# Patient Record
Sex: Female | Born: 1940 | Hispanic: No | Smoking: Never smoker
Health system: Southern US, Community
[De-identification: ages and names within clinical notes are randomized; demographics above are authoritative.]

## PROBLEM LIST (undated history)

## (undated) DIAGNOSIS — I480 Paroxysmal atrial fibrillation: Secondary | ICD-10-CM

## (undated) DIAGNOSIS — Z9289 Personal history of other medical treatment: Secondary | ICD-10-CM

## (undated) DIAGNOSIS — N6019 Diffuse cystic mastopathy of unspecified breast: Secondary | ICD-10-CM

## (undated) DIAGNOSIS — R519 Headache, unspecified: Secondary | ICD-10-CM

## (undated) DIAGNOSIS — E785 Hyperlipidemia, unspecified: Secondary | ICD-10-CM

## (undated) DIAGNOSIS — F329 Major depressive disorder, single episode, unspecified: Secondary | ICD-10-CM

## (undated) DIAGNOSIS — I10 Essential (primary) hypertension: Secondary | ICD-10-CM

## (undated) DIAGNOSIS — I639 Cerebral infarction, unspecified: Secondary | ICD-10-CM

## (undated) DIAGNOSIS — M720 Palmar fascial fibromatosis [Dupuytren]: Secondary | ICD-10-CM

## (undated) DIAGNOSIS — T8859XA Other complications of anesthesia, initial encounter: Secondary | ICD-10-CM

## (undated) DIAGNOSIS — M1990 Unspecified osteoarthritis, unspecified site: Secondary | ICD-10-CM

## (undated) DIAGNOSIS — C801 Malignant (primary) neoplasm, unspecified: Secondary | ICD-10-CM

## (undated) DIAGNOSIS — R51 Headache: Secondary | ICD-10-CM

## (undated) DIAGNOSIS — N760 Acute vaginitis: Secondary | ICD-10-CM

## (undated) DIAGNOSIS — E538 Deficiency of other specified B group vitamins: Secondary | ICD-10-CM

## (undated) DIAGNOSIS — D649 Anemia, unspecified: Secondary | ICD-10-CM

## (undated) DIAGNOSIS — M109 Gout, unspecified: Secondary | ICD-10-CM

## (undated) DIAGNOSIS — F1010 Alcohol abuse, uncomplicated: Secondary | ICD-10-CM

## (undated) DIAGNOSIS — S72002A Fracture of unspecified part of neck of left femur, initial encounter for closed fracture: Secondary | ICD-10-CM

## (undated) DIAGNOSIS — K219 Gastro-esophageal reflux disease without esophagitis: Secondary | ICD-10-CM

## (undated) DIAGNOSIS — T4145XA Adverse effect of unspecified anesthetic, initial encounter: Secondary | ICD-10-CM

## (undated) DIAGNOSIS — F32A Depression, unspecified: Secondary | ICD-10-CM

## (undated) DIAGNOSIS — B9689 Other specified bacterial agents as the cause of diseases classified elsewhere: Secondary | ICD-10-CM

## (undated) DIAGNOSIS — E559 Vitamin D deficiency, unspecified: Secondary | ICD-10-CM

## (undated) DIAGNOSIS — S92301A Fracture of unspecified metatarsal bone(s), right foot, initial encounter for closed fracture: Secondary | ICD-10-CM

## (undated) DIAGNOSIS — N200 Calculus of kidney: Secondary | ICD-10-CM

## (undated) HISTORY — DX: Calculus of kidney: N20.0

## (undated) HISTORY — DX: Alcohol abuse, uncomplicated: F10.10

## (undated) HISTORY — DX: Other specified bacterial agents as the cause of diseases classified elsewhere: N76.0

## (undated) HISTORY — DX: Major depressive disorder, single episode, unspecified: F32.9

## (undated) HISTORY — DX: Palmar fascial fibromatosis (dupuytren): M72.0

## (undated) HISTORY — DX: Depression, unspecified: F32.A

## (undated) HISTORY — PX: COLONOSCOPY: SHX174

## (undated) HISTORY — DX: Fracture of unspecified part of neck of left femur, initial encounter for closed fracture: S72.002A

## (undated) HISTORY — DX: Gastro-esophageal reflux disease without esophagitis: K21.9

## (undated) HISTORY — PX: ABDOMINAL HYSTERECTOMY: SHX81

## (undated) HISTORY — PX: OTHER SURGICAL HISTORY: SHX169

## (undated) HISTORY — DX: Hyperlipidemia, unspecified: E78.5

## (undated) HISTORY — DX: Fracture of unspecified metatarsal bone(s), right foot, initial encounter for closed fracture: S92.301A

## (undated) HISTORY — DX: Cerebral infarction, unspecified: I63.9

## (undated) HISTORY — DX: Paroxysmal atrial fibrillation: I48.0

## (undated) HISTORY — DX: Diffuse cystic mastopathy of unspecified breast: N60.19

## (undated) HISTORY — DX: Deficiency of other specified B group vitamins: E53.8

## (undated) HISTORY — DX: Vitamin D deficiency, unspecified: E55.9

## (undated) HISTORY — PX: LEG SURGERY: SHX1003

## (undated) HISTORY — DX: Other specified bacterial agents as the cause of diseases classified elsewhere: B96.89

## (undated) HISTORY — PX: LITHOTRIPSY: SUR834

---

## 1998-08-10 ENCOUNTER — Observation Stay (HOSPITAL_COMMUNITY): Admission: RE | Admit: 1998-08-10 | Discharge: 1998-08-11 | Payer: Self-pay | Admitting: Orthopedic Surgery

## 2001-01-09 ENCOUNTER — Encounter: Admission: RE | Admit: 2001-01-09 | Discharge: 2001-01-09 | Payer: Self-pay | Admitting: Internal Medicine

## 2001-01-09 ENCOUNTER — Encounter: Payer: Self-pay | Admitting: Internal Medicine

## 2002-01-07 ENCOUNTER — Encounter: Payer: Self-pay | Admitting: Urology

## 2002-01-07 ENCOUNTER — Encounter: Admission: RE | Admit: 2002-01-07 | Discharge: 2002-01-07 | Payer: Self-pay | Admitting: Urology

## 2002-01-13 ENCOUNTER — Encounter: Payer: Self-pay | Admitting: Urology

## 2002-01-13 ENCOUNTER — Encounter: Admission: RE | Admit: 2002-01-13 | Discharge: 2002-01-13 | Payer: Self-pay | Admitting: Urology

## 2002-03-07 ENCOUNTER — Encounter: Payer: Self-pay | Admitting: Urology

## 2002-03-10 ENCOUNTER — Ambulatory Visit (HOSPITAL_COMMUNITY): Admission: RE | Admit: 2002-03-10 | Discharge: 2002-03-10 | Payer: Self-pay | Admitting: Urology

## 2002-08-11 ENCOUNTER — Emergency Department (HOSPITAL_COMMUNITY): Admission: EM | Admit: 2002-08-11 | Discharge: 2002-08-11 | Payer: Self-pay | Admitting: Emergency Medicine

## 2002-11-10 ENCOUNTER — Emergency Department (HOSPITAL_COMMUNITY): Admission: EM | Admit: 2002-11-10 | Discharge: 2002-11-10 | Payer: Self-pay | Admitting: Emergency Medicine

## 2003-01-15 ENCOUNTER — Inpatient Hospital Stay (HOSPITAL_COMMUNITY): Admission: EM | Admit: 2003-01-15 | Discharge: 2003-01-23 | Payer: Self-pay | Admitting: Psychiatry

## 2003-04-13 ENCOUNTER — Encounter: Admission: RE | Admit: 2003-04-13 | Discharge: 2003-04-13 | Payer: Self-pay | Admitting: Internal Medicine

## 2003-04-13 ENCOUNTER — Encounter: Payer: Self-pay | Admitting: Internal Medicine

## 2004-10-24 ENCOUNTER — Emergency Department (HOSPITAL_COMMUNITY): Admission: EM | Admit: 2004-10-24 | Discharge: 2004-10-24 | Payer: Self-pay | Admitting: Emergency Medicine

## 2004-11-01 ENCOUNTER — Ambulatory Visit (HOSPITAL_COMMUNITY): Admission: AD | Admit: 2004-11-01 | Discharge: 2004-11-03 | Payer: Self-pay | Admitting: Orthopaedic Surgery

## 2005-09-14 DIAGNOSIS — I639 Cerebral infarction, unspecified: Secondary | ICD-10-CM

## 2005-09-14 HISTORY — DX: Cerebral infarction, unspecified: I63.9

## 2005-10-04 ENCOUNTER — Encounter: Admission: RE | Admit: 2005-10-04 | Discharge: 2005-10-04 | Payer: Self-pay | Admitting: Internal Medicine

## 2005-11-07 ENCOUNTER — Inpatient Hospital Stay (HOSPITAL_COMMUNITY): Admission: EM | Admit: 2005-11-07 | Discharge: 2005-11-13 | Payer: Self-pay | Admitting: Emergency Medicine

## 2005-12-05 ENCOUNTER — Inpatient Hospital Stay (HOSPITAL_COMMUNITY): Admission: RE | Admit: 2005-12-05 | Discharge: 2005-12-12 | Payer: Self-pay | Admitting: Orthopedic Surgery

## 2005-12-14 ENCOUNTER — Ambulatory Visit: Payer: Self-pay | Admitting: Internal Medicine

## 2005-12-21 ENCOUNTER — Inpatient Hospital Stay (HOSPITAL_COMMUNITY): Admission: EM | Admit: 2005-12-21 | Discharge: 2005-12-29 | Payer: Self-pay | Admitting: Emergency Medicine

## 2006-03-16 ENCOUNTER — Ambulatory Visit (HOSPITAL_COMMUNITY): Admission: RE | Admit: 2006-03-16 | Discharge: 2006-03-16 | Payer: Self-pay | Admitting: Orthopedic Surgery

## 2006-04-19 ENCOUNTER — Emergency Department (HOSPITAL_COMMUNITY): Admission: EM | Admit: 2006-04-19 | Discharge: 2006-04-19 | Payer: Self-pay | Admitting: Emergency Medicine

## 2007-06-21 ENCOUNTER — Observation Stay (HOSPITAL_COMMUNITY): Admission: EM | Admit: 2007-06-21 | Discharge: 2007-06-21 | Payer: Self-pay | Admitting: Emergency Medicine

## 2008-07-20 ENCOUNTER — Encounter: Admission: RE | Admit: 2008-07-20 | Discharge: 2008-07-20 | Payer: Self-pay | Admitting: Gastroenterology

## 2008-08-19 ENCOUNTER — Ambulatory Visit (HOSPITAL_COMMUNITY): Admission: RE | Admit: 2008-08-19 | Discharge: 2008-08-19 | Payer: Self-pay | Admitting: Gastroenterology

## 2010-10-17 ENCOUNTER — Other Ambulatory Visit: Payer: Self-pay | Admitting: Internal Medicine

## 2010-10-17 DIAGNOSIS — N200 Calculus of kidney: Secondary | ICD-10-CM

## 2010-10-19 ENCOUNTER — Ambulatory Visit
Admission: RE | Admit: 2010-10-19 | Discharge: 2010-10-19 | Disposition: A | Discharge status: Home / Self Care | Payer: Medicare Other | Source: Ambulatory Visit | Attending: Internal Medicine | Admitting: Internal Medicine

## 2010-10-19 DIAGNOSIS — N200 Calculus of kidney: Secondary | ICD-10-CM

## 2010-12-27 NOTE — Op Note (Signed)
NAMERANELL, Paul NO.:  [id number]   MEDICAL RECORD NO.:  [id number]          PATIENT TYPE:  AMB   LOCATION:  ENDO                         FACILITY:  [facility]   PHYSICIAN:  Danise Edge, M.D.   DATE OF BIRTH:  1941/01/18   DATE OF PROCEDURE:  08/19/2008  DATE OF DISCHARGE:                               OPERATIVE REPORT   REFERRING PHYSICIAN:  Candyce Churn, MD   PROCEDURE INDICATION:  [name] is a 70 year old female, born  September 14, 1940.  [name] underwent a barium esophagram with tablet to  evaluate esophageal dysphagia.  Her x-ray shows a mild stricture at the  esophagogastric junction.   [name] has been treated for gastroesophageal reflux for approximately  4 years.  Her heartburn is controlled on a proton pump inhibitor, but  she does have breakthrough heartburn.   In 1998 and in 2002, her upper GI x-ray series were normal.   For approximately 4 months, she has experienced intermittent solid food  esophageal dysphagia unassociated with symptoms of aspiration,  odynophagia, or hoarseness.  She has not lost weight.  She does not  smoke cigarettes.   MEDICATION ALLERGIES:  None.   CHRONIC MEDICATIONS:  B complex vitamins, Lotrel, Protonix, and Lunesta.   PAST MEDICAL AND SURGICAL HISTORY:  1. Hypertension.  2. Chronic alcohol use.  3. Kidney stones.  4. Chronic insomnia.  5. Paroxysmal atrial fibrillation.  6. Gastroesophageal reflux.   FAMILY HISTORY:  Negative for colon cancer.   ENDOSCOPIST:  Danise Edge, MD   PREMEDICATION:  Fentanyl 50 mcg and Versed 5 mg.   PROCEDURE:  Esophagogastroduodenoscopy with Savary esophageal dilation.   After obtaining informed consent, [name] was placed in the left  lateral decubitus position.  I administered intravenous fentanyl and  intravenous Versed to achieve conscious sedation for the procedure.  The  patient's blood pressure, oxygen saturation, and cardiac rhythm were  monitored throughout the procedure and documented in the medical record.   The Pentax gastroscope was passed through the posterior hypopharynx into  the proximal esophagus without difficulty.  The hypopharynx, larynx, and  vocal cords appeared normal.   Esophagoscopy:  The proximal, mid, and lower segments of the esophageal  mucosa appeared normal except for the presence of a benign-appearing  peptic stricture at the esophagogastric junction.  There is no  endoscopic evidence for the presence of esophageal cancer, erosive  esophagitis, or Barrett esophagus.   Gastroscopy:  The patient has a moderate-sized hiatal hernia.  Retroflexed view of the gastric cardia and fundus was otherwise normal.  The gastric body, antrum, and pylorus appeared normal.   Duodenoscopy:  The duodenal bulb and descending duodenum appeared  normal.   Savary esophageal dilation:  The Savary dilator wire was passed through  the Pentax gastroscope and the tip of the guidewire advanced to the  distal gastric antrum as confirmed endoscopically.  Fluoroscopy was not  required to perform esophageal dilation.  The 15-mm Savary dilator  passed without resistance.  Repeat esophagogastroscopy confirmed  satisfactory dilation of the benign peptic stricture at the  esophagogastric junction and no endoscopic evidence of trauma to the  stomach due to the guidewire.   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  No  endoscopic evidence for the presence of erosive esophagitis or Barrett  esophagus.   RECOMMENDATIONS:  Continue proton pump inhibitor therapy to prevent  heartburn.  Repeat esophageal dilation as needed in the future.            ______________________________  Danise Edge, M.D.     MJ/MEDQ  D:  08/19/2008  T:  08/19/2008  Job:  [id number]   cc:    Candyce Churn, M.D.

## 2010-12-27 NOTE — H&P (Signed)
NAMEJORDON, [name] NO.:  [id number]   MEDICAL RECORD NO.:  [id number]          PATIENT TYPE:  EMS   LOCATION:  MAJO                         FACILITY:  [facility]   PHYSICIAN:  Candyce Churn, M.D.DATE OF BIRTH:  1941/02/21   DATE OF ADMISSION:  06/21/2007  DATE OF DISCHARGE:                              HISTORY & PHYSICAL   PRIMARY CARE PHYSICIAN:  Candyce Churn, M.D.   CHIEF COMPLAINT:  Chest pain.   HISTORY OF PRESENT ILLNESS:  The patient is a 70 year old African  American female with past medical history of hypertension, alcohol  abuse, and anxiety, who presents to the emergency room after having 2  days' worth of chest pain.  She says that she has been previously well,  and then starting 2 days ago she started having intermittent episodes of  a chest pressure described as across her chest.  It also made her feel  short of breath.  She described it as a heavy pressure, and again it was  intermittent.  There was no other radiation, and she had no other  symptoms.  She finally became concerned and came into the emergency room  for further evaluation.  In the emergency room, she was transported via  paramedics.  It was unclear what she received via paramedics, although  one would assume it would be at least nitroglycerin and possibly  aspirin.  In the emergency room, she received Zofran and Ativan.  She  said that after she received the medication her chest pain resolved.  She was not sure of which medicine or whether it was in the emergency  room or whether it was in the ambulance.  Currently, she is feeling  okay.  She denies any headaches, vision changes, dysphagia, chest pain,  palpitations, shortness of breath, wheeze, cough, abdominal pain,  hematuria, dysuria, constipation, diarrhea, focal extremity numbness,  weakness, or pain.   REVIEW OF SYSTEMS:  Otherwise negative.   PAST MEDICAL HISTORY:  1. Hypertension.  2. Alcohol abuse.  3.  GERD.  4. Anxiety.  5. History of atrial fibrillation, paroxysmal.   MEDICATIONS:  1. Robaxin 500 q.8 h.  2. Klonopin 1 p.o. at bedtime.  3. Ativan 0.5 p.o. q.8 h. p.r.n.  4. Lisinopril 20.  5. Prilosec 20.  6. Coumadin 2.5 p.o. daily.   She has no known drug allergies.   SOCIAL HISTORY:  She denies any tobacco abuse.  She downplays her  alcohol abuse and says she drinks about 2 beers a day, but according to  her PCP she drinks more than that.  She denies any drug use.   FAMILY HISTORY:  Noncontributory.   VITAL SIGNS ON ADMISSION:  Temperature 98, heart rate 74, blood pressure  127/76, respirations 14, O2 saturations 98% on room air.   Chest x-ray shows no evidence of any acute disease.  Note her INR is  subtherapeutic and is at 1.  Sodium 122, potassium 3.6, chloride 91,  bicarbonate 25, BUN 7, creatinine 0.8, glucose 150.  H&H 15.6 and 46.  D-  dimer 0.35.  CPK 180, MB 1.7,  troponin I less than 0.05.  Second set  showed a slight elevation in her CPK level at 207, but a normal MB and  troponin.  The patient's EKG shows normal sinus rhythm, with an  incomplete right bundle branch block.  When compared to a previous EKG,  this bundle branch block is present.  There is some slight  differentiation, but this bundle branch block is present on previous  EKG.   ASSESSMENT AND PLAN:  1. Chest pain.  It is difficult to say whether or not this is truly      cardiac in nature.  She does have some risk factors including age,      hypertension, and indirectly alcohol abuse.  So, we will plan to      admit the patient, check 2 more sets of cardiac markers.  Depending      on how she is feeling and the recurrence of symptoms, she may get a      stress test inpatient versus outpatient.  2. Hyponatremia.  We will gently hydrate the patient and continue to      follow.  3. History of alcohol abuse.  Risk for possible withdrawal.  She says      her last drink was approximately 2 days ago.   We will put her on      p.r.n. Ativan just in case she does start to withdraw.  4. History of atrial fibrillation.  We will check with Dr. Kevan Ny and      confirm whether or not she is truly still on Coumadin.  For now, we      will plan to start her back on Coumadin 2.5, although I am      concerned about the possibility of a patient on Coumadin who drinks      alcohol heavily.      Hollice Espy, M.D.   Electronically Signed     ______________________________  Candyce Churn, M.D.    SKK/MEDQ  D:  06/21/2007  T:  06/21/2007  Job:  [id number]   cc:   Candyce Churn, M.D.

## 2010-12-30 NOTE — Discharge Summary (Signed)
NAME:  [name], [name]                           ACCOUNT NO.:  [id number]   MEDICAL RECORD NO.:  [id number]                   PATIENT TYPE:  IPS   LOCATION:  0506                                 FACILITY:  [facility]   PHYSICIAN:  Geoffery Lyons, M.D.                   DATE OF BIRTH:  November 11, 1940   DATE OF ADMISSION:  01/15/2003  DATE OF DISCHARGE:  01/23/2003                                 DISCHARGE SUMMARY   CHIEF COMPLAINT AND PRESENT ILLNESS:  This was the first admission to [facility] for this 70 year old married African-American  female, voluntarily admitted.  She had a history of alcohol abuse, drinking  whiskey and wine every day.  Her husband shared a fifth that would last  about four days.  Her husband drank as well.  She drank a glass of wine at  night.  She had been drinking for the past eight to 10 years.  Longest  history of sobriety was two weeks.  She normally slept well, decreased  appetite, 20 pound weight loss but denied depression and anxiety.   PAST PSYCHIATRIC HISTORY:  This was the first time at [facility]; no previous treatment.   SUBSTANCE ABUSE HISTORY:  She had been having tremors in the morning, so it  got that she would have some before she saw her primary care Kaylen Motl.   PAST MEDICAL HISTORY:  Hypertension.   MEDICATIONS:  Prinzide 20/25 mg daily.   PHYSICAL EXAMINATION:  Physical examination was performed, failed to show  any acute findings.   MENTAL STATUS EXAM:  Mental status exam revealed an alert, thin, middle-aged  female, cooperative, good eye contact.  Speech was clear.  Mood was  euthymic.  She appeared flat.  Thought processes were Coherent; no evidence  of psychosis, did not appear to be responding to internal stimuli.  Cognitive: Cognition was well preserved.   ADMISSION DIAGNOSES:   AXIS I:  Alcohol dependence.   AXIS II:  No diagnosis.   AXIS III:  1. Alcohol-induced hepatitis.  2.  Hypertension.   AXIS IV:  Moderate.   AXIS V:  Global assessment of functioning upon admission 35, highest global  assessment of functioning in the last year 60.   LABORATORY DATA:  Other laboratory workup: Blood chemistries: SGOT 123, SGPT  76, bilirubin 2.5.  Thyroid profile was within normal limits.  EKG was  within normal limits.   HOSPITAL COURSE:  She was admitted and started in intensive individual and  group psychotherapy.  She was detoxified with Librium.  She was maintained  on the Prinzide.  She was given Folate, Gatorade, soft diet.  She was given  Zyprexa 2.5 mg at night and it was later discontinued.  She was placed on  fall precautions.  She was given Carafate 1 g three times a day and at night  and Protonix  40 mg per day.  She was given a course of amoxicillin.  She  evidenced withdrawal, feeling dizzy, shaky.  She had some orthostatic  hypotension, feeling weak and nauseated, in bed.  She was basically  dehydrated.  Alcohol abuse for 14 years, hepatitis, ascites, microcytosis.  There was alcoholic hepatitis that started getting better.  She continued to  be detoxified.  Slowly, she was able to get up out of bed, she was able to  eat something in her stomach, the nausea got better, the tremors  disappeared.  She started stabilizing.  So, as she stabilized, we worked on  a relapse prevention plan.  She was going home.  The husband was willing to  stop drinking.  On June 11, she was in full contact with reality.  She was  receiving steady recovery from the alcohol withdrawal, some weakness but  much improved from admission, no active symptoms, no suicidal or homicidal  ideas.  She was discharged to outpatient followup.   DISCHARGE DIAGNOSES:   AXIS I:  Alcohol dependence.   AXIS II:  No diagnosis.   AXIS III:  1. Arterial hypertension.  2. Alcoholic hepatitis.   AXIS IV:  Moderate.   AXIS V:  Global assessment of functioning upon discharge 50.   DISCHARGE  MEDICATIONS:  1. Protonix 40 mg per day.  2. Amoxicillin 500 mg three times a day for five days.  3. Multivitamin.  4. Ambien for sleep.   FOLLOW UP:  She was to follow up with [facility].                                                 Geoffery Lyons, M.D.    IL/MEDQ  D:  02/18/2003  T:  02/19/2003  Job:  [id number]

## 2010-12-30 NOTE — Op Note (Signed)
[name], [name] NO.:  [id number]   MEDICAL RECORD NO.:  [id number]          PATIENT TYPE:  OIB   LOCATION:  2899                         FACILITY:  [facility]   PHYSICIAN:  Vanita Panda. Magnus Ivan, M.D.DATE OF BIRTH:  1941-01-12   DATE OF PROCEDURE:  11/01/2004  DATE OF DISCHARGE:                                 OPERATIVE REPORT   PREOPERATIVE DIAGNOSIS:  Left intra-articular comminuted distal radius  fracture.   POSTOPERATIVE DIAGNOSIS:  Left intra-articular comminuted distal radius  fracture.   PROCEDURE:  Open reduction internal fixation of left distal radius fracture  using AccuMed distal radius volar locking plate.   SURGEON:  Vanita Panda. Magnus Ivan, M.D.   ANESTHESIA:  General.   COMPLICATIONS:  None.   TOURNIQUET TIME:  One hour and 52 minutes.   BLOOD LOSS:  Minimal.   INDICATIONS FOR PROCEDURE:  Briefly, Ms. Wahlert is a 70 year old female who  sustained a mechanical fall one week ago on an outstretched left wrist. She  was seen in the emergency room and found to have a comminuted, intra-  articular distal radius fracture.  A reduction maneuver at the time had to  be performed to get it in more anatomical alignment.  She now comes for  definitive fixation.   DESCRIPTION OF PROCEDURE:  After informed consent was obtained, Ms. Routson was  brought to the operating room, placed supine upon the operating table.  General anesthesia was then obtained and her left arm was placed on a  radiolucent, arm table. A nonsterile tourniquet was placed on her upper arm.  Her arm was then prepped and draped with DuraPrep, sterile drapes.  An  Esmarch was used to wrap out the wrist, and then tourniquet was inflated to  250 mm of pressure.   A volar approach to the wrist was taken and knife was used to excise the  skin and the interval between the flexor carpi radialis and radial artery  was taken.  The soft tissues were divided and the artery was protected.   The  pronator quadratus was then encountered and teased off the bone from a  radial to ulnar direction. The fracture was exposed and was cleaned of  fracture debris.  The fracture was then manipulated into a reduced position,  and a standard, AccuMed left wrist distal radius and volar locking plate was  chosen and secured into place with K-wire fixation.  With the plate, the  fracture was found to be in a more anatomically reduced position, except for  the radiostyloid piece.   [place] allograft bone matrix was used for the comminution of bone loss in  the radiostyloid, placed to help get it out to length.  Once this was  accomplished, the plate was secured with distal row of locking screws and 2  radiostyloid locking screws.  Proximally, it was fixed with 2 locking screws  and 1 bicortical standard cortex screw. The wrist was put through a range of  motion and found to be stable.  Under radiographic guidance, it was assessed  as well, and found to have stable fixation,  and a neutral alignment on the  lateral film.  The wound was copiously irrigated and the deep tissue was  closed with interrupted 2-0 Vicryl suture, including reapproximating the  pronator quadratus. Skin was closed with interrupted 3-0 Prolene suture.  A  sterile dressing was then applied, followed by a well padded, short-arm  volar plaster splint. Tourniquet was let down at 1 hour and 52 minutes, and  the fingers had pinked nicely.  The patient was awakened, extubated, the  patient was taken to the recovery room in stable condition.      CYB/MEDQ  D:  11/01/2004  T:  11/01/2004  Job:  [id number]

## 2010-12-30 NOTE — Discharge Summary (Signed)
[name], [name]                 ACCOUNT NO.:  [id number]   MEDICAL RECORD NO.:  [id number]          PATIENT TYPE:  INP   LOCATION:  1321                         FACILITY:  [facility]   PHYSICIAN:  Harvie Junior, M.D.   DATE OF BIRTH:  04-03-1941   DATE OF ADMISSION:  11/06/2005  DATE OF DISCHARGE:                                 DISCHARGE SUMMARY   ADMISSION DIAGNOSES:  1.  Intrarticular displaced medial and lateral tibial plateau fracture, left      leg.  2.  History of ETOH usage.  3.  Hypertension.   DISCHARGE DIAGNOSES:  1.  Intrarticular displaced medial and lateral tibial plateau fracture, left      leg.  2.  History of ETOH usage.  3.  Hypertension.   CONSULTATIONS:  Internal medicine, Candyce Churn, M.D.   PROCEDURE:  Exploration of left knee hemarthrosis x2.   BRIEF HISTORY:  Ms. Searing is a 70 year old female who fell on November 06, 2005  and complained of pain in her left leg with swelling. She was unable to  weightbear. She was brought to [facility] where x-rays of the left  leg showed an intraarticular medial and lateral tibial plateau fracture. She  was admitted for treatment of this fracture. There is a note that the  patient lives alone, does drive and apparently fell over a rock. The patient  denied numbness or tingling in her left leg but did complain of significant  pain and inability to weightbear. She was admitted for treatment of her left  proximal tibia fracture.   PERTINENT LABORATORY AND X-RAY DATA:  CT scan of the left knee without  contrast showed comminuted tibial plateau fracture involving the medial and  lateral plateau. Chest x-ray showed a low volume chest film but no acute  pulmonary findings. Plain x-rays of the left tibia/fibula showed a tibial  plateau fracture communicating with a long vertical fracture of the proximal  tibial shaft which is nondisplaced. There is a small fibular head fracture  as well. EKG on admission  showed normal sinus rhythm with nonspecific ST  abnormality, no significant change compared with previous EKG. Hemoglobin on  admission was 13.9, hematocrit 41.7, WBC 17.0. Her pro time on admission was  13.4 seconds with an INR of 1.0. BMET showed low sodium at 134, potassium  was 4.4, glucose 165, calcium 8.1.   HOSPITAL COURSE:  The patient was admitted through the emergency room to the  floor where she was given IV pain medication and her leg was placed in a  dressing. She had significant swelling and had been aspirated in the  emergency room and 100 mL of blood was removed from the left knee joint. She  initially had some dorsiflexor weakness or she was unwilling to dorsiflex  her ankle on the left side. On November 07, 2005, she had stable laboratory  studies, she had a low grade fever of 99.1, her lungs were clear to  auscultation. She had decreased sensation over the dorsum of her foot but  she was able to dorsiflex the foot  actively at this point. The Kerlix wrap  was removed and we discussed treatment and care with Dr. Myrene Galas of  the orthopedic trauma service. On November 08, 2005, she had significant  complaints of left knee pain, she was resting in bed, she had numbness on  the dorsum of her foot which was improving. She had a fever of 100.8, her BP  was 163/97, she had a significant heme arthrosis of the knee and under  sterile conditions, this was aspirated and 70 mL were withdrawn from the  knee under sterile conditions to relieve her pain. Dr. Johnella Moloney  consulted on the patient for medical concerns as being hypertension and  history of ETOH abuse. He ordered her some p.r.n. ativan and continued on  Klonopin which he takes on a chronic basis. The dressing was changed and she  was noted to have  humongously large fracture blisters circumferentially  around the mid lower leg area secondary to swelling in her fracture. She had  good NV status distally. It was felt based  upon the significant fracture  blister that she was not a surgical candidate at this point. Because of  significant concern for wound problems postoperatively, a wound management  and nurse consult was obtained and Mepilex was ordered, placed against the  blistered area and then the orthopedic tech placed the tech in a long leg  fiberglass posterior splint. The patient lives alone and has no one to care  for her at home and therefore was in need of skilled nursing facility  placement.   CONDITION ON DISCHARGE:  Improved.   DIET:  Regular.   DISCHARGE MEDICATIONS:  1.  Lovenox 30 mg subcu q.12 h.  2.  Klonopin 1 mg p.o. q.h.s.  3.  Hydrochlorothiazide 25 mg 1 p.o. q.a.m.  4.  Lisinopril 20 mg 1 p.o. q.a.m.  5.  Tylenol 650 mg 1 q.4 h p.r.n. temperature greater than 101.  6.  Ativan 0.5 mg p.o. q.8 h p.r.n. anxiety.  7.  Robaxin 500 mg p.o. q.6 h p.r.n. spasm.  8.  Percocet 1-2 tablets p.o. q.3-4 h p.r.n. pain.   ACTIVITY:  Nonweightbearing on the left. She will need a dressing change of  her left leg dressing every 3 days but will leave the Mepilex dressing  against her skin. She will continue in a long leg posterior splint. She will  need to followup with Dr. Myrene Galas at [facility] on  November 22, 2005 and his office will need to be called to make this  appointment. She was discharged from [facility] to a skilled  nursing facility in the [facility] area.      Marshia Ly, P.A.      Harvie Junior, M.D.  Electronically Signed    JB/MEDQ  D:  11/10/2005  T:  11/13/2005  Job:  [id number]   cc:   Candyce Churn, M.D.  Fax: [phone]   Doralee Albino. Carola Frost, M.D.  Fax: [phone]

## 2010-12-30 NOTE — H&P (Signed)
NAMEJOANMARIE, [name]                 ACCOUNT NO.:  [id number]   MEDICAL RECORD NO.:  [id number]          PATIENT TYPE:  INP   LOCATION:  5020                         FACILITY:  [facility]   PHYSICIAN:  Doralee Albino. Carola Frost, M.D. DATE OF BIRTH:  1941-07-01   DATE OF ADMISSION:  12/05/2005  DATE OF DISCHARGE:  12/12/2005                                HISTORY & PHYSICAL   ADMISSION DIAGNOSES:  1.  Left bicondylar tibial plateau fracture.  2.  Left tibial shaft fracture.   DISCHARGE DIAGNOSES:  1.  Left bicondylar tibial plateau fracture.  2.  Left tibial shaft fracture.   OPERATIONS AND PROCEDURES:  December 05, 2005, open reduction and internal  fixation left bicondylar tibial plateau and tibial shaft fracture.   CONSULTATIONS:  None.   BRIEF HISTORY:  The patient is a 70 year old black female with history of  alcohol abuse who fell down in her yard on November 13, 2005, sustaining  immediate pain, inability to ambulate.  She was found to have a tibial  plateau bicondylar and tibial shaft fracture. She was stabilized, developed  significant post fracture blistering.  When her skin had healed and was safe  for surgical intervention, she underwent open reduction and internal  fixation of these fractures on December 05, 2005, by Dr. Myrene Galas.  She  was admitted postoperatively for pain control.   PERTINENT LABORATORY DATA:  Chest x-ray showed no active disease, mild  chronic interstitial changes with COPD.   Postoperative films show satisfactory positional alignment of the left  tibial plateau and shaft fracture.   WBC 6.1, hemoglobin 11.6, hematocrit 34.8 at time of admission.  At time of  discharge, WBC was 9.3, hemoglobin 10.7, hematocrit 31.1, platelet count  236,000.  She did have a hemoglobin of 7.8 on April 27, transfused 2 units  packed red blood cells.  Then post transfusion, her hemoglobin was 10.7.  Serial PTs were taken because of anticoagulation with Coumadin, started at  13.2  and INR 1.  At time of discharge, her INR was 2.7, therapeutic on  Coumadin 5 mg dosing per day.  Her chemistries were within normal limits.  Her potassium did drop to 3.  She was given KCl p.o. supplementation, and  post supplementation her potassium was 4.1 at the time of discharge.   HOSPITAL COURSE:  The patient was admitted December 05, 2005, post open  reduction and internal fixation of her left tibial plateau bicondylar and  tibial shaft fracture. She was placed on PCA Dilaudid pump.  Her home  medications were restarted including hydrochlorothiazide, Lisinopril,  Klonopin, Robaxin, Percocet, Tylenol, and Lexapro.  She had been on Lovenox  preoperatively.  She was placed on Coumadin postoperatively, her protocol  per pharmacy.   First day postoperatively, the patient was comfortable.  Vital signs were  stable. She was eating well.  Foley catheter was in place.  Plans were made  to discontinue Foley, wean off PCA pump to p.o. pain medications, and get  her off her IV, continue on Coumadin therapy. She was not yet therapeutic.  She progressed with therapy.  She was insisting on being discharged home and  refused to go to the skilled nursing facility during her postoperative  course.   Second day postoperatively, she developed a dry cough. Chest x-ray was  repeated to rule out pneumonia or infiltrate, and none were found.  Her  cough improved with Mucinex and Robitussin cough medicine.  Discharge  planning continued.  Physical therapy and occupational therapy felt she was  not safe for discharge home as she did fall during her hospitalization and  twisted her ankle.  Repeat films were taken of the left tibia at the level  of the knee where her surgery was done as well as her left ankle.  No new  fractures, change in positional alignment of hardware, or infection were  found.  However, she did have mild sprain to the ankle on the lateral side.  A long discussion was had with the patient  and clinical social workers.  It  was felt that she would be safer to go to skilled nursing facility.  I think  the patient ended up agreeing with this, and arrangements were made for  discharge to skilled nursing facility on Dec 12, 2005.  She was eating well,  voiding well, ambulating nonweightbearing left lower extremity with a walker  range-of-motion brace.  All wound benign.  She was neurovascularly intact  throughout her hospitalization.  Labs within normal limits.  At time of  discharge, INR was therapeutic on Coumadin.   CONDITION ON DISCHARGE:  Improved and stable.   DISCHARGE INSTRUCTIONS:  1.  She is to continue on Coumadin 5 mg p.o. daily.  2.  Continue with weekly pro times and INR to monitor her Coumadin progress.  3.  She is to continue on Klonopin 2 mg p.o. nightly p.r.n.  4.  Hydrochlorothiazide 25 mg p.o. daily.  5.  Lisinopril 20 mg p.o. daily.  6.  Protonix 40mg  p.o. daily.  7.  Robaxin 500 mg p.o. q. 6-8 h p.r.n. spasm.  8.  She can also continue on her home dose of Ativan 45 mg p.o. q. 8 h      p.r.n.  9.  Percocet 1 to 2 p.o. q. 4 h p.r.n. pain. She can wean off Percocet to 1      Percocet instead of 2 every 4 to 6 hours p.r.n. pain.  10. She is to have physical therapy for nonweightbearing ambulation left      lower extremity with a walker.  11. She is to work on range of motion of the ankle and knee.  No limits on      her motion, but she is to wear a range-of-motion brace or support.  12. She will follow up in our office in 7 to 10 days for repeat x-rays,      wound check, and to get her stitches out.  Contact Dr. Jarold Song      office prior followup if she has any questions or concerns.   During her hospitalization, her vital signs were stable, temperature maximum  of 100.4. At time of discharge, it was 98.5.  Ambulating well at time of  discharge.      [name].      Doralee Albino. Carola Frost, M.D. Electronically Signed    SCI/MEDQ  D:   12/11/2005  T:  12/11/2005  Job:  [id number]

## 2010-12-30 NOTE — Op Note (Signed)
NAMEGABRIAL, POPPELL                 ACCOUNT NO.:  [id number]   MEDICAL RECORD NO.:  [id number]          PATIENT TYPE:  INP   LOCATION:  5020                         FACILITY:  [facility]   PHYSICIAN:  Doralee Albino. Carola Frost, M.D. DATE OF BIRTH:  [birth year]   DATE OF PROCEDURE:  12/05/2005  DATE OF DISCHARGE:                                 OPERATIVE REPORT   PREOPERATIVE DIAGNOSES:  1.  Left bicondylar tibial plateau fracture.  2.  Left tibial shaft fracture.   POSTOPERATIVE DIAGNOSES:  1.  Left bicondylar tibial plateau fracture.  2.  Left tibial shaft fracture.   PROCEDURES:  1.  ORIF of left bicondylar tibial plateau fracture.  2.  ORIF of left tibia shaft fracture.   SURGEON:  Dr. Myrene Galas.   ASSISTANT:  Aura Fey. Dennison Bulla, PA-C.   ANESTHESIA:  General.   COMPLICATIONS:  None.   TOURNIQUET:  An hour and 30 minutes.   DRAINS:  One medium Hemovac anterior compartment.   ESTIMATED BLOOD LOSS:  100 mL.   DISPOSITION:  PACU.   CONDITION:  Stable.   BRIEF SUMMARY OF INDICATIONS FOR PROCEDURES:  Ms. Oconnell sustained a severe  bicondylar tibial plateau fracture and associated shaft fractures. Initially  seen, evaluated and managed by Dr. Milly Jakob. She developed extensive  fracture blisters and bulla and underwent prolonged treatment waiting for  soft tissue swelling resolution.  We did discuss the risk and benefits of  surgery including the possibility of infection, nerve injury, vessel injury,  malunion, nonunion, decreased range of motion and stability and need for  further surgery as well as the other perioperative complications such as  DVT, PE, stroke, heart attack and after full discussion she wished to  proceed with internal fixation of her fractures. She was on Lovenox  preoperatively for DVT prophylaxis.   DESCRIPTION OF PROCEDURE:  Ms. Defrank was taken to the operating room and  after administration of preoperative antibiotics, the left lower extremity  was prepped  and draped in the usual sterile fashion.  The fluoro machine was  brought in, AP and lateral images were obtained of her uninjured extremity  as a comparison. We then began making a medial approach through the standard  posterior medial interval. A branch of the saphenous nerve was identified  and retracted. We left the periosteal attachments intact but then used a  osteotome to identify the base of the fracture. We then packed this and  turned our attention laterally where we initiated the anterolateral  approach.  The patient was oozing from many surfaces and we felt at this  point it would be most prudent to exsanguinate the extremity with an Esmarch  bandage and go ahead and inflate the tourniquet.  This was performed and  then we proceeded with anterolateral exposure. We did a submeniscal  arthrotomy incising along the coronary ligament after first dividing the IT  band and tagging the edges of this in the retinaculum for closure. Prolene  suture was used to insert into the lateral edge of the meniscus and then a  Freer to elevate it.  Surprisingly there was no significant damage to the  meniscus and the fracture site could be visualized with depression of the  anterolateral segment.  This was pried open with a Freer and the osteotome  used to complete exit of the anterolateral fragment out of the lateral  cortex.  This was elevated in such a way that we could visualize into the  joint much more clearly and irrigation of the surface was performed, no  major chondral lesions were identified other than the fractured tibial  surfaces. The K-wires placed subchondrally were used to assist with  distraction as well.  We then took the osteotome and essentially performed  an intra-articular osteotomy in order to mobilize the anteriorly sloped  depressed medial compartment. We used then placed the lamina spreader into  the medial defect in order to restore appropriate joint height and   angulation with appropriate alignment at the knee and of the tibial shaft.  The tibial shaft fracture had mild angulation and we were able to use the  Cobb to elevate the periosteum down the lateral aspect of the tibia and then  passed a long 3.5  plate underneath the soft tissue envelope. This was  pinned provisionally to maintain appropriate position and alignment and then  a standard 3.5 screw used to lag the plate down to bone.  After maximal  compression of the plate against the bone, we then placed a locked guide to  proceed to place the other three subchondral screws.  We then exchanged the  standard screw anteriorly for another locked one. Prior to placing the more  posterior screws in the plate, we did use the large sharp tenaculum to  compress the anterolateral  fragment against the bulk of the lateral  subchondral segment.  The reduction remained appropriate at the joint level.  We then distally secured the shaft fracture by placing two more proximal  standard 3.5 cortical screws and then do an open incision. Distally we  placed one standard screw in the penultimate hole and then in the ultimate  hole a locked screw. We did drill for another locked screw proximal to this  but felt that if she did come to total knee replacement in the future and  would require this plate to be removed, I did not want three holes  consecutively at that level which could increase her risk of fracture in the  future and consequently this hole was left open so that it could grow over.  We then placed additional locked fixation in the proximal aspect of the  distal shaft segment and then more locked fixation in the proximal shaft  segment.  This maintained excellent reduction on the AP and lateral images  of the shaft. This left quite a large defect medially and this required  filling with calcium phosphate cement. The 10 mL was not enough, an additional 5 mL had to be used. The soft tissues were closed  over this area  medially and standard layered closure performed again being careful of the  saphenous nerve branch. We began the anterolateral closure as well and then  deflated the tourniquet after the cement had been given enough time to  solidify. Final AP and lateral images showed appropriate reduction both of  the joint and shaft with proper level alignment. The knee was stable to  examination both in full extension and at 30 degrees of flexion. The layered  closure proceeded with #1 for the retinaculum and IT band. A Prolene  imbrication suture for repair of the of coronary ligament and #0 Vicryl for  a very loose closure of the anterior compartment with again a medium Hemovac  drain placed into this and then 2-0 Vicryl and staples for the skin.  A  sterile gently compressive dressing and a knee immobilizer were applied.  The patient was awakened from anesthesia and transported to PACU in stable  condition.   PROGNOSIS:  Ms. Blanchet should do fairly well following repair of this  fracture.  Her limb alignment has been restored. Her knee is stable  ligamentously and the menisci both have been preserved as well. She is at  increased risk for complications given her history of alcohol abuse and this  may significantly increase the risk of noncompliance. The calcium phosphate  cement and strength of the construct may provide for some assistance with  regard to  complications secondary noncompliance but certainly cannot protect [place]  entirely. I have discussed this with the family who is aware.  She will be  placed on Coumadin for DVT prophylaxis and will change her into a hinge  brace to allow for unrestricted range of motion in two days.      Doralee Albino. Carola Frost, M.D.  Electronically Signed     MHH/MEDQ  D:  12/05/2005  T:  12/06/2005  Job:  [id number]

## 2010-12-30 NOTE — H&P (Signed)
NAME:  [name], [name]                           ACCOUNT NO.:  [id number]   MEDICAL RECORD NO.:  [id number]                   PATIENT TYPE:  IPS   LOCATION:  0506                                 FACILITY:  [facility]   PHYSICIAN:  Jeanice Lim, M.D.              DATE OF BIRTH:  08/27/1940   DATE OF ADMISSION:  01/15/2003  DATE OF DISCHARGE:                         PSYCHIATRIC ADMISSION ASSESSMENT   IDENTIFYING INFORMATION:  A 70 year old married African-American female,  voluntarily admitted on January 15, 2003.   HISTORY OF PRESENT ILLNESS:  The patient presents with a history of alcohol  abuse, has been drinking whiskey and wine every day, states she and her  husband share a fifth that will last about 4 days.  She states her husband  drinks as well.  She states she also drinks a glass of wine at night.  She  has been drinking for the past 8-10 years and states that she first started  drinking when she was 70 years of age.  Her longest history of sobriety has  been 2 weeks.  That was in 2003.  She feels uncertain if alcohol is a  problem for her.  She states she normally sleeps well.  She has had a  decreased appetite with a 20 pound weight loss.  She denies any depression,  anxiety, suicidal or homicidal ideation or psychosis and denies any specific  stressors.   PAST PSYCHIATRIC HISTORY:  First admission to [facility], no  other psychiatric admissions.  No history of detox, no outpatient treatment.   SOCIAL HISTORY:  She is a 70 year old married African-American female,  married for 10 years, second marriage.  She has 5 children.  She lives with  her husband.  She works as a Lawyer, cares for an elderly lady.  No legal  problems.  Completed her GED.   FAMILY HISTORY:  Son attempted suicide.  There are no alcohol problems in  the family.   ALCOHOL DRUG HISTORY:  Nonsmoker, does not drink in the morning, no  blackouts, no seizures, no substance abuse.  The patient did have  a drink  prior to seeing her primary care Jakeia Carreras day of admission as she was having  some tremors and reports that her primary care Aroush Chasse has often mentioned  her trying to stop drinking in the past, prescribing Librium to help her  relapsing from alcohol.   PAST MEDICAL HISTORY:  Primary care Jatavian Calica is Dr. Lyna Poser, phone  number [phone].  Medical problems are hypertension.   MEDICATIONS:  Prinzide 20/25 mg daily.   DRUG ALLERGIES:  No known allergies.   PHYSICAL EXAMINATION:  Done by Dr. Kevan Ny with no significant findings.  The  patient's vital signs today:  98.2, 87 heart rate, blood pressure 150/90.  She is 116 pounds.  She is 5 feet 9 inches tall.   LABORATORY DATA:  CMET:  Total bilirubin is  elevated at 2.5, SGOT is  elevated at 123, SGPT is 76, albumin is 3.1.  Her INR is 1.03.   MENTAL STATUS EXAM:  She is an alert, thin, middle-aged female, cooperative,  good eye contact, dressed in hospital gown.  Speech is clear, mood is  euthymic, the patient appears flat.  Thought processes are coherent, no  evidence of psychosis, does not appear to be responding to internal stimuli.  Cognitive function intact.  Memory is fair, judgment and insight  are fair.   ADMISSION DIAGNOSES:   AXIS I:  Alcohol dependence.   AXIS II:  Deferred.   AXIS III:  Hypertension, past history of kidney stones.   AXIS IV:  Deferred.   AXIS V:  Current is 40, past year 62-70.   PLAN:  Voluntary admission for alcohol dependence.  Contract for safety,  check every 15 minutes.  Will initiate low-dose Librium to detox safely,  encourage fluids.  Stabilize and assess for symptoms of depression or  anxiety as the patient detoxes.  The patient is to remain alcohol free, to  attend AA, and consider the CDIOP program.   TENTATIVE LENGTH OF CARE:  3-5 days.      Landry Corporal, N.P.                       Jeanice Lim, M.D.    JO/MEDQ  D:  01/16/2003  T:  01/16/2003  Job:   [id number]

## 2010-12-30 NOTE — Op Note (Signed)
TNAMEVIOLETTE, MORNEAULT                          ACCOUNT NO.:  [id number]   MEDICAL RECORD NO.:  [id number]                   PATIENT TYPE:  AMB   LOCATION:  DAY                                  FACILITY:  [facility]   PHYSICIAN:  Valetta Fuller, M.D.               DATE OF BIRTH:  Nov 26, 1940   DATE OF PROCEDURE:  DATE OF DISCHARGE:  03/10/2002                                 OPERATIVE REPORT   PREOPERATIVE DIAGNOSES:  1. Gross hematuria.  2. Bilateral renal calculi.  3. Right proximal ureteral stone.   POSTOPERATIVE DIAGNOSES:  1. Gross hematuria.  2. Bilateral renal calculi.  3. Right distal ureteral calculus.   PROCEDURES:  1. Cystoscopy.  2. Retrograde pyelography.  3. Right-sided ureteroscopy with homium laser lithotripsy and basketing of     fragments as well as double-J stent placement.   SURGEON:  Valetta Fuller, M.D.   ANESTHESIA:  General.   INDICATIONS:  The patient is a 70 year old female.  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  Her urine  has continued to be very acidic despite Uro CK and I have some questions  about whether she has been completely compliant with her medications.  We  have continued to escalate her dose of Uro CK.  She has also passed some  stones spontaneously.  Several weeks ago, she had a CT which showed  bilateral nephrolithiasis but also a 5 mm plus stone in the right proximal  ureter but it was not causing any hydronephrosis or obstruction at that  time.  She has intermittently had pain and gross hematuria and has continued  to do poorly.  For that reason, we elected now to find out if that stone is  still in the ureter and take care of it definitively.   DESCRIPTION OF PROCEDURE:  The patient was brought to the operating room  where she had successful induction of general anesthesia.  The  anesthesiologist had to convert her LMA airway to an endotracheal tube  because of poor airway.  On  cystoscopy, there was no abnormalities.  Light  retrograde pyelogram confirmed a large filling defect in the distal ureter.  A guide was able to be passed up to the renal pelvis.  Ureteroscopy was  performed without dilation.  A 5 by 10 mm stone was encountered in the right  distal ureter.  Homium laser lithotripsy was used to fragment the stone into  numerous pieces, the largest of which were extracted.   This was all relatively atraumatic, but given the multiple manipulations, we  felt we would go ahead and leave a double-J stent in for 72 hours.  A 24 cm  7 French double-J stent was placed over the guide and left to a dangled  string which was secured to the patient's thigh.  Everything went well and  she had no obvious  complications.                                               Valetta Fuller, M.D.    DSG/MEDQ  D:  03/10/2002  T:  03/13/2002  Job:  [id number]   cc:   Barbette Or, M.D.

## 2010-12-30 NOTE — Consult Note (Signed)
[name], [name] NO.:  [id number]   MEDICAL RECORD NO.:  [id number]          PATIENT TYPE:  INP   LOCATION:  5032                         FACILITY:  [facility]   PHYSICIAN:  Candyce Churn, M.D.DATE OF BIRTH:  07-25-1941   DATE OF CONSULTATION:  12/27/2005  DATE OF DISCHARGE:                                   CONSULTATION   FINDINGS:  1.  Left tibial plateau fracture with presumed infection with placement of      antibiotic impregnated granules.  2.  History of B12 deficiency diagnosed in February, 2007 - given oral      replacement initially and lost to followup, with further workup in      progress.  3.  Chronic fatigue which could be related to diagnosis #2.  4.  History of hypertension, controlled.  5.  History of recurrent gross hematuria - extensive workup in 1998 by Dr.      Barron Alvine.  The patient had left renal calculi.  No neoplasm noted.  6.  History of alcohol abuse with increased LFTs in the past.  7.  Tobacco use.  8.  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  9.  Hepatic hemangiomas.  10. Fibrocystic breast disease.  11. Hiatal hernia.  12. Diarrhea - likely antibiotic associated or early Clostridium difficile.  13. Mild hypokalemia - being replaced.  14. Mild anxiety.   RECOMMENDATION:  1.  Check B12, methylmalonic acid levels as well as TSH prior to discharge      in a.m.  2.  Home health to send PT, INR results to my fax at [phone], and I can      follow her INR at [facility].  The question is how      long she needs to stay on Coumadin, and will need this information from      Orthopedics.  3.  Followup on C. diff tox - Add Flora-Q 1 p.o. daily x1-2 weeks.  4.  Continue lisinopril HCT as outpatient for hypertension.  5.  Check urinalysis - Red urine could be rifampin vs. hematuria.  Can      followup as outpatient.  6.  Patient should call [phone] for Clydie Braun for appointment in 1-2 weeks.   (Chart was reviewed, the patient examined and history taken).  Orders were  written.  Agree with care provided by Ortho and ID - well appreciated.  I  have known Ms. Bick for greater than 10 years and I will follow up on labs  when discharged and followup her PT and INR.      Candyce Churn, M.D.  Electronically Signed     RNG/MEDQ  D:  12/27/2005  T:  12/28/2005  Job:  [id number]   cc:   Doralee Albino. Carola Frost, M.D.  Fax: [phone]   Harvie Junior, M.D.  Fax: [phone]   Valetta Fuller, M.D.  Fax: [phone]
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
## 2010-12-30 NOTE — Op Note (Signed)
[name], [name]                 ACCOUNT NO.:  [id number]   MEDICAL RECORD NO.:  [id number]          PATIENT TYPE:  AMB   LOCATION:  SDS                          FACILITY:  [facility]   PHYSICIAN:  Doralee Albino. Carola Frost, M.D. DATE OF BIRTH:  05-Apr-1941   DATE OF PROCEDURE:  03/16/2006  DATE OF DISCHARGE:  03/16/2006                                 OPERATIVE REPORT   SURGEON:  Doralee Albino. Carola Frost, M.D.   ASSISTANT:  Hardin Negus, [facility].   PREOPERATIVE DIAGNOSIS:  Retained antibiotic beads, left medial tibial  plateau.   POSTOPERATIVE DIAGNOSIS:  Retained antibiotic beads, left medial tibial  plateau.   PROCEDURE:  Removal of nonbiodegradable drug-delivery implant, with  application of Norian calcium phosphate cement.   ANESTHESIA:  General.   COMPLICATIONS:  None.   TOTAL TOURNIQUET TIME:  15 minutes.   SPECIMENS:  None.   DISPOSITION:  To PACU.   CONDITION:  Stable.   BRIEF SUMMARY OF INDICATIONS FOR PROCEDURE:  [name] is a 71 year old  female, who underwent ORIF of a bicondylar tibial plateau fracture, which  was complicated in the postoperative period by persistent drainage from the  medial wound.  She eventually underwent partial curettage and debridement.  Cultures were negative.  Antibiotic beads were placed at that time, and she  has gone on to uneventful wound healing after completion of her antibiotic  course.  We discussed preoperatively the risks and benefits of removal of  the beads and reapplication of allograft with Norian phosphate cement.  After full discussion, she wished to proceed.   BRIEF DESCRIPTION OF PROCEDURE:  [name] was taken to the operating room,  where general anesthesia was induced.  She had been administered  preoperative antibiotics.  Her left lower extremity was prepped and draped  in the usual sterile fashion.  A standard medial approach was made after  excising her hypertrophic scar, measuring 4 cm.  We continued dissection  deep, where  the antibiotic beads were identified, and all 5 were removed  without difficulty.  There was no evidence of purulence, deep infection or  other concern.  The bone appeared healthy.  The wound was copiously  irrigated and a sterile tourniquet applied, the leg elevated, and then the  tourniquet inflated to 282 mmHg.  The Norian cement was then applied, using  about 2 cc.  Soft tissue was reapproximated over this cavity to prevent  extravasation, and then a standard layered closure performed with 0 Vicryl,  2-0 Vicryl and a running Prolene.  A sterile gentle compressive dressing was  applied, the patient awakened from anesthesia and transported to the PACU in  stable condition.   PROGNOSIS:  [name] should do well following this injury.  She will be  allowed to continue weightbearing as tolerated.  She will have unrestricted  range of motion of the knee.  She should not require any formal DVT  prophylaxis, given the anticipated mobility.  She does have a risk of soft  tissue reaction to the calcium phosphate cement, but given the incorporation  of what had remained, this is  extremely unlikely.  She will return to clinic  in 10 days for further followup.      Doralee Albino. Carola Frost, M.D.  Electronically Signed     MHH/MEDQ  D:  03/16/2006  T:  03/16/2006  Job:  [id number]

## 2010-12-30 NOTE — Discharge Summary (Signed)
[name], OLHEISER NO.:  [id number]   MEDICAL RECORD NO.:  [id number]          PATIENT TYPE:  INP   LOCATION:  5032                         FACILITY:  [facility]   PHYSICIAN:  Aura Fey. Dennison Bulla, P.A.    DATE OF BIRTH:  August 13, 1941   DATE OF ADMISSION:  12/21/2005  DATE OF DISCHARGE:  12/29/2005                                 DISCHARGE SUMMARY   ADMITTING DIAGNOSIS:  Deep infection.   POSTOPERATIVE DIAGNOSES:  Tibial plateau open reduction, internal fixation  on the left, hematoma lateral wound status post tibial plateau fracture,  open reduction, internal fixation on the left, history of alcohol abuse,  history of hypertension.   DISCHARGE DIAGNOSES:  Tibial plateau open reduction, internal fixation on  the left, hematoma lateral wound status post tibial plateau fracture, open  reduction, internal fixation on the left, history of alcohol abuse, history  of hypertension.  Hypokalemia.  C-difficile diarrhea, antibiotic induced.  Anticoagulation therapy.   OPERATION/PROCEDURE:  Irrigation and debridement medial left knee proximal  tibial wound, irrigation and debridement, evacuation of hematoma left knee  lateral, proximal tibial wound, aspiration left knee, implantation of  antibiotic beads, medial tibial plateau.   CONSULTATION:  Candyce Churn, M.D., primary care physician;  infectious disease for assistance with management of deep infection left  knee, tibial plateau fracture.   HISTORY OF PRESENT ILLNESS:  The patient is a 70 year old black female  status post ORIF tibial plateau who developed increased pain, swelling,  drainage of the medial wound, who came to our office for followup.  Had  significant discomfort, redness, swelling and pain, arrangements were made  to admit the patient to the hospital for postoperative wound infection,  status post ORIF tibial plateau with bone splint, admit for Unasyn, N.P.O.  after midnight, and arrangements for  irrigation and debridement at 7:30 a.m.  on 12/22/2005.   HOSPITAL COURSE:  The patient underwent admission to the hospital, started  on Unasyn IV.  Underwent irrigation and debridement and implantation of  antibiotic beads, medial tibial plateau to the left knee.  Tolerated this  procedure well under general anesthesia.  Postoperatively she was doing very  well.  Hemovac drain in place laterally, Penrose drain medially.  She was to  be non-weight-bearing  left lower extremity.  Continue with Unasyn  empirically and adjust according to cultures and assistance from infectious  disease physicians.  Restarted Coumadin DVT prophylaxis per pharmacy.   First day postoperatively, pain was well controlled.  No calf pain.  Tolerating a diet.  some difficulty voiding.  Temperature maximum was 101.5.  Her vital signs were stable.  Cultures were negative.  Oxygen saturation 92  to 100% on two liters of oxygen.  Dressing was changed.  Penrose was  discontinued.  Hemovac drain was left in place laterally.  She had an acute  blood loss anemia secondary to her operation.  Continuous packed RBCs were  transfused.  Second day postoperatively,  the patient was improving.  Hemoglobin 10.5.  Hematocrit 30.4 post transfusion.  Continued on Coumadin.  Third day postoperatively the patient was  continuing to improve.  Discharge  plan was started.  Vital signs were stable.  Cultures remained negative.  Dressing was changed.  All drains were discontinued.   She continued to make improvement with therapy.  It was felt she was going  to be ready for discharge home on 05/15, however, she developed some acute  severe watery diarrhea, sent off for culture and C-difficile culture was  found to be positive.  Infectious disease was consulted for assistance.  Infectious disease was very helpful in antibiotic treatment, placed on  Avelox and Rifampin as well as Flagyl for treatment of her C-difficile  diarrhea.  Dr. Kevan Ny  came to see the patient as well.  Infectious disease  recommended Rifampin and Avelox for four weeks, possibly up to six weeks.   The patient was feeling better and ready for discharge home on 05/18.  Diarrhea had subsided.  Vital signs were stable.  The wounds looked much  better, decreased redness, decreased tenderness, decreased pain, negative  Homans, calf was soft.  Neurovascularly intact left lower extremity.   CONDITION:  Stable and improved.   DISCHARGE INSTRUCTIONS:  Continue on Rifampin, Avelox and Flagyl for left  wound infection, C-difficile diarrhea colitis.  Discharge home.  Given  prescription for Percocet, Coumadin and antibiotics.  Continue with Coumadin  and follow as an outpatient.  Followup with Dr. Kevan Ny as an outpatient.  Followup with infectious disease as an outpatient.  Followup with Dr. Carola Frost  in three to five days.  Followup with Dr. Kevan Ny regarding  B12, TSH and  methylmalonic acid tests were done.  Home health was arranged for INR and PT  to be drawn as well as home health therapy as needed.   LABORATORY DATA:  Admission WBC 9.2, hematocrit 32.2, hemoglobin 11.1,  platelet count 641.  At the time of discharge hemoglobin 11.1, hematocrit  32.9, WBC 7.8, platelet count 613.  All other indices can be obtained per  house record.  At the time of discharge her INR was 2.2.  Therapeutic on  Coumadin with a PT of 24.5.  At the time of discharge sodium was 141,  potassium 3.6, all other chemistries can be obtained with permanent hospital  record.  TSH was 0.856 within normal limits.  C-reactive protein was 8.9.  Cultures remained negative throughout her hospitalization.   DISCHARGE INSTRUCTIONS:  Discharged home, home health, physical therapy,  occupational therapy, durable medical equipment as needed, followup with Dr.  Carola Frost in three to five days, non-weight-bearing  left lower extremity, daily dressing changes, continue on Coumadin, Avelox, Rifampin, and  Flagyl.  Followup with Dr. Kevan Ny as an outpatient.  Followup with infectious disease  as an outpatient.  Contact our office prior to followup if there are any  questions or concerns.  Regular diet.      [name].     SCI/MEDQ  D:  02/15/2006  T:  02/15/2006  Job:  [id number]

## 2010-12-30 NOTE — Op Note (Signed)
NAMEGIFT, RUECKERT NO.:  [id number]   MEDICAL RECORD NO.:  [id number]          PATIENT TYPE:  INP   LOCATION:  5032                         FACILITY:  [facility]   PHYSICIAN:  Doralee Albino. Carola Frost, M.D. DATE OF BIRTH:  05-Nov-1940   DATE OF PROCEDURE:  12/22/2005  DATE OF DISCHARGE:                                 OPERATIVE REPORT   PREOPERATIVE DIAGNOSIS:  Left tibial plateau suspected wound infection.   POSTOPERATIVE DIAGNOSES:  1.  Soft tissue reaction to calcium phosphate cement medial tibial plateau      wound.  2.  Hematoma left tibial plateau lateral wound.   PROCEDURES:  1.  Aspiration of left knee joint.  2.  Irrigation and debridement with removal of Norian cement medial tibial      plateau wound.  3.  Evacuation of hematoma lateral tibial plateau wound.  4.  Implantation of non-biodegradable drug delivery implant (antibiotic      beads).   SURGEON:  Doralee Albino. Carola Frost, M.D.   ASSISTANT:  Aura Fey. Bobbe Medico.   ANESTHESIA:  General.   COMPLICATIONS:  None.   DRAINS:  Two medial Penrose and a lateral medium Hemovac.   SPECIMENS:  Six anaerobic/aerobic culture and Norian cement block from the  medial wound, anaerobic/aerobic culture from the lateral hematoma wound and  _________ synovial fluid which was serosanguinous and clear which was sent  for cell count, Gram's stain and culture.   DISPOSITION:  PACU.   CONDITION:  Stable.   INDICATIONS FOR PROCEDURE:  Ms. Strom has developed  draining from her medial  wound which has been persistent and did not improve with antibiotics. After  discussion of the risks and benefits of surgery, she elected to undergo the  recommended irrigation and debridement.   DESCRIPTION OF PROCEDURE:  Ms. Westall was taken to the operating room.  She  remained on Unasyn perioperatively given that cultures had been obtained in  the clinic. The Gram's stain on that was negative for poly's. It did show  chronic mononuclear  cells and no bacteria were present either. Growth  results are pending. A aspiration was performed of the left knee which  produced 15 mL of clear serosanguinous type of synovial fluid.  This was  sent for stat Gram's stain, cell count and culture.  After standard prep and  drape and placement of tourniquet which was not inflated during the case  given that the patient had been placed on DVT prophylaxis, a medial incision  was opened and the draining sinus tract curetted and excised sharply with a  knife. We then exposed the deep layer where we encountered some loose and  somewhat prominent Norian cement. This was removed with a Kocher and  curette. Some of the hardware was visible.  The remainder of the Norian  appeared well fixed and consequently was not removed since we did not  encounter any purulence whatsoever on the way in. We then proceeded with 3  liters of normal saline irrigation. We then made a 4 cm incision over the  lateral wound in the area where  it had been somewhat protuberant and there  was some mild associated erythema. We did not encounter any purulence there  as well.  We did send off as we had done on the medial side new anaerobic  and aerobic cultures. We were able to evacuate a large hematoma which  exceeded 20 mL into this cavity.  We then began additional irrigation with  another 3 liters of normal saline. The last 3 liter bag was used for both  sides. We then placed a medium Hemovac into this cavity and performed a very  loose closure with sparse inverted 2-0 PDS and 3-0 nylon. On the medial side  because of the exposed bone and the possibility of infection as well as the  suspicion that grafting would be necessary in the future, we went ahead and  placed five small antibiotic beads which contained tobramycin and  Vancomycin. The initial mixture had been made with 500 mg of Vancomycin and  1.2 grams of tobramycin.  This was placed on a PDS #1 suture and again  there  were 5 beads. The deep layer was loosely reapproximated with two simple 2-0  PDS sutures. Penrose was placed into this deep cavity as well. One 2-0 PDS  is placed in the subcu area and then very sparse 3-0 nylon sutures. A  sterile gently compressive dressing was applied.  The patient was awakened  from anesthesia and transported to PACU in stable condition.   PROGNOSIS:  At this time, Ms. Inghram appears to have stable fixation.  There  is no loosening and I suspect that her fracture will go on to unite. Future  hardware removal will be based upon whether or not cultures are positive.  She will most likely require future bone grafting for the medial defect. If  she does develop enough growth, then we may be able to simply remove the  antibiotic beads without placement of new material.  The knee aspirate had  only a 825 white cells which is certainly not indicative of any inflammatory  process including infection at this time.  Currently, however we are still  awaiting results of Gram's stain and will base our treatment algorithm on  the results of culture.  Drains are anticipated to be removed in 2 days.  She will remain nonweightbearing until 6-8 weeks status post ORIF at which  time we will begin graduated weightbearing.      Doralee Albino. Carola Frost, M.D.  Electronically Signed     MHH/MEDQ  D:  12/22/2005  T:  12/23/2005  Job:  [id number]

## 2011-05-23 LAB — D-DIMER, QUANTITATIVE: D-Dimer, Quant: 0.35

## 2011-05-23 LAB — POCT CARDIAC MARKERS
CKMB, poc: 1.7
Myoglobin, poc: 180
Myoglobin, poc: 207
Operator id: 294341

## 2011-05-23 LAB — I-STAT 8, (EC8 V) (CONVERTED LAB)
BUN: 7
Bicarbonate: 24.5 — ABNORMAL HIGH
Glucose, Bld: 150 — ABNORMAL HIGH
Operator id: 294341
pCO2, Ven: 34.6 — ABNORMAL LOW
pH, Ven: 7.458 — ABNORMAL HIGH

## 2011-05-23 LAB — PROTIME-INR: INR: 1

## 2012-04-04 ENCOUNTER — Emergency Department (INDEPENDENT_AMBULATORY_CARE_PROVIDER_SITE_OTHER)
Admission: EM | Admit: 2012-04-04 | Discharge: 2012-04-04 | Disposition: A | Discharge status: Home / Self Care | Payer: Medicare Other | Source: Home / Self Care | Attending: Family Medicine | Admitting: Family Medicine

## 2012-04-04 ENCOUNTER — Encounter (HOSPITAL_COMMUNITY): Payer: Self-pay | Admitting: *Deleted

## 2012-04-04 DIAGNOSIS — L239 Allergic contact dermatitis, unspecified cause: Secondary | ICD-10-CM

## 2012-04-04 DIAGNOSIS — L259 Unspecified contact dermatitis, unspecified cause: Secondary | ICD-10-CM | POA: Diagnosis not present

## 2012-04-04 HISTORY — DX: Essential (primary) hypertension: I10

## 2012-04-04 MED ORDER — TRIAMCINOLONE ACETONIDE 0.5 % EX OINT: TOPICAL_OINTMENT | Freq: Two times a day (BID) | CUTANEOUS | Status: DC

## 2012-04-04 MED ORDER — BACITRACIN-PRAMOXINE HCL 500-10 UNIT-MG/GM EX OINT: 1.0000 "application " | TOPICAL_OINTMENT | Freq: Three times a day (TID) | CUTANEOUS | Status: DC | PRN
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
## 2012-04-04 NOTE — ED Provider Notes (Signed)
History     CSN: [id number]  Arrival date & time 04/04/12  1120   First MD Initiated Contact with Patient 04/04/12 1131      Chief Complaint  Patient presents with  . Insect Bite    (Consider location/radiation/quality/duration/timing/severity/associated sxs/prior treatment) HPI Comments: 71 year old female with history of hypertension. Here complaining of a pruriginous rash in her left wrist and hand present for one week. Patient reports that she was bitten "by a spider, she was able to see".. Has been itching and patient has been scratching frequently. Reports itchiness and burning sensation denies pain. No drainage. No fever or chills. She has been using hydrocortisone cream over-the-counter with minimal relief.   Past Medical History  Diagnosis Date  . Hypertension     Past Surgical History  Procedure Date  . Leg surgery     No family history on file.  History  Substance Use Topics  . Smoking status: Never Smoker   . Smokeless tobacco: Not on file  . Alcohol Use: Yes     socially    OB History    Grav Para Term Preterm Abortions TAB SAB Ect Mult Living                  Review of Systems  Constitutional: Negative for fever and chills.       10 systems reviewed and  pertinent negative and positive symptoms are as per HPI.     Skin:       As per HPI  All other systems reviewed and are negative.    Allergies  Review of patient's allergies indicates not on file.  Home Medications   Current Outpatient Rx  Name Route Sig Dispense Refill  . BACITRACIN-PRAMOXINE HCL 500-10 UNIT-MG/GM EX OINT Apply externally Apply 1 application topically 3 (three) times daily as needed. 1 Tube 0  . TRIAMCINOLONE ACETONIDE 0.5 % EX OINT Topical Apply topically 2 (two) times daily. 30 g 0    BP 162/85  Pulse 80  Temp 98.4 F (36.9 C) (Oral)  Resp 16  SpO2 98%  Physical Exam  Nursing note and vitals reviewed. Constitutional: She is oriented to person, place, and time.  She appears well-developed and well-nourished. No distress.  HENT:  Head: Normocephalic and atraumatic.  Eyes: Conjunctivae are normal.  Cardiovascular: Normal heart sounds.   Pulmonary/Chest: Breath sounds normal.  Musculoskeletal:       Left hand: limited extension of 4th and 5th fingers due to past damage to extensor tendons. Right hand: Also limited extension of 5th fingers due to past damage to extensor tendon.   Lymphadenopathy:    She has no cervical adenopathy.  Neurological: She is alert and oriented to person, place, and time.  Skin:       Left hand: excoriated erythema and skin thickening in medial dorsal aspect of hand and wrist. No pustules, no fluctuations. No striking erythema.  There are vertical scars in volar surface of wrist from prior surgeries. There are also contracted vertical scars in palmar side of 5th metacarpophalangeal joints in both hands.         ED Course  Procedures (including critical care time)  Labs Reviewed - No data to display No results found.   1. Allergic dermatitis       MDM  Skin thickening and excoriations from scratching likely related to allergic dermatitis. No exudates or fluctuations does not impress over infected. Prescribed Triamcinolone ointment alternating with bacitracin/pramoxine mixed cream. Hand was wrapped to avoid  the patient scratching. Asked to return if worsening symptoms despite following treatment or if persistent symptoms after 5-7 days of following treatment.        Sharin Grave, MD 04/06/12 1047

## 2012-04-18 ENCOUNTER — Encounter (HOSPITAL_COMMUNITY): Payer: Self-pay | Admitting: *Deleted

## 2012-04-18 ENCOUNTER — Emergency Department (INDEPENDENT_AMBULATORY_CARE_PROVIDER_SITE_OTHER)
Admission: EM | Admit: 2012-04-18 | Discharge: 2012-04-18 | Disposition: A | Discharge status: Home / Self Care | Payer: Medicare Other | Source: Home / Self Care | Attending: Emergency Medicine | Admitting: Emergency Medicine

## 2012-04-18 DIAGNOSIS — L259 Unspecified contact dermatitis, unspecified cause: Secondary | ICD-10-CM

## 2012-04-18 MED ORDER — BETAMETHASONE DIPROPIONATE 0.05 % EX OINT: TOPICAL_OINTMENT | Freq: Two times a day (BID) | CUTANEOUS | Status: DC

## 2012-04-18 MED ORDER — PREDNISONE 10 MG PO TABS: ORAL_TABLET | ORAL | Status: DC

## 2012-04-18 MED ORDER — METHYLPREDNISOLONE ACETATE 80 MG/ML IJ SUSP
80.0000 mg | Freq: Once | INTRAMUSCULAR | Status: AC
  Administered 2012-04-18: 80 mg via INTRAMUSCULAR

## 2012-04-18 MED ORDER — METHYLPREDNISOLONE ACETATE 80 MG/ML IJ SUSP
INTRAMUSCULAR | Status: AC
  Filled 2012-04-18: qty 1

## 2012-04-18 NOTE — ED Notes (Signed)
Family not at bedside.

## 2012-04-18 NOTE — ED Provider Notes (Signed)
Chief Complaint  Patient presents with  . Rash    History of Present Illness:   [name] is a 71 year old female who presents today with a rash on her left wrist. She was seen here about a week ago at which time she gave a history of being bitten by a small brown spider on the top of her left wrist about 2 weeks previously. She was given triamcinolone cream, but the rash isn't any better. The rash is swollen, red, painful, and itchy. It hurts worse with exposed to water or to soap. She has some numbness in the top of her hand. She also has a little itching in her neck as well. She states this seems to be getting worse, although from the pictures taken by Dr. Alfonse Ras at her last visit a week ago, there doesn't appear to be much difference. She denies any fever, chills, nausea, vomiting, difficulty breathing, swelling her lips, tongue, or throat.  Review of Systems:  Other than noted above, the patient denies any of the following symptoms: Systemic:  No fever, chills, sweats, weight loss, or fatigue. ENT:  No nasal congestion, rhinorrhea, sore throat, swelling of lips, tongue or throat. Resp:  No cough, wheezing, or shortness of breath. Skin:  No rash, itching, nodules, or suspicious lesions.  PMFSH:  Past medical history, family history, social history, meds, and allergies were reviewed.  Physical Exam:   Vital signs:  BP 157/75  Pulse 74  Temp 98.7 F (37.1 C) (Oral)  Resp 16  SpO2 99% Gen:  Alert, oriented, in no distress. ENT:  Pharynx clear, no intraoral lesions, moist mucous membranes. Lungs:  Clear to auscultation. Skin:  There is an erythematous, eczematous eyes rash on the dorsum of the left hand extending just proximal to the wrist. Her skin was otherwise clear. She has Dupuytren's contractures of both hands. She also has a surgical scar on her volar aspect of the right wrist.       Assessment:  The encounter diagnosis was Contact dermatitis. 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  Plan:   1.  The following meds were prescribed:   New Prescriptions   BETAMETHASONE DIPROPIONATE (DIPROLENE) 0.05 % OINTMENT    Apply topically 2 (two) times daily.   PREDNISONE (DELTASONE) 10 MG TABLET    Take 4 tabs daily for 4 days, 3 tabs daily for 4 days, 2 tabs daily for 4 days, then 1 tab daily for 4 days.   2.  The patient was instructed in symptomatic care and handouts were given. 3.  The patient was told to return if becoming worse in any way, if no better in 3 or 4 days, and given some red flag symptoms that would indicate earlier return.     Reuben Likes, MD 04/18/12 2200

## 2012-04-18 NOTE — ED Notes (Signed)
Pt reports "spider bite" 3 weeks ago - seen here last week and given a cream. Pt states that it has not gotten any better. Redness and swelling to left hand

## 2012-05-23 DIAGNOSIS — L259 Unspecified contact dermatitis, unspecified cause: Secondary | ICD-10-CM | POA: Diagnosis not present

## 2012-07-16 ENCOUNTER — Emergency Department (HOSPITAL_COMMUNITY): Payer: Medicare Other

## 2012-07-16 ENCOUNTER — Emergency Department (HOSPITAL_COMMUNITY)
Admission: EM | Admit: 2012-07-16 | Discharge: 2012-07-16 | Disposition: A | Discharge status: Home / Self Care | Payer: Medicare Other | Attending: Emergency Medicine | Admitting: Emergency Medicine

## 2012-07-16 ENCOUNTER — Encounter (HOSPITAL_COMMUNITY): Payer: Self-pay | Admitting: *Deleted

## 2012-07-16 DIAGNOSIS — M109 Gout, unspecified: Secondary | ICD-10-CM | POA: Diagnosis not present

## 2012-07-16 DIAGNOSIS — Z8679 Personal history of other diseases of the circulatory system: Secondary | ICD-10-CM | POA: Diagnosis not present

## 2012-07-16 DIAGNOSIS — M949 Disorder of cartilage, unspecified: Secondary | ICD-10-CM | POA: Diagnosis not present

## 2012-07-16 LAB — BASIC METABOLIC PANEL
CO2: 24 mEq/L (ref 19–32)
Calcium: 8.9 mg/dL (ref 8.4–10.5)
Chloride: 96 mEq/L (ref 96–112)
Glucose, Bld: 170 mg/dL — ABNORMAL HIGH (ref 70–99)
Sodium: 133 mEq/L — ABNORMAL LOW (ref 135–145)

## 2012-07-16 LAB — CBC WITH DIFFERENTIAL/PLATELET
Eosinophils Relative: 3 % (ref 0–5)
HCT: 49.1 % — ABNORMAL HIGH (ref 36.0–46.0)
Lymphocytes Relative: 16 % (ref 12–46)
Lymphs Abs: 1.6 10*3/uL (ref 0.7–4.0)
MCV: 98.8 fL (ref 78.0–100.0)
Monocytes Absolute: 0.6 10*3/uL (ref 0.1–1.0)
Neutro Abs: 7.3 10*3/uL (ref 1.7–7.7)
Platelets: 240 10*3/uL (ref 150–400)
RBC: 4.97 MIL/uL (ref 3.87–5.11)
WBC: 9.8 10*3/uL (ref 4.0–10.5)

## 2012-07-16 MED ORDER — HYDROCODONE-ACETAMINOPHEN 5-325 MG PO TABS
1.0000 | ORAL_TABLET | Freq: Once | ORAL | Status: AC
  Administered 2012-07-16: 1 via ORAL
  Filled 2012-07-16: qty 1

## 2012-07-16 MED ORDER — HYDROCODONE-ACETAMINOPHEN 5-500 MG PO TABS: 1.0000 | ORAL_TABLET | Freq: Four times a day (QID) | ORAL | Status: DC | PRN

## 2012-07-16 MED ORDER — INDOMETHACIN 25 MG PO CAPS: 25.0000 mg | ORAL_CAPSULE | Freq: Three times a day (TID) | ORAL | Status: DC | PRN

## 2012-07-16 NOTE — ED Provider Notes (Signed)
History     CSN: [id number]  Arrival date & time 07/16/12  1112   First MD Initiated Contact with Patient 07/16/12 1214      Chief Complaint  Patient presents with  . Foot Pain    (Consider location/radiation/quality/duration/timing/severity/associated sxs/prior treatment) HPI  Patient presents to the ER brought in by her son with right great toe pain for 4 days.  She deneis right foot injury or her toe feeling hot.  She has never had this sort of pain before nor has she ever been diagnosed with gout.  She has not had any systemic symptoms of fevers, weakness, diarrhea, chills, nausea,  vomiting. nad vss.   Past Medical History  Diagnosis Date  . Hypertension     Past Surgical History  Procedure Date  . Leg surgery     No family history on file.  History  Substance Use Topics  . Smoking status: Never Smoker   . Smokeless tobacco: Not on file  . Alcohol Use: 1.5 oz/week    3 drink(s) per week     Comment: daily    OB History    Grav Para Term Preterm Abortions TAB SAB Ect Mult Living                  Review of Systems  Review of Systems  Gen: no weight loss, fevers, chills, night sweats  Neck: no neck pain  Lungs:No wheezing, coughing or hemoptysis CV: no chest pain, palpitations, dependent edema or orthopnea  Abd: no abdominal pain, nausea, vomiting  GU: no dysuria or gross hematuria  MSK: right great toe pain Neuro: no headache, no focal neurologic deficits  Skin: no abnormalities Psyche: negative.   Allergies  Review of patient's allergies indicates no known allergies.  Home Medications  No current outpatient prescriptions on file.  BP 154/88  Pulse 82  Temp 98.5 F (36.9 C) (Oral)  Resp 20  SpO2 98%  Physical Exam  Nursing note and vitals reviewed. Constitutional: She appears well-developed and well-nourished. No distress.  HENT:  Head: Normocephalic and atraumatic.  Eyes: Pupils are equal, round, and reactive to light.  Neck:  Normal range of motion. Neck supple.  Cardiovascular: Normal rate and regular rhythm.   Pulmonary/Chest: Effort normal.  Abdominal: Soft.  Musculoskeletal:       Right foot: She exhibits decreased range of motion (due to pain) and tenderness. She exhibits no bony tenderness, no swelling, normal capillary refill, no crepitus, no deformity and no laceration.       Feet:  Neurological: She is alert.  Skin: Skin is warm and dry.    ED Course  Procedures (including critical care time)  Labs Reviewed  CBC WITH DIFFERENTIAL - Abnormal; Notable for the following:    Hemoglobin 16.1 (*)     HCT 49.1 (*)     All other components within normal limits  BASIC METABOLIC PANEL - Abnormal; Notable for the following:    Sodium 133 (*)     Glucose, Bld 170 (*)     BUN 5 (*)     GFR calc non Af Amer 88 (*)     All other components within normal limits   Dg Foot Complete Right  07/16/2012  *RADIOLOGY REPORT*  Clinical Data: Generalized foot pain with erythema and swelling. No known injury.  History of gout.  RIGHT FOOT COMPLETE - 3+ VIEW  Comparison: None.  Findings: There is periarticular osteopenia.  Mild joint space loss is present at the first  metatarsal phalangeal joint.  There is no evidence of acute fracture, dislocation or erosive change.  No focal soft tissue swelling or foreign body is evident.  IMPRESSION: Periarticular osteopenia.  No acute osseous findings identified.   Original Report Authenticated By: Carey Bullocks, M.D.      No diagnosis found. Dx. Gout   MDM  Discussed pt with Dr. Preston Fleeting who saw patient and agrees with my findings. Will treat for gout with Vicodin and indomethacin. Pt is to follow-up with PCP.  Labs and xray none acute.  Pt has been advised of the symptoms that warrant their return to the ED. Patient has voiced understanding and has agreed to follow-up with the PCP or specialist.          Dorthula Matas, PA 07/16/12 1420

## 2012-07-16 NOTE — ED Provider Notes (Signed)
71 year old female with right toe pain for the last 3 days. On exam, there is erythema and severe tenderness around the right first MTP joint consistent with gout. She will be treated at for gout and she is to followup with her PCP.  Medical screening examination/treatment/procedure(s) were conducted as a shared visit with non-physician practitioner(s) and myself.  I personally evaluated the patient during the encounter   Dione Booze, MD 07/16/12 1416

## 2012-07-16 NOTE — ED Notes (Signed)
Pt reports R foot pain x 4 days, ?gout.  Redness and swelling noted on top of her R foot.  Pain worse with weight bearing.

## 2012-07-16 NOTE — ED Notes (Signed)
Pt's son refused crutches stating that he was hungry and they needed to go. Crutches order cancelled. Pt discharged.

## 2012-12-09 ENCOUNTER — Emergency Department (HOSPITAL_COMMUNITY)
Admission: EM | Admit: 2012-12-09 | Discharge: 2012-12-09 | Disposition: A | Discharge status: Home / Self Care | Payer: Medicare Other | Attending: Emergency Medicine | Admitting: Emergency Medicine

## 2012-12-09 ENCOUNTER — Emergency Department (HOSPITAL_COMMUNITY): Payer: Medicare Other

## 2012-12-09 ENCOUNTER — Encounter (HOSPITAL_COMMUNITY): Payer: Self-pay | Admitting: *Deleted

## 2012-12-09 DIAGNOSIS — Y9389 Activity, other specified: Secondary | ICD-10-CM | POA: Insufficient documentation

## 2012-12-09 DIAGNOSIS — F10929 Alcohol use, unspecified with intoxication, unspecified: Secondary | ICD-10-CM

## 2012-12-09 DIAGNOSIS — R209 Unspecified disturbances of skin sensation: Secondary | ICD-10-CM | POA: Insufficient documentation

## 2012-12-09 DIAGNOSIS — S92209A Fracture of unspecified tarsal bone(s) of unspecified foot, initial encounter for closed fracture: Secondary | ICD-10-CM | POA: Diagnosis not present

## 2012-12-09 DIAGNOSIS — S92309A Fracture of unspecified metatarsal bone(s), unspecified foot, initial encounter for closed fracture: Secondary | ICD-10-CM | POA: Diagnosis not present

## 2012-12-09 DIAGNOSIS — I6789 Other cerebrovascular disease: Secondary | ICD-10-CM | POA: Diagnosis not present

## 2012-12-09 DIAGNOSIS — Z9889 Other specified postprocedural states: Secondary | ICD-10-CM | POA: Diagnosis not present

## 2012-12-09 DIAGNOSIS — I10 Essential (primary) hypertension: Secondary | ICD-10-CM | POA: Diagnosis not present

## 2012-12-09 DIAGNOSIS — F17200 Nicotine dependence, unspecified, uncomplicated: Secondary | ICD-10-CM | POA: Diagnosis not present

## 2012-12-09 DIAGNOSIS — S92301A Fracture of unspecified metatarsal bone(s), right foot, initial encounter for closed fracture: Secondary | ICD-10-CM

## 2012-12-09 DIAGNOSIS — R202 Paresthesia of skin: Secondary | ICD-10-CM

## 2012-12-09 DIAGNOSIS — W010XXA Fall on same level from slipping, tripping and stumbling without subsequent striking against object, initial encounter: Secondary | ICD-10-CM | POA: Insufficient documentation

## 2012-12-09 DIAGNOSIS — S4980XA Other specified injuries of shoulder and upper arm, unspecified arm, initial encounter: Secondary | ICD-10-CM | POA: Diagnosis not present

## 2012-12-09 DIAGNOSIS — Y9289 Other specified places as the place of occurrence of the external cause: Secondary | ICD-10-CM | POA: Insufficient documentation

## 2012-12-09 MED ORDER — OXYCODONE-ACETAMINOPHEN 5-325 MG PO TABS: 1.0000 | ORAL_TABLET | ORAL | Status: DC | PRN

## 2012-12-09 NOTE — ED Notes (Signed)
WUJ:[id number]<JY> Expected date:12/09/12<BR> Expected time: 2:12 AM<BR> Means of arrival:Ambulance<BR> Comments:<BR> Fall

## 2012-12-09 NOTE — ED Provider Notes (Signed)
History     CSN: [id number]  Arrival date & time 12/09/12  0222   First MD Initiated Contact with Patient 12/09/12 0425      No chief complaint on file.   (Consider location/radiation/quality/duration/timing/severity/associated sxs/prior treatment) HPI 72 year old female presents to emergency room via EMS with complaint of fall, with right arm numbness, and right foot pain and swelling.  Patient appears to be intoxicated, and does admit to drinking one beer.  She denies striking her head, no LOC, no neck pain.  No chest pain or shortness of breath, abdominal pain.  Patient has been able to walk on her injured foot.  Patient reports that her right arm is non-with pins and needle sensation to mid arm down to her fingers.  She denies sleeping on an aqua position.  No improvement with movement.  She has normal range of motion and strength in the right arm.  She is right-hand dominant.  Past Medical History  Diagnosis Date  . Hypertension     Past Surgical History  Procedure Laterality Date  . Leg surgery    . Wrist surgery      left wrist    No family history on file.  History  Substance Use Topics  . Smoking status: Never Smoker   . Smokeless tobacco: Not on file  . Alcohol Use: 1.5 oz/week    3 drink(s) per week     Comment: daily    OB History   Grav Para Term Preterm Abortions TAB SAB Ect Mult Living                  Review of Systems  Unable to perform ROS: Psychiatric disorder   intoxication  Allergies  Review of patient's allergies indicates no known allergies.  Home Medications   Current Outpatient Rx  Name  Route  Sig  Dispense  Refill  . oxyCODONE-acetaminophen (PERCOCET/ROXICET) 5-325 MG per tablet   Oral   Take 1 tablet by mouth every 4 (four) hours as needed for pain.   10 tablet   0     BP 137/76  Pulse 92  Temp(Src) 98.6 F (37 C) (Oral)  Resp 18  SpO2 95%  Physical Exam  Nursing note and vitals reviewed. Constitutional: She appears  well-developed and well-nourished. No distress.  HENT:  Head: Normocephalic and atraumatic.  Nose: Nose normal.  Mouth/Throat: Oropharynx is clear and moist. No oropharyngeal exudate.  Eyes: Conjunctivae and EOM are normal. Pupils are equal, round, and reactive to light.  Neck: Normal range of motion. Neck supple. No JVD present. No tracheal deviation present. No thyromegaly present.  Cardiovascular: Normal rate, regular rhythm, normal heart sounds and intact distal pulses.  Exam reveals no gallop and no friction rub.   No murmur heard. Pulmonary/Chest: Effort normal and breath sounds normal. No stridor. No respiratory distress. She has no wheezes. She has no rales. She exhibits no tenderness.  Abdominal: Soft. Bowel sounds are normal. She exhibits no distension and no mass. There is no tenderness. There is no rebound and no guarding.  Musculoskeletal: Normal range of motion. She exhibits tenderness (patient with soft tissue swelling diffusely over the right foot, tenderness to lateral aspect of the right foot). She exhibits no edema.  Lymphadenopathy:    She has no cervical adenopathy.  Neurological: She is alert. She displays normal reflexes. She exhibits normal muscle tone. Coordination (wide-based gait) abnormal.  Slurred speech, nystagmus.  Patient reports she has no sensation to either light or sharp touch  from mid humerus down.  The distribution of this is not consistent with testing over different dermatomes and levels.  Skin: Skin is warm and dry. No rash noted. No erythema. No pallor.    ED Course  Procedures (including critical care time)  Labs Reviewed - No data to display Dg Foot Complete Right  12/09/2012  *RADIOLOGY REPORT*  Clinical Data: Fall last night with pain and soft tissue swelling in the lateral right foot.  RIGHT FOOT COMPLETE - 3+ VIEW  Comparison: 07/16/2012  Findings: Diffuse bone demineralization.  Transverse fracture of the distal right fifth metatarsal shaft  with mild medial angulation of the distal fracture fragment.  Soft tissue swelling is present. Degenerative changes in the first metatarsophalangeal joint and in the interphalangeal joints.  Vascular calcifications.  Achilles calcaneal spur.  No destructive or expansile bone lesions.  No radiopaque soft tissue foreign bodies.  IMPRESSION: Acute transverse fracture of the distal right fifth metatarsal shaft.  Diffuse bone demineralization.   Original Report Authenticated By: Burman Nieves, M.D.      1. Alcohol intoxication   2. Paresthesia of right arm   3. Fracture of 5th metatarsal, right, closed, initial encounter       MDM  72 year old female with right arm, paresthesias, possibly positional given that patient has been found multiple times by myself and the nursing staff to be sleeping with her arm tucked up underneath her head in upright awkward position.  Also, may be a Saturday night palsy.  Other differential includes peripheral neuropathy.  Will have her followup with her primary care doctor for further workup of this paresthesia.  Right fifth metatarsal fracture is nondisplaced.  She will be able to go home in a postop shoe.  I discussed the patient's care with her son who is planning to take her home as he does not feel she can live on her own anymore        Olivia Mackie, MD 12/09/12 [phone]

## 2012-12-09 NOTE — ED Notes (Signed)
Per EMS report: pt from home: pt slipped coming out of the bathroom.  No LOC and EMS did not put pt on a long spine board.  Pt reports numbness in her right arm and right foot is swollen.  Pt able to move right arm and has sensation.  Pt ambulated out of home to stretcher.  EMS reports pt had 1 large beer.  EMS vitals: BP: 132/78, HR: 88, CBG: 170

## 2012-12-09 NOTE — ED Notes (Signed)
Pt back from x-ray.

## 2012-12-12 DIAGNOSIS — S72002A Fracture of unspecified part of neck of left femur, initial encounter for closed fracture: Secondary | ICD-10-CM

## 2012-12-12 HISTORY — DX: Fracture of unspecified part of neck of left femur, initial encounter for closed fracture: S72.002A

## 2012-12-15 ENCOUNTER — Encounter (HOSPITAL_COMMUNITY): Payer: Self-pay

## 2012-12-15 ENCOUNTER — Emergency Department (HOSPITAL_COMMUNITY): Payer: Medicare Other

## 2012-12-15 ENCOUNTER — Inpatient Hospital Stay (HOSPITAL_COMMUNITY)
Admission: EM | Admit: 2012-12-15 | Discharge: 2012-12-19 | DRG: 470 | Disposition: A | Discharge status: Skilled Nursing Facility | Payer: Medicare Other | Attending: Internal Medicine | Admitting: Internal Medicine

## 2012-12-15 DIAGNOSIS — E785 Hyperlipidemia, unspecified: Secondary | ICD-10-CM | POA: Diagnosis present

## 2012-12-15 DIAGNOSIS — D62 Acute posthemorrhagic anemia: Secondary | ICD-10-CM | POA: Diagnosis not present

## 2012-12-15 DIAGNOSIS — K222 Esophageal obstruction: Secondary | ICD-10-CM | POA: Diagnosis present

## 2012-12-15 DIAGNOSIS — W010XXA Fall on same level from slipping, tripping and stumbling without subsequent striking against object, initial encounter: Secondary | ICD-10-CM | POA: Diagnosis present

## 2012-12-15 DIAGNOSIS — F1010 Alcohol abuse, uncomplicated: Secondary | ICD-10-CM | POA: Diagnosis present

## 2012-12-15 DIAGNOSIS — E559 Vitamin D deficiency, unspecified: Secondary | ICD-10-CM | POA: Diagnosis present

## 2012-12-15 DIAGNOSIS — S72002A Fracture of unspecified part of neck of left femur, initial encounter for closed fracture: Secondary | ICD-10-CM

## 2012-12-15 DIAGNOSIS — S92354A Nondisplaced fracture of fifth metatarsal bone, right foot, initial encounter for closed fracture: Secondary | ICD-10-CM

## 2012-12-15 DIAGNOSIS — M810 Age-related osteoporosis without current pathological fracture: Secondary | ICD-10-CM | POA: Diagnosis present

## 2012-12-15 DIAGNOSIS — S6990XA Unspecified injury of unspecified wrist, hand and finger(s), initial encounter: Secondary | ICD-10-CM | POA: Diagnosis not present

## 2012-12-15 DIAGNOSIS — S72009A Fracture of unspecified part of neck of unspecified femur, initial encounter for closed fracture: Principal | ICD-10-CM | POA: Diagnosis present

## 2012-12-15 DIAGNOSIS — M109 Gout, unspecified: Secondary | ICD-10-CM | POA: Diagnosis present

## 2012-12-15 DIAGNOSIS — K219 Gastro-esophageal reflux disease without esophagitis: Secondary | ICD-10-CM | POA: Diagnosis present

## 2012-12-15 DIAGNOSIS — S298XXA Other specified injuries of thorax, initial encounter: Secondary | ICD-10-CM | POA: Diagnosis not present

## 2012-12-15 DIAGNOSIS — M25529 Pain in unspecified elbow: Secondary | ICD-10-CM | POA: Diagnosis not present

## 2012-12-15 DIAGNOSIS — I4891 Unspecified atrial fibrillation: Secondary | ICD-10-CM | POA: Diagnosis present

## 2012-12-15 DIAGNOSIS — I10 Essential (primary) hypertension: Secondary | ICD-10-CM | POA: Diagnosis present

## 2012-12-15 DIAGNOSIS — F1020 Alcohol dependence, uncomplicated: Secondary | ICD-10-CM | POA: Diagnosis present

## 2012-12-15 DIAGNOSIS — Z8673 Personal history of transient ischemic attack (TIA), and cerebral infarction without residual deficits: Secondary | ICD-10-CM

## 2012-12-15 DIAGNOSIS — M8080XA Other osteoporosis with current pathological fracture, unspecified site, initial encounter for fracture: Secondary | ICD-10-CM

## 2012-12-15 HISTORY — DX: Gout, unspecified: M10.9

## 2012-12-15 MED ORDER — FENTANYL CITRATE 0.05 MG/ML IJ SOLN
50.0000 ug | Freq: Once | INTRAMUSCULAR | Status: AC
  Administered 2012-12-16: 50 ug via NASAL
  Filled 2012-12-15: qty 2

## 2012-12-15 NOTE — ED Notes (Signed)
Per EMS-Pt walking and tripped going up steps landing on left side- c/o of left hip pain.  ETOH present- NO LOC- Denies neck and back pain- No immobilization in route.  No other complaints at present. Abrasion to left elbow

## 2012-12-15 NOTE — ED Notes (Signed)
Bed:WA08<BR> Expected date:<BR> Expected time:<BR> Means of arrival:<BR> Comments:<BR> EMS

## 2012-12-15 NOTE — ED Provider Notes (Signed)
History     CSN: [id number]  Arrival date & time 12/15/12  2234   First MD Initiated Contact with Patient 12/15/12 2322      Chief Complaint  Patient presents with  . Fall  . Hip Pain    LEFT SIDE  . Abrasion  . Alcohol Intoxication    (Consider location/radiation/quality/duration/timing/severity/associated sxs/prior treatment) HPI  Past Medical History  Diagnosis Date  . Hypertension   . Gout     Past Surgical History  Procedure Laterality Date  . Leg surgery    . Wrist surgery      left wrist    No family history on file.  History  Substance Use Topics  . Smoking status: Never Smoker   . Smokeless tobacco: Not on file  . Alcohol Use: 1.5 oz/week    3 drink(s) per week     Comment: daily    OB History   Grav Para Term Preterm Abortions TAB SAB Ect Mult Living                  Review of Systems  Allergies  Review of patient's allergies indicates no known allergies.  Home Medications  No current outpatient prescriptions on file.  BP 126/68  Pulse 82  Temp(Src) 98.2 F (36.8 C) (Oral)  Resp 22  SpO2 97%  Physical Exam  ED Course  Procedures (including critical care time)  Labs Reviewed - No data to display No results found.   No diagnosis found.    MDM  Duplicate note. Please delete        Doug Sou, MD 12/16/12 0900

## 2012-12-16 ENCOUNTER — Encounter (HOSPITAL_COMMUNITY): Payer: Self-pay | Admitting: *Deleted

## 2012-12-16 ENCOUNTER — Inpatient Hospital Stay (HOSPITAL_COMMUNITY): Payer: Medicare Other

## 2012-12-16 DIAGNOSIS — I10 Essential (primary) hypertension: Secondary | ICD-10-CM | POA: Insufficient documentation

## 2012-12-16 DIAGNOSIS — M109 Gout, unspecified: Secondary | ICD-10-CM | POA: Diagnosis present

## 2012-12-16 DIAGNOSIS — M25529 Pain in unspecified elbow: Secondary | ICD-10-CM | POA: Diagnosis not present

## 2012-12-16 DIAGNOSIS — S72002A Fracture of unspecified part of neck of left femur, initial encounter for closed fracture: Secondary | ICD-10-CM

## 2012-12-16 DIAGNOSIS — E559 Vitamin D deficiency, unspecified: Secondary | ICD-10-CM | POA: Diagnosis present

## 2012-12-16 DIAGNOSIS — S72009A Fracture of unspecified part of neck of unspecified femur, initial encounter for closed fracture: Principal | ICD-10-CM

## 2012-12-16 DIAGNOSIS — K222 Esophageal obstruction: Secondary | ICD-10-CM | POA: Insufficient documentation

## 2012-12-16 DIAGNOSIS — E538 Deficiency of other specified B group vitamins: Secondary | ICD-10-CM | POA: Insufficient documentation

## 2012-12-16 DIAGNOSIS — K219 Gastro-esophageal reflux disease without esophagitis: Secondary | ICD-10-CM | POA: Diagnosis present

## 2012-12-16 DIAGNOSIS — S92309A Fracture of unspecified metatarsal bone(s), unspecified foot, initial encounter for closed fracture: Secondary | ICD-10-CM | POA: Diagnosis not present

## 2012-12-16 DIAGNOSIS — F329 Major depressive disorder, single episode, unspecified: Secondary | ICD-10-CM | POA: Insufficient documentation

## 2012-12-16 DIAGNOSIS — S92354A Nondisplaced fracture of fifth metatarsal bone, right foot, initial encounter for closed fracture: Secondary | ICD-10-CM

## 2012-12-16 DIAGNOSIS — M720 Palmar fascial fibromatosis [Dupuytren]: Secondary | ICD-10-CM | POA: Insufficient documentation

## 2012-12-16 DIAGNOSIS — S72033B Displaced midcervical fracture of unspecified femur, initial encounter for open fracture type I or II: Secondary | ICD-10-CM | POA: Diagnosis not present

## 2012-12-16 DIAGNOSIS — G4700 Insomnia, unspecified: Secondary | ICD-10-CM | POA: Insufficient documentation

## 2012-12-16 DIAGNOSIS — I480 Paroxysmal atrial fibrillation: Secondary | ICD-10-CM | POA: Insufficient documentation

## 2012-12-16 DIAGNOSIS — Z8673 Personal history of transient ischemic attack (TIA), and cerebral infarction without residual deficits: Secondary | ICD-10-CM | POA: Insufficient documentation

## 2012-12-16 DIAGNOSIS — S59919A Unspecified injury of unspecified forearm, initial encounter: Secondary | ICD-10-CM | POA: Diagnosis not present

## 2012-12-16 DIAGNOSIS — E785 Hyperlipidemia, unspecified: Secondary | ICD-10-CM | POA: Insufficient documentation

## 2012-12-16 DIAGNOSIS — S298XXA Other specified injuries of thorax, initial encounter: Secondary | ICD-10-CM | POA: Diagnosis not present

## 2012-12-16 DIAGNOSIS — F1010 Alcohol abuse, uncomplicated: Secondary | ICD-10-CM

## 2012-12-16 DIAGNOSIS — N6019 Diffuse cystic mastopathy of unspecified breast: Secondary | ICD-10-CM | POA: Insufficient documentation

## 2012-12-16 DIAGNOSIS — M25569 Pain in unspecified knee: Secondary | ICD-10-CM | POA: Diagnosis not present

## 2012-12-16 LAB — POCT I-STAT, CHEM 8
BUN: 4 mg/dL — ABNORMAL LOW (ref 6–23)
Creatinine, Ser: 0.8 mg/dL (ref 0.50–1.10)
Sodium: 135 mEq/L (ref 135–145)
TCO2: 24 mmol/L (ref 0–100)

## 2012-12-16 LAB — URINALYSIS, ROUTINE W REFLEX MICROSCOPIC
Glucose, UA: NEGATIVE mg/dL
Hgb urine dipstick: NEGATIVE
Protein, ur: NEGATIVE mg/dL

## 2012-12-16 LAB — CBC
HCT: 36.9 % (ref 36.0–46.0)
MCH: 31.4 pg (ref 26.0–34.0)
MCH: 32.3 pg (ref 26.0–34.0)
MCHC: 34.1 g/dL (ref 30.0–36.0)
MCV: 95.1 fL (ref 78.0–100.0)
Platelets: 216 10*3/uL (ref 150–400)
Platelets: 249 10*3/uL (ref 150–400)
RDW: 12.9 % (ref 11.5–15.5)
RDW: 13 % (ref 11.5–15.5)

## 2012-12-16 LAB — TYPE AND SCREEN: ABO/RH(D): O POS

## 2012-12-16 MED ORDER — LORAZEPAM 1 MG PO TABS: 1.0000 mg | ORAL_TABLET | Freq: Four times a day (QID) | ORAL | Status: AC | PRN

## 2012-12-16 MED ORDER — PANTOPRAZOLE SODIUM 40 MG PO TBEC
40.0000 mg | DELAYED_RELEASE_TABLET | Freq: Every day | ORAL | Status: DC
  Administered 2012-12-16 – 2012-12-19 (×3): 40 mg via ORAL
  Filled 2012-12-16 (×3): qty 1

## 2012-12-16 MED ORDER — ENOXAPARIN SODIUM 40 MG/0.4ML ~~LOC~~ SOLN
40.0000 mg | SUBCUTANEOUS | Status: DC
  Administered 2012-12-16: 40 mg via SUBCUTANEOUS
  Filled 2012-12-16 (×2): qty 0.4

## 2012-12-16 MED ORDER — VITAMIN D (ERGOCALCIFEROL) 1.25 MG (50000 UNIT) PO CAPS
50000.0000 [IU] | ORAL_CAPSULE | ORAL | Status: DC
  Administered 2012-12-16: 50000 [IU] via ORAL
  Filled 2012-12-16 (×2): qty 1

## 2012-12-16 MED ORDER — LORAZEPAM 2 MG/ML IJ SOLN
1.0000 mg | Freq: Four times a day (QID) | INTRAMUSCULAR | Status: AC | PRN
  Administered 2012-12-16 (×2): 1 mg via INTRAVENOUS
  Filled 2012-12-16 (×2): qty 1

## 2012-12-16 MED ORDER — ACETAMINOPHEN 325 MG PO TABS: 325.0000 mg | ORAL_TABLET | Freq: Four times a day (QID) | ORAL | Status: DC | PRN

## 2012-12-16 MED ORDER — MORPHINE SULFATE 2 MG/ML IJ SOLN
INTRAMUSCULAR | Status: AC
  Administered 2012-12-16: 2 mg via INTRAVENOUS
  Filled 2012-12-16: qty 1

## 2012-12-16 MED ORDER — MORPHINE SULFATE 2 MG/ML IJ SOLN
0.5000 mg | INTRAMUSCULAR | Status: DC | PRN
  Administered 2012-12-16 – 2012-12-17 (×4): 0.5 mg via INTRAVENOUS
  Filled 2012-12-16 (×4): qty 1

## 2012-12-16 MED ORDER — VITAMIN B-1 100 MG PO TABS
100.0000 mg | ORAL_TABLET | Freq: Every day | ORAL | Status: DC
  Administered 2012-12-16 – 2012-12-19 (×3): 100 mg via ORAL
  Filled 2012-12-16 (×4): qty 1

## 2012-12-16 MED ORDER — ADULT MULTIVITAMIN W/MINERALS CH
1.0000 | ORAL_TABLET | Freq: Every day | ORAL | Status: DC
  Administered 2012-12-16 – 2012-12-19 (×3): 1 via ORAL
  Filled 2012-12-16 (×4): qty 1

## 2012-12-16 MED ORDER — VITAMIN B-12 1000 MCG PO TABS
1000.0000 ug | ORAL_TABLET | Freq: Every day | ORAL | Status: DC
  Administered 2012-12-16 – 2012-12-19 (×3): 1000 ug via ORAL
  Filled 2012-12-16 (×4): qty 1

## 2012-12-16 MED ORDER — VITAMIN D3 25 MCG (1000 UNIT) PO TABS
2000.0000 [IU] | ORAL_TABLET | Freq: Every day | ORAL | Status: DC
  Administered 2012-12-18 – 2012-12-19 (×2): 2000 [IU] via ORAL
  Filled 2012-12-16 (×3): qty 2

## 2012-12-16 MED ORDER — MORPHINE SULFATE 4 MG/ML IJ SOLN
4.0000 mg | Freq: Once | INTRAMUSCULAR | Status: AC
  Administered 2012-12-16: 4 mg via INTRAVENOUS
  Filled 2012-12-16: qty 1

## 2012-12-16 MED ORDER — FOLIC ACID 1 MG PO TABS
1.0000 mg | ORAL_TABLET | Freq: Every day | ORAL | Status: DC
  Administered 2012-12-16 – 2012-12-19 (×3): 1 mg via ORAL
  Filled 2012-12-16 (×4): qty 1

## 2012-12-16 MED ORDER — HEPARIN SODIUM (PORCINE) 5000 UNIT/ML IJ SOLN
5000.0000 [IU] | Freq: Three times a day (TID) | INTRAMUSCULAR | Status: DC
  Administered 2012-12-16 (×2): 5000 [IU] via SUBCUTANEOUS
  Filled 2012-12-16 (×4): qty 1

## 2012-12-16 MED ORDER — THIAMINE HCL 100 MG/ML IJ SOLN
100.0000 mg | Freq: Every day | INTRAMUSCULAR | Status: DC
  Filled 2012-12-16 (×4): qty 1

## 2012-12-16 MED ORDER — SODIUM CHLORIDE 0.9 % IV SOLN: INTRAVENOUS | Status: DC

## 2012-12-16 MED ORDER — HYDROCODONE-ACETAMINOPHEN 5-325 MG PO TABS
1.0000 | ORAL_TABLET | Freq: Four times a day (QID) | ORAL | Status: DC | PRN
  Administered 2012-12-16 – 2012-12-18 (×6): 2 via ORAL
  Filled 2012-12-16 (×6): qty 2

## 2012-12-16 NOTE — Care Management Note (Signed)
    Page 1 of 1   12/16/2012     10:41:41 AM   CARE MANAGEMENT NOTE 12/16/2012  Patient:  [name], [name]   Account Number:  [id number]  Date Initiated:  12/16/2012  Documentation initiated by:  Lorenda Ishihara  Subjective/Objective Assessment:   72 yo female admitted s/p fall with hip fracture. PTA lived at home alone.     Action/Plan:   Home when stable   Anticipated DC Date:  12/16/2012   Anticipated DC Plan:  ACUTE TO ACUTE TRANS      DC Planning Services  CM consult      Choice offered to / List presented to:             Status of service:  Completed, signed off Medicare Important Message given?   (If response is "NO", the following Medicare IM given date fields will be blank) Date Medicare IM given:   Date Additional Medicare IM given:    Discharge Disposition:  ACUTE TO ACUTE TRANS  Per UR Regulation:  Reviewed for med. necessity/level of care/duration of stay  If discussed at Long Length of Stay Meetings, dates discussed:    Comments:  12-16-12 Lorenda Ishihara RN CM Pt will need to be transferred (for Surgery) to [facility] hospital this afternoon/evening and she will remain there post op.

## 2012-12-16 NOTE — Progress Notes (Signed)
Report given to care link, patient is alert and oriented, ,edicated for pain prior to transport to [place] Stanford Breed RN 12-16-2012 18:31pm

## 2012-12-16 NOTE — ED Notes (Signed)
[name] RN aware of recent medication, PCA order and pt current pain score

## 2012-12-16 NOTE — Progress Notes (Signed)
[facility] Progress Note  Full eval to follow  Pt has been posted for surgery tomorrow at 0800.  Pt had to be posted at [place] as we do not have block OR time at [facility] hospital.   Pt will need to be transferred to [facility] hospital this afternoon/evening and she will remain there post op.    Please call with questions  Mearl Latin, PA-C [facility] [phone] (P) 12/16/2012 9:57 AM

## 2012-12-16 NOTE — Progress Notes (Signed)
Clinical Social Work Department CLINICAL SOCIAL WORK PLACEMENT NOTE 12/16/2012  Patient:  [name], [name]  Account Number:  [id number] Admit date:  12/15/2012  Clinical Social Worker:  Cori Razor, LCSW  Date/time:  12/16/2012 02:53 PM  Clinical Social Work is seeking post-discharge placement for this patient at the following level of care:   SKILLED NURSING   (*CSW will update this form in Epic as items are completed)   12/16/2012  Patient/family provided with [facility] Department of Clinical Social Work's list of facilities offering this level of care within the geographic area requested by the patient (or if unable, by the patient's family).  12/16/2012  Patient/family informed of their freedom to choose among providers that offer the needed level of care, that participate in Medicare, Medicaid or managed care program needed by the patient, have an available bed and are willing to accept the patient.    Patient/family informed of [facility]' ownership interest in [facility], as well as of the fact that they are under no obligation to receive care at this facility.  PASARR submitted to EDS on  PASARR number received from [employer] on 11/10/2005  FL2 transmitted to all facilities in geographic area requested by pt/family on  12/16/2012 FL2 transmitted to all facilities within larger geographic area on   Patient informed that his/her managed care company has contracts with or will negotiate with  certain facilities, including the following:     Patient/family informed of bed offers received:   Patient chooses bed at  Physician recommends and patient chooses bed at    Patient to be transferred to  on   Patient to be transferred to facility by   The following physician request were entered in Epic:   Additional Comments: Cori Razor LCSW [phone]

## 2012-12-16 NOTE — Progress Notes (Signed)
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 She was seen in our office just one week ago admitting to 2-6 drinks a day. 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 She lives alone and her son came with her to the office one week ago wanting her to move in with him but she declined even though my recommendation that she move in with his son so that she could be monitored not only for alcohol use but also for compliance is medications if necessary et Karie Soda. Unfortunately, last night at around 7:00 she tripped on uneven surface covered with gravel and fell and broke her hip. She is admitted to a beer at 2 PM and one around 7 PM. She walks daily and, at least until recently, has been able to take care of herself in her own home. Her alcohol use may be becoming too heavy to function normally. She does have a history of an old left frontal stroke  Objective: Weight change:   Intake/Output Summary (Last 24 hours) at 12/16/12 1903 Last data filed at 12/16/12 1800  Gross per 24 hour  Intake      0 ml  Output   2700 ml  Net  -2700 ml   Filed Vitals:   12/16/12 1000 12/16/12 1359 12/16/12 1730 12/16/12 1901  BP: 154/81 156/72 175/95 179/86  Pulse: 80 78 78 85  Temp: 98.7 F (37.1 C) 98.2 F (36.8 C) 98.7 F (37.1 C) 100.2 F (37.9 C)  TempSrc: Oral Oral Oral   Resp: 16 16 16 16   Height:      Weight:      SpO2: 94% 91% 92% 91%   General: Alert awake and oriented x2-3. Certainly to name and place. HEENT: Sclera are muddy and oropharynx is moist Neck: Supple without masses or obvious lymphadenopathy Heart: Regular rhythm without murmurs or gallops Lungs: Clear to auscultation bilaterally Extremities: No peripheral edema. Abrasion on left elbow. Left hip area tender to  palpation and slightly swollen with known left hip fracture. Tender in the right mid foot to palpation Neurological: Alert but mildly tremulous. No focal deficits Psych: Alert and calm but tremulous Skin: Warm and dry   Lab Results: Results for orders placed during the hospital encounter of 12/15/12 (from the past 48 hour(s))  CBC     Status: None   Collection Time    12/16/12 12:29 AM      Result Value Range   WBC 7.8  4.0 - 10.5 K/uL   RBC 4.21  3.87 - 5.11 MIL/uL   Hemoglobin 13.6  12.0 - 15.0 g/dL   HCT 45.4  09.8 - 11.9 %   MCV 94.8  78.0 - 100.0 fL   MCH 32.3  26.0 - 34.0 pg   MCHC 34.1  30.0 - 36.0 g/dL   RDW 14.7  82.9 - 56.2 %   Platelets 249  150 - 400 K/uL  TYPE AND SCREEN     Status: None   Collection Time    12/16/12 12:29 AM      Result Value Range   ABO/RH(D) O POS     Antibody Screen NEG     Sample Expiration 12/19/2012    ABO/RH     Status: None   Collection Time    12/16/12 12:29 AM      Result  Value Range   ABO/RH(D) O POS    POCT I-STAT, CHEM 8     Status: Abnormal   Collection Time    12/16/12 12:38 AM      Result Value Range   Sodium 135  135 - 145 mEq/L   Potassium 4.1  3.5 - 5.1 mEq/L   Chloride 100  96 - 112 mEq/L   BUN 4 (*) 6 - 23 mg/dL   Creatinine, Ser 4.09  0.50 - 1.10 mg/dL   Glucose, Bld 811 (*) 70 - 99 mg/dL   Calcium, Ion 9.14 (*) 1.13 - 1.30 mmol/L   TCO2 24  0 - 100 mmol/L   Hemoglobin 14.3  12.0 - 15.0 g/dL   HCT 78.2  95.6 - 21.3 %  URINALYSIS, ROUTINE W REFLEX MICROSCOPIC     Status: None   Collection Time    12/16/12 12:40 AM      Result Value Range   Color, Urine YELLOW  YELLOW   APPearance CLEAR  CLEAR   Specific Gravity, Urine 1.010  1.005 - 1.030   pH 5.0  5.0 - 8.0   Glucose, UA NEGATIVE  NEGATIVE mg/dL   Hgb urine dipstick NEGATIVE  NEGATIVE   Bilirubin Urine NEGATIVE  NEGATIVE   Ketones, ur NEGATIVE  NEGATIVE mg/dL   Protein, ur NEGATIVE  NEGATIVE mg/dL   Urobilinogen, UA 0.2  0.0 - 1.0 mg/dL   Nitrite  NEGATIVE  NEGATIVE   Leukocytes, UA NEGATIVE  NEGATIVE   Comment: MICROSCOPIC NOT DONE ON URINES WITH NEGATIVE PROTEIN, BLOOD, LEUKOCYTES, NITRITE, OR GLUCOSE <1000 mg/dL.  CBC     Status: Abnormal   Collection Time    12/16/12  5:21 AM      Result Value Range   WBC 10.7 (*) 4.0 - 10.5 K/uL   RBC 3.88  3.87 - 5.11 MIL/uL   Hemoglobin 12.2  12.0 - 15.0 g/dL   HCT 08.6  57.8 - 46.9 %   MCV 95.1  78.0 - 100.0 fL   MCH 31.4  26.0 - 34.0 pg   MCHC 33.1  30.0 - 36.0 g/dL   RDW 62.9  52.8 - 41.3 %   Platelets 216  150 - 400 K/uL  CREATININE, SERUM     Status: None   Collection Time    12/16/12  5:21 AM      Result Value Range   Creatinine, Ser 0.54  0.50 - 1.10 mg/dL   GFR calc non Af Amer >90  >90 mL/min   GFR calc Af Amer >90  >90 mL/min   Comment:            The eGFR has been calculated     using the CKD EPI equation.     This calculation has not been     validated in all clinical     situations.     eGFR's persistently     <90 mL/min signify     possible Chronic Kidney Disease.    Studies/Results: Dg Chest 1 View  12/16/2012  *RADIOLOGY REPORT*  Clinical Data: Fall, left hip fracture.  CHEST - 1 VIEW  Comparison: 06/21/2007  Findings: Heart and mediastinal contours are within normal limits. No focal opacities or effusions.  No acute bony abnormality.  IMPRESSION: No active cardiopulmonary disease.   Original Report Authenticated By: Charlett Nose, M.D.    Dg Elbow Complete Left  12/16/2012  *RADIOLOGY REPORT*  Clinical Data: Fall, pain.  LEFT ELBOW - COMPLETE 3+ VIEW  Comparison:  None.  Findings: No acute bony abnormality.  Specifically, no fracture, subluxation, or dislocation.  Soft tissues are intact.  No joint effusion.  IMPRESSION: No acute bony abnormality.   Original Report Authenticated By: Charlett Nose, M.D.    Dg Hip Complete Left  12/16/2012  *RADIOLOGY REPORT*  Clinical Data: Fall, left hip pain.  LEFT HIP - COMPLETE 2+ VIEW  Comparison: None.  Findings: There is a left  femoral neck fracture with varus angulation.  Mild symmetric degenerative changes in the hips bilaterally.  No subluxation or dislocation.  IMPRESSION: Left femoral neck fracture with varus angulation.   Original Report Authenticated By: Charlett Nose, M.D.    Dg Knee Left Port  12/16/2012  *RADIOLOGY REPORT*  Clinical Data: Left knee pain.  Tibial plateau fracture.  PORTABLE LEFT KNEE - 1-2 VIEW  Comparison: 04/19/2006.  Findings: Diffuse osteopenia is present.  The alignment the knee appears anatomic.  Mottling of the musculature of the thigh and leg suggests disuse or denervation atrophy.  No gross knee effusion. Postoperative changes of the tibial plateau with bone cement in the medial plateau and lateral buttress plate and screw fixation.  No complicating features.  Tibial plateau fracture appears healed.  IMPRESSION: Healed left tibial plateau fracture.  Osteopenia and diffuse atrophy of the left lower extremity.  No acute abnormality.   Original Report Authenticated By: Andreas Newport, M.D.    Medications: Scheduled Meds: . sodium chloride   Intravenous STAT  . enoxaparin (LOVENOX) injection  40 mg Subcutaneous Q24H  . folic acid  1 mg Oral Daily  . multivitamin with minerals  1 tablet Oral Daily  . thiamine  100 mg Oral Daily   Or  . thiamine  100 mg Intravenous Daily   Continuous Infusions:  PRN Meds:.HYDROcodone-acetaminophen, LORazepam, LORazepam, morphine injection  Assessment/Plan:  Principal Problem:  Hip fracture, left  Orthopedicsurgery planned for a.m. Of Dec 17, 2012 at [facility] for left hip repair. Patient being transferred this evening   Cleared from medical standpoint for the surgery, no active cardiac issues, functionally active  Continue pain control, DVT prophylaxis  Active Problems: History of esophageal stricture and GERD - PPI therapy b.i.d. History of B12 deficiency - check B12 level History vitamin D deficiency - check vitamin D level   Hypertension: Stable - not on any medications - but will follow carefully Gout: Stable  Fracture of fifth right metatarsal bone - Continue postop shoe PT/OT evaluation once stable from orthopedic standpoint  Alcohol abuse  Continue CIWA protocol with Ativan, thiamine, folate, IVF  DVT prophylaxis: heparin SQ  CODE STATUS: Full code    LOS: 1 day   Pearla Dubonnet, MD 12/16/2012, 7:03 PM

## 2012-12-16 NOTE — Consult Note (Signed)
Orthopaedic Trauma Service Consult   Reason for Consult: Left femoral neck fracture  Referring Physician:  [facility]    HPI:   Pt is a 72 y/o female known to the [facility] for surgery to L knee back in 2007.  Pt was at home yesterday when she sustained a ground level fall resulting in an injury to her L hip.  Pt was brought to [facility] hospital for evaluation where whe was found to have a L femoral neck fracture.  Ortho consulted for definitive management. Pt admitted to Internal medicine  Pt is in 1540, comfortable with minimal complaints  Denies numbness or tingling  No CP or SOB  Reports only L hip pain, denies pain elsewhere  Denies blacking out or passing out before or after fall.   Last BM was day she came to hospital        Pt also seen at ED on 4/28 after sustaining a fall as well, dx with nondisplaced R 5th MTT fx, non-op tx     Past Medical History  Diagnosis Date  . Hypertension   . Gout     Past Surgical History  Procedure Laterality Date  . Leg surgery    . Wrist surgery      left wrist    Family History  Problem Relation Age of Onset  . Stroke Mother     Social History:  reports that she has never smoked. She does not have any smokeless tobacco history on file.  Pt drinks daily, 2 cans of beer daily (12 oz cans)  Allergies: No Known Allergies  Medications:   I have reviewed the patient's current medications. Prior to Admission:  No prescriptions prior to admission    Results for orders placed during the hospital encounter of 12/15/12 (from the past 48 hour(s))  CBC     Status: None   Collection Time    12/16/12 12:29 AM      Result Value Range   WBC 7.8  4.0 - 10.5 K/uL   RBC 4.21  3.87 - 5.11 MIL/uL   Hemoglobin 13.6  12.0 - 15.0 g/dL   HCT 08.6  57.8 - 46.9 %   MCV 94.8  78.0 - 100.0 fL   MCH 32.3  26.0 - 34.0 pg   MCHC 34.1  30.0 - 36.0 g/dL   RDW 62.9  52.8 - 41.3 %   Platelets 249  150 - 400 K/uL  TYPE AND SCREEN     Status: None   Collection Time    12/16/12 12:29 AM      Result Value Range   ABO/RH(D) O POS     Antibody Screen NEG     Sample Expiration 12/19/2012    ABO/RH     Status: None   Collection Time    12/16/12 12:29 AM      Result Value Range   ABO/RH(D) O POS    POCT I-STAT, CHEM 8     Status: Abnormal   Collection Time    12/16/12 12:38 AM      Result Value Range   Sodium 135  135 - 145 mEq/L   Potassium 4.1  3.5 - 5.1 mEq/L   Chloride 100  96 - 112 mEq/L   BUN 4 (*) 6 - 23 mg/dL   Creatinine, Ser 2.44  0.50 - 1.10 mg/dL   Glucose, Bld 010 (*) 70 - 99 mg/dL   Calcium, Ion 2.72 (*) 1.13 - 1.30 mmol/L   TCO2 24  0 - 100 mmol/L   Hemoglobin 14.3  12.0 - 15.0 g/dL   HCT 40.9  81.1 - 91.4 %  URINALYSIS, ROUTINE W REFLEX MICROSCOPIC     Status: None   Collection Time    12/16/12 12:40 AM      Result Value Range   Color, Urine YELLOW  YELLOW   APPearance CLEAR  CLEAR   Specific Gravity, Urine 1.010  1.005 - 1.030   pH 5.0  5.0 - 8.0   Glucose, UA NEGATIVE  NEGATIVE mg/dL   Hgb urine dipstick NEGATIVE  NEGATIVE   Bilirubin Urine NEGATIVE  NEGATIVE   Ketones, ur NEGATIVE  NEGATIVE mg/dL   Protein, ur NEGATIVE  NEGATIVE mg/dL   Urobilinogen, UA 0.2  0.0 - 1.0 mg/dL   Nitrite NEGATIVE  NEGATIVE   Leukocytes, UA NEGATIVE  NEGATIVE   Comment: MICROSCOPIC NOT DONE ON URINES WITH NEGATIVE PROTEIN, BLOOD, LEUKOCYTES, NITRITE, OR GLUCOSE <1000 mg/dL.  CBC     Status: Abnormal   Collection Time    12/16/12  5:21 AM      Result Value Range   WBC 10.7 (*) 4.0 - 10.5 K/uL   RBC 3.88  3.87 - 5.11 MIL/uL   Hemoglobin 12.2  12.0 - 15.0 g/dL   HCT 78.2  95.6 - 21.3 %   MCV 95.1  78.0 - 100.0 fL   MCH 31.4  26.0 - 34.0 pg   MCHC 33.1  30.0 - 36.0 g/dL   RDW 08.6  57.8 - 46.9 %   Platelets 216  150 - 400 K/uL  CREATININE, SERUM     Status: None   Collection Time    12/16/12  5:21 AM      Result Value Range   Creatinine, Ser 0.54  0.50 - 1.10 mg/dL   GFR calc non Af Amer >90  >90 mL/min   GFR calc  Af Amer >90  >90 mL/min   Comment:            The eGFR has been calculated     using the CKD EPI equation.     This calculation has not been     validated in all clinical     situations.     eGFR's persistently     <90 mL/min signify     possible Chronic Kidney Disease.    Dg Chest 1 View  12/16/2012  *RADIOLOGY REPORT*  Clinical Data: Fall, left hip fracture.  CHEST - 1 VIEW  Comparison: 06/21/2007  Findings: Heart and mediastinal contours are within normal limits. No focal opacities or effusions.  No acute bony abnormality.  IMPRESSION: No active cardiopulmonary disease.   Original Report Authenticated By: Charlett Nose, M.D.    Dg Elbow Complete Left  12/16/2012  *RADIOLOGY REPORT*  Clinical Data: Fall, pain.  LEFT ELBOW - COMPLETE 3+ VIEW  Comparison: None.  Findings: No acute bony abnormality.  Specifically, no fracture, subluxation, or dislocation.  Soft tissues are intact.  No joint effusion.  IMPRESSION: No acute bony abnormality.   Original Report Authenticated By: Charlett Nose, M.D.    Dg Hip Complete Left  12/16/2012  *RADIOLOGY REPORT*  Clinical Data: Fall, left hip pain.  LEFT HIP - COMPLETE 2+ VIEW  Comparison: None.  Findings: There is a left femoral neck fracture with varus angulation.  Mild symmetric degenerative changes in the hips bilaterally.  No subluxation or dislocation.  IMPRESSION: Left femoral neck fracture with varus angulation.   Original Report Authenticated By: Charlett Nose,  M.D.    Dg Knee Left Port  12/16/2012  *RADIOLOGY REPORT*  Clinical Data: Left knee pain.  Tibial plateau fracture.  PORTABLE LEFT KNEE - 1-2 VIEW  Comparison: 04/19/2006.  Findings: Diffuse osteopenia is present.  The alignment the knee appears anatomic.  Mottling of the musculature of the thigh and leg suggests disuse or denervation atrophy.  No gross knee effusion. Postoperative changes of the tibial plateau with bone cement in the medial plateau and lateral buttress plate and screw fixation.  No  complicating features.  Tibial plateau fracture appears healed.  IMPRESSION: Healed left tibial plateau fracture.  Osteopenia and diffuse atrophy of the left lower extremity.  No acute abnormality.   Original Report Authenticated By: Andreas Newport, M.D.     Review of Systems  Constitutional: Negative for fever and chills.  Respiratory: Negative for shortness of breath and wheezing.   Cardiovascular: Negative for chest pain.  Gastrointestinal: Negative for nausea, vomiting, abdominal pain and constipation.  Musculoskeletal:       L hip pain  Neurological: Negative for tingling and headaches.  Endo/Heme/Allergies: Does not bruise/bleed easily.   Blood pressure 156/72, pulse 78, temperature 98.2 F (36.8 C), temperature source Oral, resp. rate 16, height 5\' 10"  (1.778 m), weight 65 kg (143 lb 4.8 oz), SpO2 91.00%. Physical Exam  Constitutional: She is cooperative. No distress.  Pleasant 72 y/o female  Neck: Normal range of motion and full passive range of motion without pain. No spinous process tenderness and no muscular tenderness present. Normal range of motion present.  Cardiovascular:  s1 and s2, reg   Respiratory:  Clear breath sounds B   GI:  Soft, NTND, + BS   Musculoskeletal:  B UEx and R LEX- no acute findings, motor and sensory functions grossly intact + pulses  exts are wasrm  Left LEx   Hip TTP with palpation, pain with movement    Knee and ankle nontender   Thigh and lower leg nontender   Foot w/o pain   No gross deformities noted   Ext warm   + DP pulse    Compartments soft and NT    DPN, SPN, TN sensation intact     EHL, FHL, AT, PT, peroneals, gastroc motor intact    Ext warm    Healed surgical wound to L knee noted    No significant swelling     ROM of knee not assessed du to hip fracture  Pelvis   No instability or pain with evaluation         Neurological: She is alert.  Skin: Skin is warm and intact.    Assessment/Plan:  72 y/o female s/p  fall   1. Fall 2. L femoral neck fracture  Pt will need surgical intervention to address her fracture  Pt will need L hip hemiarthroplasty   Plan to go to the OR tomorrow at 0800  Pt will be transferred [facility] to [facility] for this as we do not have scheduled OR time at [facility]  Continue bedrest for now  Pt will be WBAT post op with posterior hip precautions  3. R 5th MTT fx  Non-op   Post op shoe when therapies start  WBAT    4. Gout, HTN  Monitor  5. DVT/PE prophylaxis  Will d/c heparin  1 dose of lovenox this afternoon  Resume lovenox on POD 1    6. Activity  Bed rest for now  PT/OT post op  7. FEN  Reg diet for  now  NPO after NM 8. misc  Will check labs to eval bone health while in hospital as well  9.Dispo  Transfer to [facility] today  OR tomorrow   Pt will likely need [facility]  Mearl Latin, PA-C [facility] [phone] (P) 12/16/2012, 4:14 PM

## 2012-12-16 NOTE — ED Provider Notes (Signed)
History     CSN: [id number]  Arrival date & time 12/15/12  2234   First MD Initiated Contact with Patient 12/15/12 2322      Chief Complaint  Patient presents with  . Fall  . Hip Pain    LEFT SIDE  . Abrasion  . Alcohol Intoxication    (Consider location/radiation/quality/duration/timing/severity/associated sxs/prior treatment) HPI Asian tripped and fell walking up steps 7 PM tonight injuring left elbow and left hip. No other injury. She suffered an abrasion to her left elbow as result fall she did not strike her head denies loss of consciousness admits to drinking 2 alcoholic drinks, last one at approximately 7 PM tonight. Pain worse with movement or changing position improved with remaining still. Past Medical History  Diagnosis Date  . Hypertension   . Gout     Past Surgical History  Procedure Laterality Date  . Leg surgery    . Wrist surgery      left wrist    No family history on file.  History  Substance Use Topics  . Smoking status: Never Smoker   . Smokeless tobacco: Not on file  . Alcohol Use: 1.5 oz/week    3 drink(s) per week     Comment: daily    OB History   Grav Para Term Preterm Abortions TAB SAB Ect Mult Living                  Review of Systems  Musculoskeletal: Positive for arthralgias.  Skin: Positive for wound.       Abrasion to left elbow    Allergies  Review of patient's allergies indicates no known allergies.  Home Medications  No current outpatient prescriptions on file.  BP 126/68  Pulse 82  Temp(Src) 98.2 F (36.8 C) (Oral)  Resp 22  SpO2 97%  Physical Exam  Nursing note and vitals reviewed. Constitutional: She is oriented to person, place, and time. She appears well-developed and well-nourished. She appears distressed.  Uncomfortable Glasgow Coma Score 15  HENT:  Head: Normocephalic and atraumatic.  Eyes: Conjunctivae are normal. Pupils are equal, round, and reactive to light.  Neck: Neck supple. No tracheal  deviation present. No thyromegaly present.  Cardiovascular: Normal rate and regular rhythm.   No murmur heard. Pulmonary/Chest: Effort normal and breath sounds normal.  Abdominal: Soft. Bowel sounds are normal. She exhibits no distension. There is no tenderness.  Musculoskeletal: Normal range of motion. She exhibits no edema and no tenderness.  Pelvis stable entire spine nontender  Neurological: She is alert and oriented to person, place, and time. Coordination normal.  Skin: Skin is warm and dry. No rash noted.  Left upper extremity :Abrasion over the posterior aspect of elbow full range of motion no tenderness Left lower extremity tenderness at hip pain at hip on him tomorrow patient DP pulse 2+  Right upper extremity and right lower extremity without contusion abrasion or tenderness neurovascularly intact   Psychiatric: She has a normal mood and affect.    ED Course  Procedures (including critical care time)  Labs Reviewed  CBC  TYPE AND SCREEN   Results for orders placed during the hospital encounter of 12/15/12  CBC      Result Value Range   WBC 7.8  4.0 - 10.5 K/uL   RBC 4.21  3.87 - 5.11 MIL/uL   Hemoglobin 13.6  12.0 - 15.0 g/dL   HCT 57.8  46.9 - 62.9 %   MCV 94.8  78.0 - 100.0 fL  MCH 32.3  26.0 - 34.0 pg   MCHC 34.1  30.0 - 36.0 g/dL   RDW 78.2  95.6 - 21.3 %   Platelets 249  150 - 400 K/uL  URINALYSIS, ROUTINE W REFLEX MICROSCOPIC      Result Value Range   Color, Urine YELLOW  YELLOW   APPearance CLEAR  CLEAR   Specific Gravity, Urine 1.010  1.005 - 1.030   pH 5.0  5.0 - 8.0   Glucose, UA NEGATIVE  NEGATIVE mg/dL   Hgb urine dipstick NEGATIVE  NEGATIVE   Bilirubin Urine NEGATIVE  NEGATIVE   Ketones, ur NEGATIVE  NEGATIVE mg/dL   Protein, ur NEGATIVE  NEGATIVE mg/dL   Urobilinogen, UA 0.2  0.0 - 1.0 mg/dL   Nitrite NEGATIVE  NEGATIVE   Leukocytes, UA NEGATIVE  NEGATIVE  POCT I-STAT, CHEM 8      Result Value Range   Sodium 135  135 - 145 mEq/L    Potassium 4.1  3.5 - 5.1 mEq/L   Chloride 100  96 - 112 mEq/L   BUN 4 (*) 6 - 23 mg/dL   Creatinine, Ser 0.86  0.50 - 1.10 mg/dL   Glucose, Bld 578 (*) 70 - 99 mg/dL   Calcium, Ion 4.69 (*) 1.13 - 1.30 mmol/L   TCO2 24  0 - 100 mmol/L   Hemoglobin 14.3  12.0 - 15.0 g/dL   HCT 62.9  52.8 - 41.3 %  TYPE AND SCREEN      Result Value Range   ABO/RH(D) O POS     Antibody Screen NEG     Sample Expiration 12/19/2012    ABO/RH      Result Value Range   ABO/RH(D) O POS     Dg Chest 1 View  12/16/2012  *RADIOLOGY REPORT*  Clinical Data: Fall, left hip fracture.  CHEST - 1 VIEW  Comparison: 06/21/2007  Findings: Heart and mediastinal contours are within normal limits. No focal opacities or effusions.  No acute bony abnormality.  IMPRESSION: No active cardiopulmonary disease.   Original Report Authenticated By: Charlett Nose, M.D.    Dg Elbow Complete Left  12/16/2012  *RADIOLOGY REPORT*  Clinical Data: Fall, pain.  LEFT ELBOW - COMPLETE 3+ VIEW  Comparison: None.  Findings: No acute bony abnormality.  Specifically, no fracture, subluxation, or dislocation.  Soft tissues are intact.  No joint effusion.  IMPRESSION: No acute bony abnormality.   Original Report Authenticated By: Charlett Nose, M.D.    Dg Hip Complete Left  12/16/2012  *RADIOLOGY REPORT*  Clinical Data: Fall, left hip pain.  LEFT HIP - COMPLETE 2+ VIEW  Comparison: None.  Findings: There is a left femoral neck fracture with varus angulation.  Mild symmetric degenerative changes in the hips bilaterally.  No subluxation or dislocation.  IMPRESSION: Left femoral neck fracture with varus angulation.   Original Report Authenticated By: Charlett Nose, M.D.    Dg Foot Complete Right  12/09/2012  *RADIOLOGY REPORT*  Clinical Data: Fall last night with pain and soft tissue swelling in the lateral right foot.  RIGHT FOOT COMPLETE - 3+ VIEW  Comparison: 07/16/2012  Findings: Diffuse bone demineralization.  Transverse fracture of the distal right fifth  metatarsal shaft with mild medial angulation of the distal fracture fragment.  Soft tissue swelling is present. Degenerative changes in the first metatarsophalangeal joint and in the interphalangeal joints.  Vascular calcifications.  Achilles calcaneal spur.  No destructive or expansile bone lesions.  No radiopaque soft tissue foreign bodies.  IMPRESSION: Acute transverse fracture of the distal right fifth metatarsal shaft.  Diffuse bone demineralization.   Original Report Authenticated By: Burman Nieves, M.D.     Dg Chest 1 View  12/16/2012  *RADIOLOGY REPORT*  Clinical Data: Fall, left hip fracture.  CHEST - 1 VIEW  Comparison: 06/21/2007  Findings: Heart and mediastinal contours are within normal limits. No focal opacities or effusions.  No acute bony abnormality.  IMPRESSION: No active cardiopulmonary disease.   Original Report Authenticated By: Charlett Nose, M.D.    Dg Elbow Complete Left  12/16/2012  *RADIOLOGY REPORT*  Clinical Data: Fall, pain.  LEFT ELBOW - COMPLETE 3+ VIEW  Comparison: None.  Findings: No acute bony abnormality.  Specifically, no fracture, subluxation, or dislocation.  Soft tissues are intact.  No joint effusion.  IMPRESSION: No acute bony abnormality.   Original Report Authenticated By: Charlett Nose, M.D.    Dg Hip Complete Left  12/16/2012  *RADIOLOGY REPORT*  Clinical Data: Fall, left hip pain.  LEFT HIP - COMPLETE 2+ VIEW  Comparison: None.  Findings: There is a left femoral neck fracture with varus angulation.  Mild symmetric degenerative changes in the hips bilaterally.  No subluxation or dislocation.  IMPRESSION: Left femoral neck fracture with varus angulation.   Original Report Authenticated By: Charlett Nose, M.D.     xrays viewed by me No diagnosis found. 12: 30 am pain improved after treatment with intravenous fentanyl. At 1:25 AM requesting more pain medicine. Morphine ordered.  1:45 AM patient resting comfortably pain improved after morphine alert Glasgow Coma  Score 15 Spoke with Dr.Tooke, he will communicate on to Dr.Handy of need for orthopedic surgery. Patient will likely have surgery later today.  MDM  Spoke with Dr.RAI who will admit patient to MedSurg floor Diagnosis #1 closed left femoral neck fracture #2 abrasion to left elbow        Doug Sou, MD 12/16/12 0960

## 2012-12-16 NOTE — H&P (Signed)
History and Physical       Hospital Admission Note Date: 12/16/2012  Patient name: [name] Medical record number: [id number] Date of birth: 09-23-1940 Age: 72 y.o. Gender: female PCP: GATES,ROBERT NEVILL, MD    Chief Complaint:  Mechanical fall with left-sided hip pain  HPI: Patient is a 72 year old female with history of hypertension, gout who is otherwise functionally active, 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 Patient reported that around 7 PM last night she was walking on an uneven surface with gravel and she tripped. She denied losing consciousness, dizziness or lightheadedness or any syncopal episode. Patient did admit to drinking 2 beers, one at 2 PM and last one around 7 PM.  She denied any chest pain, shortness of breath, diaphoresis or syncopal episode. She walks at least one to 2 miles every day   Review of Systems:  Constitutional: Denies fever, chills, diaphoresis, poor appetite and fatigue.  HEENT: Denies photophobia, eye pain, redness, hearing loss, ear pain, congestion, sore throat, rhinorrhea, sneezing, mouth sores, trouble swallowing, neck pain, neck stiffness and tinnitus.   Respiratory: Denies SOB, DOE, cough, chest tightness,  and wheezing.   Cardiovascular: Denies chest pain, palpitations and leg swelling.  Gastrointestinal: Denies nausea, vomiting, abdominal pain, diarrhea, constipation, blood in stool and abdominal distention.  Genitourinary: Denies dysuria, urgency, frequency, hematuria, flank pain and difficulty urinating.  Musculoskeletal: See history of present illness  Skin: Denies pallor, rash and wound.  Neurological: Denies dizziness, seizures, syncope, weakness, light-headedness, numbness and headaches.  Hematological: Denies adenopathy. Easy bruising, personal or family bleeding history  Psychiatric/Behavioral: Denies suicidal ideation, mood changes, confusion, nervousness,  sleep disturbance and agitation  Past Medical History: Past Medical History  Diagnosis Date  . Hypertension   . Gout    Past Surgical History  Procedure Laterality Date  . Leg surgery    . Wrist surgery      left wrist    Medications: Prior to Admission medications   Not on File    Allergies:  No Known Allergies  Social History:  reports that she has never smoked. She does not have any smokeless tobacco history on file. She reports that she drinks about 1-2 beers every day.  She reports that she does not use illicit drugs. She lives at home alone and is functional with her ADLs.  Family History: No family history on file.  Physical Exam: Blood pressure 128/71, pulse 85, temperature 98.2 F (36.8 C), temperature source Oral, resp. rate 22, SpO2 98.00%. General: Alert, awake, oriented x3, in no acute distress. HEENT: normocephalic, atraumatic, anicteric sclera, pink conjunctiva, pupils equal and reactive to light and accomodation, oropharynx clear Neck: supple, no masses or lymphadenopathy, no goiter, no bruits  Heart: Regular rate and rhythm, without murmurs, rubs or gallops. Lungs: Clear to auscultation bilaterally, no wheezing, rales or rhonchi. Abdomen: Soft, nontender, nondistended, positive bowel sounds, no masses. Extremities: No clubbing, cyanosis or edema with positive pedal pulses, abrasion on left elbow, left hip tenderness, ROM decreased. Neuro: Grossly intact, no focal neurological deficits, strength 5/5 upper and lower extremities bilaterally Psych: alert and oriented x 3, normal mood and affect Skin: no rashes or lesions, warm and dry   LABS on Admission:  Basic Metabolic Panel:  Recent Labs Lab 12/16/12 0038  NA 135  K 4.1  CL 100  GLUCOSE 140*  BUN 4*  CREATININE 0.80     Recent Labs Lab 12/16/12 0029 12/16/12 0038  WBC 7.8  --  HGB 13.6 14.3  HCT 39.9 42.0  MCV 94.8  --   PLT 249  --      Radiological Exams on Admission: Dg  Chest 1 View  12/16/2012  *RADIOLOGY REPORT*  Clinical Data: Fall, left hip fracture.  CHEST - 1 VIEW  Comparison: 06/21/2007  Findings: Heart and mediastinal contours are within normal limits. No focal opacities or effusions.  No acute bony abnormality.  IMPRESSION: No active cardiopulmonary disease.   Original Report Authenticated By: Charlett Nose, M.D.    Dg Elbow Complete Left  12/16/2012  *RADIOLOGY REPORT*  Clinical Data: Fall, pain.  LEFT ELBOW - COMPLETE 3+ VIEW  Comparison: None.  Findings: No acute bony abnormality.  Specifically, no fracture, subluxation, or dislocation.  Soft tissues are intact.  No joint effusion.  IMPRESSION: No acute bony abnormality.   Original Report Authenticated By: Charlett Nose, M.D.    Dg Hip Complete Left  12/16/2012  *RADIOLOGY REPORT*  Clinical Data: Fall, left hip pain.  LEFT HIP - COMPLETE 2+ VIEW  Comparison: None.  Findings: There is a left femoral neck fracture with varus angulation.  Mild symmetric degenerative changes in the hips bilaterally.  No subluxation or dislocation.  IMPRESSION: Left femoral neck fracture with varus angulation.   Original Report Authenticated By: Charlett Nose, M.D.    Dg Foot Complete Right  12/09/2012  *RADIOLOGY REPORT*  Clinical Data: Fall last night with pain and soft tissue swelling in the lateral right foot.  RIGHT FOOT COMPLETE - 3+ VIEW  Comparison: 07/16/2012  Findings: Diffuse bone demineralization.  Transverse fracture of the distal right fifth metatarsal shaft with mild medial angulation of the distal fracture fragment.  Soft tissue swelling is present. Degenerative changes in the first metatarsophalangeal joint and in the interphalangeal joints.  Vascular calcifications.  Achilles calcaneal spur.  No destructive or expansile bone lesions.  No radiopaque soft tissue foreign bodies.  IMPRESSION: Acute transverse fracture of the distal right fifth metatarsal shaft.  Diffuse bone demineralization.   Original Report Authenticated  By: Burman Nieves, M.D.     Assessment/Plan Principal Problem:   Hip fracture, left  - Admit to MedSurg with hip fracture protocol, keep n.p.o., continue gentle hydration,  - EDP, Dr. Rennis Chris discussed with Dr Alveda Reasons, possible surgery tomorrow  - Cleared from medical standpoint for the surgery, no active cardiac issues, functionally active - Continue pain control, DVT prophylaxis  Active Problems:   Hypertension: Stable not on any medications     Gout: Stable    fracture of fifth right metatarsal bone - Continue postop shoe, PT, OT evaluation once stable from orthopedic standpoint    Alcohol abuse - Placed on CIWA protocol with Ativan, thiamine, folate, IVF  DVT prophylaxis: heparin SQ  CODE STATUS: Full code  Further plan will depend as patient's clinical course evolves and further radiologic and laboratory data become available. Dr. Marden Noble will assume care in a.m, left voice mail message.  Time Spent on Admission:  45 minutes  Bethanny Toelle M.D. [facility] 12/16/2012, 2:00 AM Pager: [phone]  If 7PM-7AM, please contact night-coverage [email or web address] Password [email or web address]

## 2012-12-16 NOTE — Progress Notes (Signed)
Clinical Social Work Department BRIEF PSYCHOSOCIAL ASSESSMENT 12/16/2012  Patient:  [name], [name]     Account Number:  [id number]     Admit date:  12/15/2012  Clinical Social Worker:  Candie Chroman  Date/Time:  12/16/2012 01:36 PM  Referred by:  RN  Date Referred:  12/16/2012 Referred for  SNF Placement   Other Referral:   Interview type:  Patient Other interview type:    PSYCHOSOCIAL DATA Living Status:  ALONE Admitted from facility:   Level of care:   Primary support name:  Carlie [name] Primary support relationship to patient:  CHILD, ADULT Degree of support available:   unclear    CURRENT CONCERNS Current Concerns  Post-Acute Placement   Other Concerns:    SOCIAL WORK ASSESSMENT / PLAN Pt is a 72 yr old female living at home prior to hospitalization. CSW met with pt to assist with d/c planning. Pt has hip surgery pending ( 5/6). PN indicate pt will be trans to [facility] for surgery. ST Rehab will be needed following hospital d/c. SNF search has been initiated and bed offers to be provided. Pt has been to [facility] in the past and will consider this as an option. CSW will continue to follow to assist with d/c planning to SNF.   Assessment/plan status:  Psychosocial Support/Ongoing Assessment of Needs Other assessment/ plan:   Information/referral to community resources:   SNF list provided.    PATIENT'S/FAMILY'S RESPONSE TO PLAN OF CARE: Pt feels ST Rehab will be needed after surgery. She is anxious to have surgery completed.   Cori Razor LCSW [phone]

## 2012-12-17 ENCOUNTER — Encounter (HOSPITAL_COMMUNITY): Payer: Self-pay | Admitting: Anesthesiology

## 2012-12-17 ENCOUNTER — Inpatient Hospital Stay (HOSPITAL_COMMUNITY): Admission: RE | Admit: 2012-12-17 | Payer: Medicare Other | Source: Ambulatory Visit | Admitting: Orthopedic Surgery

## 2012-12-17 ENCOUNTER — Inpatient Hospital Stay (HOSPITAL_COMMUNITY): Payer: Medicare Other

## 2012-12-17 ENCOUNTER — Encounter (HOSPITAL_COMMUNITY): Payer: Self-pay | Admitting: Certified Registered"

## 2012-12-17 ENCOUNTER — Encounter (HOSPITAL_COMMUNITY)
Admission: EM | Disposition: A | Discharge status: Skilled Nursing Facility | Payer: Self-pay | Source: Home / Self Care | Attending: Internal Medicine

## 2012-12-17 ENCOUNTER — Inpatient Hospital Stay (HOSPITAL_COMMUNITY): Payer: Medicare Other | Admitting: Anesthesiology

## 2012-12-17 DIAGNOSIS — E559 Vitamin D deficiency, unspecified: Secondary | ICD-10-CM | POA: Diagnosis not present

## 2012-12-17 DIAGNOSIS — S72033B Displaced midcervical fracture of unspecified femur, initial encounter for open fracture type I or II: Secondary | ICD-10-CM | POA: Diagnosis not present

## 2012-12-17 DIAGNOSIS — D62 Acute posthemorrhagic anemia: Secondary | ICD-10-CM | POA: Diagnosis not present

## 2012-12-17 DIAGNOSIS — I4891 Unspecified atrial fibrillation: Secondary | ICD-10-CM | POA: Diagnosis not present

## 2012-12-17 DIAGNOSIS — S72009A Fracture of unspecified part of neck of unspecified femur, initial encounter for closed fracture: Secondary | ICD-10-CM | POA: Diagnosis not present

## 2012-12-17 DIAGNOSIS — Z96649 Presence of unspecified artificial hip joint: Secondary | ICD-10-CM | POA: Diagnosis not present

## 2012-12-17 HISTORY — PX: HIP ARTHROPLASTY: SHX981

## 2012-12-17 LAB — VITAMIN D 25 HYDROXY (VIT D DEFICIENCY, FRACTURES): Vit D, 25-Hydroxy: 10 ng/mL — ABNORMAL LOW (ref 30–89)

## 2012-12-17 LAB — HEPATIC FUNCTION PANEL
Bilirubin, Direct: 0.3 mg/dL (ref 0.0–0.3)
Total Bilirubin: 1.1 mg/dL (ref 0.3–1.2)

## 2012-12-17 LAB — VITAMIN B12: Vitamin B-12: 323 pg/mL (ref 211–911)

## 2012-12-17 LAB — LIPID PANEL
Cholesterol: 170 mg/dL (ref 0–200)
Triglycerides: 461 mg/dL — ABNORMAL HIGH (ref <150)

## 2012-12-17 SURGERY — HEMIARTHROPLASTY, HIP, DIRECT ANTERIOR APPROACH, FOR FRACTURE
Anesthesia: General | Site: Hip | Laterality: Left | Wound class: Clean

## 2012-12-17 MED ORDER — ENOXAPARIN SODIUM 40 MG/0.4ML ~~LOC~~ SOLN
40.0000 mg | SUBCUTANEOUS | Status: DC
  Administered 2012-12-18 – 2012-12-19 (×2): 40 mg via SUBCUTANEOUS
  Filled 2012-12-17 (×3): qty 0.4

## 2012-12-17 MED ORDER — METOCLOPRAMIDE HCL 10 MG PO TABS: 5.0000 mg | ORAL_TABLET | Freq: Three times a day (TID) | ORAL | Status: DC | PRN

## 2012-12-17 MED ORDER — POLYETHYLENE GLYCOL 3350 17 G PO PACK
17.0000 g | PACK | Freq: Every day | ORAL | Status: DC
  Administered 2012-12-18 – 2012-12-19 (×2): 17 g via ORAL
  Filled 2012-12-17 (×2): qty 1

## 2012-12-17 MED ORDER — ONDANSETRON HCL 4 MG PO TABS
4.0000 mg | ORAL_TABLET | Freq: Four times a day (QID) | ORAL | Status: DC | PRN
  Administered 2012-12-18: 4 mg via ORAL
  Filled 2012-12-17: qty 1

## 2012-12-17 MED ORDER — ACETAMINOPHEN 325 MG PO TABS
650.0000 mg | ORAL_TABLET | Freq: Four times a day (QID) | ORAL | Status: DC | PRN
  Administered 2012-12-17 – 2012-12-19 (×2): 650 mg via ORAL
  Filled 2012-12-17 (×2): qty 2

## 2012-12-17 MED ORDER — LACTATED RINGERS IV SOLN
INTRAVENOUS | Status: DC | PRN
  Administered 2012-12-17: 08:00:00 via INTRAVENOUS

## 2012-12-17 MED ORDER — BISACODYL 10 MG RE SUPP: 10.0000 mg | Freq: Every day | RECTAL | Status: DC | PRN

## 2012-12-17 MED ORDER — SODIUM CHLORIDE 0.9 % IV SOLN
INTRAVENOUS | Status: DC
  Administered 2012-12-17: 75 mL/h via INTRAVENOUS

## 2012-12-17 MED ORDER — MIDAZOLAM HCL 5 MG/5ML IJ SOLN
INTRAMUSCULAR | Status: DC | PRN
  Administered 2012-12-17: 1 mg via INTRAVENOUS

## 2012-12-17 MED ORDER — CEFAZOLIN SODIUM 1-5 GM-% IV SOLN
2.0000 g | Freq: Once | INTRAVENOUS | Status: AC
  Administered 2012-12-17: 2 g via INTRAVENOUS
  Filled 2012-12-17: qty 100

## 2012-12-17 MED ORDER — LABETALOL HCL 5 MG/ML IV SOLN
INTRAVENOUS | Status: DC | PRN
  Administered 2012-12-17: 5 mg via INTRAVENOUS

## 2012-12-17 MED ORDER — SODIUM CHLORIDE 0.9 % IR SOLN
Status: DC | PRN
  Administered 2012-12-17: 1000 mL

## 2012-12-17 MED ORDER — METOCLOPRAMIDE HCL 5 MG/ML IJ SOLN: 5.0000 mg | Freq: Three times a day (TID) | INTRAMUSCULAR | Status: DC | PRN

## 2012-12-17 MED ORDER — MORPHINE SULFATE 2 MG/ML IJ SOLN: 0.5000 mg | INTRAMUSCULAR | Status: DC | PRN

## 2012-12-17 MED ORDER — MENTHOL 3 MG MT LOZG: 1.0000 | LOZENGE | OROMUCOSAL | Status: DC | PRN

## 2012-12-17 MED ORDER — FENTANYL CITRATE 0.05 MG/ML IJ SOLN
INTRAMUSCULAR | Status: DC | PRN
  Administered 2012-12-17: 50 ug via INTRAVENOUS
  Administered 2012-12-17: 100 ug via INTRAVENOUS

## 2012-12-17 MED ORDER — ROCURONIUM BROMIDE 100 MG/10ML IV SOLN
INTRAVENOUS | Status: DC | PRN
  Administered 2012-12-17: 50 mg via INTRAVENOUS

## 2012-12-17 MED ORDER — ONDANSETRON HCL 4 MG/2ML IJ SOLN: 4.0000 mg | Freq: Four times a day (QID) | INTRAMUSCULAR | Status: DC | PRN

## 2012-12-17 MED ORDER — CEFAZOLIN SODIUM-DEXTROSE 2-3 GM-% IV SOLR
2.0000 g | Freq: Four times a day (QID) | INTRAVENOUS | Status: AC
  Administered 2012-12-17 (×2): 2 g via INTRAVENOUS
  Filled 2012-12-17 (×2): qty 50

## 2012-12-17 MED ORDER — ONDANSETRON HCL 4 MG/2ML IJ SOLN
INTRAMUSCULAR | Status: DC | PRN
  Administered 2012-12-17: 4 mg via INTRAVENOUS

## 2012-12-17 MED ORDER — PHENOL 1.4 % MT LIQD: 1.0000 | OROMUCOSAL | Status: DC | PRN

## 2012-12-17 MED ORDER — LIDOCAINE HCL (CARDIAC) 20 MG/ML IV SOLN
INTRAVENOUS | Status: DC | PRN
  Administered 2012-12-17: 100 mg via INTRAVENOUS

## 2012-12-17 MED ORDER — DOCUSATE SODIUM 100 MG PO CAPS
100.0000 mg | ORAL_CAPSULE | Freq: Two times a day (BID) | ORAL | Status: DC
  Administered 2012-12-17 – 2012-12-19 (×5): 100 mg via ORAL
  Filled 2012-12-17 (×5): qty 1

## 2012-12-17 MED ORDER — PROPOFOL 10 MG/ML IV BOLUS
INTRAVENOUS | Status: DC | PRN
  Administered 2012-12-17: 130 mg via INTRAVENOUS

## 2012-12-17 MED ORDER — CEFAZOLIN SODIUM-DEXTROSE 2-3 GM-% IV SOLR
INTRAVENOUS | Status: AC
  Filled 2012-12-17: qty 50

## 2012-12-17 MED ORDER — ACETAMINOPHEN 650 MG RE SUPP: 650.0000 mg | Freq: Four times a day (QID) | RECTAL | Status: DC | PRN

## 2012-12-17 SURGICAL SUPPLY — 67 items
BLADE SAW SAG 73X25 THK (BLADE) ×1
BLADE SAW SGTL 73X25 THK (BLADE) ×1 IMPLANT
BRUSH FEMORAL CANAL (MISCELLANEOUS) IMPLANT
BRUSH SCRUB DISP (MISCELLANEOUS) ×4 IMPLANT
CLOTH BEACON ORANGE TIMEOUT ST (SAFETY) ×2 IMPLANT
COVER BACK TABLE 24X17X13 BIG (DRAPES) IMPLANT
COVER SURGICAL LIGHT HANDLE (MISCELLANEOUS) ×3 IMPLANT
DRAPE C-ARMOR (DRAPES) ×1 IMPLANT
DRAPE INCISE IOBAN 85X60 (DRAPES) ×3 IMPLANT
DRAPE ORTHO SPLIT 77X108 STRL (DRAPES) ×4
DRAPE SURG ORHT 6 SPLT 77X108 (DRAPES) ×2 IMPLANT
DRAPE U-SHAPE 47X51 STRL (DRAPES) ×2 IMPLANT
DRILL BIT 7/64X5 (BIT) ×2 IMPLANT
DRSG ADAPTIC 3X8 NADH LF (GAUZE/BANDAGES/DRESSINGS) ×1 IMPLANT
DRSG MEPILEX BORDER 4X8 (GAUZE/BANDAGES/DRESSINGS) ×1 IMPLANT
DRSG PAD ABDOMINAL 8X10 ST (GAUZE/BANDAGES/DRESSINGS) ×1 IMPLANT
ELECT BLADE 4.0 EZ CLEAN MEGAD (MISCELLANEOUS) ×2
ELECT BLADE 6.5 EXT (BLADE) IMPLANT
ELECT CAUTERY BLADE 6.4 (BLADE) ×3 IMPLANT
ELECT REM PT RETURN 9FT ADLT (ELECTROSURGICAL) ×2
ELECTRODE BLDE 4.0 EZ CLN MEGD (MISCELLANEOUS) IMPLANT
ELECTRODE REM PT RTRN 9FT ADLT (ELECTROSURGICAL) ×1 IMPLANT
EVACUATOR 1/8 PVC DRAIN (DRAIN) IMPLANT
GLOVE BIO SURGEON STRL SZ7.5 (GLOVE) ×2 IMPLANT
GLOVE BIO SURGEON STRL SZ8 (GLOVE) ×2 IMPLANT
GLOVE BIOGEL PI IND STRL 7.5 (GLOVE) ×1 IMPLANT
GLOVE BIOGEL PI IND STRL 8 (GLOVE) ×1 IMPLANT
GLOVE BIOGEL PI INDICATOR 7.5 (GLOVE) ×1
GLOVE BIOGEL PI INDICATOR 8 (GLOVE) ×1
GOWN PREVENTION PLUS XLARGE (GOWN DISPOSABLE) ×2 IMPLANT
GOWN PREVENTION PLUS XXLARGE (GOWN DISPOSABLE) IMPLANT
GOWN STRL NON-REIN LRG LVL3 (GOWN DISPOSABLE) ×3 IMPLANT
HANDPIECE INTERPULSE COAX TIP (DISPOSABLE)
IMMOBILIZER KNEE 20 (SOFTGOODS)
IMMOBILIZER KNEE 20 THIGH 36 (SOFTGOODS) IMPLANT
IMMOBILIZER KNEE 22 UNIV (SOFTGOODS) IMPLANT
IMMOBILIZER KNEE 24 THIGH 36 (MISCELLANEOUS) IMPLANT
IMMOBILIZER KNEE 24 UNIV (MISCELLANEOUS)
KIT BASIN OR (CUSTOM PROCEDURE TRAY) ×2 IMPLANT
KIT ROOM TURNOVER OR (KITS) ×2 IMPLANT
MANIFOLD NEPTUNE II (INSTRUMENTS) ×2 IMPLANT
NDL 1/2 CIR MAYO (NEEDLE) IMPLANT
NEEDLE 1/2 CIR MAYO (NEEDLE) ×2 IMPLANT
NS IRRIG 1000ML POUR BTL (IV SOLUTION) ×2 IMPLANT
PACK TOTAL JOINT (CUSTOM PROCEDURE TRAY) ×2 IMPLANT
PAD ARMBOARD 7.5X6 YLW CONV (MISCELLANEOUS) ×4 IMPLANT
PASSER SUT SWANSON 36MM LOOP (INSTRUMENTS) IMPLANT
PRESSURIZER FEMORAL UNIV (MISCELLANEOUS) IMPLANT
SET HNDPC FAN SPRY TIP SCT (DISPOSABLE) IMPLANT
SPONGE GAUZE 4X4 12PLY (GAUZE/BANDAGES/DRESSINGS) ×1 IMPLANT
SPONGE LAP 18X18 X RAY DECT (DISPOSABLE) ×1 IMPLANT
STAPLER VISISTAT 35W (STAPLE) ×2 IMPLANT
SUCTION FRAZIER TIP 10 FR DISP (SUCTIONS) ×1 IMPLANT
SUT ETHILON 3 0 PS 1 (SUTURE) ×2 IMPLANT
SUT FIBERWIRE #2 38 T-5 BLUE (SUTURE) ×4
SUT VIC AB 1 CT1 18XCR BRD 8 (SUTURE) IMPLANT
SUT VIC AB 1 CT1 27 (SUTURE) ×6
SUT VIC AB 1 CT1 27XBRD ANBCTR (SUTURE) ×2 IMPLANT
SUT VIC AB 1 CT1 8-18 (SUTURE) ×2
SUT VIC AB 2-0 CT1 27 (SUTURE) ×4
SUT VIC AB 2-0 CT1 TAPERPNT 27 (SUTURE) ×2 IMPLANT
SUTURE FIBERWR #2 38 T-5 BLUE (SUTURE) ×2 IMPLANT
TOWEL OR 17X24 6PK STRL BLUE (TOWEL DISPOSABLE) ×2 IMPLANT
TOWEL OR 17X26 10 PK STRL BLUE (TOWEL DISPOSABLE) ×4 IMPLANT
TOWER CARTRIDGE SMART MIX (DISPOSABLE) IMPLANT
TRAY FOLEY CATH 14FR (SET/KITS/TRAYS/PACK) IMPLANT
WATER STERILE IRR 1000ML POUR (IV SOLUTION) ×6 IMPLANT

## 2012-12-17 NOTE — Brief Op Note (Signed)
12/15/2012 - 12/17/2012  10:16 AM  PATIENT:  [name]  72 y.o. female  PRE-OPERATIVE DIAGNOSIS:  left hip fracture  POST-OPERATIVE DIAGNOSIS:  left hip fracture  PROCEDURE:  Procedure(s): LEFT HIP HEMI ARTHROPLASTY  (Left) using [employer] Duofix HA #5 stem, standard neck, 46mm unipolar head  SURGEON:  Surgeon(s) and Role:    * Budd Palmer, MD - Primary  PHYSICIAN ASSISTANT: [name], [facility]  ANESTHESIA:   general  EBL:  Total I/O In: -  Out: 200 [Urine:200]  BLOOD ADMINISTERED:none  DRAINS: none   LOCAL MEDICATIONS USED:  NONE  SPECIMEN:  No Specimen  DISPOSITION OF SPECIMEN:  N/A  COUNTS:  YES  TOURNIQUET:  * No tourniquets in log *  DICTATION: .Other Dictation: Dictation Number [id number]  PLAN OF CARE: Admit to inpatient   PATIENT DISPOSITION:  PACU - hemodynamically stable.   Delay start of Pharmacological VTE agent (>24hrs) due to surgical blood loss or risk of bleeding: no

## 2012-12-17 NOTE — Anesthesia Postprocedure Evaluation (Signed)
Anesthesia Post Note  Patient: [name]  Procedure(s) Performed: Procedure(s) (LRB): LEFT HIP HEMI ARTHROPLASTY  (Left)  Anesthesia type: General  Patient location: PACU  Post pain: Pain level controlled and Adequate analgesia  Post assessment: Post-op Vital signs reviewed, Patient's Cardiovascular Status Stable, Respiratory Function Stable, Patent Airway and Pain level controlled  Last Vitals:  Filed Vitals:   12/17/12 1130  BP:   Pulse: 78  Temp:   Resp: 12    Post vital signs: Reviewed and stable  Level of consciousness: awake, alert  and oriented  Complications: No apparent anesthesia complications

## 2012-12-17 NOTE — Progress Notes (Signed)
Orthopedic Tech Progress Note Patient Details:  [name] October 30, 1940 [id number]  Patient ID: [name], female   DOB: 1941-01-08, 72 y.o.   MRN: [id number] Trapeze bar patient helper  Nikki Dom 12/17/2012, 2:24 PM

## 2012-12-17 NOTE — Transfer of Care (Signed)
Immediate Anesthesia Transfer of Care Note  Patient: [name]  Procedure(s) Performed: Procedure(s): LEFT HIP HEMI ARTHROPLASTY  (Left)  Patient Location: PACU  Anesthesia Type:General  Level of Consciousness: awake, alert  and oriented  Airway & Oxygen Therapy: Patient Spontanous Breathing and Patient connected to nasal cannula oxygen  Post-op Assessment: Report given to PACU RN and Post -op Vital signs reviewed and stable  Post vital signs: Reviewed and stable  Complications: No apparent anesthesia complications

## 2012-12-17 NOTE — Anesthesia Preprocedure Evaluation (Addendum)
Anesthesia Evaluation  Patient identified by MRN, date of birth, ID band Patient awake    Reviewed: Allergy & Precautions, H&P , NPO status , Patient's Chart, lab work & pertinent test results  History of Anesthesia Complications Negative for: history of anesthetic complications  Airway Mallampati: I TM Distance: >3 FB Neck ROM: Full    Dental  (+) Edentulous Upper and Edentulous Lower   Pulmonary neg pulmonary ROS,          Cardiovascular hypertension, Pt. on medications + dysrhythmias     Neuro/Psych PSYCHIATRIC DISORDERS Depression CVA    GI/Hepatic Neg liver ROS, GERD-  Medicated,(+)     substance abuse  alcohol use,   Endo/Other  negative endocrine ROS  Renal/GU negative Renal ROS     Musculoskeletal   Abdominal   Peds  Hematology   Anesthesia Other Findings   Reproductive/Obstetrics                        Anesthesia Physical Anesthesia Plan  ASA: III  Anesthesia Plan: General   Post-op Pain Management:    Induction: Intravenous  Airway Management Planned: Oral ETT  Additional Equipment:   Intra-op Plan:   Post-operative Plan: Extubation in OR  Informed Consent: I have reviewed the patients History and Physical, chart, labs and discussed the procedure including the risks, benefits and alternatives for the proposed anesthesia with the patient or authorized representative who has indicated his/her understanding and acceptance.   Dental advisory given  Plan Discussed with: CRNA, Anesthesiologist and Surgeon  Anesthesia Plan Comments:        Anesthesia Quick Evaluation

## 2012-12-17 NOTE — Preoperative (Signed)
Beta Blockers   Reason not to administer Beta Blockers:Not Applicable 

## 2012-12-17 NOTE — Progress Notes (Signed)
Subjective: Stephania appears much calmer today.  I saw her in the hallway briefly in route to the OR for repair of her left hip fracture.  Yesterday she was quite tremulous and she was alert and conversive and able to speak to me without tremor her.  Pain has been relatively well controlled  Objective: Weight change:   Intake/Output Summary (Last 24 hours) at 12/17/12 0742 Last data filed at 12/17/12 1610  Gross per 24 hour  Intake    120 ml  Output   3600 ml  Net  -3480 ml   Filed Vitals:   12/17/12 0000 12/17/12 0005 12/17/12 0400 12/17/12 0609  BP:  154/88  164/89  Pulse:  86  100  Temp:    98.7 F (37.1 C)  TempSrc:    Oral  Resp: 16  16 16   Height:      Weight:      SpO2: 95%  94% 90%   Physical exam: Alert and oriented and lying supine on stretcher, enroute to the OR  Lab Results: Results for orders placed during the hospital encounter of 12/15/12 (from the past 48 hour(s))  CBC     Status: None   Collection Time    12/16/12 12:29 AM      Result Value Range   WBC 7.8  4.0 - 10.5 K/uL   RBC 4.21  3.87 - 5.11 MIL/uL   Hemoglobin 13.6  12.0 - 15.0 g/dL   HCT 96.0  45.4 - 09.8 %   MCV 94.8  78.0 - 100.0 fL   MCH 32.3  26.0 - 34.0 pg   MCHC 34.1  30.0 - 36.0 g/dL   RDW 11.9  14.7 - 82.9 %   Platelets 249  150 - 400 K/uL  TYPE AND SCREEN     Status: None   Collection Time    12/16/12 12:29 AM      Result Value Range   ABO/RH(D) O POS     Antibody Screen NEG     Sample Expiration 12/19/2012    ABO/RH     Status: None   Collection Time    12/16/12 12:29 AM      Result Value Range   ABO/RH(D) O POS    POCT I-STAT, CHEM 8     Status: Abnormal   Collection Time    12/16/12 12:38 AM      Result Value Range   Sodium 135  135 - 145 mEq/L   Potassium 4.1  3.5 - 5.1 mEq/L   Chloride 100  96 - 112 mEq/L   BUN 4 (*) 6 - 23 mg/dL   Creatinine, Ser 5.62  0.50 - 1.10 mg/dL   Glucose, Bld 130 (*) 70 - 99 mg/dL   Calcium, Ion 8.65 (*) 1.13 - 1.30 mmol/L   TCO2 24  0 -  100 mmol/L   Hemoglobin 14.3  12.0 - 15.0 g/dL   HCT 78.4  69.6 - 29.5 %  URINALYSIS, ROUTINE W REFLEX MICROSCOPIC     Status: None   Collection Time    12/16/12 12:40 AM      Result Value Range   Color, Urine YELLOW  YELLOW   APPearance CLEAR  CLEAR   Specific Gravity, Urine 1.010  1.005 - 1.030   pH 5.0  5.0 - 8.0   Glucose, UA NEGATIVE  NEGATIVE mg/dL   Hgb urine dipstick NEGATIVE  NEGATIVE   Bilirubin Urine NEGATIVE  NEGATIVE   Ketones, ur NEGATIVE  NEGATIVE mg/dL  Protein, ur NEGATIVE  NEGATIVE mg/dL   Urobilinogen, UA 0.2  0.0 - 1.0 mg/dL   Nitrite NEGATIVE  NEGATIVE   Leukocytes, UA NEGATIVE  NEGATIVE   Comment: MICROSCOPIC NOT DONE ON URINES WITH NEGATIVE PROTEIN, BLOOD, LEUKOCYTES, NITRITE, OR GLUCOSE <1000 mg/dL.  CBC     Status: Abnormal   Collection Time    12/16/12  5:21 AM      Result Value Range   WBC 10.7 (*) 4.0 - 10.5 K/uL   RBC 3.88  3.87 - 5.11 MIL/uL   Hemoglobin 12.2  12.0 - 15.0 g/dL   HCT 16.1  09.6 - 04.5 %   MCV 95.1  78.0 - 100.0 fL   MCH 31.4  26.0 - 34.0 pg   MCHC 33.1  30.0 - 36.0 g/dL   RDW 40.9  81.1 - 91.4 %   Platelets 216  150 - 400 K/uL  CREATININE, SERUM     Status: None   Collection Time    12/16/12  5:21 AM      Result Value Range   Creatinine, Ser 0.54  0.50 - 1.10 mg/dL   GFR calc non Af Amer >90  >90 mL/min   GFR calc Af Amer >90  >90 mL/min   Comment:            The eGFR has been calculated     using the CKD EPI equation.     This calculation has not been     validated in all clinical     situations.     eGFR's persistently     <90 mL/min signify     possible Chronic Kidney Disease.  VITAMIN D 25 HYDROXY     Status: Abnormal   Collection Time    12/16/12  5:21 AM      Result Value Range   Vit D, 25-Hydroxy 10 (*) 30 - 89 ng/mL   Comment: (NOTE)     This assay accurately quantifies Vitamin D, which is the sum of the     25-Hydroxy forms of Vitamin D2 and D3.  Studies have shown that the     optimum concentration of  25-Hydroxy Vitamin D is 30 ng/mL or higher.      Concentrations of Vitamin D between 20 and 29 ng/mL are considered to     be insufficient and concentrations less than 20 ng/mL are considered     to be deficient for Vitamin D.  SURGICAL PCR SCREEN     Status: None   Collection Time    12/16/12 11:28 PM      Result Value Range   MRSA, PCR NEGATIVE  NEGATIVE   Staphylococcus aureus NEGATIVE  NEGATIVE   Comment:            The Xpert SA Assay (FDA     approved for NASAL specimens     in patients over 10 years of age),     is one component of     a comprehensive surveillance     program.  Test performance has     been validated by [employer] for patients greater     than or equal to 103 year old.     It is not intended     to diagnose infection nor to     guide or monitor treatment.  GLUCOSE, CAPILLARY     Status: Abnormal   Collection Time    12/17/12  6:12 AM  Result Value Range   Glucose-Capillary 129 (*) 70 - 99 mg/dL    Studies/Results: Dg Chest 1 View  12/16/2012  *RADIOLOGY REPORT*  Clinical Data: Fall, left hip fracture.  CHEST - 1 VIEW  Comparison: 06/21/2007  Findings: Heart and mediastinal contours are within normal limits. No focal opacities or effusions.  No acute bony abnormality.  IMPRESSION: No active cardiopulmonary disease.   Original Report Authenticated By: Charlett Nose, M.D.    Dg Elbow Complete Left  12/16/2012  *RADIOLOGY REPORT*  Clinical Data: Fall, pain.  LEFT ELBOW - COMPLETE 3+ VIEW  Comparison: None.  Findings: No acute bony abnormality.  Specifically, no fracture, subluxation, or dislocation.  Soft tissues are intact.  No joint effusion.  IMPRESSION: No acute bony abnormality.   Original Report Authenticated By: Charlett Nose, M.D.    Dg Hip Complete Left  12/16/2012  *RADIOLOGY REPORT*  Clinical Data: Fall, left hip pain.  LEFT HIP - COMPLETE 2+ VIEW  Comparison: None.  Findings: There is a left femoral neck fracture with varus angulation.  Mild  symmetric degenerative changes in the hips bilaterally.  No subluxation or dislocation.  IMPRESSION: Left femoral neck fracture with varus angulation.   Original Report Authenticated By: Charlett Nose, M.D.    Dg Knee Left Port  12/16/2012  *RADIOLOGY REPORT*  Clinical Data: Left knee pain.  Tibial plateau fracture.  PORTABLE LEFT KNEE - 1-2 VIEW  Comparison: 04/19/2006.  Findings: Diffuse osteopenia is present.  The alignment the knee appears anatomic.  Mottling of the musculature of the thigh and leg suggests disuse or denervation atrophy.  No gross knee effusion. Postoperative changes of the tibial plateau with bone cement in the medial plateau and lateral buttress plate and screw fixation.  No complicating features.  Tibial plateau fracture appears healed.  IMPRESSION: Healed left tibial plateau fracture.  Osteopenia and diffuse atrophy of the left lower extremity.  No acute abnormality.   Original Report Authenticated By: Andreas Newport, M.D.    Medications: Scheduled Meds: . cholecalciferol  2,000 Units Oral Daily  . enoxaparin (LOVENOX) injection  40 mg Subcutaneous Q24H  . folic acid  1 mg Oral Daily  . multivitamin with minerals  1 tablet Oral Daily  . pantoprazole  40 mg Oral Daily  . thiamine  100 mg Oral Daily   Or  . thiamine  100 mg Intravenous Daily  . vitamin B-12  1,000 mcg Oral Daily  . Vitamin D (Ergocalciferol)  50,000 Units Oral Q7 days   Continuous Infusions:  PRN Meds:.acetaminophen, HYDROcodone-acetaminophen, LORazepam, LORazepam, morphine injection  Assessment/Plan:  Principal Problem:  Hip fracture, left  Orthopedicsurgery planned for this morning for left hip repair.  Cleared from medical standpoint for the surgery, no active cardiac issues, functionally active  Continue pain control, DVT prophylaxis  Active Problems:  History of esophageal stricture and GERD - PPI therapy b.i.d.  History of B12 deficiency - B12 level pending  History vitamin D deficiency -  vitamin D level quite low at 10.  Being replaced  Hypertension: Blood pressure starting to creep up.  We'll start ARB and/or calcium channel blocker as needed Gout: Stable, asymptomatic  Fracture of fifth right metatarsal bone - Continue postop flat shoe  PT/OT evaluation once stable from orthopedic standpoint  Alcohol abuse  Continue CIWA protocol with Ativan, thiamine, folate, IVF  DVT prophylaxis: heparin SQ  CODE STATUS: Full code   LOS: 2 days   Pearla Dubonnet, MD 12/17/2012, 7:42 AM

## 2012-12-17 NOTE — Anesthesia Procedure Notes (Signed)
Procedure Name: Intubation Date/Time: 12/17/2012 8:23 AM Performed by: Charm Barges, Faren Florence R Pre-anesthesia Checklist: Patient identified, Emergency Drugs available, Suction available, Patient being monitored and Timeout performed Patient Re-evaluated:Patient Re-evaluated prior to inductionOxygen Delivery Method: Circle system utilized Preoxygenation: Pre-oxygenation with 100% oxygen Intubation Type: IV induction Ventilation: Mask ventilation without difficulty Laryngoscope Size: Mac and 3 Grade View: Grade I Tube type: Oral Tube size: 7.5 mm Number of attempts: 1 Airway Equipment and Method: Stylet Placement Confirmation: ETT inserted through vocal cords under direct vision,  positive ETCO2 and breath sounds checked- equal and bilateral Secured at: 21 cm Tube secured with: Tape Dental Injury: Teeth and Oropharynx as per pre-operative assessment

## 2012-12-17 NOTE — Evaluation (Signed)
Clinical/Bedside Swallow Evaluation Patient Details  Name: [name] MRN: [id number] Date of Birth: August 20, 1940  Today's Date: 12/17/2012 Time: 1308-6578 SLP Time Calculation (min): 20 min  Past Medical History:  Past Medical History  Diagnosis Date  . Hypertension   . Gout    Past Surgical History:  Past Surgical History  Procedure Laterality Date  . Leg surgery    . 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 S/p fall. Diagnosed with Acute transverse fracture of the distal right fifth metatarsal shaft. Diffuse bone demineralization. S/p repair 5/6 am   Assessment / Plan / Recommendation Clinical Impression  Patient presents with a functional appearing oropharyngeal swallow based on bedside evaluation. No overt indication of aspiration noted although po trials limited due to c/o nausea. Recommend initiation of a regular diet with general safe swallowing precautions. No further f/u SLP warranted at this time.     Aspiration Risk  Mild    Diet Recommendation Regular;Thin liquid   Liquid Administration via: Cup;Straw Medication Administration: Whole meds with liquid Supervision: Patient able to self feed Compensations: Slow rate;Small sips/bites Postural Changes and/or Swallow Maneuvers: Seated upright 90 degrees;Upright 30-60 min after meal    Other  Recommendations Oral Care Recommendations: Oral care BID   Follow Up Recommendations  None       Pertinent Vitals/Pain None reported        Swallow Study    General HPI: 72 year old female with history of esophageal stricture per barium swallow 07/20/08, hypertension, gout who is otherwise functionally active, 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 S/p fall. Diagnosed with Acute transverse fracture of the distal  right fifth metatarsal shaft. Diffuse bone demineralization. S/p repair 5/6 am Type of Study: Bedside swallow evaluation Previous Swallow Assessment: none Diet Prior to this Study: NPO Temperature Spikes Noted: No Respiratory Status: Room air History of Recent Intubation: No Behavior/Cognition: Alert;Cooperative;Pleasant mood (mildly lethargic from surgery) Oral Cavity - Dentition: Edentulous Self-Feeding Abilities: Able to feed self Patient Positioning: Upright in bed Baseline Vocal Quality: Clear Volitional Cough: Strong Volitional Swallow: Able to elicit    Oral/Motor/Sensory Function Overall Oral Motor/Sensory Function: Appears within functional limits for tasks assessed   Ice Chips Ice chips: Not tested   Thin Liquid Thin Liquid: Within functional limits Presentation: Cup;Straw;Self Fed    Nectar Thick Nectar Thick Liquid: Not tested   Honey Thick Honey Thick Liquid: Not tested   Puree Puree: Within functional limits Presentation: Spoon   Solid   GO   [name], CCC-SLP [phone]  Solid: Within functional limits Presentation: Self Fed       Cherokee Boccio Meryl 12/17/2012,4:09 PM

## 2012-12-18 ENCOUNTER — Encounter (HOSPITAL_COMMUNITY): Payer: Self-pay | Admitting: Orthopedic Surgery

## 2012-12-18 LAB — CBC
HCT: 31.6 % — ABNORMAL LOW (ref 36.0–46.0)
Hemoglobin: 10.7 g/dL — ABNORMAL LOW (ref 12.0–15.0)
MCHC: 33.9 g/dL (ref 30.0–36.0)
RBC: 3.34 MIL/uL — ABNORMAL LOW (ref 3.87–5.11)

## 2012-12-18 LAB — BASIC METABOLIC PANEL
BUN: 7 mg/dL (ref 6–23)
CO2: 26 mEq/L (ref 19–32)
Chloride: 100 mEq/L (ref 96–112)
GFR calc non Af Amer: 81 mL/min — ABNORMAL LOW (ref >90)
Glucose, Bld: 134 mg/dL — ABNORMAL HIGH (ref 70–99)
Potassium: 4.3 mEq/L (ref 3.5–5.1)

## 2012-12-18 MED ORDER — ZOLPIDEM TARTRATE 5 MG PO TABS
5.0000 mg | ORAL_TABLET | Freq: Once | ORAL | Status: DC
  Filled 2012-12-18: qty 1

## 2012-12-18 NOTE — Progress Notes (Signed)
[facility] Progress Note     1 Day Post-Op  Subjective   Doing well No complaints L hip feeling better   Objective  BP 123/60  Pulse 75  Temp(Src) 98.1 F (36.7 C) (Oral)  Resp 16  Ht 5\' 10"  (1.778 m)  Wt 65 kg (143 lb 4.8 oz)  BMI 20.56 kg/m2  SpO2 96%  Intake/Output     05/06 0701 - 05/07 0700 05/07 0701 - 05/08 0700   P.O. 380    I.V. (mL/kg) 2225 (34.2)    Other 450    Total Intake(mL/kg) 3055 (47)    Urine (mL/kg/hr) 1550 (1)    Blood 300 (0.2)    Total Output 1850     Net +1205            Labs Results for [name], [name] (MRN [id number]) as of 12/18/2012 08:44  Ref. Range 12/18/2012 05:52  Sodium Latest Range: 135-145 mEq/L 134 (L)  Potassium Latest Range: 3.5-5.1 mEq/L 4.3  Chloride Latest Range: 96-112 mEq/L 100  CO2 Latest Range: 19-32 mEq/L 26  BUN Latest Range: 6-23 mg/dL 7  Creatinine Latest Range: 0.50-1.10 mg/dL 4.69  Calcium Latest Range: 8.4-10.5 mg/dL 7.9 (L)  GFR calc non Af Amer Latest Range: >90 mL/min 81 (L)  GFR calc Af Amer Latest Range: >90 mL/min >90  Glucose Latest Range: 70-99 mg/dL 629 (H)  WBC Latest Range: 4.0-10.5 K/uL 12.4 (H)  RBC Latest Range: 3.87-5.11 MIL/uL 3.34 (L)  Hemoglobin Latest Range: 12.0-15.0 g/dL 52.8 (L)  HCT Latest Range: 36.0-46.0 % 31.6 (L)  MCV Latest Range: 78.0-100.0 fL 94.6  MCH Latest Range: 26.0-34.0 pg 32.0  MCHC Latest Range: 30.0-36.0 g/dL 41.3  RDW Latest Range: 11.5-15.5 % 13.0  Platelets Latest Range: 150-400 K/uL 191    Exam  Gen:  Awake and alert, NAD Lungs: clear B  Cardiac: s1 and s2 Abd: + BS, NT Ext:            Left Lower Extremity  Dressing c/d/i  Minimal swelling  Motor and sensory functions intact  + DP pulse  Compartments soft, NT  No DCT     Assessment and Plan  1 Day Post-Op  72 y/o female s/p fall  1. Left femoral neck fracture s/p L hip hemiarthroplasty POD 1  PWB L hip (50 %)  Posterior hip precautions   PT/OT  Dressing change tomorrow  TED  hose  Abduction pillow when in Bed  2. EtOH dependence  No signs of withdrawal  3. Medical issues  Per Dr. Kevan Ny  4. Vitamin D deficiency/ Osteoporosis  Pt clinically with osteoporosis  Discuss outpt tx with additional pharmacologic agents (prolia or forteo)  Will need outpt DEXA    Started on Vitamin D replacement (D2 and D3)   5. Pain management:  Continue with low dose narcs  Po tylenol  6. DVT/PE prophylaxis:  Lovenox x 3 weeks 7. ID:   Completed post op course  8. Activity:  OOB with assist  See #1  9. FEN/Foley/Lines:  Dc foley   10. Impediments to fracture healing:  Immobility  Vitamin d deficiency  EtOH use  Falls     11. Dispo:  PT/OT evals  pts son indicated that he would like to take her home after acute stay    Melanie Latin, PA-C [facility] [phone] (P) 12/18/2012 8:44 AM

## 2012-12-18 NOTE — Progress Notes (Signed)
Orthopedic Tech Progress Note Patient Details:  [name] 08/12/1941 [id number]  Ortho Devices Type of Ortho Device: Postop shoe/boot Ortho Device/Splint Interventions: Application   Cammer, Mickie Bail 12/18/2012, 1:16 PM

## 2012-12-18 NOTE — Op Note (Signed)
[name], [name] NO.:  [id number]  MEDICAL RECORD NO.:  [id number]  LOCATION:  PERIO                        FACILITY:  [facility]  PHYSICIAN:  Doralee Albino. Carola Frost, M.D. DATE OF BIRTH:  [birth year]  DATE OF PROCEDURE:  12/17/2012 DATE OF DISCHARGE:                              OPERATIVE REPORT   PREOPERATIVE DIAGNOSIS:  Displaced left femoral neck fracture.  POSTOPERATIVE DIAGNOSIS:  Displaced left femoral neck fracture.  PROCEDURE:  Left hip hemiarthroplasty using a [employer] Summit DuoFix, HA #5 stem standard neck and 46-mm unipolar head  Press-Fit.  SURGEON:  Doralee Albino. Carola Frost, M.D.  ASSISTANT:  Mearl Latin, [place]  ANESTHESIA:  General.  COMPLICATIONS:  None.  ESTIMATED BLOOD LOSS:  Approximately 180 mL of intramedullary bleeding.  DISPOSITION:  To PACU.  CONDITION:  Stable.  BRIEF SUMMARY AND INDICATION FOR PROCEDURE:  [name] is a 72 year old female with multiple prior fractures, but no significant cardiopulmonary disease, who sustained a displaced left femoral neck fracture in a ground level fall after a recent foot fracture.  I discussed with her preoperatively, the risks and benefits of surgery including the possibility of limb length inequality, nerve injury, vessel injury, further acetabular wear, which could require conversion to total hip arthroplasty, blood loss requiring transfusion, DVT, PE, hip instability, heart attack, stroke, and many others.  The patient understood these risks and others and did wish to proceed.  BRIEF DESCRIPTION OF PROCEDURE:  [name] was given preoperative antibiotics consisting of Ancef.  She was taken to the operating room where general anesthesia was induced.  She was positioned left side up with all prominences padded appropriately.  Left lower extremity was then prepped and draped in usual sterile fashion.  Standard posterior approach was then made to the hip through a 7-cm incision centered over the  proximal aspect of the greater trochanter.  Dissection was carried down to the tensor split in line with the incision.  The hip was brought into abduction and external rotation reviewing the internal rotators. These were divided near their insertion and grasped with #2 FiberWire. The capsule was then teed and secured with a #1 Vicryl.  A femoral neck cut was then refined with saw and the head extracted and sized to a 46 mm.  She did pass through the 47 mm as well.  I did trial both of these twice and felt that the 46 allowed for complete seating where as the 47 just barely did not and consequently we did go with the 46 mm head.  The femur was then prepared with a use of the box cutter to lateralize a sequential reaming and then sequential broaching up to a 5 obtaining an excellent fit and fill controlling for appropriate anteversion.  The hip was then trialed and gave excellent range of motion and stability with a standard neck.  The acetabulum was irrigated thoroughly once more to make sure that all bone fragments were free and clear, and then the actual components were placed.  The hip was taken through range of motion again finding excellent extension, restoration of length based on preoperative assessment and excellent stability in flexion, abduction, and internal rotation.  Again head size did appear to be appropriate. The capsule was repaired with #1 figure-of-eight Vicryl, then the short rotators were repaired directly back to bone with #2 FiberWire, and the tensor with #1 figure-of-eight interrupted 2-0 Vicryl, and staples for the skin.  Sterile gently compressive dressing was applied and abduction pillow.  The patient was awakened from anesthesia and transported to PACU in stable condition.  [name], PA-C did assist me throughout the procedure and was absolutely necessary for the safe and effective completion as he was required for exposure, preparation of the femur,  atraumatic relocation, and trialing as well as atraumatic dislocation of the trial components, and relocation of the actual implants.  We did assist with wound closure as well.  PROGNOSIS:  [name] will be weightbearing as tolerated with posterior hip precautions.  I am hopeful that she will go on recover muscle, ambulatory function, however, loss of 1 grade of ambulation would not be improbable.  She would be amenable to conversion to a total hip arthroplasty with retention of the femoral component, if she is able to regain sufficient activity and if not, would benefit from the unipolar head and additional stability particularly given a history of alcohol use.  We will continue to make sure that her bone health issues are addressed and optimized to the best of our ability.     Doralee Albino. Carola Frost, M.D.     MHH/MEDQ  D:  12/17/2012  T:  12/18/2012  Job:  [id number]

## 2012-12-18 NOTE — Progress Notes (Addendum)
Patient reported that she does not want to go to [facility] and would like to go to [facility]  when she is medically stable for d/c. CSW will continue to follow  Sabino Niemann, MSW [phone]

## 2012-12-18 NOTE — Care Management Note (Signed)
CARE MANAGEMENT NOTE 12/18/2012  Patient:  [name], [name]   Account Number:  [id number]  Date Initiated:  12/18/2012  Documentation initiated by:  Vance Peper  Subjective/Objective Assessment:   72 yr old female s/p left total hip arthroplasty.     Action/Plan:   Patient is for shortterm rehab at [facility].   Anticipated DC Date:  12/19/2012   Anticipated DC Plan:  SKILLED NURSING FACILITY  In-house referral  Clinical Social Worker      DC Planning Services  CM consult      Choice offered to / List presented to:             Status of service:  Completed, signed off Medicare Important Message given?   (If response is "NO", the following Medicare IM given date fields will be blank) Date Medicare IM given:   Date Additional Medicare IM given:    Discharge Disposition:  SKILLED NURSING FACILITY  Per UR Regulation:    If discussed at Long Length of Stay Meetings, dates discussed:    Comments:

## 2012-12-18 NOTE — Progress Notes (Signed)
Subjective: [name] is doing well postoperatively.  No signs of alcohol withdrawal on lorazepam protocol.  Left hip site is much improved in terms of pain after surgery.  No shortness of breath or agitation  Objective: Weight change:   Intake/Output Summary (Last 24 hours) at 12/18/12 0721 Last data filed at 12/18/12 0600  Gross per 24 hour  Intake   3055 ml  Output   1850 ml  Net   1205 ml   Filed Vitals:   12/18/12 0019 12/18/12 0245 12/18/12 0339 12/18/12 0514  BP:  134/70  123/60  Pulse:  70  75  Temp: 99.9 F (37.7 C) 99.1 F (37.3 C)  98.1 F (36.7 C)  TempSrc: Oral Oral  Oral  Resp:  16 16 16   Height:      Weight:      SpO2:  97% 97% 96%   General: Alert awake and oriented x2-3. Certainly to name and place.  HEENT: Sclera are muddy and oropharynx is moist  Neck: Supple without masses or obvious lymphadenopathy  Heart: Regular rhythm without murmurs or gallops  Lungs: Clear to auscultation bilaterally  Extremities: No peripheral edema. Abrasion on left elbow healing.  Left hip wound dressing is dry. Tender in the right mid foot to palpation  Neurological: Alert but mildly tremulous. No focal deficits  Psych: Alert and calm Skin: Warm and dry   Lab Results: Results for orders placed during the hospital encounter of 12/15/12 (from the past 48 hour(s))  SURGICAL PCR SCREEN     Status: None   Collection Time    12/16/12 11:28 PM      Result Value Range   MRSA, PCR NEGATIVE  NEGATIVE   Staphylococcus aureus NEGATIVE  NEGATIVE   Comment:            The Xpert SA Assay (FDA     approved for NASAL specimens     in patients over 50 years of age),     is one component of     a comprehensive surveillance     program.  Test performance has     been validated by [employer] for patients greater     than or equal to 75 year old.     It is not intended     to diagnose infection nor to     guide or monitor treatment.  GLUCOSE, CAPILLARY     Status: Abnormal    Collection Time    12/17/12  6:12 AM      Result Value Range   Glucose-Capillary 129 (*) 70 - 99 mg/dL  VITAMIN W09     Status: None   Collection Time    12/17/12  6:40 AM      Result Value Range   Vitamin B-12 323  211 - 911 pg/mL  VITAMIN D 25 HYDROXY     Status: Abnormal   Collection Time    12/17/12  6:40 AM      Result Value Range   Vit D, 25-Hydroxy 11 (*) 30 - 89 ng/mL   Comment: (NOTE)     This assay accurately quantifies Vitamin D, which is the sum of the     25-Hydroxy forms of Vitamin D2 and D3.  Studies have shown that the     optimum concentration of 25-Hydroxy Vitamin D is 30 ng/mL or higher.      Concentrations of Vitamin D between 20 and 29 ng/mL are considered to  be insufficient and concentrations less than 20 ng/mL are considered     to be deficient for Vitamin D.  HEPATIC FUNCTION PANEL     Status: Abnormal   Collection Time    12/17/12  6:40 AM      Result Value Range   Total Protein 6.6  6.0 - 8.3 g/dL   Albumin 3.2 (*) 3.5 - 5.2 g/dL   AST 26  0 - 37 U/L   ALT 23  0 - 35 U/L   Alkaline Phosphatase 98  39 - 117 U/L   Total Bilirubin 1.1  0.3 - 1.2 mg/dL   Bilirubin, Direct 0.3  0.0 - 0.3 mg/dL   Indirect Bilirubin 0.8  0.3 - 0.9 mg/dL  LIPID PANEL     Status: Abnormal   Collection Time    12/17/12  6:40 AM      Result Value Range   Cholesterol 170  0 - 200 mg/dL   Triglycerides 161 (*) <150 mg/dL   HDL 37 (*) >09 mg/dL   Total CHOL/HDL Ratio 4.6     VLDL UNABLE TO CALCULATE IF TRIGLYCERIDE OVER 400 mg/dL  0 - 40 mg/dL   LDL Cholesterol UNABLE TO CALCULATE IF TRIGLYCERIDE OVER 400 mg/dL  0 - 99 mg/dL   Comment:            Total Cholesterol/HDL:CHD Risk     Coronary Heart Disease Risk Table                         Men   Women      1/2 Average Risk   3.4   3.3      Average Risk       5.0   4.4      2 X Average Risk   9.6   7.1      3 X Average Risk  23.4   11.0                Use the calculated Patient Ratio     above and the CHD Risk Table      to determine the patient's CHD Risk.                ATP III CLASSIFICATION (LDL):      <100     mg/dL   Optimal      604-540  mg/dL   Near or Above                        Optimal      130-159  mg/dL   Borderline      981-191  mg/dL   High      >478     mg/dL   Very High  CBC     Status: Abnormal   Collection Time    12/18/12  5:52 AM      Result Value Range   WBC 12.4 (*) 4.0 - 10.5 K/uL   RBC 3.34 (*) 3.87 - 5.11 MIL/uL   Hemoglobin 10.7 (*) 12.0 - 15.0 g/dL   HCT 29.5 (*) 62.1 - 30.8 %   MCV 94.6  78.0 - 100.0 fL   MCH 32.0  26.0 - 34.0 pg   MCHC 33.9  30.0 - 36.0 g/dL   RDW 65.7  84.6 - 96.2 %   Platelets 191  150 - 400 K/uL  BASIC METABOLIC PANEL     Status: Abnormal  Collection Time    12/18/12  5:52 AM      Result Value Range   Sodium 134 (*) 135 - 145 mEq/L   Potassium 4.3  3.5 - 5.1 mEq/L   Chloride 100  96 - 112 mEq/L   CO2 26  19 - 32 mEq/L   Glucose, Bld 134 (*) 70 - 99 mg/dL   BUN 7  6 - 23 mg/dL   Creatinine, Ser 1.61  0.50 - 1.10 mg/dL   Calcium 7.9 (*) 8.4 - 10.5 mg/dL   GFR calc non Af Amer 81 (*) >90 mL/min   GFR calc Af Amer >90  >90 mL/min   Comment:            The eGFR has been calculated     using the CKD EPI equation.     This calculation has not been     validated in all clinical     situations.     eGFR's persistently     <90 mL/min signify     possible Chronic Kidney Disease.    Studies/Results: Dg Pelvis Portable  12/17/2012  *RADIOLOGY REPORT*  Clinical Data: Postop left total hip arthroplasty.  PORTABLE PELVIS  Comparison: CT urogram 08/17/2004.  Findings: 1110 hours.  The patient is status post left hip bipolar hemiarthroplasty.  The distal end of the femoral prosthesis is not imaged.  There is no evidence of acute fracture or dislocation. Mild soft tissue emphysema is noted lateral to the proximal left femur.  IMPRESSION: No demonstrated complication following hip hemiarthroplasty.   Original Report Authenticated By: Carey Bullocks,  M.D.    Dg Hip Portable 1 View Left  12/17/2012  *RADIOLOGY REPORT*  Clinical Data: Postop left total hip arthroplasty.  PORTABLE LEFT HIP - 1 VIEW  Comparison: Hip radiographs 12/15/2012.  Findings: Cross-table lateral view 1113 hours.  Patient is status post left hip hemiarthroplasty.  The hardware appears well positioned.  There is no evidence of acute fracture or dislocation.  IMPRESSION: No demonstrated complication following left hip hemiarthroplasty.   Original Report Authenticated By: Carey Bullocks, M.D.    Dg Knee Left Port  12/16/2012  *RADIOLOGY REPORT*  Clinical Data: Left knee pain.  Tibial plateau fracture.  PORTABLE LEFT KNEE - 1-2 VIEW  Comparison: 04/19/2006.  Findings: Diffuse osteopenia is present.  The alignment the knee appears anatomic.  Mottling of the musculature of the thigh and leg suggests disuse or denervation atrophy.  No gross knee effusion. Postoperative changes of the tibial plateau with bone cement in the medial plateau and lateral buttress plate and screw fixation.  No complicating features.  Tibial plateau fracture appears healed.  IMPRESSION: Healed left tibial plateau fracture.  Osteopenia and diffuse atrophy of the left lower extremity.  No acute abnormality.   Original Report Authenticated By: Andreas Newport, M.D.    Medications: Scheduled Meds: . cholecalciferol  2,000 Units Oral Daily  . docusate sodium  100 mg Oral BID  . enoxaparin (LOVENOX) injection  40 mg Subcutaneous Q24H  . folic acid  1 mg Oral Daily  . multivitamin with minerals  1 tablet Oral Daily  . pantoprazole  40 mg Oral Daily  . polyethylene glycol  17 g Oral Daily  . thiamine  100 mg Oral Daily   Or  . thiamine  100 mg Intravenous Daily  . vitamin B-12  1,000 mcg Oral Daily  . Vitamin D (Ergocalciferol)  50,000 Units Oral Q7 days   Continuous Infusions: . sodium chloride 75 mL/hr  at 12/18/12 0600   PRN Meds:.acetaminophen, acetaminophen, bisacodyl, HYDROcodone-acetaminophen,  LORazepam, LORazepam, menthol-cetylpyridinium, metoCLOPramide (REGLAN) injection, metoCLOPramide, morphine injection, ondansetron (ZOFRAN) IV, ondansetron, phenol  Assessment/Plan:  Principal Problems:  Hip fracture, left - status post left hip hemiarthroplasty secondary to fracture - so far doing well Fracture of fifth right metatarsal bone - Continue postop shoe - will make rehabilitation more difficult Continue pain control, DVT prophylaxis  Active Problems:  History of esophageal stricture and GERD - PPI therapy b.i.d.  History of B12 deficiency - normal History vitamin D deficiency - vitamin D being replaced Hypertension: Stable - not on any medications - but will follow carefully  Gout: Stable    PT/OT and skilled nursing facility. Alcohol abuse - doing well on Ativan protocol DVT prophylaxis: heparin SQ  CODE STATUS: Full code  Disposition:  We'll see if we can have rehabilitation at [facility] skilled nursing facility where I can follow her closely   LOS: 3 days   Melanie Dubonnet, MD 12/18/2012, 7:21 AM

## 2012-12-18 NOTE — Progress Notes (Signed)
Physical Therapy Evaluation Patient Details Name: [name] MRN: [id number] DOB: April 20, 1941 Today's Date: 12/18/2012 Time: 1130-1200 PT Time Calculation (min): 30 min  PT Assessment / Plan / Recommendation Clinical Impression   72 yo female admitted with fall resulting in intertrochanteric fx; now s/p Hip hemiarthroplasty; Presents with decreased independence and safety with mobility; Will benefit from PT to maximizie independence and safety with mobility and facilitate dc to SNF for rehab      PT Assessment  Patient needs continued PT services    Follow Up Recommendations  SNF    Does the patient have the potential to tolerate intense rehabilitation      Barriers to Discharge        Equipment Recommendations  Rolling walker with 5" wheels    Recommendations for Other Services OT consult   Frequency Min 5X/week    Precautions / Restrictions Precautions Precautions: Anterior Hip;Fall Precaution Booklet Issued: Yes (comment) Restrictions LLE Weight Bearing: Partial weight bearing LLE Partial Weight Bearing Percentage or Pounds: 50  Post-op shoe for Rfoot secondary to 5th metatarsal fx (shoe wasn't in room for PT eval, PA notified that we mobilized without shoe, and that we need one ordered)  Pertinent Vitals/Pain Grimace with movement; 7/10 pain      Mobility  Bed Mobility Bed Mobility: Supine to Sit;Sitting - Scoot to Edge of Bed Supine to Sit: 3: Mod assist;With rails Sitting - Scoot to Edge of Bed: 4: Min assist;With rail Details for Bed Mobility Assistance: Cues for hip precautions and technique; physical assist to elevate trunk off of bed Transfers Transfers: Sit to Stand;Stand to Sit Sit to Stand: 4: Min assist;From bed;With upper extremity assist Stand to Sit: 4: Min assist;To chair/3-in-1;With upper extremity assist Details for Transfer Assistance: Cues for technique and ositioning for post hip prec Ambulation/Gait Ambulation/Gait Assistance: 4: Min  assist Ambulation Distance (Feet): 12 Feet Assistive device: Rolling walker Ambulation/Gait Assistance Details: Cues for gait sequence and 50%PWB Gait Pattern: Step-to pattern    Exercises     PT Diagnosis: Difficulty walking;Acute pain  PT Problem List: Decreased strength;Decreased range of motion;Decreased activity tolerance;Decreased mobility;Decreased knowledge of use of DME;Decreased knowledge of precautions;Pain PT Treatment Interventions: DME instruction;Gait training;Stair training;Functional mobility training;Therapeutic activities;Therapeutic exercise;Patient/family education   PT Goals Acute Rehab PT Goals PT Goal Formulation: With patient Time For Goal Achievement: 12/18/12 Potential to Achieve Goals: Good Pt will go Supine/Side to Sit: with supervision PT Goal: Supine/Side to Sit - Progress: Goal set today Pt will go Sit to Supine/Side: with supervision PT Goal: Sit to Supine/Side - Progress: Goal set today Pt will go Sit to Stand: with supervision PT Goal: Sit to Stand - Progress: Goal set today Pt will go Stand to Sit: with supervision PT Goal: Stand to Sit - Progress: Goal set today Pt will Ambulate: >150 feet;with supervision;with rolling walker PT Goal: Ambulate - Progress: Goal set today Pt will Perform Home Exercise Program: Independently PT Goal: Perform Home Exercise Program - Progress: Goal set today  Visit Information  Last PT Received On: 12/18/12 Assistance Needed: +1    Subjective Data  Subjective: Agreeable to OOB; reported she had not broken her foot when asked if she had her postop shoe for her R ight 5th Metatarsal fx Patient Stated Goal: wants to go to [employer]   Prior Functioning  Home Living Lives With: Alone Available Help at Discharge: Family;Friend(s) Type of Home: Apartment Home Access: Stairs to enter [employer] of Steps: 2 Additional Comments: Planning to  go to SNF Prior Function Level of Independence:  Independent Able to Take Stairs?: Yes Driving: Yes Communication Communication: No difficulties    Cognition  Cognition Arousal/Alertness: Awake/alert Behavior During Therapy: WFL for tasks assessed/performed Overall Cognitive Status: Within Functional Limits for tasks assessed    Extremity/Trunk Assessment Right Upper Extremity Assessment RUE ROM/Strength/Tone: Within functional levels Left Upper Extremity Assessment LUE ROM/Strength/Tone: Within functional levels Right Lower Extremity Assessment RLE ROM/Strength/Tone: [facility] for tasks assessed Left Lower Extremity Assessment LLE ROM/Strength/Tone: Deficits LLE ROM/Strength/Tone Deficits: Decr AROM and strength, limited by pain post hemiarthroplasty   Balance    End of Session PT - End of Session Equipment Utilized During Treatment: Gait belt Activity Tolerance: Patient tolerated treatment well Patient left: in chair;with call bell/phone within reach Nurse Communication: Mobility status  GP     Olen Pel [place], [place] [phone]  12/18/2012, 4:06 PM

## 2012-12-19 DIAGNOSIS — S72009A Fracture of unspecified part of neck of unspecified femur, initial encounter for closed fracture: Secondary | ICD-10-CM | POA: Diagnosis not present

## 2012-12-19 DIAGNOSIS — M8080XA Other osteoporosis with current pathological fracture, unspecified site, initial encounter for fracture: Secondary | ICD-10-CM

## 2012-12-19 DIAGNOSIS — D62 Acute posthemorrhagic anemia: Secondary | ICD-10-CM

## 2012-12-19 DIAGNOSIS — M810 Age-related osteoporosis without current pathological fracture: Secondary | ICD-10-CM

## 2012-12-19 DIAGNOSIS — M25559 Pain in unspecified hip: Secondary | ICD-10-CM | POA: Diagnosis not present

## 2012-12-19 LAB — PTH, INTACT AND CALCIUM
Calcium, Total (PTH): 8.1 mg/dL — ABNORMAL LOW (ref 8.4–10.5)
PTH: 84.2 pg/mL — ABNORMAL HIGH (ref 14.0–72.0)

## 2012-12-19 LAB — BASIC METABOLIC PANEL
CO2: 27 mEq/L (ref 19–32)
Calcium: 8 mg/dL — ABNORMAL LOW (ref 8.4–10.5)
Creatinine, Ser: 0.67 mg/dL (ref 0.50–1.10)
GFR calc Af Amer: >90 mL/min (ref >90)
GFR calc non Af Amer: 86 mL/min — ABNORMAL LOW (ref >90)

## 2012-12-19 LAB — CBC WITH DIFFERENTIAL/PLATELET
Basophils Absolute: 0 10*3/uL (ref 0.0–0.1)
Eosinophils Absolute: 0.2 10*3/uL (ref 0.0–0.7)
Eosinophils Relative: 2 % (ref 0–5)
Lymphocytes Relative: 12 % (ref 12–46)
MCV: 94.3 fL (ref 78.0–100.0)
Platelets: 170 10*3/uL (ref 150–400)
RDW: 12.6 % (ref 11.5–15.5)
WBC: 11.4 10*3/uL — ABNORMAL HIGH (ref 4.0–10.5)

## 2012-12-19 LAB — PREALBUMIN: Prealbumin: 13.4 mg/dL — ABNORMAL LOW (ref 17.0–34.0)

## 2012-12-19 MED ORDER — ADULT MULTIVITAMIN W/MINERALS CH: 1.0000 | ORAL_TABLET | Freq: Every day | ORAL | Status: DC

## 2012-12-19 MED ORDER — VITAMIN D3 25 MCG (1000 UNIT) PO TABS: 2000.0000 [IU] | ORAL_TABLET | Freq: Every day | ORAL | Status: DC

## 2012-12-19 MED ORDER — HYDROCODONE-ACETAMINOPHEN 5-325 MG PO TABS: 1.0000 | ORAL_TABLET | ORAL | Status: DC | PRN

## 2012-12-19 MED ORDER — HYDROCODONE-ACETAMINOPHEN 5-325 MG PO TABS: 2.0000 | ORAL_TABLET | ORAL | Status: DC | PRN

## 2012-12-19 MED ORDER — DSS 100 MG PO CAPS: 100.0000 mg | ORAL_CAPSULE | Freq: Two times a day (BID) | ORAL | Status: DC

## 2012-12-19 MED ORDER — THIAMINE HCL 100 MG PO TABS: 100.0000 mg | ORAL_TABLET | Freq: Every day | ORAL | Status: DC

## 2012-12-19 MED ORDER — PANTOPRAZOLE SODIUM 40 MG PO TBEC: 40.0000 mg | DELAYED_RELEASE_TABLET | Freq: Every day | ORAL | Status: DC

## 2012-12-19 MED ORDER — CYANOCOBALAMIN 1000 MCG PO TABS: 1000.0000 ug | ORAL_TABLET | Freq: Every day | ORAL | Status: DC

## 2012-12-19 MED ORDER — LORAZEPAM 1 MG PO TABS: 1.0000 mg | ORAL_TABLET | Freq: Four times a day (QID) | ORAL | Status: DC | PRN

## 2012-12-19 MED ORDER — ZOLPIDEM TARTRATE 5 MG PO TABS: 5.0000 mg | ORAL_TABLET | Freq: Once | ORAL | Status: DC

## 2012-12-19 MED ORDER — BISACODYL 10 MG RE SUPP: 10.0000 mg | Freq: Every day | RECTAL | Status: DC | PRN

## 2012-12-19 MED ORDER — POLYETHYLENE GLYCOL 3350 17 G PO PACK: 17.0000 g | PACK | Freq: Every day | ORAL | Status: DC

## 2012-12-19 MED ORDER — ENOXAPARIN SODIUM 40 MG/0.4ML ~~LOC~~ SOLN: 40.0000 mg | SUBCUTANEOUS | Status: DC

## 2012-12-19 MED ORDER — VITAMIN D (ERGOCALCIFEROL) 1.25 MG (50000 UNIT) PO CAPS: 50000.0000 [IU] | ORAL_CAPSULE | ORAL | Status: DC

## 2012-12-19 MED ORDER — ACETAMINOPHEN 325 MG PO TABS: 650.0000 mg | ORAL_TABLET | Freq: Four times a day (QID) | ORAL | Status: DC | PRN

## 2012-12-19 NOTE — Discharge Summary (Addendum)
Physician Discharge Summary  NAME:[name]  ZOX:[id number]  DOB: Jun 22, 1941   Admit date: 12/15/2012 Discharge date: 12/19/2012  Discharge Diagnoses:  Principal Problem:   Hip fracture, left - suffered after a fall.  Repair on 12/17/2012 by Dr. Myrene Galas.  Need subsequent rehabilitation at a skilled nursing facility Active Problems:   Gout - asymptomatic   fracture of fifth right metatarsal bone - suffered at the same time of fall where hip was fractured.  Will need flat shoe for at least 6 weeks and followup by Dr. Myrene Galas   Alcohol abuse - , will be discharged on lorazepam and will need to be monitored for further possibility of alcohol withdrawal.  Has never had severe alcohol withdrawal.  Will be on a standing dose of lorazepam that can be tapered off over time   GERD (gastroesophageal reflux disease) - currently asymptomatic 4 history of esophageal stricture.  Continue PPI therapy chronically   Vitamin D deficiency - severe vitamin D deficiency.  Continue replacement therapy   History of B12 deficiency - continue present therapy   History of hypertension - stable off medications currently.  Has taken benazepril 20 milligrams and amlodipine 5 milligrams in the past   History of situational depression - may benefit from antidepressant during skilled nursing facility stay.  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   History of Dupuytren's contracture right fifth and left fourth and fifth digits   History of bacterial vaginosis   History of hyperlipidemia   History of spontaneously passed renal calculus 1997   Discharge Physical Exam:  General Appearance: Alert, cooperative, no distress, appears stated age  Weight change:   Intake/Output Summary (Last 24 hours) at 12/19/12 0739 Last data filed at 12/18/12 2145  Gross per 24 hour   Intake    240 ml  Output    250 ml  Net    -10 ml   Filed Vitals:   12/18/12 1336 12/18/12 2036 12/18/12 2145 12/19/12 0558  BP: 156/74 139/68  123/66  Pulse: 95 92  86  Temp: 98.1 F (36.7 C) 100.4 F (38 C) 99.1 F (37.3 C) 98.9 F (37.2 C)  TempSrc:   Oral   Resp: 16 16  16   Height:      Weight:      SpO2: 100% 100%  98%   General: Alert awake and oriented x2-3. Certainly to name and place.  HEENT: Sclera are muddy and oropharynx is moist  Neck: Supple without masses or obvious lymphadenopathy  Heart: Regular rhythm without murmurs or gallops  Lungs: Clear to auscultation bilaterally  Extremities: No peripheral edema. Abrasion on left elbow healing. Left hip wound dressing is dry.  No surrounding erythema.  Tender in the right mid foot to palpation  Neurological: Alert without significant tremor. No focal deficits.  Conversive and oriented Psych: Alert and calm  Skin: Warm and dry  Discharge Condition: Improved  Hospital Course: Mrs. [name] is a very pleasant 72 year old female who I followed for many many years who intermittently comes to my office but not consistently.  She has a history of hypertension and chronic alcohol use.  On the night prior to admission, 12/15/2012, she was walking on an uneven surface outside of her house on gravel and she tripped.  She estimates to drinking beer lately and I recently saw her  in my office where she admitted to 5 or 6 beers a day.  She was brought to the [facility] emergency room on may fifth 2014 and found to have a left hip fracture as well as a right fifth metatarsal fracture.  She has had some mild alcohol withdrawal while hospitalized with some tremulousness but no disorientation or confusion.  This has improved.  She will now need skilled nursing facility placement for probably 4-8 weeks for rehabilitation secondary to her to fractures  Things to follow up in the outpatient setting: Physical therapy and occupational therapy  in a skilled nursing facility setting.  Important to monitor for possible alcohol withdrawal.  She has a standing order for Ativan which can be increased or decreased or tapered off depending on how she is doing terms of anxiety or tremulousness.  Mrs. true is a very gracious person and should be quite used to work with at the skilled nursing facility.  Her son has ultimately been interested in having her move from her home to his home but she has declined this thus far.  Consults: Treatment Team:  Budd Palmer, MD  Disposition: 01-Home or Self Care  Discharge Orders   Future Orders Complete By Expires     Call MD for:  difficulty breathing, headache or visual disturbances  As directed     Call MD for:  persistant nausea and vomiting  As directed     Call MD for:  redness, tenderness, or signs of infection (pain, swelling, redness, odor or green/yellow discharge around incision site)  As directed     Call MD for:  severe uncontrolled pain  As directed     Call MD for:  temperature >100.4  As directed     Diet - low sodium heart healthy  As directed     Discharge instructions  As directed     Comments:      Notify M.D. for excessive agitation.  Hold lorazepam for decreased level of consciousness or lethargy.  Monitor for alcohol withdrawal    Increase activity slowly  As directed         Medication List    TAKE these medications       acetaminophen 325 MG tablet  Commonly known as:  TYLENOL  Take 2 tablets (650 mg total) by mouth every 6 (six) hours as needed.     bisacodyl 10 MG suppository  Commonly known as:  DULCOLAX  Place 1 suppository (10 mg total) rectally daily as needed.     cholecalciferol 1000 UNITS tablet  Commonly known as:  VITAMIN D  Take 2 tablets (2,000 Units total) by mouth daily.     cyanocobalamin 1000 MCG tablet  Take 1 tablet (1,000 mcg total) by mouth daily.     DSS 100 MG Caps  Take 100 mg by mouth 2 (two) times daily.     enoxaparin 40 MG/0.4ML  injection  Commonly known as:  LOVENOX  Inject 0.4 mLs (40 mg total) into the skin daily.     HYDROcodone-acetaminophen 5-325 MG per tablet  Commonly known as:  NORCO/VICODIN  Take 1 tablet by mouth every 4 (four) hours as needed.     HYDROcodone-acetaminophen 5-325 MG per tablet  Commonly known as:  NORCO/VICODIN  Take 2 tablets by mouth every 4 (four) hours as needed.     LORazepam 1 MG tablet  Commonly known as:  ATIVAN  Take 1 tablet (1 mg total) by mouth every 6 (six) hours as  needed for anxiety (Anxiety/tremor).     multivitamin with minerals Tabs  Take 1 tablet by mouth daily.     pantoprazole 40 MG tablet  Commonly known as:  PROTONIX  Take 1 tablet (40 mg total) by mouth daily.     polyethylene glycol packet  Commonly known as:  MIRALAX / GLYCOLAX  Take 17 g by mouth daily.     thiamine 100 MG tablet  Take 1 tablet (100 mg total) by mouth daily.     Vitamin D (Ergocalciferol) 50000 UNITS Caps  Commonly known as:  DRISDOL  Take 1 capsule (50,000 Units total) by mouth every 7 (seven) days.                  The results of significant diagnostics from this hospitalization (including imaging, microbiology, ancillary and laboratory) are listed below for reference.    Significant Diagnostic Studies: Dg Chest 1 View  12/16/2012  *RADIOLOGY REPORT*  Clinical Data: Fall, left hip fracture.  CHEST - 1 VIEW  Comparison: 06/21/2007  Findings: Heart and mediastinal contours are within normal limits. No focal opacities or effusions.  No acute bony abnormality.  IMPRESSION: No active cardiopulmonary disease.   Original Report Authenticated By: Charlett Nose, M.D.    Dg Elbow Complete Left  12/16/2012  *RADIOLOGY REPORT*  Clinical Data: Fall, pain.  LEFT ELBOW - COMPLETE 3+ VIEW  Comparison: None.  Findings: No acute bony abnormality.  Specifically, no fracture, subluxation, or dislocation.  Soft tissues are intact.  No joint effusion.  IMPRESSION: No acute bony abnormality.    Original Report Authenticated By: Charlett Nose, M.D.    Dg Hip Complete Left  12/16/2012  *RADIOLOGY REPORT*  Clinical Data: Fall, left hip pain.  LEFT HIP - COMPLETE 2+ VIEW  Comparison: None.  Findings: There is a left femoral neck fracture with varus angulation.  Mild symmetric degenerative changes in the hips bilaterally.  No subluxation or dislocation.  IMPRESSION: Left femoral neck fracture with varus angulation.   Original Report Authenticated By: Charlett Nose, M.D.    Dg Pelvis Portable  12/17/2012  *RADIOLOGY REPORT*  Clinical Data: Postop left total hip arthroplasty.  PORTABLE PELVIS  Comparison: CT urogram 08/17/2004.  Findings: 1110 hours.  The patient is status post left hip bipolar hemiarthroplasty.  The distal end of the femoral prosthesis is not imaged.  There is no evidence of acute fracture or dislocation. Mild soft tissue emphysema is noted lateral to the proximal left femur.  IMPRESSION: No demonstrated complication following hip hemiarthroplasty.   Original Report Authenticated By: Carey Bullocks, M.D.    Dg Hip Portable 1 View Left  12/17/2012  *RADIOLOGY REPORT*  Clinical Data: Postop left total hip arthroplasty.  PORTABLE LEFT HIP - 1 VIEW  Comparison: Hip radiographs 12/15/2012.  Findings: Cross-table lateral view 1113 hours.  Patient is status post left hip hemiarthroplasty.  The hardware appears well positioned.  There is no evidence of acute fracture or dislocation.  IMPRESSION: No demonstrated complication following left hip hemiarthroplasty.   Original Report Authenticated By: Carey Bullocks, M.D.    Dg Knee Left Port  12/16/2012  *RADIOLOGY REPORT*  Clinical Data: Left knee pain.  Tibial plateau fracture.  PORTABLE LEFT KNEE - 1-2 VIEW  Comparison: 04/19/2006.  Findings: Diffuse osteopenia is present.  The alignment the knee appears anatomic.  Mottling of the musculature of the thigh and leg suggests disuse or denervation atrophy.  No gross knee effusion. Postoperative changes of  the tibial plateau with bone cement  in the medial plateau and lateral buttress plate and screw fixation.  No complicating features.  Tibial plateau fracture appears healed.  IMPRESSION: Healed left tibial plateau fracture.  Osteopenia and diffuse atrophy of the left lower extremity.  No acute abnormality.   Original Report Authenticated By: Andreas Newport, M.D.    Dg Foot Complete Right  12/09/2012  *RADIOLOGY REPORT*  Clinical Data: Fall last night with pain and soft tissue swelling in the lateral right foot.  RIGHT FOOT COMPLETE - 3+ VIEW  Comparison: 07/16/2012  Findings: Diffuse bone demineralization.  Transverse fracture of the distal right fifth metatarsal shaft with mild medial angulation of the distal fracture fragment.  Soft tissue swelling is present. Degenerative changes in the first metatarsophalangeal joint and in the interphalangeal joints.  Vascular calcifications.  Achilles calcaneal spur.  No destructive or expansile bone lesions.  No radiopaque soft tissue foreign bodies.  IMPRESSION: Acute transverse fracture of the distal right fifth metatarsal shaft.  Diffuse bone demineralization.   Original Report Authenticated By: Burman Nieves, M.D.     Microbiology: Recent Results (from the past 240 hour(s))  SURGICAL PCR SCREEN     Status: None   Collection Time    12/16/12 11:28 PM      Result Value Range Status   MRSA, PCR NEGATIVE  NEGATIVE Final   Staphylococcus aureus NEGATIVE  NEGATIVE Final   Comment:            The Xpert SA Assay (FDA     approved for NASAL specimens     in patients over 62 years of age),     is one component of     a comprehensive surveillance     program.  Test performance has     been validated by [employer] for patients greater     than or equal to 50 year old.     It is not intended     to diagnose infection nor to     guide or monitor treatment.     Labs: Results for orders placed during the hospital encounter of 12/15/12  SURGICAL PCR  SCREEN      Result Value Range   MRSA, PCR NEGATIVE  NEGATIVE   Staphylococcus aureus NEGATIVE  NEGATIVE  CBC      Result Value Range   WBC 7.8  4.0 - 10.5 K/uL   RBC 4.21  3.87 - 5.11 MIL/uL   Hemoglobin 13.6  12.0 - 15.0 g/dL   HCT 16.1  09.6 - 04.5 %   MCV 94.8  78.0 - 100.0 fL   MCH 32.3  26.0 - 34.0 pg   MCHC 34.1  30.0 - 36.0 g/dL   RDW 40.9  81.1 - 91.4 %   Platelets 249  150 - 400 K/uL  URINALYSIS, ROUTINE W REFLEX MICROSCOPIC      Result Value Range   Color, Urine YELLOW  YELLOW   APPearance CLEAR  CLEAR   Specific Gravity, Urine 1.010  1.005 - 1.030   pH 5.0  5.0 - 8.0   Glucose, UA NEGATIVE  NEGATIVE mg/dL   Hgb urine dipstick NEGATIVE  NEGATIVE   Bilirubin Urine NEGATIVE  NEGATIVE   Ketones, ur NEGATIVE  NEGATIVE mg/dL   Protein, ur NEGATIVE  NEGATIVE mg/dL   Urobilinogen, UA 0.2  0.0 - 1.0 mg/dL   Nitrite NEGATIVE  NEGATIVE   Leukocytes, UA NEGATIVE  NEGATIVE  CBC      Result Value Range  WBC 10.7 (*) 4.0 - 10.5 K/uL   RBC 3.88  3.87 - 5.11 MIL/uL   Hemoglobin 12.2  12.0 - 15.0 g/dL   HCT 40.9  81.1 - 91.4 %   MCV 95.1  78.0 - 100.0 fL   MCH 31.4  26.0 - 34.0 pg   MCHC 33.1  30.0 - 36.0 g/dL   RDW 78.2  95.6 - 21.3 %   Platelets 216  150 - 400 K/uL  CREATININE, SERUM      Result Value Range   Creatinine, Ser 0.54  0.50 - 1.10 mg/dL   GFR calc non Af Amer >90  >90 mL/min   GFR calc Af Amer >90  >90 mL/min  VITAMIN D 25 HYDROXY      Result Value Range   Vit D, 25-Hydroxy 10 (*) 30 - 89 ng/mL  VITAMIN B12      Result Value Range   Vitamin B-12 323  211 - 911 pg/mL  VITAMIN D 25 HYDROXY      Result Value Range   Vit D, 25-Hydroxy 11 (*) 30 - 89 ng/mL  HEPATIC FUNCTION PANEL      Result Value Range   Total Protein 6.6  6.0 - 8.3 g/dL   Albumin 3.2 (*) 3.5 - 5.2 g/dL   AST 26  0 - 37 U/L   ALT 23  0 - 35 U/L   Alkaline Phosphatase 98  39 - 117 U/L   Total Bilirubin 1.1  0.3 - 1.2 mg/dL   Bilirubin, Direct 0.3  0.0 - 0.3 mg/dL   Indirect Bilirubin  0.8  0.3 - 0.9 mg/dL  LIPID PANEL      Result Value Range   Cholesterol 170  0 - 200 mg/dL   Triglycerides 086 (*) <150 mg/dL   HDL 37 (*) >57 mg/dL   Total CHOL/HDL Ratio 4.6     VLDL UNABLE TO CALCULATE IF TRIGLYCERIDE OVER 400 mg/dL  0 - 40 mg/dL   LDL Cholesterol UNABLE TO CALCULATE IF TRIGLYCERIDE OVER 400 mg/dL  0 - 99 mg/dL  GLUCOSE, CAPILLARY      Result Value Range   Glucose-Capillary 129 (*) 70 - 99 mg/dL  CBC      Result Value Range   WBC 12.4 (*) 4.0 - 10.5 K/uL   RBC 3.34 (*) 3.87 - 5.11 MIL/uL   Hemoglobin 10.7 (*) 12.0 - 15.0 g/dL   HCT 84.6 (*) 96.2 - 95.2 %   MCV 94.6  78.0 - 100.0 fL   MCH 32.0  26.0 - 34.0 pg   MCHC 33.9  30.0 - 36.0 g/dL   RDW 84.1  32.4 - 40.1 %   Platelets 191  150 - 400 K/uL  BASIC METABOLIC PANEL      Result Value Range   Sodium 134 (*) 135 - 145 mEq/L   Potassium 4.3  3.5 - 5.1 mEq/L   Chloride 100  96 - 112 mEq/L   CO2 26  19 - 32 mEq/L   Glucose, Bld 134 (*) 70 - 99 mg/dL   BUN 7  6 - 23 mg/dL   Creatinine, Ser 0.27  0.50 - 1.10 mg/dL   Calcium 7.9 (*) 8.4 - 10.5 mg/dL   GFR calc non Af Amer 81 (*) >90 mL/min   GFR calc Af Amer >90  >90 mL/min  CBC WITH DIFFERENTIAL      Result Value Range   WBC 11.4 (*) 4.0 - 10.5 K/uL   RBC 2.98 (*) 3.87 -  5.11 MIL/uL   Hemoglobin 9.4 (*) 12.0 - 15.0 g/dL   HCT 16.1 (*) 09.6 - 04.5 %   MCV 94.3  78.0 - 100.0 fL   MCH 31.5  26.0 - 34.0 pg   MCHC 33.5  30.0 - 36.0 g/dL   RDW 40.9  81.1 - 91.4 %   Platelets 170  150 - 400 K/uL   Neutrophils Relative 77  43 - 77 %   Neutro Abs 8.8 (*) 1.7 - 7.7 K/uL   Lymphocytes Relative 12  12 - 46 %   Lymphs Abs 1.3  0.7 - 4.0 K/uL   Monocytes Relative 9  3 - 12 %   Monocytes Absolute 1.0  0.1 - 1.0 K/uL   Eosinophils Relative 2  0 - 5 %   Eosinophils Absolute 0.2  0.0 - 0.7 K/uL   Basophils Relative 0  0 - 1 %   Basophils Absolute 0.0  0.0 - 0.1 K/uL  POCT I-STAT, CHEM 8      Result Value Range   Sodium 135  135 - 145 mEq/L   Potassium 4.1  3.5 -  5.1 mEq/L   Chloride 100  96 - 112 mEq/L   BUN 4 (*) 6 - 23 mg/dL   Creatinine, Ser 7.82  0.50 - 1.10 mg/dL   Glucose, Bld 956 (*) 70 - 99 mg/dL   Calcium, Ion 2.13 (*) 1.13 - 1.30 mmol/L   TCO2 24  0 - 100 mmol/L   Hemoglobin 14.3  12.0 - 15.0 g/dL   HCT 08.6  57.8 - 46.9 %  TYPE AND SCREEN      Result Value Range   ABO/RH(D) O POS     Antibody Screen NEG     Sample Expiration 12/19/2012    ABO/RH      Result Value Range   ABO/RH(D) O POS      Time coordinating discharge: 40 minutes  Signed: Pearla Dubonnet, MD 12/19/2012, 7:39 AM

## 2012-12-19 NOTE — Progress Notes (Signed)
Physical Therapy Treatment Patient Details Name: [name] MRN: [id number] DOB: [birth year] Today's Date: 12/19/2012 Time: 6295-2841 PT Time Calculation (min): 23 min  PT Assessment / Plan / Recommendation Comments on Treatment Session  Pt. making steady gains with mobility and gait.  Needs ongoing rehab at SNF before Dc home.      Follow Up Recommendations  SNF     Does the patient have the potential to tolerate intense rehabilitation     Barriers to Discharge        Equipment Recommendations  Rolling walker with 5" wheels    Recommendations for Other Services    Frequency Min 5X/week   Plan Discharge plan remains appropriate;Frequency remains appropriate    Precautions / Restrictions Precautions Precautions: Posterior Hip;Fall Precaution Comments: reviewed posterior hip precautions with pt; pt. 0/3 in recall of precautions Required Braces or Orthoses: Other Brace/Splint Other Brace/Splint: R foot post op shoe Restrictions Weight Bearing Restrictions: Yes LLE Weight Bearing: Partial weight bearing LLE Partial Weight Bearing Percentage or Pounds: 50   Pertinent Vitals/Pain See vitals tab     Mobility  Bed Mobility Bed Mobility: Supine to Sit;Sitting - Scoot to Edge of Bed Supine to Sit: 4: Min assist;With rails Sitting - Scoot to [place] of Bed: 4: Min assist Details for Bed Mobility Assistance: reminders for hip precautions and technique Transfers Transfers: Sit to Stand;Stand to Sit Sit to Stand: 4: Min assist;From bed;With upper extremity assist Stand to Sit: 4: Min assist;To bed;With upper extremity assist Details for Transfer Assistance: cues for technique and hip precautions Ambulation/Gait Ambulation/Gait Assistance: 4: Min assist Ambulation Distance (Feet): 40 Feet Assistive device: Rolling walker Ambulation/Gait Assistance Details: cues for sequencing, min assist for stability Gait Pattern: Step-through pattern Gait velocity: decreased Stairs: No     Exercises     PT Diagnosis:    PT Problem List:   PT Treatment Interventions:     PT Goals Acute Rehab PT Goals Pt will go Supine/Side to Sit: with supervision PT Goal: Supine/Side to Sit - Progress: Progressing toward goal Pt will go Sit to Supine/Side: with supervision PT Goal: Sit to Supine/Side - Progress: Progressing toward goal Pt will go Sit to Stand: with supervision PT Goal: Sit to Stand - Progress: Progressing toward goal Pt will go Stand to Sit: with supervision PT Goal: Stand to Sit - Progress: Progressing toward goal Pt will Ambulate: >150 feet;with supervision;with rolling walker PT Goal: Ambulate - Progress: Progressing toward goal  Visit Information  Last PT Received On: 12/19/12 Assistance Needed: +1    Subjective Data  Subjective: [facility]   Cognition  Cognition Arousal/Alertness: Awake/alert Behavior During Therapy: WFL for tasks assessed/performed Overall Cognitive Status: Within Functional Limits for tasks assessed    Balance  Balance Balance Assessed: No  End of Session PT - End of Session Equipment Utilized During Treatment: Gait belt Activity Tolerance: Patient tolerated treatment well Patient left: in bed;with call bell/phone within reach Nurse Communication: Mobility status   GP     Ferman Hamming 12/19/2012, 3:16 PM Weldon Picking PT Acute Rehab Services [phone] Beeper [phone]

## 2012-12-19 NOTE — Progress Notes (Signed)
[facility] Progress Note   2 Days Post-Op  Subjective   Doing much better this am Was able to walk to the door with therapy yesterday No acute issues this am  L hip sore but pain improving overall   Objective  BP 123/66  Pulse 86  Temp(Src) 98.9 F (37.2 C) (Oral)  Resp 16  Ht 5\' 10"  (1.778 m)  Wt 65 kg (143 lb 4.8 oz)  BMI 20.56 kg/m2  SpO2 98%  Patient Vitals for the past 24 hrs:  BP Temp Temp src Pulse Resp SpO2  12/19/12 0558 123/66 mmHg 98.9 F (37.2 C) - 86 16 98 %  12/18/12 2145 - 99.1 F (37.3 C) Oral - - -  12/18/12 2036 139/68 mmHg 100.4 F (38 C) - 92 16 100 %  12/18/12 1336 156/74 mmHg 98.1 F (36.7 C) - 95 16 100 %    Intake/Output     05/07 0701 - 05/08 0700 05/08 0701 - 05/09 0700   P.O. 240    I.V. (mL/kg)     Other     Total Intake(mL/kg) 240 (3.7)    Urine (mL/kg/hr) 250 (0.2)    Stool  1 (0)   Blood     Total Output 250 1   Net -10 -1        Urine Occurrence 1 x      Labs Results for KAMALA, KOLTON (MRN [id number]) as of 12/19/2012 08:20  Ref. Range 12/19/2012 05:00  WBC Latest Range: 4.0-10.5 K/uL 11.4 (H)  RBC Latest Range: 3.87-5.11 MIL/uL 2.98 (L)  Hemoglobin Latest Range: 12.0-15.0 g/dL 9.4 (L)  HCT Latest Range: 36.0-46.0 % 28.1 (L)  MCV Latest Range: 78.0-100.0 fL 94.3  MCH Latest Range: 26.0-34.0 pg 31.5  MCHC Latest Range: 30.0-36.0 g/dL 62.1  RDW Latest Range: 11.5-15.5 % 12.6  Platelets Latest Range: 150-400 K/uL 170    Exam  Gen: awake and alert, resting comfortably in bed Lungs: clear anteriorly Cardiac: s1 and s2, reg Abd:  + BS, NT Ext:           Left Lower Extremity  Incision c/d/i  No signs of infection   Distal motor and sensory functions intact  + DP pulse  Ext warm  No DCT  Swelling stable    Assessment and Plan  2 Days Post-Op  72 y/o female s/p fall  1. Left femoral neck fracture s/p L hip hemiarthroplasty POD 2             PWB L hip (50 %)             Posterior hip  precautions               PT/OT             Dressing change prn             TED hose             Abduction pillow when in Bed   R fifth MTT fx   WBAT in post op shoe   2. EtOH dependence             No signs of withdrawal  3. Medical issues             Per Dr. Kevan Ny  4. Vitamin D deficiency/ Osteoporosis             Pt clinically with osteoporosis  Discuss outpt tx with additional pharmacologic agents (prolia or forteo)             Will need outpt DEXA                          Started on Vitamin D replacement (D2 and D3)              5. Pain management:             Continue with low dose narcs             Po tylenol  6. DVT/PE prophylaxis:             Lovenox x 3 weeks 7. ID:               Completed post op course  8. Activity:             OOB with assist             See #1  9. FEN/Foley/Lines:             KVO IVF               10. Impediments to fracture healing:             Immobility             Vitamin d deficiency             EtOH use             Falls  11. ABL anemia  Expected blood loss from fracture and procedure  Asymptomatic  Monitor               12. Dispo:             PT/OT              stable for d/c to snf from ortho standpoint  Follow up with ortho in 10-14 days     Mearl Latin, PA-C [facility] [phone] (P) 12/19/2012 8:17 AM

## 2012-12-19 NOTE — Evaluation (Signed)
Occupational Therapy Evaluation Patient Details Name: [name] MRN: [id number] DOB: Jan 09, 1941 Today's Date: 12/19/2012 Time: 2952-8413 OT Time Calculation (min): 33 min  OT Assessment / Plan / Recommendation Clinical Impression  Pt demos decline in function with ADLs and ADL mobility following intertrochanteric fx from falling, now s/p Hip hemiarthroplasty. Pt would benefit from OT services to address these impairments to help restore PLOF    OT Assessment  Patient needs continued OT Services    Follow Up Recommendations  SNF    Barriers to Discharge Decreased caregiver support Pt lives at home alone, plams to d/c to SNF for further therapy  Equipment Recommendations  None recommended by OT;Other (comment) (TBD)    Recommendations for Other Services    Frequency  Min 2X/week    Precautions / Restrictions Precautions Precautions: Anterior Hip;Fall Required Braces or Orthoses: Other Brace/Splint Other Brace/Splint: R foot post op shoe Restrictions Weight Bearing Restrictions: Yes LLE Weight Bearing: Weight bearing as tolerated LLE Partial Weight Bearing Percentage or Pounds: 50   Pertinent Vitals/Pain 6/10 L hip    ADL  Grooming: Performed;Wash/dry hands;Wash/dry face;Min guard Where Assessed - Grooming: Supported standing Upper Body Bathing: Simulated;Supervision/safety;Set up Lower Body Bathing: Simulated;Maximal assistance Upper Body Dressing: Performed;Supervision/safety;Set up Lower Body Dressing: Simulated;Maximal assistance Toilet Transfer: Performed;Minimal assistance Toilet Transfer Method: Sit to stand Toilet Transfer Equipment: Raised toilet seat with arms (or 3-in-1 over toilet);Grab bars Toileting - Clothing Manipulation and Hygiene: Moderate assistance Where Assessed - Toileting Clothing Manipulation and Hygiene: Standing Transfers/Ambulation Related to ADLs: verbal cues for saftey, correct hand placement ADL Comments: pt familiar with ADL A/E, was a  [name] for many years. Reviewed A/E with pt    OT Diagnosis: Generalized weakness;Acute pain  OT Problem List: Decreased strength;Impaired balance (sitting and/or standing);Pain;Decreased activity tolerance OT Treatment Interventions: Self-care/ADL training;Balance training;Therapeutic exercise;Neuromuscular education;Therapeutic activities;DME and/or AE instruction;Patient/family education   OT Goals Acute Rehab OT Goals OT Goal Formulation: With patient Time For Goal Achievement: 12/26/12 Potential to Achieve Goals: Good ADL Goals Pt Will Perform Grooming: with set-up;with supervision;Standing at sink ADL Goal: Grooming - Progress: Goal set today Pt Will Perform Lower Body Bathing: with mod assist;with adaptive equipment ADL Goal: Lower Body Bathing - Progress: Goal set today Pt Will Perform Lower Body Dressing: with mod assist;with adaptive equipment ADL Goal: Lower Body Dressing - Progress: Goal set today Pt Will Transfer to Toilet: with supervision;with DME;Grab bars ADL Goal: Toilet Transfer - Progress: Goal set today Pt Will Perform Toileting - Clothing Manipulation: with min assist;with supervision;Standing ADL Goal: Toileting - Clothing Manipulation - Progress: Goal set today Pt Will Perform Toileting - Hygiene: with min assist;with supervision;Sitting on 3-in-1 or toilet;Standing at 3-in-1/toilet ADL Goal: Toileting - Hygiene - Progress: Goal set today  Visit Information  Last OT Received On: 12/19/12 Assistance Needed: +1    Subjective Data  Subjective: " I feel ok, it just hurts " Patient Stated Goal: To return home after therapy at SNF   Prior Functioning     Home Living Lives With: Alone Available Help at Discharge: Family;Friend(s) Type of Home: Apartment Home Access: Stairs to enter Entrance Stairs-Number of Steps: 2 Entrance Stairs-Rails: None Home Layout: One level Bathroom Shower/Tub: Forensic scientist: Standard Bathroom  Accessibility: Yes Home Adaptive Equipment: None Additional Comments: Planning to go to SNF Prior Function Level of Independence: Independent Able to Take Stairs?: Yes Driving: Yes Vocation: Part time employment Communication Communication: No difficulties Dominant Hand: Right  Vision/Perception Vision - History Baseline Vision: Wears glasses only for reading Patient Visual Report: No change from baseline Perception Perception: Within Functional Limits   Cognition  Cognition Arousal/Alertness: Awake/alert Behavior During Therapy: WFL for tasks assessed/performed Overall Cognitive Status: Within Functional Limits for tasks assessed    Extremity/Trunk Assessment Right Upper Extremity Assessment RUE ROM/Strength/Tone: WFL for tasks assessed RUE Sensation: WFL - Light Touch;WFL - Proprioception RUE Coordination: WFL - gross/fine motor Left Upper Extremity Assessment LUE ROM/Strength/Tone: WFL for tasks assessed LUE Sensation: WFL - Light Touch;WFL - Proprioception LUE Coordination: WFL - gross/fine motor     Mobility Bed Mobility Bed Mobility: Supine to Sit;Sitting - Scoot to Edge of Bed Supine to Sit: 4: Min assist;With rails Sitting - Scoot to [place] of Bed: 4: Min assist Details for Bed Mobility Assistance: Cues for hip precautions and technique; physical assist to elevate trunk off of bed Transfers Sit to Stand: 4: Min assist;From bed;With upper extremity assist Stand to Sit: 4: Min assist;To chair/3-in-1;With upper extremity assist     Exercise     Balance Balance Balance Assessed: No   End of Session OT - End of Session Equipment Utilized During Treatment: Gait belt;Other (comment) (RW, 3 in 1, ADL A/E, post op R shoe) Activity Tolerance: Patient tolerated treatment well;Patient limited by pain Patient left: in chair;with call bell/phone within reach;with family/visitor present  GO     [name] 12/19/2012, 1:10 PM

## 2012-12-19 NOTE — Consult Note (Signed)
I have seen and examined the patient. I agree with the findings above.  L fem neck fracture, displaced Left LEx Hip TTP with palpation, pain with movement  Knee and ankle nontender Thigh and lower leg nontender Foot w/o pain No gross deformities noted Ext warm + DP pulse Compartments soft and NT DPN, SPN, TN sensation intact  EHL, FHL, AT, PT, peroneals, gastroc motor intact  R foot 5th MTT frx  PLAN: L hip hemiarthroplasty  I discussed with the patient the risks and benefits of surgery, including the possibility of infection, nerve injury, vessel injury, wound breakdown, arthritis, symptomatic hardware, DVT/ PE, loss of motion, need for conversion to THA, instability, and need for other surgery among others.  She understood these risks and wished to proceed.     Budd Palmer, MD

## 2012-12-19 NOTE — Plan of Care (Signed)
Problem: Phase III Progression Outcomes Goal: Discharge plan remains appropriate-arrangements made Pt plans to d/c to SNF for further therapy after acute care d/c

## 2012-12-19 NOTE — Progress Notes (Signed)
I saw and examined the patient with [name], communicating the findings and plan noted above.  Tyann Niehaus, MD [facility] [phone] (p)  

## 2012-12-20 LAB — VITAMIN D 1,25 DIHYDROXY
Vitamin D 1, 25 (OH)2 Total: 50 pg/mL (ref 18–72)
Vitamin D2 1, 25 (OH)2: <8 pg/mL
Vitamin D3 1, 25 (OH)2: 50 pg/mL

## 2012-12-25 ENCOUNTER — Encounter: Payer: Self-pay | Admitting: Internal Medicine

## 2012-12-25 ENCOUNTER — Non-Acute Institutional Stay (SKILLED_NURSING_FACILITY): Payer: Medicare Other | Admitting: Internal Medicine

## 2012-12-25 DIAGNOSIS — D62 Acute posthemorrhagic anemia: Secondary | ICD-10-CM

## 2012-12-25 DIAGNOSIS — S72002S Fracture of unspecified part of neck of left femur, sequela: Secondary | ICD-10-CM

## 2012-12-25 DIAGNOSIS — I4891 Unspecified atrial fibrillation: Secondary | ICD-10-CM

## 2012-12-25 DIAGNOSIS — S72009S Fracture of unspecified part of neck of unspecified femur, sequela: Secondary | ICD-10-CM

## 2012-12-25 DIAGNOSIS — E559 Vitamin D deficiency, unspecified: Secondary | ICD-10-CM

## 2012-12-25 DIAGNOSIS — F329 Major depressive disorder, single episode, unspecified: Secondary | ICD-10-CM

## 2012-12-25 DIAGNOSIS — M8080XS Other osteoporosis with current pathological fracture, unspecified site, sequela: Secondary | ICD-10-CM

## 2012-12-25 DIAGNOSIS — E785 Hyperlipidemia, unspecified: Secondary | ICD-10-CM

## 2012-12-25 DIAGNOSIS — M109 Gout, unspecified: Secondary | ICD-10-CM

## 2012-12-25 DIAGNOSIS — F1010 Alcohol abuse, uncomplicated: Secondary | ICD-10-CM

## 2012-12-25 DIAGNOSIS — F4321 Adjustment disorder with depressed mood: Secondary | ICD-10-CM

## 2012-12-25 DIAGNOSIS — IMO0002 Reserved for concepts with insufficient information to code with codable children: Secondary | ICD-10-CM

## 2012-12-25 NOTE — Progress Notes (Signed)
Date: 02/10/2013  MRN:  [id number] Name:  [name] Sex:  female Age:  72 y.o. DOB:1941-04-01   PSC #:                       Location:  [facility] Provider: Gwenith Spitz. Renato Gails, D.O., C.M.D.  Emergency Contacts: Contact Information   Name Relation Home Work [place]  [phone]     Mason Jim  [phone]        Code Status:  Full code  Allergies: Allergies  Allergen Reactions  . Ambien (Zolpidem Tartrate)     Causes nightmares     Chief Complaint  Patient presents with  . Hospitalization Follow-up    new admission s/p hospitalization for left hip fracture   HPI:  72 yo female with h/o alcohol abuse, hypertension, gout, GERD, 5th Metatarsal fx, vitamin D and vitamin B12 deficiencies, depression, paroxysmal afib, left frontal stroke in 2/07, Dupuytren's contracture, hyperlipidemia, and renal calculi was admitted here for short term rehab s/p hospitalization 12/17/12 after she fell on gravel outside her home and fractured her left hip.  She underwent repair by Dr. Myrene Galas.    When seen, she was in bed, and c/o left foot pain between her second and third toes.    Review of Systems  Constitutional: Negative for fever, chills and malaise/fatigue.  Respiratory: Negative for shortness of breath.   Cardiovascular: Negative for chest pain.  Gastrointestinal: Positive for heartburn. Negative for constipation, blood in stool and melena.  Genitourinary: Negative for dysuria.  Musculoskeletal: Positive for joint pain and falls.  Skin: Negative for rash.  Neurological: Positive for weakness. Negative for seizures and headaches.  Psychiatric/Behavioral: Positive for depression and memory loss. The patient is nervous/anxious.     Past Medical History  Diagnosis Date  . Hypertension   . Gout   . Closed left hip fracture 5/14  . Alcohol abuse   . GERD (gastroesophageal reflux disease)   . Vitamin D deficiency   . Vitamin B12 deficiency    . Depression   . Cerebrovascular accident (stroke) 2/07    left frontal lobe  . Dupuytren's contracture   . Bacterial vaginosis   . Hyperlipidemia LDL goal <70   . Fibrocystic breast disease   . Paroxysmal a-fib   . Renal calculus   . Fracture of metatarsal bone of right foot     5th metatarsal    Past Surgical History  Procedure Laterality Date  . Leg surgery    . Wrist surgery      left wrist  . Hip arthroplasty Left 12/17/2012    Procedure: LEFT HIP HEMI ARTHROPLASTY ;  Surgeon: Budd Palmer, MD;  Location: [facility] OR;  Service: Orthopedics;  Laterality: Left;     Procedures: 12/17/12:  PORTABLE LEFT HIP - 1 VIEW No demonstrated complication following left hip hemiarthroplasty.   12/17/12:  PORTABLE PELVIS No demonstrated complication following hip hemiarthroplasty  12/15/12:  PORTABLE LEFT KNEE - 1-2 VIEW Healed left tibial plateau fracture.  Osteopenia and diffuse atrophy of the left lower extremity.  No acute abnormality  12/15/12:  CHEST - 1 VIEW No active cardiopulmonary disease.   12/15/12:  LEFT HIP - COMPLETE 2+ VIEW Left femoral neck fracture with varus angulation.  12/15/12:  LEFT ELBOW - COMPLETE 3+ VIEW  No acute bony abnormality.  Consultants:   Dr. Casimiro Needle Handy--orthopedics  PCP:  Dr. Marden Noble  Current Outpatient Prescriptions  Medication Sig Dispense Refill  .  acetaminophen (TYLENOL) 325 MG tablet Take 2 tablets (650 mg total) by mouth every 6 (six) hours as needed.  30 tablet  1  . bisacodyl (DULCOLAX) 10 MG suppository Place 1 suppository (10 mg total) rectally daily as needed.  12 suppository  1  . cholecalciferol (VITAMIN D) 1000 UNITS tablet Take 2 tablets (2,000 Units total) by mouth daily.  30 tablet  1  . docusate sodium 100 MG CAPS Take 100 mg by mouth 2 (two) times daily.  10 capsule  1  . enoxaparin (LOVENOX) 40 MG/0.4ML injection Inject 0.4 mLs (40 mg total) into the skin daily.  24 Syringe  0  . HYDROcodone-acetaminophen (NORCO/VICODIN) 5-325  MG per tablet Take 1 tablet by mouth every 4 (four) hours as needed.  30 tablet  1  . HYDROcodone-acetaminophen (NORCO/VICODIN) 5-325 MG per tablet Take 2 tablets by mouth every 4 (four) hours as needed.  30 tablet  1  . LORazepam (ATIVAN) 1 MG tablet Take 1 tablet (1 mg total) by mouth every 6 (six) hours as needed for anxiety (Anxiety/tremor).  30 tablet  1  . Multiple Vitamin (MULTIVITAMIN WITH MINERALS) TABS Take 1 tablet by mouth daily.  30 tablet  1  . pantoprazole (PROTONIX) 40 MG tablet Take 1 tablet (40 mg total) by mouth daily.  30 tablet  1  . polyethylene glycol (MIRALAX / GLYCOLAX) packet Take 17 g by mouth daily.  14 each  1  . thiamine 100 MG tablet Take 1 tablet (100 mg total) by mouth daily.  30 tablet  1  . vitamin B-12 1000 MCG tablet Take 1 tablet (1,000 mcg total) by mouth daily.  30 tablet  1  . Vitamin D, Ergocalciferol, (DRISDOL) 50000 UNITS CAPS Take 1 capsule (50,000 Units total) by mouth every 7 (seven) days.  12 capsule  0   No current facility-administered medications for this visit.    There is no immunization history on file for this patient.  History  Substance Use Topics  . Smoking status: Never Smoker   . Smokeless tobacco: Not on file  . Alcohol Use: 8.4 oz/week    14 Cans of beer per week     Comment: daily    Family History  Problem Relation Age of Onset  . Stroke Mother     Vital signs: BP 132/80  Pulse 88  Temp(Src) 99 F (37.2 C)  Resp 18  Ht 5\' 10"  (1.778 m)  Wt 140 lb (63.504 kg)  BMI 20.09 kg/m2  SpO2 94% Physical Exam  Constitutional: She appears well-developed. No distress.  HENT:  Head: Normocephalic and atraumatic.  Right Ear: External ear normal.  Left Ear: External ear normal.  Nose: Nose normal.  Mouth/Throat: Oropharynx is clear and moist. No oropharyngeal exudate.  Eyes: EOM are normal. Pupils are equal, round, and reactive to light.  Neck: Normal range of motion. Neck supple. No JVD present. No tracheal deviation  present. No thyromegaly present.  Cardiovascular: Normal rate, regular rhythm, normal heart sounds and intact distal pulses.   Pulmonary/Chest: Effort normal and breath sounds normal. No respiratory distress.  Abdominal: Soft. Bowel sounds are normal. She exhibits no distension and no mass. There is no tenderness.  Musculoskeletal:  Left hip surgical site w/o erythema, warmth or drainage, only mild tenderness;  Also has tenderness on left foot b/w 2nd and 3rd digits with warmth, swelling present  Neurological: She is alert.  Oriented to person and place, not time  Psychiatric: Her affect is blunt. Cognition  and memory are impaired. She exhibits abnormal recent memory.  Does not speak much unless asked questions    Functional assessment: Areas of potential improvement: Rehabilitation Potential: Prognosis for survival: Plan: Gout Appears to possibly be having an acute flare of her gout.  Xrays and uric acid levels have been ordered previously and are pending results.  Alcohol abuse Has likely contributed to her dementia along with her stroke.  No signs of withdrawal any longer.  Will not be able to drink while here.   Osteoporosis with fracture Was started on vitamin D 16109 units weekly on 5/8.  Continue this.  Would f/u level in 8-12 weeks.  Also would benefit from bisphosphonate therapy.  With her GERD, h/o alcohol and esophageal stricture, I would favor reclast or prolia rather than po bisphosphonate.  Postoperative anemia due to acute blood loss F/u cbc.  Is on b12 replacement due to h/o deficiency.    Atrial fibrillation Has been paroxysmal.  Is currently on lovenox dvt prophylaxis.  No on coumadin therapy or asa.  In NSR when seen.  Reactive depression (situational) On lorazepam alone at this point.  May benefit from an SSRI long term to help decrease use of this.    Hip fracture, left S/p fall.  Here for short term rehab after fall with fx.  Had repair 12/17/12 and to complete  course of dvt prophylaxis with lovenox.  Pain is controlled at this time.  Able to participate in therapy.  Fall precautions in place.    Vitamin D deficiency Continue repletion and f/u level  Other and unspecified hyperlipidemia Not currently on meds for this.  Triglycerides were very high due to alcohol abuse.  Will repeat levels after she is off alcohol and see how they are.  Meds for lipids could adversely affect her liver in combo with alcohol if she is to return home.

## 2012-12-25 NOTE — Clinical Social Work Placement (Signed)
Clinical Social Work Department  CLINICAL SOCIAL WORK PLACEMENT NOTE  12/17/12  Patient:  Account Number:   Admit date:12/16/14 Clinical Social Worker: Sabino Niemann MSW Date/time: 12/17/1409:30 AM  Clinical Social Work is seeking post-discharge placement for this patient at the following level of care: SKILLED NURSING (*CSW will update this form in Epic as items are completed)  5/6/14Patient/family provided with [facility] Department of Clinical Social Work's list of facilities offering this level of care within the geographic area requested by the patient (or if unable, by the patient's family).  12/17/12 Patient/family informed of their freedom to choose among providers that offer the needed level of care, that participate in Medicare, Medicaid or managed care program needed by the patient, have an available bed and are willing to accept the patient.  5/6/14Patient/family informed of [facility]' ownership interest in [facility], as well as of the fact that they are under no obligation to receive care at this facility.  PASARR submitted to EDS on pre-existing PASARR number received from EDS on  FL2 transmitted to all facilities in geographic area requested by pt/family on 12/17/12 FL2 transmitted to all facilities within larger geographic area on  Patient informed that his/her managed care company has contracts with or will negotiate with certain facilities, including the following:  Patient/family informed of bed offers received: 12/18/12 Patient chooses bed at [facility] Physician recommends and patient chooses bed at  Patient to be transferred to on 12/20/12 Patient to be transferred to facility by [facility] The following physician request were entered in Epic:  Additional Comments:

## 2012-12-25 NOTE — Clinical Social Work Psychosocial (Addendum)
Clinical Social Work Department  BRIEF PSYCHOSOCIAL ASSESSMENT  Patient: [name]  Account Number: [id number] Admit date: 12/15/12 Clinical Social Worker Sabino Niemann, MSW Date/Time: 12/17/12 Referred by: Physician Date Referred:  Referred for   SNF Placement   Other Referral:  Interview type: Patient  Other interview type: PSYCHOSOCIAL DATA  Living Status: Alone Admitted from facility:  Level of care:  Primary support name: [name],[name] Primary support relationship to patient: Friend Degree of support available:  Poor  CURRENT CONCERNS  Current Concerns   Post-Acute Placement   Other Concerns:  SOCIAL WORK ASSESSMENT / PLAN  CSW met with pt re: PT recommendation for SNF.   Pt lives alone  CSW explained placement process and answered questions.   Pt reports [facility] as her preference    CSW completed FL2 and initiated SNF search.     Assessment/plan status: Information/Referral to [facility]  Other assessment/ plan:  Information/referral to community resources:  SNF     PATIENT'S/FAMILY'S RESPONSE TO PLAN OF CARE:  Pt  reports she is agreeable to ST SNF in order to increase strength and independence with mobility prior to returning home  Pt verbalized understanding of placement process and appreciation for CSW assist.   Sabino Niemann, MSW [phone]

## 2013-01-03 DIAGNOSIS — M25579 Pain in unspecified ankle and joints of unspecified foot: Secondary | ICD-10-CM | POA: Diagnosis not present

## 2013-01-08 ENCOUNTER — Non-Acute Institutional Stay (SKILLED_NURSING_FACILITY): Payer: Medicare Other | Admitting: Internal Medicine

## 2013-01-08 DIAGNOSIS — M19079 Primary osteoarthritis, unspecified ankle and foot: Secondary | ICD-10-CM

## 2013-01-08 NOTE — Progress Notes (Signed)
Patient ID: [name], female   DOB: [birth year], 72 y.o.   MRN: [id number]  [facility]  Chief Complaint: left foot and ankle pain and swelling HPI:  72 yo female with h/o alcohol abuse, dementia and aphasia s/p stroke here for short term rehab s/p last hospitalization for fall with left hip fracture.  She has had problems with left foot and ankle pain and swelling.  Has had workup including xray now showing moderate OA. Reviewed these results with staff and patient.  Has h/o gout but atypical location for this and no erythema.  Review of Systems:  Review of Systems  Constitutional: Negative for fever.  Eyes: Negative for blurred vision.  Respiratory: Negative for shortness of breath.   Cardiovascular: Negative for chest pain.  Gastrointestinal: Negative for heartburn.  Genitourinary: Negative for dysuria.  Musculoskeletal: Positive for joint pain and falls.  Skin: Negative for rash.  Neurological: Negative for dizziness and headaches.  Psychiatric/Behavioral: Positive for memory loss and substance abuse.     Medications: Patient's Medications  New Prescriptions   No medications on file  Previous Medications   ACETAMINOPHEN (TYLENOL) 325 MG TABLET    Take 2 tablets (650 mg total) by mouth every 6 (six) hours as needed.   BISACODYL (DULCOLAX) 10 MG SUPPOSITORY    Place 1 suppository (10 mg total) rectally daily as needed.   CHOLECALCIFEROL (VITAMIN D) 1000 UNITS TABLET    Take 2 tablets (2,000 Units total) by mouth daily.   DOCUSATE SODIUM 100 MG CAPS    Take 100 mg by mouth 2 (two) times daily.   ENOXAPARIN (LOVENOX) 40 MG/0.4ML INJECTION    Inject 0.4 mLs (40 mg total) into the skin daily.   HYDROCODONE-ACETAMINOPHEN (NORCO/VICODIN) 5-325 MG PER TABLET    Take 1 tablet by mouth every 4 (four) hours as needed.   HYDROCODONE-ACETAMINOPHEN (NORCO/VICODIN) 5-325 MG PER TABLET    Take 2 tablets by mouth every 4 (four) hours as needed.   LORAZEPAM (ATIVAN) 1 MG TABLET     Take 1 tablet (1 mg total) by mouth every 6 (six) hours as needed for anxiety (Anxiety/tremor).   MULTIPLE VITAMIN (MULTIVITAMIN WITH MINERALS) TABS    Take 1 tablet by mouth daily.   PANTOPRAZOLE (PROTONIX) 40 MG TABLET    Take 1 tablet (40 mg total) by mouth daily.   POLYETHYLENE GLYCOL (MIRALAX / GLYCOLAX) PACKET    Take 17 g by mouth daily.   THIAMINE 100 MG TABLET    Take 1 tablet (100 mg total) by mouth daily.   VITAMIN B-12 1000 MCG TABLET    Take 1 tablet (1,000 mcg total) by mouth daily.   VITAMIN D, ERGOCALCIFEROL, (DRISDOL) 50000 UNITS CAPS    Take 1 capsule (50,000 Units total) by mouth every 7 (seven) days.  Modified Medications   No medications on file  Discontinued Medications   No medications on file     Physical Exam: Physical Exam  Nursing note and vitals reviewed. Constitutional: No distress.  Thin white female  Cardiovascular: Normal rate, regular rhythm, normal heart sounds and intact distal pulses.   Pulmonary/Chest: Effort normal and breath sounds normal. No respiratory distress.  Abdominal: Soft. Bowel sounds are normal. She exhibits no distension. There is no tenderness.  Musculoskeletal: Normal range of motion. She exhibits edema and tenderness.  Left foot in arch and into toes with slight ecchymotic appearance  Neurological: She is alert.  Some aphasia  Skin: Skin is warm and dry.  Labs reviewed: Basic Metabolic Panel:  Recent Labs  16/10/96 1140 12/16/12 0038 12/16/12 0521 12/18/12 0552 12/18/12 1419 12/19/12 0500  NA 133* 135  --  134*  --  137  K 4.1 4.1  --  4.3  --  3.8  CL 96 100  --  100  --  104  CO2 24  --   --  26  --  27  GLUCOSE 170* 140*  --  134*  --  131*  BUN 5* 4*  --  7  --  7  CREATININE 0.61 0.80 0.54 0.79  --  0.67  CALCIUM 8.9  --   --  7.9* 8.1* 8.0*    Liver Function Tests:  Recent Labs  12/17/12 0640  AST 26  ALT 23  ALKPHOS 98  BILITOT 1.1  PROT 6.6  ALBUMIN 3.2*    CBC:  Recent Labs   07/16/12 1140  12/16/12 0521 12/18/12 0552 12/19/12 0500  WBC 9.8  < > 10.7* 12.4* 11.4*  NEUTROABS 7.3  --   --   --  8.8*  HGB 16.1*  < > 12.2 10.7* 9.4*  HCT 49.1*  < > 36.9 31.6* 28.1*  MCV 98.8  < > 95.1 94.6 94.3  PLT 240  < > 216 191 170  < > = values in this interval not displayed.  Significant Diagnostic Results: Left foot xray:  Moderate osteoarthritis  Assessment/Plan 1. Osteoarthritis, foot, localized, left Would use tylenol for pain while here If she will not be using alcohol again, this should also be ok at home if <2 grams per day Heat, ice, topical analgesia may also help Check uric acid level due to location near great toe now as of today's exam though bruised rather than inflammatory appearance present suggesting injury that we are not aware happened and she does not recall  Family/ staff Communication: discussed results with nurse and patient

## 2013-01-13 DIAGNOSIS — S92309A Fracture of unspecified metatarsal bone(s), unspecified foot, initial encounter for closed fracture: Secondary | ICD-10-CM | POA: Diagnosis not present

## 2013-01-13 DIAGNOSIS — S72033B Displaced midcervical fracture of unspecified femur, initial encounter for open fracture type I or II: Secondary | ICD-10-CM | POA: Diagnosis not present

## 2013-01-22 ENCOUNTER — Non-Acute Institutional Stay (SKILLED_NURSING_FACILITY): Payer: Medicare Other | Admitting: Internal Medicine

## 2013-01-22 DIAGNOSIS — Z79899 Other long term (current) drug therapy: Secondary | ICD-10-CM

## 2013-01-22 DIAGNOSIS — S72009S Fracture of unspecified part of neck of unspecified femur, sequela: Secondary | ICD-10-CM

## 2013-01-22 DIAGNOSIS — Z7901 Long term (current) use of anticoagulants: Secondary | ICD-10-CM

## 2013-01-22 DIAGNOSIS — S72002S Fracture of unspecified part of neck of left femur, sequela: Secondary | ICD-10-CM

## 2013-01-22 DIAGNOSIS — Z299 Encounter for prophylactic measures, unspecified: Secondary | ICD-10-CM

## 2013-01-22 NOTE — Progress Notes (Signed)
Patient ID: [name], female   DOB: 03-19-1941, 72 y.o.   MRN: [id number]  Chief Complaint:  Nursing question if lovenox still needed HPI:  72 yo female here for short term rehab s/p hospitalization for fall with hip fracture on 12/15/12.  She was to complete a 3 wk course of anticoagulation with lovenox (or until she was mobile).  She is working well with therapy especially since improvement in her foot pain.  She is spending more than 1/2 of her day active.  Review of Systems:  Review of Systems  Constitutional: Negative for fever and chills.  HENT: Negative for congestion.   Respiratory: Negative for shortness of breath.   Cardiovascular: Negative for chest pain.       No leg swelling  Gastrointestinal: Negative for abdominal pain.  Genitourinary: Negative for dysuria.  Musculoskeletal: Positive for falls.  Skin: Negative for rash.  Neurological: Negative for loss of consciousness.  Endo/Heme/Allergies: Bruises/bleeds easily.  Psychiatric/Behavioral: Positive for memory loss.    Medications: Patient's Medications  New Prescriptions   No medications on file  Previous Medications   ACETAMINOPHEN (TYLENOL) 325 MG TABLET    Take 2 tablets (650 mg total) by mouth every 6 (six) hours as needed.   BISACODYL (DULCOLAX) 10 MG SUPPOSITORY    Place 1 suppository (10 mg total) rectally daily as needed.   CHOLECALCIFEROL (VITAMIN D) 1000 UNITS TABLET    Take 2 tablets (2,000 Units total) by mouth daily.   DOCUSATE SODIUM 100 MG CAPS    Take 100 mg by mouth 2 (two) times daily.   ENOXAPARIN (LOVENOX) 40 MG/0.4ML INJECTION    Inject 0.4 mLs (40 mg total) into the skin daily.   HYDROCODONE-ACETAMINOPHEN (NORCO/VICODIN) 5-325 MG PER TABLET    Take 1 tablet by mouth every 4 (four) hours as needed.   HYDROCODONE-ACETAMINOPHEN (NORCO/VICODIN) 5-325 MG PER TABLET    Take 2 tablets by mouth every 4 (four) hours as needed.   LORAZEPAM (ATIVAN) 1 MG TABLET    Take 1 tablet (1 mg total) by mouth every  6 (six) hours as needed for anxiety (Anxiety/tremor).   MULTIPLE VITAMIN (MULTIVITAMIN WITH MINERALS) TABS    Take 1 tablet by mouth daily.   PANTOPRAZOLE (PROTONIX) 40 MG TABLET    Take 1 tablet (40 mg total) by mouth daily.   POLYETHYLENE GLYCOL (MIRALAX / GLYCOLAX) PACKET    Take 17 g by mouth daily.   THIAMINE 100 MG TABLET    Take 1 tablet (100 mg total) by mouth daily.   VITAMIN B-12 1000 MCG TABLET    Take 1 tablet (1,000 mcg total) by mouth daily.   VITAMIN D, ERGOCALCIFEROL, (DRISDOL) 50000 UNITS CAPS    Take 1 capsule (50,000 Units total) by mouth every 7 (seven) days.  Modified Medications   No medications on file  Discontinued Medications   No medications on file     Physical Exam: There were no vitals filed for this visit. Physical Exam  Constitutional: No distress.  HENT:  Head: Normocephalic and atraumatic.  Cardiovascular: Normal rate, regular rhythm, normal heart sounds and intact distal pulses.   Pulmonary/Chest: Effort normal and breath sounds normal. No respiratory distress.  Abdominal: Soft. Bowel sounds are normal. She exhibits no distension. There is no tenderness.  Musculoskeletal: Normal range of motion. She exhibits no edema and no tenderness.  Neurological: She is alert.  Skin: Skin is warm and dry.      Labs reviewed: Basic Metabolic Panel:  Recent Labs  07/16/12 1140 12/16/12 0038 12/16/12 0521 12/18/12 0552 12/18/12 1419 12/19/12 0500  NA 133* 135  --  134*  --  137  K 4.1 4.1  --  4.3  --  3.8  CL 96 100  --  100  --  104  CO2 24  --   --  26  --  27  GLUCOSE 170* 140*  --  134*  --  131*  BUN 5* 4*  --  7  --  7  CREATININE 0.61 0.80 0.54 0.79  --  0.67  CALCIUM 8.9  --   --  7.9* 8.1* 8.0*    Liver Function Tests:  Recent Labs  12/17/12 0640  AST 26  ALT 23  ALKPHOS 98  BILITOT 1.1  PROT 6.6  ALBUMIN 3.2*    CBC:  Recent Labs  07/16/12 1140  12/16/12 0521 12/18/12 0552 12/19/12 0500  WBC 9.8  < > 10.7* 12.4*  11.4*  NEUTROABS 7.3  --   --   --  8.8*  HGB 16.1*  < > 12.2 10.7* 9.4*  HCT 49.1*  < > 36.9 31.6* 28.1*  MCV 98.8  < > 95.1 94.6 94.3  PLT 240  < > 216 191 170  < > = values in this interval not displayed.  Assessment/Plan 1. Hip fracture, left, sequela -working well with therapy now that gout in foot being treated -active to where she no longer requires 2.  2. DVT prophylaxis -no longer requires this due to level of activity and duration of therapy has exceeded three weeks which is the recommended post-hip fx duration of anticoagulation for dvt prophylaxis

## 2013-02-10 ENCOUNTER — Encounter: Payer: Self-pay | Admitting: Internal Medicine

## 2013-02-10 DIAGNOSIS — S72033B Displaced midcervical fracture of unspecified femur, initial encounter for open fracture type I or II: Secondary | ICD-10-CM | POA: Diagnosis not present

## 2013-02-10 DIAGNOSIS — S92309A Fracture of unspecified metatarsal bone(s), unspecified foot, initial encounter for closed fracture: Secondary | ICD-10-CM | POA: Diagnosis not present

## 2013-02-10 NOTE — Assessment & Plan Note (Signed)
Has likely contributed to her dementia along with her stroke.  No signs of withdrawal any longer.  Will not be able to drink while here.

## 2013-02-10 NOTE — Assessment & Plan Note (Addendum)
On lorazepam alone at this point.  May benefit from an SSRI long term to help decrease use of this.

## 2013-02-10 NOTE — Assessment & Plan Note (Signed)
Appears to possibly be having an acute flare of her gout.  Xrays and uric acid levels have been ordered previously and are pending results.

## 2013-02-10 NOTE — Assessment & Plan Note (Signed)
F/u cbc.  Is on b12 replacement due to h/o deficiency.

## 2013-02-10 NOTE — Assessment & Plan Note (Addendum)
Has been paroxysmal.  Is currently on lovenox dvt prophylaxis.  No on coumadin therapy or asa.  In NSR when seen.

## 2013-02-10 NOTE — Assessment & Plan Note (Signed)
S/p fall.  Here for short term rehab after fall with fx.  Had repair 12/17/12 and to complete course of dvt prophylaxis with lovenox.  Pain is controlled at this time.  Able to participate in therapy.  Fall precautions in place.

## 2013-02-10 NOTE — Assessment & Plan Note (Signed)
Continue repletion and f/u level

## 2013-02-10 NOTE — Assessment & Plan Note (Signed)
Not currently on meds for this.  Triglycerides were very high due to alcohol abuse.  Will repeat levels after she is off alcohol and see how they are.  Meds for lipids could adversely affect her liver in combo with alcohol if she is to return home.

## 2013-02-10 NOTE — Assessment & Plan Note (Addendum)
Was started on vitamin D 98119 units weekly on 5/8.  Continue this.  Would f/u level in 8-12 weeks.  Also would benefit from bisphosphonate therapy.  With her GERD, h/o alcohol and esophageal stricture, I would favor reclast or prolia rather than po bisphosphonate.

## 2013-02-12 ENCOUNTER — Non-Acute Institutional Stay (SKILLED_NURSING_FACILITY): Payer: Medicare Other | Admitting: Internal Medicine

## 2013-02-12 DIAGNOSIS — F1010 Alcohol abuse, uncomplicated: Secondary | ICD-10-CM

## 2013-02-12 DIAGNOSIS — T50905S Adverse effect of unspecified drugs, medicaments and biological substances, sequela: Secondary | ICD-10-CM

## 2013-02-12 DIAGNOSIS — S72002S Fracture of unspecified part of neck of left femur, sequela: Secondary | ICD-10-CM

## 2013-02-12 DIAGNOSIS — IMO0002 Reserved for concepts with insufficient information to code with codable children: Secondary | ICD-10-CM

## 2013-02-12 DIAGNOSIS — M8080XS Other osteoporosis with current pathological fracture, unspecified site, sequela: Secondary | ICD-10-CM

## 2013-02-12 DIAGNOSIS — I10 Essential (primary) hypertension: Secondary | ICD-10-CM

## 2013-02-12 DIAGNOSIS — S72009S Fracture of unspecified part of neck of unspecified femur, sequela: Secondary | ICD-10-CM

## 2013-02-12 DIAGNOSIS — K219 Gastro-esophageal reflux disease without esophagitis: Secondary | ICD-10-CM

## 2013-02-12 DIAGNOSIS — E559 Vitamin D deficiency, unspecified: Secondary | ICD-10-CM

## 2013-02-12 MED ORDER — DSS 100 MG PO CAPS: 100.0000 mg | ORAL_CAPSULE | Freq: Two times a day (BID) | ORAL | Status: DC

## 2013-02-12 MED ORDER — VITAMIN D (ERGOCALCIFEROL) 1.25 MG (50000 UNIT) PO CAPS: 50000.0000 [IU] | ORAL_CAPSULE | ORAL | Status: DC

## 2013-02-12 MED ORDER — OMEPRAZOLE 40 MG PO CPDR: 40.0000 mg | DELAYED_RELEASE_CAPSULE | Freq: Every day | ORAL | Status: DC

## 2013-02-12 MED ORDER — CYANOCOBALAMIN 1000 MCG PO TABS: 1000.0000 ug | ORAL_TABLET | Freq: Every day | ORAL | Status: DC

## 2013-02-12 MED ORDER — THIAMINE HCL 100 MG PO TABS: 100.0000 mg | ORAL_TABLET | Freq: Every day | ORAL | Status: DC

## 2013-02-12 MED ORDER — LORAZEPAM 0.5 MG PO TABS: 0.5000 mg | ORAL_TABLET | Freq: Four times a day (QID) | ORAL | Status: DC | PRN

## 2013-02-12 MED ORDER — POLYETHYLENE GLYCOL 3350 17 G PO PACK: 17.0000 g | PACK | Freq: Every day | ORAL | Status: DC

## 2013-02-12 MED ORDER — ALENDRONATE SODIUM 70 MG PO TABS: 70.0000 mg | ORAL_TABLET | ORAL | Status: DC

## 2013-02-12 MED ORDER — BISACODYL 10 MG RE SUPP: 10.0000 mg | Freq: Every day | RECTAL | Status: DC | PRN

## 2013-02-12 MED ORDER — HYDROCODONE-ACETAMINOPHEN 5-325 MG PO TABS: 1.0000 | ORAL_TABLET | ORAL | Status: DC | PRN

## 2013-02-12 NOTE — Progress Notes (Signed)
Patient ID: [name], female   DOB: [birth year], 72 y.o.   MRN: [id number] Location:   [facility] Ode[facility] Mcelwain L. Renato Gails, D.O., C.M.D.  PCP: Pearla Dubonnet, MD  Allergies  Allergen Reactions  . Ambien (Zolpidem Tartrate)     Causes nightmares    Chief Complaint: discharge home  HPI:  72 yo female here for STR s/p left hip fracture.  See admission h&p for details.  Review of Systems:  Review of Systems  Constitutional: Negative for fever.  HENT: Negative for congestion.   Eyes: Negative for blurred vision.  Respiratory: Negative for shortness of breath.   Cardiovascular: Negative for chest pain.  Gastrointestinal: Negative for heartburn and abdominal pain.  Genitourinary: Negative for dysuria.  Musculoskeletal: Positive for falls. Negative for joint pain and myalgias.  Skin: Negative for rash.  Neurological: Negative for dizziness.  Psychiatric/Behavioral: Positive for memory loss.     Past Medical History  Diagnosis Date  . Hypertension   . Gout   . Closed left hip fracture 5/14  . Alcohol abuse   . GERD (gastroesophageal reflux disease)   . Vitamin D deficiency   . Vitamin B12 deficiency   . Depression   . Cerebrovascular accident (stroke) 2/07    left frontal lobe  . Dupuytren's contracture   . Bacterial vaginosis   . Hyperlipidemia LDL goal <70   . Fibrocystic breast disease   . Paroxysmal a-fib   . Renal calculus   . Fracture of metatarsal bone of right foot     5th metatarsal    Past Surgical History  Procedure Laterality Date  . Leg surgery    . Wrist surgery      left wrist  . Hip arthroplasty Left 12/17/2012    Procedure: LEFT HIP HEMI ARTHROPLASTY ;  Surgeon: Budd Palmer, MD;  Location: [facility] OR;  Service: Orthopedics;  Laterality: Left;    Social History:   reports that she has never smoked. She does not have any smokeless tobacco history on file. She reports that she drinks about 8.4 ounces of alcohol per week. She reports  that she does not use illicit drugs.  Family History  Problem Relation Age of Onset  . Stroke Mother     Medications: Patient's Medications  New Prescriptions   No medications on file  Previous Medications   ACETAMINOPHEN (TYLENOL) 325 MG TABLET    Take 2 tablets (650 mg total) by mouth every 6 (six) hours as needed.   BISACODYL (DULCOLAX) 10 MG SUPPOSITORY    Place 1 suppository (10 mg total) rectally daily as needed.   CHOLECALCIFEROL (VITAMIN D) 1000 UNITS TABLET    Take 2 tablets (2,000 Units total) by mouth daily.   DOCUSATE SODIUM 100 MG CAPS    Take 100 mg by mouth 2 (two) times daily.   ENOXAPARIN (LOVENOX) 40 MG/0.4ML INJECTION    Inject 0.4 mLs (40 mg total) into the skin daily.   HYDROCODONE-ACETAMINOPHEN (NORCO/VICODIN) 5-325 MG PER TABLET    Take 1 tablet by mouth every 4 (four) hours as needed.   HYDROCODONE-ACETAMINOPHEN (NORCO/VICODIN) 5-325 MG PER TABLET    Take 2 tablets by mouth every 4 (four) hours as needed.   LORAZEPAM (ATIVAN) 1 MG TABLET    Take 1 tablet (1 mg total) by mouth every 6 (six) hours as needed for anxiety (Anxiety/tremor).   MULTIPLE VITAMIN (MULTIVITAMIN WITH MINERALS) TABS    Take 1 tablet by mouth daily.   PANTOPRAZOLE (PROTONIX) 40 MG TABLET  Take 1 tablet (40 mg total) by mouth daily.   POLYETHYLENE GLYCOL (MIRALAX / GLYCOLAX) PACKET    Take 17 g by mouth daily.   THIAMINE 100 MG TABLET    Take 1 tablet (100 mg total) by mouth daily.   VITAMIN B-12 1000 MCG TABLET    Take 1 tablet (1,000 mcg total) by mouth daily.   VITAMIN D, ERGOCALCIFEROL, (DRISDOL) 50000 UNITS CAPS    Take 1 capsule (50,000 Units total) by mouth every 7 (seven) days.  Modified Medications   No medications on file  Discontinued Medications   No medications on file   Physical Exam: There were no vitals filed for this visit. Physical Exam  Constitutional: No distress.  HENT:  Head: Normocephalic and atraumatic.  Right Ear: External ear normal.  Left Ear: External ear  normal.  Nose: Nose normal.  Mouth/Throat: Oropharynx is clear and moist. No oropharyngeal exudate.  Eyes: EOM are normal. Pupils are equal, round, and reactive to light.  Neck: Normal range of motion. Neck supple.  Cardiovascular: Normal rate, regular rhythm, normal heart sounds and intact distal pulses.   Pulmonary/Chest: Effort normal and breath sounds normal. No respiratory distress.  Abdominal: Soft. Bowel sounds are normal. She exhibits no distension. There is no tenderness.  Musculoskeletal: Normal range of motion. She exhibits no edema and no tenderness.  Neurological: She is alert.  Skin: Skin is warm and dry.     Labs reviewed: Basic Metabolic Panel:  Recent Labs  19/14/78 1140 12/16/12 0038 12/16/12 0521 12/18/12 0552 12/18/12 1419 12/19/12 0500  NA 133* 135  --  134*  --  137  K 4.1 4.1  --  4.3  --  3.8  CL 96 100  --  100  --  104  CO2 24  --   --  26  --  27  GLUCOSE 170* 140*  --  134*  --  131*  BUN 5* 4*  --  7  --  7  CREATININE 0.61 0.80 0.54 0.79  --  0.67  CALCIUM 8.9  --   --  7.9* 8.1* 8.0*   Liver Function Tests:  Recent Labs  12/17/12 0640  AST 26  ALT 23  ALKPHOS 98  BILITOT 1.1  PROT 6.6  ALBUMIN 3.2*  CBC:  Recent Labs  07/16/12 1140  12/16/12 0521 12/18/12 0552 12/19/12 0500  WBC 9.8  < > 10.7* 12.4* 11.4*  NEUTROABS 7.3  --   --   --  8.8*  HGB 16.1*  < > 12.2 10.7* 9.4*  HCT 49.1*  < > 36.9 31.6* 28.1*  MCV 98.8  < > 95.1 94.6 94.3  PLT 240  < > 216 191 170  < > = values in this interval not displayed. CBG:  Recent Labs  12/17/12 0612  GLUCAP 129*   Assessment/Plan:   1. Hip fracture, left, sequela -f/u with Dr. Carola Frost - alendronate (FOSAMAX) 70 MG tablet; Take 1 tablet (70 mg total) by mouth every 7 (seven) days. Take with a full glass of water on an empty stomach.  Dispense: 4 tablet; Refill: 0 - HYDROcodone-acetaminophen (NORCO/VICODIN) 5-325 MG per tablet; Take 1 tablet by mouth every 4 (four) hours as needed.   Dispense: 30 tablet; Refill: 0 - Vitamin D, Ergocalciferol, (DRISDOL) 50000 UNITS CAPS; Take 1 capsule (50,000 Units total) by mouth every 7 (seven) days.  Dispense: 4 capsule; Refill: 0  2. Osteoporosis with fracture, sequela - alendronate (FOSAMAX) 70 MG tablet; Take 1  tablet (70 mg total) by mouth every 7 (seven) days. Take with a full glass of water on an empty stomach.  Dispense: 4 tablet; Refill: 0 - Vitamin D, Ergocalciferol, (DRISDOL) 50000 UNITS CAPS; Take 1 capsule (50,000 Units total) by mouth every 7 (seven) days.  Dispense: 4 capsule; Refill: 0  3. Alcohol abuse -encouraged pt to avoid alcohol and attend AA groups, does have family support - cyanocobalamin 1000 MCG tablet; Take 1 tablet (1,000 mcg total) by mouth daily.  Dispense: 30 tablet; Refill: 0 - LORazepam (ATIVAN) 0.5 MG tablet; Take 1 tablet (0.5 mg total) by mouth every 6 (six) hours as needed for anxiety (Anxiety/tremor).  Dispense: 30 tablet; Refill: 0 - thiamine 100 MG tablet; Take 1 tablet (100 mg total) by mouth daily.  Dispense: 30 tablet; Refill: 0  4. Vitamin D deficiency - alendronate (FOSAMAX) 70 MG tablet; Take 1 tablet (70 mg total) by mouth every 7 (seven) days. Take with a full glass of water on an empty stomach.  Dispense: 4 tablet; Refill: 0 - Vitamin D, Ergocalciferol, (DRISDOL) 50000 UNITS CAPS; Take 1 capsule (50,000 Units total) by mouth every 7 (seven) days.  Dispense: 4 capsule; Refill: 0  5. GERD (gastroesophageal reflux disease) - omeprazole (PRILOSEC) 40 MG capsule; Take 1 capsule (40 mg total) by mouth daily.  Dispense: 30 capsule; Refill: 0  6. Constipation due to pain medication, sequela - bisacodyl (DULCOLAX) 10 MG suppository; Place 1 suppository (10 mg total) rectally daily as needed.  Dispense: 12 suppository; Refill: 0 - Docusate Sodium (DSS) 100 MG CAPS; Take 100 mg by mouth 2 (two) times daily.  Dispense: 60 each; Refill: 0 - polyethylene glycol (MIRALAX / GLYCOLAX) packet; Take 17 g by  mouth daily.  Dispense: 30 each; Refill: 0  Patient is being discharged with home health services:  OT, RN   Patient is being discharged with the following durable medical equipment:  3-in-1 bedside commode, shower bench, reacher, sock aid, long handled shoe horn, walker bag  Patient has been advised to f/u with their PCP in 1-2 weeks to bring them up to date on their rehab stay.  They were provided with a 30 day supply of scripts for prescription medications and refills must be obtained from their PCP.  Also to follow up with Dr. Carola Frost re: her hip.

## 2013-02-15 DIAGNOSIS — R269 Unspecified abnormalities of gait and mobility: Secondary | ICD-10-CM | POA: Diagnosis not present

## 2013-02-15 DIAGNOSIS — Z471 Aftercare following joint replacement surgery: Secondary | ICD-10-CM | POA: Diagnosis not present

## 2013-02-15 DIAGNOSIS — I10 Essential (primary) hypertension: Secondary | ICD-10-CM | POA: Diagnosis not present

## 2013-02-15 DIAGNOSIS — IMO0001 Reserved for inherently not codable concepts without codable children: Secondary | ICD-10-CM | POA: Diagnosis not present

## 2013-03-22 ENCOUNTER — Encounter: Payer: Self-pay | Admitting: Internal Medicine

## 2013-04-16 DIAGNOSIS — M25569 Pain in unspecified knee: Secondary | ICD-10-CM | POA: Diagnosis not present

## 2013-04-16 DIAGNOSIS — IMO0001 Reserved for inherently not codable concepts without codable children: Secondary | ICD-10-CM | POA: Diagnosis not present

## 2013-04-16 DIAGNOSIS — S82009A Unspecified fracture of unspecified patella, initial encounter for closed fracture: Secondary | ICD-10-CM | POA: Diagnosis not present

## 2013-04-16 DIAGNOSIS — S72033B Displaced midcervical fracture of unspecified femur, initial encounter for open fracture type I or II: Secondary | ICD-10-CM | POA: Diagnosis not present

## 2013-06-04 DIAGNOSIS — IMO0002 Reserved for concepts with insufficient information to code with codable children: Secondary | ICD-10-CM | POA: Diagnosis not present

## 2013-06-04 DIAGNOSIS — S72033B Displaced midcervical fracture of unspecified femur, initial encounter for open fracture type I or II: Secondary | ICD-10-CM | POA: Diagnosis not present

## 2013-08-02 ENCOUNTER — Encounter: Payer: Self-pay | Admitting: Internal Medicine

## 2013-08-10 ENCOUNTER — Encounter: Payer: Self-pay | Admitting: Internal Medicine

## 2013-08-29 ENCOUNTER — Emergency Department (HOSPITAL_COMMUNITY)
Admission: EM | Admit: 2013-08-29 | Discharge: 2013-08-29 | Disposition: A | Discharge status: Home / Self Care | Payer: Medicare Other | Attending: Emergency Medicine | Admitting: Emergency Medicine

## 2013-08-29 ENCOUNTER — Emergency Department (HOSPITAL_COMMUNITY): Payer: Medicare Other

## 2013-08-29 ENCOUNTER — Encounter (HOSPITAL_COMMUNITY): Payer: Self-pay | Admitting: Emergency Medicine

## 2013-08-29 DIAGNOSIS — J111 Influenza due to unidentified influenza virus with other respiratory manifestations: Secondary | ICD-10-CM

## 2013-08-29 DIAGNOSIS — Z8739 Personal history of other diseases of the musculoskeletal system and connective tissue: Secondary | ICD-10-CM | POA: Diagnosis not present

## 2013-08-29 DIAGNOSIS — Z8781 Personal history of (healed) traumatic fracture: Secondary | ICD-10-CM | POA: Diagnosis not present

## 2013-08-29 DIAGNOSIS — K219 Gastro-esophageal reflux disease without esophagitis: Secondary | ICD-10-CM | POA: Insufficient documentation

## 2013-08-29 DIAGNOSIS — E538 Deficiency of other specified B group vitamins: Secondary | ICD-10-CM | POA: Insufficient documentation

## 2013-08-29 DIAGNOSIS — Z87440 Personal history of urinary (tract) infections: Secondary | ICD-10-CM | POA: Diagnosis not present

## 2013-08-29 DIAGNOSIS — E559 Vitamin D deficiency, unspecified: Secondary | ICD-10-CM | POA: Diagnosis not present

## 2013-08-29 DIAGNOSIS — Z79899 Other long term (current) drug therapy: Secondary | ICD-10-CM | POA: Insufficient documentation

## 2013-08-29 DIAGNOSIS — Z8742 Personal history of other diseases of the female genital tract: Secondary | ICD-10-CM | POA: Diagnosis not present

## 2013-08-29 DIAGNOSIS — Z7983 Long term (current) use of bisphosphonates: Secondary | ICD-10-CM | POA: Diagnosis not present

## 2013-08-29 DIAGNOSIS — Z87442 Personal history of urinary calculi: Secondary | ICD-10-CM | POA: Diagnosis not present

## 2013-08-29 DIAGNOSIS — F329 Major depressive disorder, single episode, unspecified: Secondary | ICD-10-CM | POA: Diagnosis not present

## 2013-08-29 DIAGNOSIS — E785 Hyperlipidemia, unspecified: Secondary | ICD-10-CM | POA: Diagnosis not present

## 2013-08-29 DIAGNOSIS — F3289 Other specified depressive episodes: Secondary | ICD-10-CM | POA: Insufficient documentation

## 2013-08-29 DIAGNOSIS — R059 Cough, unspecified: Secondary | ICD-10-CM | POA: Diagnosis not present

## 2013-08-29 DIAGNOSIS — Z8673 Personal history of transient ischemic attack (TIA), and cerebral infarction without residual deficits: Secondary | ICD-10-CM | POA: Insufficient documentation

## 2013-08-29 DIAGNOSIS — I10 Essential (primary) hypertension: Secondary | ICD-10-CM | POA: Diagnosis not present

## 2013-08-29 DIAGNOSIS — R05 Cough: Secondary | ICD-10-CM | POA: Diagnosis not present

## 2013-08-29 DIAGNOSIS — R0602 Shortness of breath: Secondary | ICD-10-CM | POA: Diagnosis not present

## 2013-08-29 NOTE — ED Provider Notes (Signed)
CSN: [id number]     Arrival date & time 08/29/13  1751 History  This chart was scribed for non-physician practitioner, Hyman Bible, PA-C working with Shaune Pollack, MD by Einar Pheasant, ED scribe. This patient was seen in room TR05C/TR05C and the patient's care was started at 8:15 PM.    Chief Complaint  Patient presents with  . Cough    The history is provided by the patient. No language interpreter was used.   HPI Comments: [name] is a 73 y.o. female who presents to the Emergency Department complaining of gradual onset, worsening productive cough with yellow sputum that started 3 days ago. Pt states that when she closes her eyes and tries to open them again they feel like her eye lids are "stuck together". She also reports discharge from her eye that is yellow in color.  Denies eye pain or vision changes.  Pt is also complaining of sore throat, generalized myalgias, fever with TMAX of 102 as of 1 day ago. Patient also reports that yesterday she had a mild headache, but no headache at this time.  Current ED temp is 98.1. Pt reports taking Advil and cough syrup to relieve her symptoms, with minimal relief. She denies any visual changes, SOB, nausea, emesis, or abdominal pain. Pt did not get a flu vaccine this season.  No history of COPD or Asthma.  PCP is Dr. Josetta Huddle Past Medical History  Diagnosis Date  . Hypertension   . Gout   . Closed left hip fracture 5/14  . Alcohol abuse   . GERD (gastroesophageal reflux disease)   . Vitamin D deficiency   . Vitamin B12 deficiency   . Depression   . Cerebrovascular accident (stroke) 2/07    left frontal lobe  . Dupuytren's contracture   . Bacterial vaginosis   . Hyperlipidemia LDL goal <70   . Fibrocystic breast disease   . Paroxysmal a-fib   . Renal calculus   . Fracture of metatarsal bone of right foot     5th metatarsal   Past Surgical History  Procedure Laterality Date  . Leg surgery    . Wrist surgery      left  wrist  . Hip arthroplasty Left 12/17/2012    Procedure: LEFT HIP HEMI ARTHROPLASTY ;  Surgeon: Rozanna Box, MD;  Location: [facility];  Service: Orthopedics;  Laterality: Left;   Family History  Problem Relation Age of Onset  . Stroke Mother    History  Substance Use Topics  . Smoking status: Never Smoker   . Smokeless tobacco: Not on file  . Alcohol Use: 8.4 oz/week    14 Cans of beer per week     Comment: daily   OB History   Grav Para Term Preterm Abortions TAB SAB Ect Mult Living                 Review of Systems  Constitutional: Positive for fever. Negative for chills.  HENT: Positive for sore throat.   Eyes: Positive for discharge. Negative for visual disturbance.  Respiratory: Positive for cough.   Gastrointestinal: Negative for nausea, vomiting and abdominal pain.  Musculoskeletal: Positive for myalgias.    Allergies  Ambien  Home Medications   Current Outpatient Rx  Name  Route  Sig  Dispense  Refill  . acetaminophen (TYLENOL) 325 MG tablet   Oral   Take 2 tablets (650 mg total) by mouth every 6 (six) hours as needed.   30 tablet  1   . alendronate (FOSAMAX) 70 MG tablet   Oral   Take 1 tablet (70 mg total) by mouth every 7 (seven) days. Take with a full glass of water on an empty stomach.   4 tablet   0   . bisacodyl (DULCOLAX) 10 MG suppository   Rectal   Place 1 suppository (10 mg total) rectally daily as needed.   12 suppository   0   . cholecalciferol (VITAMIN D) 1000 UNITS tablet   Oral   Take 2 tablets (2,000 Units total) by mouth daily.   30 tablet   1   . cyanocobalamin 1000 MCG tablet   Oral   Take 1 tablet (1,000 mcg total) by mouth daily.   30 tablet   0   . Docusate Sodium (DSS) 100 MG CAPS   Oral   Take 100 mg by mouth 2 (two) times daily.   60 each   0   . HYDROcodone-acetaminophen (NORCO/VICODIN) 5-325 MG per tablet   Oral   Take 1 tablet by mouth every 4 (four) hours as needed.   30 tablet   0   . LORazepam  (ATIVAN) 0.5 MG tablet   Oral   Take 1 tablet (0.5 mg total) by mouth every 6 (six) hours as needed for anxiety (Anxiety/tremor).   30 tablet   0   . Multiple Vitamin (MULTIVITAMIN WITH MINERALS) TABS   Oral   Take 1 tablet by mouth daily.   30 tablet   1   . omeprazole (PRILOSEC) 40 MG capsule   Oral   Take 1 capsule (40 mg total) by mouth daily.   30 capsule   0   . polyethylene glycol (MIRALAX / GLYCOLAX) packet   Oral   Take 17 g by mouth daily.   30 each   0   . thiamine 100 MG tablet   Oral   Take 1 tablet (100 mg total) by mouth daily.   30 tablet   0   . Vitamin D, Ergocalciferol, (DRISDOL) 50000 UNITS CAPS   Oral   Take 1 capsule (50,000 Units total) by mouth every 7 (seven) days.   4 capsule   0    BP 181/94  Pulse 97  Temp(Src) 98.1 F (36.7 C) (Oral)  Resp 16  Wt 130 lb 8 oz (59.194 kg)  SpO2 97%  Physical Exam  Nursing note and vitals reviewed. Constitutional: She appears well-developed and well-nourished. No distress.  HENT:  Head: Normocephalic and atraumatic.  Right Ear: Tympanic membrane and ear canal normal.  Left Ear: Tympanic membrane and ear canal normal.  Mouth/Throat: Oropharynx is clear and moist. No oropharyngeal exudate, posterior oropharyngeal edema or posterior oropharyngeal erythema.  Eyes: Right eye exhibits no discharge. Left eye exhibits no discharge. Right conjunctiva is injected. Left conjunctiva is injected.  Neck: Normal range of motion. Neck supple.  Cardiovascular: Normal rate, regular rhythm and normal heart sounds.  Exam reveals no gallop and no friction rub.   No murmur heard. Pulmonary/Chest: Effort normal and breath sounds normal. No respiratory distress.  Abdominal: Soft. Bowel sounds are normal. She exhibits no distension. There is no tenderness.  Musculoskeletal: She exhibits no edema and no tenderness.  Neurological: She is alert.  Skin: Skin is warm and dry.  Psychiatric: She has a normal mood and affect.  Her behavior is normal. Thought content normal.    ED Course  Procedures (including critical care time)  DIAGNOSTIC STUDIES: Oxygen Saturation is 97% on RA,  adequate by my interpretation.    COORDINATION OF CARE: 8:21 PM- Advised pt of possible flu diagnosis. Will consult with Dr. Jeanell Sparrow before discharging. Pt advised of plan for treatment and pt agrees.  8:25 PM- Pt was re-assessed with Dr. Jeanell Sparrow and is ready for discharge.    Labs Review Labs Reviewed - No data to display Imaging Review Dg Chest 2 View  08/29/2013   CLINICAL DATA:  Cough, fever, shortness of breath  EXAM: CHEST  2 VIEW  COMPARISON:  12/15/2012  FINDINGS: Chronic interstitial markings/emphysematous changes. Biapical pleural parenchymal scarring. Left basilar scarring/ atelectasis. No focal consolidation. No pleural effusion or pneumothorax.  The heart is normal in size.  Degenerative changes of the visualized thoracolumbar spine.  IMPRESSION: No evidence of acute cardiopulmonary disease.   Electronically Signed   By: Julian Hy M.D.   On: 08/29/2013 18:43    EKG Interpretation   None       MDM  No diagnosis found. Patient presenting today with Influenza like symptoms.  Patient is not hypoxic.  Hemodynamically stable.  CXR negative.  Feel that the patient is stable for discharge.  Patient is past the 48 hour window for Tamiflu.  Return precautions given to the patient.  I personally performed the services described in this documentation, which was scribed in my presence. The recorded information has been reviewed and is accurate.    Hyman Bible, PA-C 08/30/13 1943

## 2013-08-29 NOTE — ED Notes (Signed)
Pt reports cold for 3 days; sts she has been febrile at home at 102.0.  Sts she is not running a fever at home.  Sts she has taken advil, cough syrup, cough drops and aspirin for symptoms at home.  Sts she is not feeling any better today.

## 2013-08-29 NOTE — ED Notes (Signed)
The pt has had a cold and a cough for 3-4 days with head congestion eyes watering and feeling bad

## 2013-08-29 NOTE — ED Notes (Signed)
Pt found in waiting room.  Wheeled back into FT5.

## 2013-08-31 NOTE — ED Provider Notes (Signed)
Medical screening examination/treatment/procedure(s) were performed by non-physician practitioner and as supervising physician I was immediately available for consultation/collaboration.  EKG Interpretation   None         Saddie Benders. Dorna Mai, MD 08/31/13 2014

## 2014-06-04 ENCOUNTER — Encounter (HOSPITAL_COMMUNITY): Payer: Self-pay | Admitting: Emergency Medicine

## 2014-06-04 ENCOUNTER — Emergency Department (HOSPITAL_COMMUNITY): Payer: Medicare Other

## 2014-06-04 ENCOUNTER — Inpatient Hospital Stay (HOSPITAL_COMMUNITY)
Admission: EM | Admit: 2014-06-04 | Discharge: 2014-06-06 | DRG: 312 | Disposition: A | Discharge status: Home / Self Care | Payer: Medicare Other | Attending: Internal Medicine | Admitting: Internal Medicine

## 2014-06-04 DIAGNOSIS — I639 Cerebral infarction, unspecified: Secondary | ICD-10-CM

## 2014-06-04 DIAGNOSIS — E785 Hyperlipidemia, unspecified: Secondary | ICD-10-CM

## 2014-06-04 DIAGNOSIS — E559 Vitamin D deficiency, unspecified: Secondary | ICD-10-CM | POA: Diagnosis present

## 2014-06-04 DIAGNOSIS — I6309 Cerebral infarction due to thrombosis of other precerebral artery: Secondary | ICD-10-CM

## 2014-06-04 DIAGNOSIS — Z6820 Body mass index (BMI) 20.0-20.9, adult: Secondary | ICD-10-CM

## 2014-06-04 DIAGNOSIS — R400 Somnolence: Secondary | ICD-10-CM | POA: Diagnosis not present

## 2014-06-04 DIAGNOSIS — F1010 Alcohol abuse, uncomplicated: Secondary | ICD-10-CM

## 2014-06-04 DIAGNOSIS — F10220 Alcohol dependence with intoxication, uncomplicated: Secondary | ICD-10-CM

## 2014-06-04 DIAGNOSIS — R296 Repeated falls: Secondary | ICD-10-CM | POA: Diagnosis present

## 2014-06-04 DIAGNOSIS — E538 Deficiency of other specified B group vitamins: Secondary | ICD-10-CM

## 2014-06-04 DIAGNOSIS — N390 Urinary tract infection, site not specified: Secondary | ICD-10-CM | POA: Diagnosis present

## 2014-06-04 DIAGNOSIS — R410 Disorientation, unspecified: Secondary | ICD-10-CM

## 2014-06-04 DIAGNOSIS — R55 Syncope and collapse: Secondary | ICD-10-CM | POA: Diagnosis not present

## 2014-06-04 DIAGNOSIS — F1020 Alcohol dependence, uncomplicated: Secondary | ICD-10-CM | POA: Diagnosis present

## 2014-06-04 DIAGNOSIS — S298XXA Other specified injuries of thorax, initial encounter: Secondary | ICD-10-CM | POA: Diagnosis not present

## 2014-06-04 DIAGNOSIS — I6349 Cerebral infarction due to embolism of other cerebral artery: Secondary | ICD-10-CM | POA: Diagnosis present

## 2014-06-04 DIAGNOSIS — I10 Essential (primary) hypertension: Secondary | ICD-10-CM | POA: Diagnosis present

## 2014-06-04 DIAGNOSIS — M720 Palmar fascial fibromatosis [Dupuytren]: Secondary | ICD-10-CM

## 2014-06-04 DIAGNOSIS — I480 Paroxysmal atrial fibrillation: Secondary | ICD-10-CM

## 2014-06-04 DIAGNOSIS — Z8673 Personal history of transient ischemic attack (TIA), and cerebral infarction without residual deficits: Secondary | ICD-10-CM

## 2014-06-04 DIAGNOSIS — M47812 Spondylosis without myelopathy or radiculopathy, cervical region: Secondary | ICD-10-CM | POA: Diagnosis not present

## 2014-06-04 DIAGNOSIS — E43 Unspecified severe protein-calorie malnutrition: Secondary | ICD-10-CM | POA: Diagnosis present

## 2014-06-04 DIAGNOSIS — R404 Transient alteration of awareness: Secondary | ICD-10-CM | POA: Diagnosis not present

## 2014-06-04 DIAGNOSIS — Z823 Family history of stroke: Secondary | ICD-10-CM

## 2014-06-04 DIAGNOSIS — Z96642 Presence of left artificial hip joint: Secondary | ICD-10-CM | POA: Diagnosis present

## 2014-06-04 LAB — COMPREHENSIVE METABOLIC PANEL
ALT: 12 U/L (ref 0–35)
ANION GAP: 15 (ref 5–15)
AST: 16 U/L (ref 0–37)
Albumin: 3.8 g/dL (ref 3.5–5.2)
Alkaline Phosphatase: 96 U/L (ref 39–117)
BUN: 8 mg/dL (ref 6–23)
CO2: 24 mEq/L (ref 19–32)
CREATININE: 0.67 mg/dL (ref 0.50–1.10)
Calcium: 9.1 mg/dL (ref 8.4–10.5)
Chloride: 94 mEq/L — ABNORMAL LOW (ref 96–112)
GFR calc non Af Amer: 84 mL/min — ABNORMAL LOW (ref >90)
Glucose, Bld: 116 mg/dL — ABNORMAL HIGH (ref 70–99)
Potassium: 4.6 mEq/L (ref 3.7–5.3)
Sodium: 133 mEq/L — ABNORMAL LOW (ref 137–147)
TOTAL PROTEIN: 7.6 g/dL (ref 6.0–8.3)
Total Bilirubin: 0.3 mg/dL (ref 0.3–1.2)

## 2014-06-04 LAB — URINALYSIS, ROUTINE W REFLEX MICROSCOPIC
Bilirubin Urine: NEGATIVE
Glucose, UA: NEGATIVE mg/dL
KETONES UR: NEGATIVE mg/dL
NITRITE: NEGATIVE
PH: 5 (ref 5.0–8.0)
Protein, ur: NEGATIVE mg/dL
SPECIFIC GRAVITY, URINE: 1.006 (ref 1.005–1.030)
Urobilinogen, UA: 0.2 mg/dL (ref 0.0–1.0)

## 2014-06-04 LAB — RAPID URINE DRUG SCREEN, HOSP PERFORMED
Amphetamines: NOT DETECTED
BARBITURATES: NOT DETECTED
Benzodiazepines: NOT DETECTED
Cocaine: NOT DETECTED
Opiates: NOT DETECTED
Tetrahydrocannabinol: NOT DETECTED

## 2014-06-04 LAB — MAGNESIUM: MAGNESIUM: 2 mg/dL (ref 1.5–2.5)

## 2014-06-04 LAB — URINE MICROSCOPIC-ADD ON

## 2014-06-04 LAB — ETHANOL: ALCOHOL ETHYL (B): 62 mg/dL — AB (ref 0–11)

## 2014-06-04 LAB — CBC WITH DIFFERENTIAL/PLATELET
Basophils Absolute: 0.1 10*3/uL (ref 0.0–0.1)
Basophils Relative: 1 % (ref 0–1)
EOS ABS: 0.1 10*3/uL (ref 0.0–0.7)
Eosinophils Relative: 2 % (ref 0–5)
HEMATOCRIT: 47.4 % — AB (ref 36.0–46.0)
HEMOGLOBIN: 16 g/dL — AB (ref 12.0–15.0)
Lymphocytes Relative: 25 % (ref 12–46)
Lymphs Abs: 1.6 10*3/uL (ref 0.7–4.0)
MCH: 30.8 pg (ref 26.0–34.0)
MCHC: 33.8 g/dL (ref 30.0–36.0)
MCV: 91.3 fL (ref 78.0–100.0)
MONO ABS: 0.4 10*3/uL (ref 0.1–1.0)
MONOS PCT: 5 % (ref 3–12)
NEUTROS PCT: 67 % (ref 43–77)
Neutro Abs: 4.3 10*3/uL (ref 1.7–7.7)
Platelets: 298 10*3/uL (ref 150–400)
RBC: 5.19 MIL/uL — ABNORMAL HIGH (ref 3.87–5.11)
RDW: 12.3 % (ref 11.5–15.5)
WBC: 6.5 10*3/uL (ref 4.0–10.5)

## 2014-06-04 LAB — APTT: aPTT: 30 seconds (ref 24–37)

## 2014-06-04 LAB — I-STAT CHEM 8, ED
BUN: 7 mg/dL (ref 6–23)
CALCIUM ION: 1.08 mmol/L — AB (ref 1.13–1.30)
Chloride: 100 mEq/L (ref 96–112)
Creatinine, Ser: 0.8 mg/dL (ref 0.50–1.10)
Glucose, Bld: 119 mg/dL — ABNORMAL HIGH (ref 70–99)
HEMATOCRIT: 52 % — AB (ref 36.0–46.0)
Hemoglobin: 17.7 g/dL — ABNORMAL HIGH (ref 12.0–15.0)
Potassium: 4.3 mEq/L (ref 3.7–5.3)
Sodium: 133 mEq/L — ABNORMAL LOW (ref 137–147)
TCO2: 22 mmol/L (ref 0–100)

## 2014-06-04 LAB — I-STAT TROPONIN, ED: TROPONIN I, POC: 0.01 ng/mL (ref 0.00–0.08)

## 2014-06-04 LAB — PHOSPHORUS: Phosphorus: 3 mg/dL (ref 2.3–4.6)

## 2014-06-04 LAB — PROTIME-INR
INR: 1 (ref 0.00–1.49)
PROTHROMBIN TIME: 13.3 s (ref 11.6–15.2)

## 2014-06-04 LAB — AMMONIA: AMMONIA: 21 umol/L (ref 11–60)

## 2014-06-04 MED ORDER — LORAZEPAM 1 MG PO TABS
0.0000 mg | ORAL_TABLET | Freq: Four times a day (QID) | ORAL | Status: DC
  Administered 2014-06-05 – 2014-06-06 (×2): 1 mg via ORAL
  Filled 2014-06-04: qty 1

## 2014-06-04 MED ORDER — ADULT MULTIVITAMIN W/MINERALS CH
1.0000 | ORAL_TABLET | Freq: Every day | ORAL | Status: DC
Start: 2014-06-04 — End: 2014-06-06
  Administered 2014-06-04 – 2014-06-06 (×3): 1 via ORAL
  Filled 2014-06-04 (×3): qty 1

## 2014-06-04 MED ORDER — PANTOPRAZOLE SODIUM 40 MG PO TBEC
40.0000 mg | DELAYED_RELEASE_TABLET | Freq: Every day | ORAL | Status: DC
  Administered 2014-06-04 – 2014-06-06 (×3): 40 mg via ORAL
  Filled 2014-06-04 (×3): qty 1

## 2014-06-04 MED ORDER — LORAZEPAM 2 MG/ML IJ SOLN: 1.0000 mg | Freq: Four times a day (QID) | INTRAMUSCULAR | Status: DC | PRN

## 2014-06-04 MED ORDER — ATENOLOL 25 MG PO TABS
50.0000 mg | ORAL_TABLET | Freq: Every day | ORAL | Status: DC
  Administered 2014-06-04 – 2014-06-05 (×2): 50 mg via ORAL
  Filled 2014-06-04 (×2): qty 2

## 2014-06-04 MED ORDER — LORAZEPAM 1 MG PO TABS
1.0000 mg | ORAL_TABLET | Freq: Four times a day (QID) | ORAL | Status: DC | PRN
  Administered 2014-06-04: 1 mg via ORAL
  Filled 2014-06-04 (×2): qty 1

## 2014-06-04 MED ORDER — DEXTROSE 5 % IV SOLN
1.0000 g | INTRAVENOUS | Status: DC
  Administered 2014-06-04 – 2014-06-05 (×2): 1 g via INTRAVENOUS
  Filled 2014-06-04 (×3): qty 10

## 2014-06-04 MED ORDER — VITAMIN D 1000 UNITS PO TABS
1000.0000 [IU] | ORAL_TABLET | Freq: Every day | ORAL | Status: DC
  Administered 2014-06-05 – 2014-06-06 (×2): 1000 [IU] via ORAL
  Filled 2014-06-04 (×3): qty 1

## 2014-06-04 MED ORDER — ASPIRIN 325 MG PO TABS
325.0000 mg | ORAL_TABLET | Freq: Every day | ORAL | Status: DC
  Administered 2014-06-04 – 2014-06-06 (×3): 325 mg via ORAL
  Filled 2014-06-04 (×3): qty 1

## 2014-06-04 MED ORDER — ASPIRIN 300 MG RE SUPP: 300.0000 mg | Freq: Every day | RECTAL | Status: DC

## 2014-06-04 MED ORDER — LABETALOL HCL 5 MG/ML IV SOLN: 5.0000 mg | INTRAVENOUS | Status: DC | PRN

## 2014-06-04 MED ORDER — ACETAMINOPHEN 80 MG PO CHEW
500.0000 mg | CHEWABLE_TABLET | Freq: Four times a day (QID) | ORAL | Status: DC | PRN
  Filled 2014-06-04: qty 7

## 2014-06-04 MED ORDER — FOLIC ACID 1 MG PO TABS
1.0000 mg | ORAL_TABLET | Freq: Every day | ORAL | Status: DC
  Administered 2014-06-04 – 2014-06-06 (×3): 1 mg via ORAL
  Filled 2014-06-04 (×3): qty 1

## 2014-06-04 MED ORDER — VITAMIN B-1 100 MG PO TABS
100.0000 mg | ORAL_TABLET | Freq: Every day | ORAL | Status: DC
  Administered 2014-06-04 – 2014-06-06 (×3): 100 mg via ORAL
  Filled 2014-06-04 (×3): qty 1

## 2014-06-04 MED ORDER — LORAZEPAM 1 MG PO TABS: 0.0000 mg | ORAL_TABLET | Freq: Two times a day (BID) | ORAL | Status: DC

## 2014-06-04 MED ORDER — THIAMINE HCL 100 MG/ML IJ SOLN
Freq: Once | INTRAVENOUS | Status: AC
  Administered 2014-06-04: 18:00:00 via INTRAVENOUS
  Filled 2014-06-04: qty 1000

## 2014-06-04 MED ORDER — ACETAMINOPHEN 325 MG PO TABS
650.0000 mg | ORAL_TABLET | Freq: Four times a day (QID) | ORAL | Status: DC | PRN
  Administered 2014-06-04 – 2014-06-05 (×3): 650 mg via ORAL
  Filled 2014-06-04 (×4): qty 2

## 2014-06-04 MED ORDER — ENOXAPARIN SODIUM 40 MG/0.4ML ~~LOC~~ SOLN
40.0000 mg | SUBCUTANEOUS | Status: DC
  Administered 2014-06-04 – 2014-06-05 (×2): 40 mg via SUBCUTANEOUS
  Filled 2014-06-04 (×2): qty 0.4

## 2014-06-04 MED ORDER — STROKE: EARLY STAGES OF RECOVERY BOOK
Freq: Once | Status: AC
  Administered 2014-06-05: 01:00:00
  Filled 2014-06-04 (×2): qty 1

## 2014-06-04 MED ORDER — OXYCODONE HCL 5 MG PO TABS: 5.0000 mg | ORAL_TABLET | ORAL | Status: DC | PRN

## 2014-06-04 MED ORDER — LABETALOL HCL 5 MG/ML IV SOLN
5.0000 mg | Freq: Once | INTRAVENOUS | Status: AC
  Administered 2014-06-04: 5 mg via INTRAVENOUS
  Filled 2014-06-04: qty 4

## 2014-06-04 MED ORDER — SENNOSIDES-DOCUSATE SODIUM 8.6-50 MG PO TABS: 1.0000 | ORAL_TABLET | Freq: Every evening | ORAL | Status: DC | PRN

## 2014-06-04 MED ORDER — THIAMINE HCL 100 MG/ML IJ SOLN: 100.0000 mg | Freq: Every day | INTRAMUSCULAR | Status: DC

## 2014-06-04 NOTE — ED Notes (Signed)
Spoke with family member (daughter-inlaw ) patient did not know the year or president. Patient family states this is not her baseline. Patient outside of code stroke window due to we do not know Last seen normal.

## 2014-06-04 NOTE — ED Notes (Signed)
Patient is now able to verbalize the year and president

## 2014-06-04 NOTE — ED Notes (Signed)
Per ems patient was at the hair salon and fell and LOC for about 1 minute EMS states patient  was only alert to self. Upon  assesment Patient is now alert to self and place. Patient is able to follow commands. EMS placed a 20L   Forearm. Vitals  150/80 H 80 o2 96 RA BGS 104

## 2014-06-04 NOTE — ED Notes (Signed)
Admitting MD aware of pt bp, to give labetolol every 2 hours.

## 2014-06-04 NOTE — ED Notes (Signed)
Patient denies pain but states she has some dizziness

## 2014-06-04 NOTE — ED Notes (Signed)
Patient daughter called and stated if any changes  Please call her or her husband at [phone] and her husband number Delfino Lovett [phone]. Patient gave verbal consent to give her daughter and son-in law medical information.

## 2014-06-04 NOTE — H&P (Addendum)
[facility]                                                                                    Patient Demographics  [name], is a 73 y.o. female  CSN: [id number]  MRN: [id number]  DOB - 1940/09/12  Admit Date - 06/04/2014  Outpatient Primary MD for the patient is Henrine Screws, MD   With History of -  Past Medical History  Diagnosis Date  . Hypertension   . Gout   . Closed left hip fracture 5/14  . Alcohol abuse   . GERD (gastroesophageal reflux disease)   . Vitamin D deficiency   . Vitamin B12 deficiency   . Depression   . Cerebrovascular accident (stroke) 2/07    left frontal lobe  . Dupuytren's contracture   . Bacterial vaginosis   . Hyperlipidemia LDL goal <70   . Fibrocystic breast disease   . Paroxysmal a-fib   . Renal calculus   . Fracture of metatarsal bone of right foot     5th metatarsal      Past Surgical History  Procedure Laterality Date  . Leg surgery    . Wrist surgery      left wrist  . Hip arthroplasty Left 12/17/2012    Procedure: LEFT HIP HEMI ARTHROPLASTY ;  Surgeon: Rozanna Box, MD;  Location: [place];  Service: Orthopedics;  Laterality: Left;    in for   Chief Complaint  Patient presents with  . Loss of Consciousness     HPI  [name]  is a 73 y.o. 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 She says that the shampoo smelled bad. No chest pain, shortness of breath , nausea , vomiting or diarrhea. Patient reports dizziness after the episode . She reports losing consciousness for around 5 minutes no seizure activity. Patient reports drinking 2 glasses of vodka with cranberry a day. She also reports multiple episodes of losing consciousness at home and one time she had a hip fracture after a fall status post arthroplasty. Patient is not on seizure medications. She  was tremulous and shaking in the ER and reports one drink of alcohol today. The patient denies any prodrome however she was confused after the episode with loss of urine or stools    Review of Systems    In addition to the HPI above,  No Fever-chills, No  changes with Vision or hearing, No problems swallowing food or Liquids, No Chest pain, Cough or Shortness of Breath, No Abdominal pain, No Nausea or Vommitting, Bowel movements are regular, No Blood in stool or Urine, No dysuria, No new skin rashes or bruises, No new joints pains-aches,  No new weakness, tingling, numbness in any extremity, No recent weight gain or loss, No polyuria, polydypsia or polyphagia, No significant Mental Stressors.  A full 10 point Review of Systems was done, except as stated above, all other Review of Systems were negative.   Social History History  Substance Use Topics  . Smoking status: Never Smoker   . Smokeless tobacco:  Not on file  . Alcohol Use: 8.4 oz/week    14 Cans of beer per week     Comment: daily     Family History Family History  Problem Relation Age of Onset  . Stroke Mother      Prior to Admission medications   Medication Sig Start Date End Date Taking? Authorizing Provider  cholecalciferol (VITAMIN D) 1000 UNITS tablet Take 1,000 Units by mouth daily.   Yes Historical Provider, MD  ibuprofen (ADVIL) 200 MG tablet Take 400 mg by mouth every 6 (six) hours as needed for fever, headache or mild pain.   Yes Historical Provider, MD    Allergies  Allergen Reactions  . Ambien [Zolpidem Tartrate]     Causes nightmares    Physical Exam  Vitals  Blood pressure 191/105, pulse 80, temperature 98.3 F (36.8 C), temperature source Oral, resp. rate 17, height 5\' 10"  (1.778 m), weight 63.504 kg (140 lb), SpO2 99.00%.   1. General well developed, well nourished female, tremulous  2. Normal affect and insight, Not Suicidal or Homicidal, Awake Alert, Oriented X 3.  3. No F.N  deficits, ALL C.Nerves Intact, Babinski's negative  4. Ears and Eyes appear Normal, Conjunctivae clear, PERRLA. Moist Oral Mucosa.  5. Supple Neck, No JVD, No cervical lymphadenopathy appriciated, No Carotid Bruits.  6. Symmetrical Chest wall movement, Good air movement bilaterally, CTAB.  7. RRR, No Gallops, Rubs or Murmurs, No Parasternal Heave.  8. Positive Bowel Sounds, Abdomen Soft, Non tender, No organomegaly appriciated,No rebound -guarding or rigidity.  9.  No Cyanosis, Normal Skin Turgor, No Skin Rash or Bruise.  10. Good muscle tone,  joints appear normal , no effusions, Normal ROM.  11. No Palpable Lymph Nodes in Neck or Axillae    Data Review  CBC  Recent Labs Lab 06/04/14 1631 06/04/14 1642  WBC 6.5  --   HGB 16.0* 17.7*  HCT 47.4* 52.0*  PLT 298  --   MCV 91.3  --   MCH 30.8  --   MCHC 33.8  --   RDW 12.3  --   LYMPHSABS 1.6  --   MONOABS 0.4  --   EOSABS 0.1  --   BASOSABS 0.1  --    ------------------------------------------------------------------------------------------------------------------  Chemistries   Recent Labs Lab 06/04/14 1631 06/04/14 1642  NA 133* 133*  K 4.6 4.3  CL 94* 100  CO2 24  --   GLUCOSE 116* 119*  BUN 8 7  CREATININE 0.67 0.80  CALCIUM 9.1  --   MG 2.0  --   AST 16  --   ALT 12  --   ALKPHOS 96  --   BILITOT 0.3  --    ------------------------------------------------------------------------------------------------------------------ estimated creatinine clearance is 61.8 ml/min (by C-G formula based on Cr of 0.8). ------------------------------------------------------------------------------------------------------------------ No results found for this basename: TSH, T4TOTAL, FREET3, T3FREE, THYROIDAB,  in the last 72 hours   Coagulation profile  Recent Labs Lab 06/04/14 1631  INR 1.00    ------------------------------------------------------------------------------------------------------------------- No results found for this basename: DDIMER,  in the last 72 hours -------------------------------------------------------------------------------------------------------------------  Cardiac Enzymes No results found for this basename: CK, CKMB, TROPONINI, MYOGLOBIN,  in the last 168 hours ------------------------------------------------------------------------------------------------------------------ No components found with this basename: POCBNP,    ---------------------------------------------------------------------------------------------------------------  Urinalysis    Component Value Date/Time   COLORURINE YELLOW 06/04/2014 [place] 06/04/2014 1723   LABSPEC 1.006 06/04/2014 1723   PHURINE 5.0 06/04/2014 [place] 06/04/2014 1723  HGBUR TRACE* 06/04/2014 1723   BILIRUBINUR NEGATIVE 06/04/2014 1723   KETONESUR NEGATIVE 06/04/2014 1723   PROTEINUR NEGATIVE 06/04/2014 1723   UROBILINOGEN 0.2 06/04/2014 1723   NITRITE NEGATIVE 06/04/2014 1723   LEUKOCYTESUR SMALL* 06/04/2014 1723    ----------------------------------------------------------------------------------------------------------------   Imaging results:   Dg Chest 2 View  06/04/2014   CLINICAL DATA:  Status post fall, lost consciousness for 1 min  EXAM: CHEST  2 VIEW  COMPARISON:  08/29/2013  FINDINGS: There is left basilar airspace disease likely reflecting scarring. There is no focal parenchymal opacity, pleural effusion, or pneumothorax. The heart and mediastinal contours are unremarkable.  The osseous structures are unremarkable.  IMPRESSION: No active cardiopulmonary disease.   Electronically Signed   By: Kathreen Devoid   On: 06/04/2014 16:52   Ct Head Wo Contrast  06/04/2014   CLINICAL DATA:  Loss of consciousness  EXAM: CT HEAD WITHOUT CONTRAST  CT CERVICAL  SPINE WITHOUT CONTRAST  TECHNIQUE: Multidetector CT imaging of the head and cervical spine was performed following the standard protocol without intravenous contrast. Multiplanar CT image reconstructions of the cervical spine were also generated.  COMPARISON:  04/19/2006  FINDINGS: CT HEAD FINDINGS  No evidence of parenchymal hemorrhage or extra-axial fluid collection. No mass lesion, mass effect, or midline shift.  No CT evidence of acute infarction.  Subcortical white matter and periventricular small vessel ischemic changes. Left caudate lacunar infarct. Intracranial atherosclerosis.  The visualized paranasal sinuses are essentially clear. The mastoid air cells are unopacified.  No evidence of calvarial fracture.  CT CERVICAL SPINE FINDINGS  Normal cervical lordosis.  No evidence of fracture dislocation. Vertebral body heights are maintained. Dens appears intact.  No prevertebral soft tissue swelling.  Mild multilevel degenerative changes.  Visualized thyroid is unremarkable.  Visualized lung apices are notable for biapical pleural parenchymal scarring.  IMPRESSION: No evidence of acute intracranial abnormality. Atrophy with small vessel ischemic changes and intracranial atherosclerosis.  No evidence of traumatic injury to the cervical spine. Mild multilevel degenerative changes.   Electronically Signed   By: Julian Hy M.D.   On: 06/04/2014 18:07   Ct Cervical Spine Wo Contrast  06/04/2014   CLINICAL DATA:  Loss of consciousness  EXAM: CT HEAD WITHOUT CONTRAST  CT CERVICAL SPINE WITHOUT CONTRAST  TECHNIQUE: Multidetector CT imaging of the head and cervical spine was performed following the standard protocol without intravenous contrast. Multiplanar CT image reconstructions of the cervical spine were also generated.  COMPARISON:  04/19/2006  FINDINGS: CT HEAD FINDINGS  No evidence of parenchymal hemorrhage or extra-axial fluid collection. No mass lesion, mass effect, or midline shift.  No CT evidence of  acute infarction.  Subcortical white matter and periventricular small vessel ischemic changes. Left caudate lacunar infarct. Intracranial atherosclerosis.  The visualized paranasal sinuses are essentially clear. The mastoid air cells are unopacified.  No evidence of calvarial fracture.  CT CERVICAL SPINE FINDINGS  Normal cervical lordosis.  No evidence of fracture dislocation. Vertebral body heights are maintained. Dens appears intact.  No prevertebral soft tissue swelling.  Mild multilevel degenerative changes.  Visualized thyroid is unremarkable.  Visualized lung apices are notable for biapical pleural parenchymal scarring.  IMPRESSION: No evidence of acute intracranial abnormality. Atrophy with small vessel ischemic changes and intracranial atherosclerosis.  No evidence of traumatic injury to the cervical spine. Mild multilevel degenerative changes.   Electronically Signed   By: Julian Hy M.D.   On: 06/04/2014 18:07    My personal review of EKG: Rhythm NSR, at  81 beats per minute with right bundle branch block    Assessment & Plan  1. Syncope/collapse , seems to be due to alcoholism/? alcohol related seizure    Place in telemetry    Neurochecks    Serial troponins    Check MRI/MRA    Patient has history of CVA    Consult neurology in a.m. if needed  2. History of alcohol intake. Patient reports drinking 2 shots a day    Thiamine/folate    Alcohol withdrawal protocol  3. Vitamin D deficiency  4. Urinary tract infection ; start Rocephin  5. Hypertension; uncontrolled , start Tenormin    Labetalol when necessary  6. History of paroxysmal atrial fib    Monitor    not on blood thinners   Start Tenormin       DVT Prophylaxis Lovenox  AM Labs Ordered, also please review Full Orders  Code Status full  Disposition Plan: Unknown  Time spent in minutes : 33 minutes  Condition GUARDED   @SIGNATURE @

## 2014-06-04 NOTE — ED Notes (Signed)
2 nd Attemped to give report

## 2014-06-04 NOTE — ED Notes (Signed)
Attempted to give report 

## 2014-06-04 NOTE — ED Notes (Signed)
Patient transported to X-ray 

## 2014-06-04 NOTE — ED Notes (Signed)
Patient denies any pain from fall  and no obvious head injury

## 2014-06-04 NOTE — ED Provider Notes (Signed)
CSN: [id number]     Arrival date & time 06/04/14  1548 History   First MD Initiated Contact with Patient 06/04/14 1551     Chief Complaint  Patient presents with  . Loss of Consciousness     (Consider location/radiation/quality/duration/timing/severity/associated sxs/prior Treatment) HPI  [name] is a 73 y.o. female with past medical history significant for hypertension, hyperlipidemia, alcohol abuse,  Vitamin D deficiency, vitamin B12 deficiency, CVA, paroxysmal A. Fib brought in by EMS for syncopal event in confusion. As per EMS and patient she was at the hair salon, the last thing she remembers she was getting her hair shampooed. There was no prodrome, no dizziness, chest pain, shortness of breath, change in vision. She remembers she was sitting in the chair. Alert and oriented to person place and month, she does not know the year or president. Patient drinks heavily, I have spoken to her son on the phone and he said that the disorientation is atypical but she drinks heavily daily, she's never had any seizures, DTs or other issues with withdrawal. He states that she drinks beer and liquor daily and she states that she had a small glass of vodka this morning. Patient has no complaints at this time, she denies headache, change in vision, dysarthria, ataxia, chest pain, shortness of breath, nausea vomiting, weakness, denies any illicit drug use.    Past Medical History  Diagnosis Date  . Hypertension   . Gout   . Closed left hip fracture 5/14  . Alcohol abuse   . GERD (gastroesophageal reflux disease)   . Vitamin D deficiency   . Vitamin B12 deficiency   . Depression   . Cerebrovascular accident (stroke) 2/07    left frontal lobe  . Dupuytren's contracture   . Bacterial vaginosis   . Hyperlipidemia LDL goal <70   . Fibrocystic breast disease   . Paroxysmal a-fib   . Renal calculus   . Fracture of metatarsal bone of right foot     5th metatarsal   Past Surgical History   Procedure Laterality Date  . Leg surgery    . Wrist surgery      left wrist  . Hip arthroplasty Left 12/17/2012    Procedure: LEFT HIP HEMI ARTHROPLASTY ;  Surgeon: Rozanna Box, MD;  Location: [place];  Service: Orthopedics;  Laterality: Left;   Family History  Problem Relation Age of Onset  . Stroke Mother    History  Substance Use Topics  . Smoking status: Never Smoker   . Smokeless tobacco: Not on file  . Alcohol Use: 8.4 oz/week    14 Cans of beer per week     Comment: daily   OB History   Grav Para Term Preterm Abortions TAB SAB Ect Mult Living                 Review of Systems  10 systems reviewed and found to be negative, except as noted in the HPI.   Allergies  Ambien  Home Medications   Prior to Admission medications   Medication Sig Start Date End Date Taking? Authorizing Provider  aspirin 325 MG tablet Take 325 mg by mouth every 4 (four) hours as needed for mild pain.    Historical Provider, MD  guaiFENesin (ROBITUSSIN) 100 MG/5ML liquid Take 200 mg by mouth 3 (three) times daily as needed for cough.    Historical Provider, MD  ibuprofen (ADVIL) 200 MG tablet Take 400 mg by mouth every 6 (six) hours  as needed for fever, headache or mild pain.    Historical Provider, MD   BP 179/100  Pulse 79  Temp(Src) 98.3 F (36.8 C) (Oral)  Resp 20  Ht 5\' 10"  (1.778 m)  Wt 140 lb (63.504 kg)  BMI 20.09 kg/m2  SpO2 100% Physical Exam  Nursing note and vitals reviewed. Constitutional: She appears well-developed. No distress.  Poorly nourished  HENT:  Head: Normocephalic and atraumatic.  Mild tongue fasciculations  No abrasions or contusions.   No hemotympanum, battle signs or raccoon's eyes  No crepitance or tenderness to palpation along the orbital rim.  EOMI intact with no pain or diplopia  No abnormal otorrhea or rhinorrhea. Nasal septum midline.  No intraoral trauma.      Eyes: Conjunctivae and EOM are normal. Pupils are equal, round, and  reactive to light.  Neck: Normal range of motion. Neck supple.  No midline C-spine  tenderness to palpation or step-offs appreciated. Patient has full range of motion without pain.   Cardiovascular: Normal rate, regular rhythm and intact distal pulses.   Pulmonary/Chest: Effort normal and breath sounds normal. No stridor. No respiratory distress. She has no wheezes. She has no rales. She exhibits no tenderness.  Abdominal: Soft. Bowel sounds are normal. She exhibits no distension and no mass. There is no tenderness. There is no rebound and no guarding.  Musculoskeletal: Normal range of motion.  Neurological: She is alert. No cranial nerve deficit. She exhibits normal muscle tone. Coordination normal.  Oriented to name, date of birth, no she's in the hospital, knows it is October; does not know the year or name of the president. Reports that her primary care doctor is Dr. Inda Merlin. Denies history of stroke which she has had.  II-Visual fields grossly intact. III/IV/VI-Extraocular movements intact.  Pupils reactive bilaterally. V/VII-Smile symmetric, equal eyebrow raise,  facial sensation intact VIII- Hearing grossly intact IX/X-Normal gag XI-bilateral shoulder shrug XII-midline tongue extension Motor: 5/5 bilaterally with normal tone and bulk Cerebellar: Normal finger-to-nose  and normal heel-to-shin test.   Romberg negative Ambulates with a coordinated gait   Skin: Skin is warm.  Psychiatric: She has a normal mood and affect.    ED Course  Procedures (including critical care time) Labs Review Labs Reviewed  ETHANOL - Abnormal; Notable for the following:    Alcohol, Ethyl (B) 62 (*)    All other components within normal limits  URINALYSIS, ROUTINE W REFLEX MICROSCOPIC - Abnormal; Notable for the following:    Hgb urine dipstick TRACE (*)    Leukocytes, UA SMALL (*)    All other components within normal limits  CBC WITH DIFFERENTIAL - Abnormal; Notable for the following:    RBC 5.19  (*)    Hemoglobin 16.0 (*)    HCT 47.4 (*)    All other components within normal limits  COMPREHENSIVE METABOLIC PANEL - Abnormal; Notable for the following:    Sodium 133 (*)    Chloride 94 (*)    Glucose, Bld 116 (*)    GFR calc non Af Amer 84 (*)    All other components within normal limits  URINE MICROSCOPIC-ADD ON - Abnormal; Notable for the following:    Casts GRANULAR CAST (*)    All other components within normal limits  I-STAT CHEM 8, ED - Abnormal; Notable for the following:    Sodium 133 (*)    Glucose, Bld 119 (*)    Calcium, Ion 1.08 (*)    Hemoglobin 17.7 (*)    HCT  52.0 (*)    All other components within normal limits  URINE RAPID DRUG SCREEN (HOSP PERFORMED)  APTT  PROTIME-INR  AMMONIA  MAGNESIUM  PHOSPHORUS  I-STAT TROPOININ, ED    Imaging Review Dg Chest 2 View  06/04/2014   CLINICAL DATA:  Status post fall, lost consciousness for 1 min  EXAM: CHEST  2 VIEW  COMPARISON:  08/29/2013  FINDINGS: There is left basilar airspace disease likely reflecting scarring. There is no focal parenchymal opacity, pleural effusion, or pneumothorax. The heart and mediastinal contours are unremarkable.  The osseous structures are unremarkable.  IMPRESSION: No active cardiopulmonary disease.   Electronically Signed   By: Kathreen Devoid   On: 06/04/2014 16:52   Ct Head Wo Contrast  06/04/2014   CLINICAL DATA:  Loss of consciousness  EXAM: CT HEAD WITHOUT CONTRAST  CT CERVICAL SPINE WITHOUT CONTRAST  TECHNIQUE: Multidetector CT imaging of the head and cervical spine was performed following the standard protocol without intravenous contrast. Multiplanar CT image reconstructions of the cervical spine were also generated.  COMPARISON:  04/19/2006  FINDINGS: CT HEAD FINDINGS  No evidence of parenchymal hemorrhage or extra-axial fluid collection. No mass lesion, mass effect, or midline shift.  No CT evidence of acute infarction.  Subcortical white matter and periventricular small vessel  ischemic changes. Left caudate lacunar infarct. Intracranial atherosclerosis.  The visualized paranasal sinuses are essentially clear. The mastoid air cells are unopacified.  No evidence of calvarial fracture.  CT CERVICAL SPINE FINDINGS  Normal cervical lordosis.  No evidence of fracture dislocation. Vertebral body heights are maintained. Dens appears intact.  No prevertebral soft tissue swelling.  Mild multilevel degenerative changes.  Visualized thyroid is unremarkable.  Visualized lung apices are notable for biapical pleural parenchymal scarring.  IMPRESSION: No evidence of acute intracranial abnormality. Atrophy with small vessel ischemic changes and intracranial atherosclerosis.  No evidence of traumatic injury to the cervical spine. Mild multilevel degenerative changes.   Electronically Signed   By: Julian Hy M.D.   On: 06/04/2014 18:07   Ct Cervical Spine Wo Contrast  06/04/2014   CLINICAL DATA:  Loss of consciousness  EXAM: CT HEAD WITHOUT CONTRAST  CT CERVICAL SPINE WITHOUT CONTRAST  TECHNIQUE: Multidetector CT imaging of the head and cervical spine was performed following the standard protocol without intravenous contrast. Multiplanar CT image reconstructions of the cervical spine were also generated.  COMPARISON:  04/19/2006  FINDINGS: CT HEAD FINDINGS  No evidence of parenchymal hemorrhage or extra-axial fluid collection. No mass lesion, mass effect, or midline shift.  No CT evidence of acute infarction.  Subcortical white matter and periventricular small vessel ischemic changes. Left caudate lacunar infarct. Intracranial atherosclerosis.  The visualized paranasal sinuses are essentially clear. The mastoid air cells are unopacified.  No evidence of calvarial fracture.  CT CERVICAL SPINE FINDINGS  Normal cervical lordosis.  No evidence of fracture dislocation. Vertebral body heights are maintained. Dens appears intact.  No prevertebral soft tissue swelling.  Mild multilevel degenerative  changes.  Visualized thyroid is unremarkable.  Visualized lung apices are notable for biapical pleural parenchymal scarring.  IMPRESSION: No evidence of acute intracranial abnormality. Atrophy with small vessel ischemic changes and intracranial atherosclerosis.  No evidence of traumatic injury to the cervical spine. Mild multilevel degenerative changes.   Electronically Signed   By: Julian Hy M.D.   On: 06/04/2014 18:07     EKG Interpretation   Date/Time:  Thursday June 04 2014 16:22:06 EDT Ventricular Rate:  81 PR Interval:  182  QRS Duration: 144 QT Interval:  427 QTC Calculation: 496 R Axis:   83 Text Interpretation:  Sinus rhythm Atrial premature complex Right bundle  branch block RBBB new compared to 1999. Confirmed by Regenia Skeeter  MD, [place]  [id number]) on 06/04/2014 5:34:49 PM      MDM   Final diagnoses:  Syncope  Confusion      Filed Vitals:   06/04/14 1738 06/04/14 1800 06/04/14 1815 06/04/14 1830  BP:  183/99 172/100 179/100  Pulse:  79 78 79  Temp: 98.3 F (36.8 C)     TempSrc:      Resp:  20 11 20   Height:      Weight:      SpO2:  97% 99% 100%    Medications  labetalol (NORMODYNE,TRANDATE) injection 5 mg (not administered)  sodium chloride 0.9 % 1,000 mL with thiamine 628 mg, folic acid 1 mg, multivitamins adult 10 mL infusion ( Intravenous New Bag/Given 06/04/14 1732)    [name] is a 73 y.o. female presenting with syncopal event and confusion. No signs of head trauma, neuro exam is nonfocal, patient lives alone and her son confirms this via phone. States that it is atypical for her to be confused and that she drinks heavily. This is not a code stroke, or last seen normal is unknown. Patient is a heavy drinker, she does not appear grossly intoxicated at this time. She does state that she had a glass of vodka this morning.  Patient's mental status has improved, she now knows the year and president. She will need to be admitted for syncope was no  prodrome, altered mental status now resolved, consider possible TIA versus intoxication.  This is a shared visit with the attending physician who personally evaluated the patient and agrees with the care plan.   Patient will be admitted to try a hospitalist Dr. Allean Found Amarian Botero, PA-C 06/05/14 0001

## 2014-06-05 ENCOUNTER — Encounter (HOSPITAL_COMMUNITY): Payer: Self-pay

## 2014-06-05 ENCOUNTER — Observation Stay (HOSPITAL_COMMUNITY): Payer: Medicare Other

## 2014-06-05 DIAGNOSIS — E43 Unspecified severe protein-calorie malnutrition: Secondary | ICD-10-CM | POA: Diagnosis present

## 2014-06-05 DIAGNOSIS — I6309 Cerebral infarction due to thrombosis of other precerebral artery: Secondary | ICD-10-CM | POA: Diagnosis not present

## 2014-06-05 DIAGNOSIS — Z8673 Personal history of transient ischemic attack (TIA), and cerebral infarction without residual deficits: Secondary | ICD-10-CM | POA: Diagnosis not present

## 2014-06-05 DIAGNOSIS — R55 Syncope and collapse: Principal | ICD-10-CM

## 2014-06-05 DIAGNOSIS — I638 Other cerebral infarction: Secondary | ICD-10-CM | POA: Diagnosis not present

## 2014-06-05 DIAGNOSIS — E785 Hyperlipidemia, unspecified: Secondary | ICD-10-CM

## 2014-06-05 DIAGNOSIS — Z823 Family history of stroke: Secondary | ICD-10-CM | POA: Diagnosis not present

## 2014-06-05 DIAGNOSIS — Z6820 Body mass index (BMI) 20.0-20.9, adult: Secondary | ICD-10-CM | POA: Diagnosis not present

## 2014-06-05 DIAGNOSIS — R296 Repeated falls: Secondary | ICD-10-CM | POA: Diagnosis present

## 2014-06-05 DIAGNOSIS — F1020 Alcohol dependence, uncomplicated: Secondary | ICD-10-CM | POA: Diagnosis present

## 2014-06-05 DIAGNOSIS — E559 Vitamin D deficiency, unspecified: Secondary | ICD-10-CM | POA: Diagnosis present

## 2014-06-05 DIAGNOSIS — I10 Essential (primary) hypertension: Secondary | ICD-10-CM | POA: Diagnosis present

## 2014-06-05 DIAGNOSIS — I639 Cerebral infarction, unspecified: Secondary | ICD-10-CM | POA: Diagnosis present

## 2014-06-05 DIAGNOSIS — I6349 Cerebral infarction due to embolism of other cerebral artery: Secondary | ICD-10-CM | POA: Diagnosis present

## 2014-06-05 DIAGNOSIS — F10220 Alcohol dependence with intoxication, uncomplicated: Secondary | ICD-10-CM

## 2014-06-05 DIAGNOSIS — Z96642 Presence of left artificial hip joint: Secondary | ICD-10-CM | POA: Diagnosis present

## 2014-06-05 DIAGNOSIS — N390 Urinary tract infection, site not specified: Secondary | ICD-10-CM | POA: Diagnosis present

## 2014-06-05 DIAGNOSIS — I480 Paroxysmal atrial fibrillation: Secondary | ICD-10-CM | POA: Diagnosis present

## 2014-06-05 DIAGNOSIS — I517 Cardiomegaly: Secondary | ICD-10-CM | POA: Diagnosis not present

## 2014-06-05 DIAGNOSIS — R410 Disorientation, unspecified: Secondary | ICD-10-CM | POA: Diagnosis present

## 2014-06-05 LAB — LIPID PANEL
CHOLESTEROL: 180 mg/dL (ref 0–200)
HDL: 41 mg/dL (ref >39)
LDL Cholesterol: 84 mg/dL (ref 0–99)
TRIGLYCERIDES: 275 mg/dL — AB (ref <150)
Total CHOL/HDL Ratio: 4.4 RATIO
VLDL: 55 mg/dL — ABNORMAL HIGH (ref 0–40)

## 2014-06-05 LAB — TROPONIN I: Troponin I: <0.3 ng/mL (ref <0.30)

## 2014-06-05 LAB — HEMOGLOBIN A1C
Hgb A1c MFr Bld: 6.3 % — ABNORMAL HIGH (ref <5.7)
Mean Plasma Glucose: 134 mg/dL — ABNORMAL HIGH (ref <117)

## 2014-06-05 MED ORDER — SIMVASTATIN 20 MG PO TABS
20.0000 mg | ORAL_TABLET | Freq: Every day | ORAL | Status: DC
  Administered 2014-06-05: 20 mg via ORAL
  Filled 2014-06-05: qty 1

## 2014-06-05 MED ORDER — LABETALOL HCL 5 MG/ML IV SOLN: 5.0000 mg | INTRAVENOUS | Status: DC | PRN

## 2014-06-05 MED ORDER — ATENOLOL 25 MG PO TABS
25.0000 mg | ORAL_TABLET | Freq: Every day | ORAL | Status: DC
  Administered 2014-06-06: 25 mg via ORAL
  Filled 2014-06-05: qty 1

## 2014-06-05 MED ORDER — BOOST PLUS PO LIQD
237.0000 mL | Freq: Two times a day (BID) | ORAL | Status: DC
  Administered 2014-06-05 – 2014-06-06 (×3): 237 mL via ORAL
  Filled 2014-06-05 (×5): qty 237

## 2014-06-05 NOTE — ED Provider Notes (Signed)
Medical screening examination/treatment/procedure(s) were conducted as a shared visit with non-physician practitioner(s) and myself.  I personally evaluated the patient during the encounter.   EKG Interpretation   Date/Time:  Thursday June 04 2014 16:22:06 EDT Ventricular Rate:  81 PR Interval:  182 QRS Duration: 144 QT Interval:  427 QTC Calculation: 496 R Axis:   83 Text Interpretation:  Sinus rhythm Atrial premature complex Right bundle  branch block RBBB new compared to 1999. Confirmed by Regenia Skeeter  MD, [place]  [phone]) on 06/04/2014 5:34:49 PM       Patient with syncope without prodrome. Transiently confused, but no seizure like activity. Will need admission for syncope workup vs TIA  Ephraim Hamburger, MD 06/05/14 [phone]

## 2014-06-05 NOTE — Evaluation (Signed)
Occupational Therapy Evaluation Patient Details Name: [name] MRN: [id number] DOB: 19-Jun-1941 Today's Date: 06/05/2014    History of Present Illness This 73 y.o. female admitted after syncopal episode while at hair [employer] washing her hair.   MRI of brain + for acute Rt. parietal infarct.  PMH: h/o syncopal episodes 3-4 x/month; ETOH dependence; CVA; hip fracture   Clinical Impression   Pt admitted with above. She demonstrates the below listed deficits and will benefit from continued OT to maximize safety and independence with BADLs.  Pt presents to OT with impaired balance which she reports as new, and oscillopsia with superior gaze. .  She also  has, what she reports is a chronic tremor Rt UE that does not impact her functionally.  Currently, she requires min A for LB ADLs due to impaired balance.  She states she thinks that her son may stay with her 1-2 wks at discharge, but she is uncertain.  She will need 24 hour assistance at discharge based on eval performance today.  She is receptive to SNF if son unable to provide necessary level of assist, and reports she has been to [facility] x 2.       Follow Up Recommendations  SNF    Equipment Recommendations  None recommended by OT    Recommendations for Other Services       Precautions / Restrictions Precautions Precautions: Fall      Mobility Bed Mobility Overal bed mobility: Independent                Transfers Overall transfer level: Needs assistance   Transfers: Sit to/from Stand;Stand Pivot Transfers Sit to Stand: Min guard Stand pivot transfers: Min guard       General transfer comment: min guard assist for balance    Balance Overall balance assessment: Needs assistance Sitting-balance support: Feet supported Sitting balance-Leahy Scale: Good     Standing balance support: No upper extremity supported Standing balance-Leahy Scale: Poor                              ADL Overall  ADL's : Needs assistance/impaired Eating/Feeding: Independent;Sitting;Bed level   Grooming: Wash/dry hands;Oral care;Wash/dry face;Min guard;Standing   Upper Body Bathing: Set up;Sitting   Lower Body Bathing: Sit to/from stand;Minimal assistance   Upper Body Dressing : Set up;Sitting   Lower Body Dressing: Minimal assistance;Sit to/from stand   Toilet Transfer: Min guard;Ambulation;Comfort height toilet   Toileting- Clothing Manipulation and Hygiene: Minimal assistance;Sit to/from stand       Functional mobility during ADLs: Minimal assistance General ADL Comments: Pt with staggering gait, which she reports is not her normal.  She requires min A for LB ADLs due to impaired balance.  Pt is slow to respond and to initiate, but question if this is her baseline.  No family is available to provide baseline info     Vision Eye Alignment: Within Functional Limits   Ocular Range of Motion: Restricted looking up Tracking/Visual Pursuits: Decreased smoothness of vertical tracking         Additional Comments: Pt with oscillopsia with superior gaze   Perception Perception Perception Tested?: Yes Spatial deficits: when ambulating, pt walks very close to obstacles on her left, but does not run into them.  ? if there may be a very mild component of Lt. inattention    Praxis Praxis Praxis tested?: Within functional limits    Pertinent Vitals/Pain Pain Assessment: No/denies pain  Hand Dominance Right   Extremity/Trunk Assessment Upper Extremity Assessment Upper Extremity Assessment: RUE deficits/detail RUE Deficits / Details: Pt with chronic tremor of Rt. hand.  She reports she has learned to compensate for it.  She has flexor contracture Rt  little finger  - ? Dupuytren's  LUE Deficits / Details: Pt with flexor contractures of digits 4 &5 on Lt hand - ? Dupuytren's    Lower Extremity Assessment Lower Extremity Assessment: Defer to PT evaluation   Cervical / Trunk  Assessment Cervical / Trunk Assessment: Normal   Communication Communication Communication: No difficulties   Cognition Arousal/Alertness: Awake/alert Behavior During Therapy: WFL for tasks assessed/performed Overall Cognitive Status: No family/caregiver present to determine baseline cognitive functioning                     General Comments       Exercises       Shoulder Instructions      Home Living Family/patient expects to be discharged to:: Skilled nursing facility Living Arrangements: Alone Available Help at Discharge: Family;Available 24 hours/day (son possibly going to stay with her, but is unsure) Type of Home: Apartment Home Access: Level entry     Home Layout: One level     Bathroom Shower/Tub: Tub/shower unit;Curtain Shower/tub characteristics: Curtain       Home Equipment: Environmental consultant - 2 wheels   Additional Comments: Pt reports she thinks her son may stay with her for 1-2 weeks at discharge, but she is uncertain.  If he does not she is receptive to SNF      Prior Functioning/Environment Level of Independence: Independent        Comments: Pt reports she was fully independent including driving and community activities.  She does endorse a fall in 6/14 which resulted in hip fracture. .    OT Diagnosis: Generalized weakness   OT Problem List: Decreased strength;Decreased activity tolerance;Impaired balance (sitting and/or standing)   OT Treatment/Interventions: Self-care/ADL training;DME and/or AE instruction;Therapeutic activities;Patient/family education;Balance training    OT Goals(Current goals can be found in the care plan section) Acute Rehab OT Goals Patient Stated Goal: to get better  OT Goal Formulation: With patient Time For Goal Achievement: 06/19/14 Potential to Achieve Goals: Good ADL Goals Pt Will Perform Grooming: with modified independence;standing Pt Will Perform Upper Body Bathing: with modified independence;sitting Pt Will  Perform Lower Body Bathing: with modified independence;sit to/from stand Pt Will Perform Upper Body Dressing: with modified independence;sitting Pt Will Perform Lower Body Dressing: with modified independence;sit to/from stand Pt Will Transfer to Toilet: with modified independence;ambulating;regular height toilet;grab bars Pt Will Perform Toileting - Clothing Manipulation and hygiene: with modified independence;sit to/from stand Pt Will Perform Tub/Shower Transfer: Tub transfer;ambulating;shower seat  OT Frequency: Min 2X/week   Barriers to D/C: Decreased caregiver support          Co-evaluation              End of Session Nurse Communication: Mobility status  Activity Tolerance: Patient tolerated treatment well Patient left: in bed;with call bell/phone within reach;with nursing/sitter in room   Time: 6294-7654 OT Time Calculation (min): 21 min Charges:  OT General Charges $OT Visit: 1 Procedure OT Evaluation $Initial OT Evaluation Tier I: 1 Procedure OT Treatments $Therapeutic Activity: 8-22 mins G-Codes:    Lizzete Gough M 2014/06/26, 2:59 PM

## 2014-06-05 NOTE — Progress Notes (Signed)
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 No seizure activity reported. She complained of dizziness and lightheadedness prior to symptoms. She has no recollection of the event. She states that she has had 3-4 such episodes over the last year. She also gives history of sustaining couple of falls this year preceded by dizziness and lightheadedness without LOC. She consumes a shot of vodka twice a day and drinks a quart of beer daily.   Assessment/Plan:  1. Syncope: Possibly recurrent. Unclear etiology. DD-alcohol intoxication, orthostatic hypotension, R/o Seizures and arrythmia's. Not sure if the small acute right parietal white matter infarct would explain her syncope. PT and OT evaluation. No arrhythmias on monitor. Follow EEG results. 2. Small acute right parietal white matter infarct: Incidentally seen on MRI brain. Likely secondary to small vessel disease. Complete stroke workup-2-D echo, carotid Dopplers, A1c, fasting lipids, PT and OT evaluation. Passed bedside swallow screen by RN. Not on antiplatelets prior to admission. Start aspirin 325 mg daily. Although history of PAF, likely not candidate for anticoagulants do to history of alcohol abuse and recurrent falls. No TPA due to unknown time of onset and no focal signs. 3. Alcohol dependence: Cessation counseled. Continue Ativan protocol. 4. Presumed UTI: Continue IV Rocephin pending culture results. 5. Hypertension: Allow for permissive hypertension given new CVA. Continue atenolol.  6. Frequent falls: Alcohol abstinence counseled. PT and OT evaluation. 7. History of PAF: Currently in sinus bradycardia. 8. History of hyperlipidemia: Start statins. Goal LDL <70. LDL  84    Code Status: Full  Family Communication: None at bedside  Disposition Plan: Home when medically stable   Consultants:  None  Procedures:  None  Antibiotics:  IV Rocephin   Subjective: Denies complaints.  Objective: Filed Vitals:   06/04/14 2129 06/04/14 2302 06/05/14 0835 06/05/14 1218  BP: 174/91 136/69 112/57 155/72  Pulse: 81 84  56  Temp: 98.8 F (37.1 C)  97.7 F (36.5 C) 97.7 F (36.5 C)  TempSrc: Oral  Oral Oral  Resp: 18 16  16   Height:      Weight:      SpO2: 100% 98% 99% 98%   No intake or output data in the 24 hours ending 06/05/14 1411 Filed Weights   06/04/14 1602  Weight: 63.504 kg (140 lb)     Exam:  General exam: Pleasant elderly female lying comfortably in bed.  Respiratory system: Clear. No increased work of breathing. Cardiovascular system: S1 & S2 heard, RRR. No JVD, murmurs, gallops, clicks or pedal edema.Telemetry: Sinus bradycardia in the 50s.  Gastrointestinal system: Abdomen is nondistended, soft and nontender. Normal bowel sounds heard. Central nervous system: Alert and oriented. No focal neurological deficits. Extremities: Symmetric 5 x 5 power.   Data Reviewed: Basic Metabolic Panel:  Recent Labs Lab 06/04/14 1631 06/04/14 1642  NA 133* 133*  K 4.6 4.3  CL 94* 100  CO2 24  --   GLUCOSE 116* 119*  BUN 8 7  CREATININE 0.67 0.80  CALCIUM 9.1  --   MG 2.0  --   PHOS 3.0  --    Liver Function Tests:  Recent Labs Lab 06/04/14 1631  AST 16  ALT 12  ALKPHOS 96  BILITOT 0.3  PROT 7.6  ALBUMIN 3.8   No results  found for this basename: LIPASE, AMYLASE,  in the last 168 hours  Recent Labs Lab 06/04/14 1631  AMMONIA 21   CBC:  Recent Labs Lab 06/04/14 1631 06/04/14 1642  WBC 6.5  --   NEUTROABS 4.3  --   HGB 16.0* 17.7*  HCT 47.4* 52.0*  MCV 91.3  --   PLT 298  --    Cardiac Enzymes:  Recent Labs Lab 06/04/14 2300 06/05/14 0318  TROPONINI <0.30 <0.30   BNP (last 3 results) No  results found for this basename: PROBNP,  in the last 8760 hours CBG: No results found for this basename: GLUCAP,  in the last 168 hours  No results found for this or any previous visit (from the past 240 hour(s)).    Additional labs: 1. Hemoglobin A1c: 6.3 2. Fasting lipids: LDL 84     Studies: Dg Chest 2 View  06/04/2014   CLINICAL DATA:  Status post fall, lost consciousness for 1 min  EXAM: CHEST  2 VIEW  COMPARISON:  08/29/2013  FINDINGS: There is left basilar airspace disease likely reflecting scarring. There is no focal parenchymal opacity, pleural effusion, or pneumothorax. The heart and mediastinal contours are unremarkable.  The osseous structures are unremarkable.  IMPRESSION: No active cardiopulmonary disease.   Electronically Signed   By: Kathreen Devoid   On: 06/04/2014 16:52   Ct Head Wo Contrast  06/04/2014   CLINICAL DATA:  Loss of consciousness  EXAM: CT HEAD WITHOUT CONTRAST  CT CERVICAL SPINE WITHOUT CONTRAST  TECHNIQUE: Multidetector CT imaging of the head and cervical spine was performed following the standard protocol without intravenous contrast. Multiplanar CT image reconstructions of the cervical spine were also generated.  COMPARISON:  04/19/2006  FINDINGS: CT HEAD FINDINGS  No evidence of parenchymal hemorrhage or extra-axial fluid collection. No mass lesion, mass effect, or midline shift.  No CT evidence of acute infarction.  Subcortical white matter and periventricular small vessel ischemic changes. Left caudate lacunar infarct. Intracranial atherosclerosis.  The visualized paranasal sinuses are essentially clear. The mastoid air cells are unopacified.  No evidence of calvarial fracture.  CT CERVICAL SPINE FINDINGS  Normal cervical lordosis.  No evidence of fracture dislocation. Vertebral body heights are maintained. Dens appears intact.  No prevertebral soft tissue swelling.  Mild multilevel degenerative changes.  Visualized thyroid is unremarkable.  Visualized lung  apices are notable for biapical pleural parenchymal scarring.  IMPRESSION: No evidence of acute intracranial abnormality. Atrophy with small vessel ischemic changes and intracranial atherosclerosis.  No evidence of traumatic injury to the cervical spine. Mild multilevel degenerative changes.   Electronically Signed   By: Julian Hy M.D.   On: 06/04/2014 18:07   Ct Cervical Spine Wo Contrast  06/04/2014   CLINICAL DATA:  Loss of consciousness  EXAM: CT HEAD WITHOUT CONTRAST  CT CERVICAL SPINE WITHOUT CONTRAST  TECHNIQUE: Multidetector CT imaging of the head and cervical spine was performed following the standard protocol without intravenous contrast. Multiplanar CT image reconstructions of the cervical spine were also generated.  COMPARISON:  04/19/2006  FINDINGS: CT HEAD FINDINGS  No evidence of parenchymal hemorrhage or extra-axial fluid collection. No mass lesion, mass effect, or midline shift.  No CT evidence of acute infarction.  Subcortical white matter and periventricular small vessel ischemic changes. Left caudate lacunar infarct. Intracranial atherosclerosis.  The visualized paranasal sinuses are essentially clear. The mastoid air cells are unopacified.  No evidence of calvarial fracture.  CT CERVICAL SPINE FINDINGS  Normal cervical lordosis.  No  evidence of fracture dislocation. Vertebral body heights are maintained. Dens appears intact.  No prevertebral soft tissue swelling.  Mild multilevel degenerative changes.  Visualized thyroid is unremarkable.  Visualized lung apices are notable for biapical pleural parenchymal scarring.  IMPRESSION: No evidence of acute intracranial abnormality. Atrophy with small vessel ischemic changes and intracranial atherosclerosis.  No evidence of traumatic injury to the cervical spine. Mild multilevel degenerative changes.   Electronically Signed   By: Julian Hy M.D.   On: 06/04/2014 18:07   Mr Brain Wo Contrast  06/05/2014   CLINICAL DATA:  Syncope   EXAM: MRI HEAD WITHOUT CONTRAST  MRA HEAD WITHOUT CONTRAST  TECHNIQUE: Multiplanar, multiecho pulse sequences of the brain and surrounding structures were obtained without intravenous contrast. Angiographic images of the head were obtained using MRA technique without contrast.  COMPARISON:  CT 06/04/2014  FINDINGS: MRI HEAD FINDINGS  Small area of acute infarction right parietal white matter measuring approximately 3 x 7 mm. No other area of acute infarct is identified.  Generalized atrophy. Mild chronic microvascular ischemic change in the white matter. Negative for cortical infarct. Brainstem and cerebellum intact.  Image quality degraded by motion on several sequences.  Negative for intracranial hemorrhage. Negative for mass or edema. No shift of the midline structures.  MRA HEAD FINDINGS  Image quality degraded by motion.  The right vertebral artery is small and ends in PICA. Left vertebral artery is small with a severe stenosis at the vertebrobasilar junction. Small basilar artery ends in the superior cerebellar artery bilaterally which are patent. Fetal origin of the posterior cerebral artery bilaterally with hypoplastic P1 segments bilaterally. Diffuse atherosclerotic disease in the posterior cerebral arteries with multiple areas of mild to moderate stenosis.  Cavernous carotid is patent bilaterally. Mild atherosclerotic disease. Mild atherosclerotic irregularity in the middle cerebral artery branches bilaterally. Moderate stenosis right A1 segment. Mild disease in the A2 segments bilaterally.  Negative for cerebral aneurysm or vascular malformation.  IMPRESSION: Small acute infarct right parietal white matter.  Mild to moderate intracranial atherosclerotic disease as described above.   Electronically Signed   By: Franchot Gallo M.D.   On: 06/05/2014 10:02   Mr Jodene Nam Head/brain Wo Cm  06/05/2014   CLINICAL DATA:  Syncope  EXAM: MRI HEAD WITHOUT CONTRAST  MRA HEAD WITHOUT CONTRAST  TECHNIQUE: Multiplanar,  multiecho pulse sequences of the brain and surrounding structures were obtained without intravenous contrast. Angiographic images of the head were obtained using MRA technique without contrast.  COMPARISON:  CT 06/04/2014  FINDINGS: MRI HEAD FINDINGS  Small area of acute infarction right parietal white matter measuring approximately 3 x 7 mm. No other area of acute infarct is identified.  Generalized atrophy. Mild chronic microvascular ischemic change in the white matter. Negative for cortical infarct. Brainstem and cerebellum intact.  Image quality degraded by motion on several sequences.  Negative for intracranial hemorrhage. Negative for mass or edema. No shift of the midline structures.  MRA HEAD FINDINGS  Image quality degraded by motion.  The right vertebral artery is small and ends in PICA. Left vertebral artery is small with a severe stenosis at the vertebrobasilar junction. Small basilar artery ends in the superior cerebellar artery bilaterally which are patent. Fetal origin of the posterior cerebral artery bilaterally with hypoplastic P1 segments bilaterally. Diffuse atherosclerotic disease in the posterior cerebral arteries with multiple areas of mild to moderate stenosis.  Cavernous carotid is patent bilaterally. Mild atherosclerotic disease. Mild atherosclerotic irregularity in the middle cerebral artery branches bilaterally.  Moderate stenosis right A1 segment. Mild disease in the A2 segments bilaterally.  Negative for cerebral aneurysm or vascular malformation.  IMPRESSION: Small acute infarct right parietal white matter.  Mild to moderate intracranial atherosclerotic disease as described above.   Electronically Signed   By: Franchot Gallo M.D.   On: 06/05/2014 10:02        Scheduled Meds: . aspirin  300 mg Rectal Daily   Or  . aspirin  325 mg Oral Daily  . atenolol  50 mg Oral Daily  . cefTRIAXone (ROCEPHIN)  IV  1 g Intravenous Q24H  . cholecalciferol  1,000 Units Oral Daily  .  enoxaparin (LOVENOX) injection  40 mg Subcutaneous Q24H  . folic acid  1 mg Oral Daily  . lactose free nutrition  237 mL Oral BID BM  . LORazepam  0-4 mg Oral Q6H   Followed by  . [START ON 06/06/2014] LORazepam  0-4 mg Oral Q12H  . multivitamin with minerals  1 tablet Oral Daily  . pantoprazole  40 mg Oral Daily  . thiamine  100 mg Oral Daily   Or  . thiamine  100 mg Intravenous Daily   Continuous Infusions:   Active Problems:   Syncope and collapse   Stroke   Alcohol dependence   Dyslipidemia    Time spent: 40 minutes.    Vernell Leep, MD, FACP, FHM. [facility] Pager [phone]  If 7PM-7AM, please contact night-coverage [email or web address] Password [email or web address] 06/05/2014, 2:11 PM    LOS: 1 day

## 2014-06-05 NOTE — Evaluation (Signed)
Speech Language Pathology Evaluation Patient Details Name: [name] MRN: [id number] DOB: 08/04/1941 Today's Date: 06/05/2014 Time: 0940-1005 SLP Time Calculation (min): 25 min  Problem List:  Patient Active Problem List   Diagnosis Date Noted  . Stroke 06/05/2014  . Alcohol dependence 06/05/2014  . Dyslipidemia 06/05/2014  . Syncope and collapse 06/04/2014  . Postoperative anemia due to acute blood loss 12/19/2012  . Osteoporosis 12/19/2012  . Osteoporosis with fracture 12/19/2012  . Hip fracture, left 12/16/2012  . Gout 12/16/2012  . fracture of fifth right metatarsal bone 12/16/2012  . Alcohol abuse 12/16/2012  . GERD (gastroesophageal reflux disease) 12/16/2012  . Atrial fibrillation 12/16/2012  . B12 deficiency 12/16/2012  . Vitamin D deficiency 12/16/2012  . Esophageal stricture 12/16/2012  . Other and unspecified hyperlipidemia 12/16/2012  . Reactive depression (situational) 12/16/2012  . Insomnia 12/16/2012  . H/O: CVA (cerebrovascular accident) 12/16/2012  . Dupuytren's contracture of both hands 12/16/2012  . Fibrocystic breast disease 12/16/2012   Past Medical History:  Past Medical History  Diagnosis Date  . Hypertension   . Gout   . Closed left hip fracture 5/14  . Alcohol abuse   . GERD (gastroesophageal reflux disease)   . Vitamin D deficiency   . Vitamin B12 deficiency   . Depression   . Cerebrovascular accident (stroke) 2/07    left frontal lobe  . Dupuytren's contracture   . Bacterial vaginosis   . Hyperlipidemia LDL goal <70   . Fibrocystic breast disease   . Paroxysmal a-fib   . Renal calculus   . Fracture of metatarsal bone of right foot     5th metatarsal   Past Surgical History:  Past Surgical History  Procedure Laterality Date  . Leg surgery    . Wrist surgery      left wrist  . Hip arthroplasty Left 12/17/2012    Procedure: LEFT HIP HEMI ARTHROPLASTY ;  Surgeon: Rozanna Box, MD;  Location: [facility];  Service: Orthopedics;   Laterality: Left;   HPI:  Latanga Nedrow  is a 73 y.o. 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 She says that the shampoo smelled bad. No chest pain, shortness of breath , nausea , vomiting or diarrhea. Patient reports dizziness after the episode . She reports losing consciousness for around 5 minutes no seizure activity. Patient reports drinking 2 glasses of vodka with cranberry a day. She also reports multiple episodes of losing consciousness at home and one time she had a hip fracture after a fall status post arthroplasty. Patient is not on seizure medications. She was tremulous and shaking in the ER and reports one drink of alcohol today.    Assessment / Plan / Recommendation Clinical Impression  Patient appears to be at baseline cognitive functioning, and is within normal limits for memory, awareness, reasoning and cognitive-linguisitic abilities. She was able to recall and describe events surrounding this hospitalization, and describe medical tests and interventions that have occured since this hospital admission. She stated that she had been prescribed heart medication (BP, blood thinner) previously by her MD, but that she stopped taking it when she felt better. Patient will benefit from assistance from family to encourage her to take her medications as prescribed as she may be non-compliant.    SLP Assessment  Patient does not need any further Speech Lanaguage Pathology Services    Follow Up Recommendations  None  Frequency and Duration   N/A     Pertinent Vitals/Pain Pain Assessment: No/denies pain   SLP Goals  Patient/Family Stated Goal: patient feels fine at the present time and appeared willing to stay in hospital until MD has completed testing, etc.  SLP Evaluation Prior Functioning  Cognitive/Linguistic Baseline: Within  functional limits Type of Home: Apartment  Lives With: Alone Available Help at Discharge: Family;Available 24 hours/day   Cognition  Overall Cognitive Status: Within Functional Limits for tasks assessed Arousal/Alertness: Awake/alert Orientation Level: Oriented X4 Memory: Appears intact Awareness: Appears intact Problem Solving: Appears intact Executive Function: Reasoning;Decision Making Reasoning: Appears intact Decision Making: Appears intact Safety/Judgment: Appears intact Comments: Patient acknowledged that she had been prescribed blood pressure and blood thinner medication but that she "stopped taking it when I felt alright"    Comprehension  Auditory Comprehension Overall Auditory Comprehension: Appears within functional limits for tasks assessed    Expression Expression Primary Mode of Expression: Verbal Verbal Expression Overall Verbal Expression: Appears within functional limits for tasks assessed Non-Verbal Means of Communication: Not applicable Written Expression Dominant Hand: Right Written Expression: Not tested   Oral / Motor Oral Motor/Sensory Function Overall Oral Motor/Sensory Function: Appears within functional limits for tasks assessed Motor Speech Overall Motor Speech: Appears within functional limits for tasks assessed   GO     Dannial Monarch 06/05/2014, 5:11 PM  Sonia Baller, MA, CCC-SLP [facility] Speech-Language Pathologist

## 2014-06-05 NOTE — Progress Notes (Signed)
EEG completed; results pending.    

## 2014-06-05 NOTE — Evaluation (Signed)
Physical Therapy Evaluation Patient Details Name: [name] MRN: [id number] DOB: 29-Oct-1940 Today's Date: 06/05/2014   History of Present Illness  This 73 y.o. female admitted after syncopal episode while at hair dresser washing her hair.   MRI of brain + for acute Rt. parietal infarct.  PMH: h/o syncopal episodes 3-4 x/month; ETOH dependence; CVA; hip fracture  Clinical Impression  Pt admitted with/for syncope and determined to have suffered stroke.  Pt currently limited functionally due to the problems listed. ( See problems list.)   Pt will benefit from PT to maximize function and safety in order to get ready for next venue listed below.     Follow Up Recommendations SNF;Other (comment) (hopes to get her son to stay with her awhile)    Equipment Recommendations  None recommended by PT    Recommendations for Other Services       Precautions / Restrictions Precautions Precautions: Fall      Mobility  Bed Mobility Overal bed mobility: Independent                Transfers Overall transfer level: Needs assistance   Transfers: Sit to/from Stand Sit to Stand: Min guard Stand pivot transfers: Min guard       General transfer comment: min guard assist for balance; uses the back of her legs to steady herself.  Ambulation/Gait Ambulation/Gait assistance: Min guard Ambulation Distance (Feet): 300 Feet Assistive device: None Gait Pattern/deviations: Step-through pattern;Staggering left;Staggering right;Drifts right/left Gait velocity: able to speed up appreciably, but becomes noticeably more unsteady. Gait velocity interpretation: Below normal speed for age/gender General Gait Details: Generally, mildly unsteady with mild staggering at times and wandering all in attempt to maintain balance.  Appears guarded much of the time with UE in guard position.  Stairs            Wheelchair Mobility    Modified Rankin (Stroke Patients Only) Modified Rankin (Stroke  Patients Only) Pre-Morbid Rankin Score: No symptoms Modified Rankin: Moderate disability     Balance Overall balance assessment: Needs assistance Sitting-balance support: No upper extremity supported Sitting balance-Leahy Scale: Good Sitting balance - Comments: bases on ability to accept challenge at EOB   Standing balance support: No upper extremity supported Standing balance-Leahy Scale: Fair Standing balance comment: could maintain standing at EOB, but not accept challenge and was guarded through out.                 Standardized Balance Assessment Standardized Balance Assessment : Dynamic Gait Index   Dynamic Gait Index Level Surface: Mild Impairment Change in Gait Speed: Mild Impairment Gait with Horizontal Head Turns: Mild Impairment Gait with Vertical Head Turns: Mild Impairment Gait and Pivot Turn: Normal Step Over Obstacle: Mild Impairment       Pertinent Vitals/Pain Pain Assessment: No/denies pain    Home Living Family/patient expects to be discharged to:: Skilled nursing facility Living Arrangements: Alone Available Help at Discharge: Family;Available 24 hours/day Type of Home: Apartment Home Access: Level entry     Home Layout: One level Home Equipment: Walker - 2 wheels Additional Comments: Pt reports she thinks her son may stay with her for 1-2 weeks at discharge, but she is uncertain.  If he does not she is receptive to SNF    Prior Function Level of Independence: Independent         Comments: Pt reports she was fully independent including driving and community activities.  She does endorse a fall in 6/14 which resulted in hip fracture. Marland Kitchen  Hand Dominance   Dominant Hand: Right    Extremity/Trunk Assessment   Upper Extremity Assessment: Defer to OT evaluation RUE Deficits / Details: Pt with chronic tremor of Rt. hand.  She reports she has learned to compensate for it.  She has flexor contracture Rt  little finger  - ? Dupuytren's       LUE Deficits / Details: Pt with flexor contractures of digits 4 &5 on Lt hand - ? Dupuytren's    Lower Extremity Assessment: Overall WFL for tasks assessed (L LE mildly weaker overall than R LE)      Cervical / Trunk Assessment: Normal  Communication   Communication: No difficulties  Cognition Arousal/Alertness: Awake/alert Behavior During Therapy: WFL for tasks assessed/performed Overall Cognitive Status: Within Functional Limits for tasks assessed                      General Comments General comments (skin integrity, edema, etc.): Pt is receptive to SNF reporting she has been to [facility] x 2    Exercises        Assessment/Plan    PT Assessment Patient needs continued PT services  PT Diagnosis Difficulty walking   PT Problem List Decreased activity tolerance;Decreased balance;Decreased mobility;Decreased strength  PT Treatment Interventions Gait training;Stair training;Functional mobility training;Therapeutic activities;Balance training;Patient/family education   PT Goals (Current goals can be found in the Care Plan section) Acute Rehab PT Goals Patient Stated Goal: to get better  PT Goal Formulation: With patient Time For Goal Achievement: 06/12/14 Potential to Achieve Goals: Good    Frequency Min 3X/week   Barriers to discharge Decreased caregiver support      Co-evaluation               End of Session   Activity Tolerance: Patient tolerated treatment well Patient left: in bed;with call bell/phone within reach Nurse Communication: Mobility status         Time: 0938-1829 PT Time Calculation (min): 18 min   Charges:   PT Evaluation $Initial PT Evaluation Tier I: 1 Procedure PT Treatments $Gait Training: 8-22 mins   PT G Codes:          Olla Delancey, Tessie Fass 06/05/2014, 5:24 PM  06/05/2014  Donnella Sham, PT [phone]  (pager)

## 2014-06-05 NOTE — Progress Notes (Signed)
*  PRELIMINARY RESULTS* Echocardiogram 2D Echocardiogram has been performed.  [name] 06/05/2014, 2:08 PM

## 2014-06-05 NOTE — Progress Notes (Signed)
UR completed 

## 2014-06-05 NOTE — Consult Note (Signed)
Referring Physician: Dr. Algis Liming    Chief Complaint: stroke on MRI  HPI:                                                                                                                                         [name] is an 73 y.o. 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 As part of the work up, a brain MRI was obtained and the study showed a small area of acute infarction right parietal white matter measuring approximately 3 x 7 mm. MRA brain: mild to moderate multifocal intracranial atherosclerotic disease.  Patient is on aspirin. [name] denies HA, vertigo, double vision, difficulty swallowing, focal weakness or numbness, slurred speech, language or vision impairment. Date last known well: unable to determine Time last known well: unable to determine tPA Given: no, patient asymptomatic NIHSS: 0   Past Medical History  Diagnosis Date  . Hypertension   . Gout   . Closed left hip fracture 5/14  . Alcohol abuse   . GERD (gastroesophageal reflux disease)   . Vitamin D deficiency   . Vitamin B12 deficiency   . Depression   . Cerebrovascular accident (stroke) 2/07    left frontal lobe  . Dupuytren's contracture   . Bacterial vaginosis   . Hyperlipidemia LDL goal <70   . Fibrocystic breast disease   . Paroxysmal a-fib   . Renal calculus   . Fracture of metatarsal bone of right foot     5th metatarsal    Past Surgical History  Procedure Laterality Date  . Leg surgery    . Wrist surgery      left wrist  . Hip arthroplasty Left 12/17/2012    Procedure: LEFT HIP HEMI ARTHROPLASTY ;  Surgeon: Rozanna Box, MD;  Location: [facility];  Service: Orthopedics;  Laterality: Left;    Family History  Problem Relation Age of Onset  . Stroke Mother    Social History:  reports that she has never smoked. She does not  have any smokeless tobacco history on file. She reports that she drinks about 8.4 ounces of alcohol per week. She reports that she does not use illicit drugs.  Allergies:  Allergies  Allergen Reactions  . Ambien [Zolpidem Tartrate]     Causes nightmares    Medications:  Scheduled: . aspirin  325 mg Oral Daily  . [START ON 06/06/2014] atenolol  25 mg Oral Daily  . cefTRIAXone (ROCEPHIN)  IV  1 g Intravenous Q24H  . cholecalciferol  1,000 Units Oral Daily  . enoxaparin (LOVENOX) injection  40 mg Subcutaneous Q24H  . folic acid  1 mg Oral Daily  . lactose free nutrition  237 mL Oral BID BM  . LORazepam  0-4 mg Oral Q6H   Followed by  . [START ON 06/06/2014] LORazepam  0-4 mg Oral Q12H  . multivitamin with minerals  1 tablet Oral Daily  . pantoprazole  40 mg Oral Daily  . simvastatin  20 mg Oral q1800  . thiamine  100 mg Oral Daily    ROS:                                                                                                                                       History obtained from the patient and chart review.  General ROS: negative for - chills, fatigue, fever, night sweats, weight gain or weight loss Psychological ROS: negative for - behavioral disorder, hallucinations, memory difficulties, mood swings or suicidal ideation Ophthalmic ROS: negative for - blurry vision, double vision, eye pain or loss of vision ENT ROS: negative for - epistaxis, nasal discharge, oral lesions, sore throat, tinnitus or vertigo Allergy and Immunology ROS: negative for - hives or itchy/watery eyes Hematological and Lymphatic ROS: negative for - bleeding problems, bruising or swollen lymph nodes Endocrine ROS: negative for - galactorrhea, hair pattern changes, polydipsia/polyuria or temperature intolerance Respiratory ROS: negative for - cough, hemoptysis, shortness of  breath or wheezing Cardiovascular ROS: negative for - chest pain, dyspnea on exertion, edema or irregular heartbeat Gastrointestinal ROS: negative for - abdominal pain, diarrhea, hematemesis, nausea/vomiting or stool incontinence Genito-Urinary ROS: negative for - dysuria, hematuria, incontinence or urinary frequency/urgency Musculoskeletal ROS: negative for - joint swelling or muscular weakness Neurological ROS: as noted in HPI Dermatological ROS: negative for rash and skin lesion changes  Physical exam: pleasant female in no apparent distress. Blood pressure 107/56, pulse 58, temperature 98.2 F (36.8 C), temperature source Oral, resp. rate 16, height 5' 10"  (1.778 m), weight 63.504 kg (140 lb), SpO2 96.00%. Head: normocephalic. Neck: supple, no bruits, no JVD. Cardiac: no murmurs. Lungs: clear. Abdomen: soft, no tender, no mass. Extremities: no edema. Neurologic Examination:  General: Mental Status: Alert, oriented, thought content appropriate.  Speech fluent without evidence of aphasia.  Able to follow 3 step commands without difficulty. Cranial Nerves: II: Discs flat bilaterally; Visual fields grossly normal, pupils equal, round, reactive to light and accommodation III,IV, VI: ptosis not present, extra-ocular motions intact bilaterally V,VII: smile symmetric, facial light touch sensation normal bilaterally VIII: hearing normal bilaterally IX,X: gag reflex present XI: bilateral shoulder shrug XII: midline tongue extension without atrophy or fasciculations Motor: Right : Upper extremity   5/5    Left:     Upper extremity   5/5  Lower extremity   5/5     Lower extremity   5/5 Tone and bulk:normal tone throughout; no atrophy noted Sensory: Pinprick and light touch intact throughout, bilaterally Deep Tendon Reflexes:  Right: Upper Extremity   Left: Upper extremity   biceps (C-5 to  C-6) 2/4   biceps (C-5 to C-6) 2/4 tricep (C7) 2/4    triceps (C7) 2/4 Brachioradialis (C6) 2/4  Brachioradialis (C6) 2/4  Lower Extremity Lower Extremity  quadriceps (L-2 to L-4) 2/4   quadriceps (L-2 to L-4) 2/4 Achilles (S1) 2/4   Achilles (S1) 2/4  Plantars: Right: downgoing   Left: downgoing Cerebellar: normal finger-to-nose,  normal heel-to-shin test Gait:  No tested  Results for orders placed during the hospital encounter of 06/04/14 (from the past 48 hour(s))  ETHANOL     Status: Abnormal   Collection Time    06/04/14  4:31 PM      Result Value Ref Range   Alcohol, Ethyl (B) 62 (*) 0 - 11 mg/dL   Comment:            LOWEST DETECTABLE LIMIT FOR     SERUM ALCOHOL IS 11 mg/dL     FOR MEDICAL PURPOSES ONLY  APTT     Status: None   Collection Time    06/04/14  4:31 PM      Result Value Ref Range   aPTT 30  24 - 37 seconds  CBC WITH DIFFERENTIAL     Status: Abnormal   Collection Time    06/04/14  4:31 PM      Result Value Ref Range   WBC 6.5  4.0 - 10.5 K/uL   RBC 5.19 (*) 3.87 - 5.11 MIL/uL   Hemoglobin 16.0 (*) 12.0 - 15.0 g/dL   HCT 47.4 (*) 36.0 - 46.0 %   MCV 91.3  78.0 - 100.0 fL   MCH 30.8  26.0 - 34.0 pg   MCHC 33.8  30.0 - 36.0 g/dL   RDW 12.3  11.5 - 15.5 %   Platelets 298  150 - 400 K/uL   Neutrophils Relative % 67  43 - 77 %   Neutro Abs 4.3  1.7 - 7.7 K/uL   Lymphocytes Relative 25  12 - 46 %   Lymphs Abs 1.6  0.7 - 4.0 K/uL   Monocytes Relative 5  3 - 12 %   Monocytes Absolute 0.4  0.1 - 1.0 K/uL   Eosinophils Relative 2  0 - 5 %   Eosinophils Absolute 0.1  0.0 - 0.7 K/uL   Basophils Relative 1  0 - 1 %   Basophils Absolute 0.1  0.0 - 0.1 K/uL  COMPREHENSIVE METABOLIC PANEL     Status: Abnormal   Collection Time    06/04/14  4:31 PM      Result Value Ref Range   Sodium 133 (*) 137 - 147 mEq/L  Potassium 4.6  3.7 - 5.3 mEq/L   Chloride 94 (*) 96 - 112 mEq/L   CO2 24  19 - 32 mEq/L   Glucose, Bld 116 (*) 70 - 99 mg/dL   BUN 8  6 - 23 mg/dL    Creatinine, Ser 0.67  0.50 - 1.10 mg/dL   Calcium 9.1  8.4 - 10.5 mg/dL   Total Protein 7.6  6.0 - 8.3 g/dL   Albumin 3.8  3.5 - 5.2 g/dL   AST 16  0 - 37 U/L   ALT 12  0 - 35 U/L   Alkaline Phosphatase 96  39 - 117 U/L   Total Bilirubin 0.3  0.3 - 1.2 mg/dL   GFR calc non Af Amer 84 (*) >90 mL/min   GFR calc Af Amer >90  >90 mL/min   Comment: (NOTE)     The eGFR has been calculated using the CKD EPI equation.     This calculation has not been validated in all clinical situations.     eGFR's persistently <90 mL/min signify possible Chronic Kidney     Disease.   Anion gap 15  5 - 15  PROTIME-INR     Status: None   Collection Time    06/04/14  4:31 PM      Result Value Ref Range   Prothrombin Time 13.3  11.6 - 15.2 seconds   INR 1.00  0.00 - 1.49  AMMONIA     Status: None   Collection Time    06/04/14  4:31 PM      Result Value Ref Range   Ammonia 21  11 - 60 umol/L  MAGNESIUM     Status: None   Collection Time    06/04/14  4:31 PM      Result Value Ref Range   Magnesium 2.0  1.5 - 2.5 mg/dL  PHOSPHORUS     Status: None   Collection Time    06/04/14  4:31 PM      Result Value Ref Range   Phosphorus 3.0  2.3 - 4.6 mg/dL  I-STAT CHEM 8, ED     Status: Abnormal   Collection Time    06/04/14  4:42 PM      Result Value Ref Range   Sodium 133 (*) 137 - 147 mEq/L   Potassium 4.3  3.7 - 5.3 mEq/L   Chloride 100  96 - 112 mEq/L   BUN 7  6 - 23 mg/dL   Creatinine, Ser 0.80  0.50 - 1.10 mg/dL   Glucose, Bld 119 (*) 70 - 99 mg/dL   Calcium, Ion 1.08 (*) 1.13 - 1.30 mmol/L   TCO2 22  0 - 100 mmol/L   Hemoglobin 17.7 (*) 12.0 - 15.0 g/dL   HCT 52.0 (*) 36.0 - 46.0 %  URINE RAPID DRUG SCREEN (HOSP PERFORMED)     Status: None   Collection Time    06/04/14  5:23 PM      Result Value Ref Range   Opiates NONE DETECTED  NONE DETECTED   Cocaine NONE DETECTED  NONE DETECTED   Benzodiazepines NONE DETECTED  NONE DETECTED   Amphetamines NONE DETECTED  NONE DETECTED    Tetrahydrocannabinol NONE DETECTED  NONE DETECTED   Barbiturates NONE DETECTED  NONE DETECTED   Comment:            DRUG SCREEN FOR MEDICAL PURPOSES     ONLY.  IF CONFIRMATION IS NEEDED     FOR ANY PURPOSE, NOTIFY  LAB     WITHIN 5 DAYS.                LOWEST DETECTABLE LIMITS     FOR URINE DRUG SCREEN     Drug Class       Cutoff (ng/mL)     Amphetamine      1000     Barbiturate      200     Benzodiazepine   370     Tricyclics       488     Opiates          300     Cocaine          300     THC              50  URINALYSIS, ROUTINE W REFLEX MICROSCOPIC     Status: Abnormal   Collection Time    06/04/14  5:23 PM      Result Value Ref Range   Color, Urine YELLOW  YELLOW   APPearance CLEAR  CLEAR   Specific Gravity, Urine 1.006  1.005 - 1.030   pH 5.0  5.0 - 8.0   Glucose, UA NEGATIVE  NEGATIVE mg/dL   Hgb urine dipstick TRACE (*) NEGATIVE   Bilirubin Urine NEGATIVE  NEGATIVE   Ketones, ur NEGATIVE  NEGATIVE mg/dL   Protein, ur NEGATIVE  NEGATIVE mg/dL   Urobilinogen, UA 0.2  0.0 - 1.0 mg/dL   Nitrite NEGATIVE  NEGATIVE   Leukocytes, UA SMALL (*) NEGATIVE  URINE MICROSCOPIC-ADD ON     Status: Abnormal   Collection Time    06/04/14  5:23 PM      Result Value Ref Range   Squamous Epithelial / LPF RARE  RARE   WBC, UA 11-20  <3 WBC/hpf   RBC / HPF 0-2  <3 RBC/hpf   Bacteria, UA RARE  RARE   Casts GRANULAR CAST (*) NEGATIVE  I-STAT TROPOININ, ED     Status: None   Collection Time    06/04/14  6:45 PM      Result Value Ref Range   Troponin i, poc 0.01  0.00 - 0.08 ng/mL   Comment 3            Comment: Due to the release kinetics of cTnI,     a negative result within the first hours     of the onset of symptoms does not rule out     myocardial infarction with certainty.     If myocardial infarction is still suspected,     repeat the test at appropriate intervals.  TROPONIN I     Status: None   Collection Time    06/04/14 11:00 PM      Result Value Ref Range   Troponin I  <0.30  <0.30 ng/mL   Comment:            Due to the release kinetics of cTnI,     a negative result within the first hours     of the onset of symptoms does not rule out     myocardial infarction with certainty.     If myocardial infarction is still suspected,     repeat the test at appropriate intervals.  LIPID PANEL     Status: Abnormal   Collection Time    06/05/14  3:14 AM      Result Value Ref Range   Cholesterol 180  0 - 200 mg/dL  Triglycerides 275 (*) <150 mg/dL   HDL 41  >39 mg/dL   Total CHOL/HDL Ratio 4.4     VLDL 55 (*) 0 - 40 mg/dL   LDL Cholesterol 84  0 - 99 mg/dL   Comment:            Total Cholesterol/HDL:CHD Risk     Coronary Heart Disease Risk Table                         Men   Women      1/2 Average Risk   3.4   3.3      Average Risk       5.0   4.4      2 X Average Risk   9.6   7.1      3 X Average Risk  23.4   11.0                Use the calculated Patient Ratio     above and the CHD Risk Table     to determine the patient's CHD Risk.                ATP III CLASSIFICATION (LDL):      <100     mg/dL   Optimal      100-129  mg/dL   Near or Above                        Optimal      130-159  mg/dL   Borderline      160-189  mg/dL   High      >190     mg/dL   Very High  HEMOGLOBIN A1C     Status: Abnormal   Collection Time    06/05/14  3:18 AM      Result Value Ref Range   Hemoglobin A1C 6.3 (*) <5.7 %   Comment: (NOTE)                                                                               According to the ADA Clinical Practice Recommendations for 2011, when     HbA1c is used as a screening test:      >=6.5%   Diagnostic of Diabetes Mellitus               (if abnormal result is confirmed)     5.7-6.4%   Increased risk of developing Diabetes Mellitus     References:Diagnosis and Classification of Diabetes Mellitus,Diabetes     MLYY,5035,46(FKCLE 1):S62-S69 and Standards of Medical Care in             Diabetes - 2011,Diabetes Care,2011,34  (Suppl 1):S11-S61.   Mean Plasma Glucose 134 (*) <117 mg/dL   Comment: Performed at [facility]  TROPONIN I     Status: None   Collection Time    06/05/14  3:18 AM      Result Value Ref Range   Troponin I <0.30  <0.30 ng/mL   Comment:            Due to the release kinetics of cTnI,  a negative result within the first hours     of the onset of symptoms does not rule out     myocardial infarction with certainty.     If myocardial infarction is still suspected,     repeat the test at appropriate intervals.  TROPONIN I     Status: None   Collection Time    06/05/14  3:18 PM      Result Value Ref Range   Troponin I <0.30  <0.30 ng/mL   Comment:            Due to the release kinetics of cTnI,     a negative result within the first hours     of the onset of symptoms does not rule out     myocardial infarction with certainty.     If myocardial infarction is still suspected,     repeat the test at appropriate intervals.   Dg Chest 2 View  06/04/2014   CLINICAL DATA:  Status post fall, lost consciousness for 1 min  EXAM: CHEST  2 VIEW  COMPARISON:  08/29/2013  FINDINGS: There is left basilar airspace disease likely reflecting scarring. There is no focal parenchymal opacity, pleural effusion, or pneumothorax. The heart and mediastinal contours are unremarkable.  The osseous structures are unremarkable.  IMPRESSION: No active cardiopulmonary disease.   Electronically Signed   By: Kathreen Devoid   On: 06/04/2014 16:52   Ct Head Wo Contrast  06/04/2014   CLINICAL DATA:  Loss of consciousness  EXAM: CT HEAD WITHOUT CONTRAST  CT CERVICAL SPINE WITHOUT CONTRAST  TECHNIQUE: Multidetector CT imaging of the head and cervical spine was performed following the standard protocol without intravenous contrast. Multiplanar CT image reconstructions of the cervical spine were also generated.  COMPARISON:  04/19/2006  FINDINGS: CT HEAD FINDINGS  No evidence of parenchymal hemorrhage or extra-axial  fluid collection. No mass lesion, mass effect, or midline shift.  No CT evidence of acute infarction.  Subcortical white matter and periventricular small vessel ischemic changes. Left caudate lacunar infarct. Intracranial atherosclerosis.  The visualized paranasal sinuses are essentially clear. The mastoid air cells are unopacified.  No evidence of calvarial fracture.  CT CERVICAL SPINE FINDINGS  Normal cervical lordosis.  No evidence of fracture dislocation. Vertebral body heights are maintained. Dens appears intact.  No prevertebral soft tissue swelling.  Mild multilevel degenerative changes.  Visualized thyroid is unremarkable.  Visualized lung apices are notable for biapical pleural parenchymal scarring.  IMPRESSION: No evidence of acute intracranial abnormality. Atrophy with small vessel ischemic changes and intracranial atherosclerosis.  No evidence of traumatic injury to the cervical spine. Mild multilevel degenerative changes.   Electronically Signed   By: Julian Hy M.D.   On: 06/04/2014 18:07   Ct Cervical Spine Wo Contrast  06/04/2014   CLINICAL DATA:  Loss of consciousness  EXAM: CT HEAD WITHOUT CONTRAST  CT CERVICAL SPINE WITHOUT CONTRAST  TECHNIQUE: Multidetector CT imaging of the head and cervical spine was performed following the standard protocol without intravenous contrast. Multiplanar CT image reconstructions of the cervical spine were also generated.  COMPARISON:  04/19/2006  FINDINGS: CT HEAD FINDINGS  No evidence of parenchymal hemorrhage or extra-axial fluid collection. No mass lesion, mass effect, or midline shift.  No CT evidence of acute infarction.  Subcortical white matter and periventricular small vessel ischemic changes. Left caudate lacunar infarct. Intracranial atherosclerosis.  The visualized paranasal sinuses are essentially clear. The mastoid air cells are unopacified.  No evidence of calvarial  fracture.  CT CERVICAL SPINE FINDINGS  Normal cervical lordosis.  No  evidence of fracture dislocation. Vertebral body heights are maintained. Dens appears intact.  No prevertebral soft tissue swelling.  Mild multilevel degenerative changes.  Visualized thyroid is unremarkable.  Visualized lung apices are notable for biapical pleural parenchymal scarring.  IMPRESSION: No evidence of acute intracranial abnormality. Atrophy with small vessel ischemic changes and intracranial atherosclerosis.  No evidence of traumatic injury to the cervical spine. Mild multilevel degenerative changes.   Electronically Signed   By: Julian Hy M.D.   On: 06/04/2014 18:07   Mr Brain Wo Contrast  06/05/2014   CLINICAL DATA:  Syncope  EXAM: MRI HEAD WITHOUT CONTRAST  MRA HEAD WITHOUT CONTRAST  TECHNIQUE: Multiplanar, multiecho pulse sequences of the brain and surrounding structures were obtained without intravenous contrast. Angiographic images of the head were obtained using MRA technique without contrast.  COMPARISON:  CT 06/04/2014  FINDINGS: MRI HEAD FINDINGS  Small area of acute infarction right parietal white matter measuring approximately 3 x 7 mm. No other area of acute infarct is identified.  Generalized atrophy. Mild chronic microvascular ischemic change in the white matter. Negative for cortical infarct. Brainstem and cerebellum intact.  Image quality degraded by motion on several sequences.  Negative for intracranial hemorrhage. Negative for mass or edema. No shift of the midline structures.  MRA HEAD FINDINGS  Image quality degraded by motion.  The right vertebral artery is small and ends in PICA. Left vertebral artery is small with a severe stenosis at the vertebrobasilar junction. Small basilar artery ends in the superior cerebellar artery bilaterally which are patent. Fetal origin of the posterior cerebral artery bilaterally with hypoplastic P1 segments bilaterally. Diffuse atherosclerotic disease in the posterior cerebral arteries with multiple areas of mild to moderate stenosis.   Cavernous carotid is patent bilaterally. Mild atherosclerotic disease. Mild atherosclerotic irregularity in the middle cerebral artery branches bilaterally. Moderate stenosis right A1 segment. Mild disease in the A2 segments bilaterally.  Negative for cerebral aneurysm or vascular malformation.  IMPRESSION: Small acute infarct right parietal white matter.  Mild to moderate intracranial atherosclerotic disease as described above.   Electronically Signed   By: Franchot Gallo M.D.   On: 06/05/2014 10:02   Mr Jodene Nam Head/brain Wo Cm  06/05/2014   CLINICAL DATA:  Syncope  EXAM: MRI HEAD WITHOUT CONTRAST  MRA HEAD WITHOUT CONTRAST  TECHNIQUE: Multiplanar, multiecho pulse sequences of the brain and surrounding structures were obtained without intravenous contrast. Angiographic images of the head were obtained using MRA technique without contrast.  COMPARISON:  CT 06/04/2014  FINDINGS: MRI HEAD FINDINGS  Small area of acute infarction right parietal white matter measuring approximately 3 x 7 mm. No other area of acute infarct is identified.  Generalized atrophy. Mild chronic microvascular ischemic change in the white matter. Negative for cortical infarct. Brainstem and cerebellum intact.  Image quality degraded by motion on several sequences.  Negative for intracranial hemorrhage. Negative for mass or edema. No shift of the midline structures.  MRA HEAD FINDINGS  Image quality degraded by motion.  The right vertebral artery is small and ends in PICA. Left vertebral artery is small with a severe stenosis at the vertebrobasilar junction. Small basilar artery ends in the superior cerebellar artery bilaterally which are patent. Fetal origin of the posterior cerebral artery bilaterally with hypoplastic P1 segments bilaterally. Diffuse atherosclerotic disease in the posterior cerebral arteries with multiple areas of mild to moderate stenosis.  Cavernous carotid is patent bilaterally. Mild  atherosclerotic disease. Mild  atherosclerotic irregularity in the middle cerebral artery branches bilaterally. Moderate stenosis right A1 segment. Mild disease in the A2 segments bilaterally.  Negative for cerebral aneurysm or vascular malformation.  IMPRESSION: Small acute infarct right parietal white matter.  Mild to moderate intracranial atherosclerotic disease as described above.   Electronically Signed   By: Franchot Gallo M.D.   On: 06/05/2014 10:02     Assessment: 73 y.o. female with multiple risk factors for stroke and incidental silent small ischemic infarct right parietal region. Silent stroke confers a higher risk of subsequent stroke. Looks like she had had 2 embolic infarcts and she has a history of PAF. Will need to complete stroke work up and then decide whether or not she is an appropriate candidate for chronic anticoagulation with NOAC. Stroke tem will follow up tomorrow.  Stroke Risk Factors - age, HTN, hyperlipidemia, PAF, prior stroke, alcohol abuse.  Plan: 1. HgbA1c, fasting lipid panel 2. MRI, MRA  of the brain without contrast (done) 3. Echocardiogram 4. Carotid dopplers 5. Prophylactic therapy-aspirin pending results stroke work up 6. Risk factor modification 7. Telemetry monitoring 8. Frequent neuro checks 9. PT/OT SLP ( no need at this moment)  Dorian Pod, MD [facility] [phone]  06/05/2014, 5:56 PM

## 2014-06-05 NOTE — Progress Notes (Signed)
INITIAL NUTRITION ASSESSMENT  DOCUMENTATION CODES Per approved criteria  -Severe malnutrition in the context of chronic illness  Pt meets criteria for severe MALNUTRITION in the context of chronic illness as evidenced by severe fat and muscle wasting.  INTERVENTION: - Boost Plus BID - RD will continue to monitor  NUTRITION DIAGNOSIS: Inadequate oral intake related to alcohol abuse as evidenced by reported wt loss.   Goal: Pt to meet >/= 90% of their estimated nutrition needs   Monitor:  Weight trend, po intake, acceptance of supplements, labs  Reason for Assessment: MST  73 y.o. female  Admitting Dx: <principal problem not specified>  ASSESSMENT: 73 y.o. 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  - Pt reports poor po prior to admission. She currently feels hungry - Diet has been upgraded to Heart Healthy and pt received a sandwich and chips from RN.  - Pt reports wt loss over the past several years of unknown amount.   Nutrition Focused Physical Exam:  Subcutaneous Fat:  Orbital Region: severe wasting Upper Arm Region: moderate wasting Thoracic and Lumbar Region: moderate wasting  Muscle:  Temple Region: severe wasting Clavicle Bone Region: severe wasting Clavicle and Acromion Bone Region: moderate to severe wasting Scapular Bone Region: moderate to severe wasting Dorsal Hand: moderate wasting Patellar Region: moderate to severe wasting Anterior Thigh Region: moderate wasting Posterior Calf Region: moderate wasting  Edema: none  Height: Ht Readings from Last 1 Encounters:  06/04/14 5\' 10"  (1.778 m)    Weight: Wt Readings from Last 1 Encounters:  06/04/14 140 lb (63.504 kg)    Ideal Body Weight: 68.5 kg  % Ideal Body Weight: 93%  Wt Readings from Last 10 Encounters:  06/04/14 140 lb (63.504 kg)   08/29/13 130 lb 8 oz (59.194 kg)  12/25/12 140 lb (63.504 kg)  12/16/12 143 lb 4.8 oz (65 kg)  12/16/12 143 lb 4.8 oz (65 kg)    Usual Body Weight: unknown  % Usual Body Weight: unknown  BMI:  Body mass index is 20.09 kg/(m^2).  Estimated Nutritional Needs: Kcal: 1700-1900 Protein: 85-100 g Fluid: 1.9 L/day  Skin: intact  Diet Order: Cardiac  EDUCATION NEEDS: -Education needs addressed  No intake or output data in the 24 hours ending 06/05/14 1241  Last BM: prior to admission   Labs:   Recent Labs Lab 06/04/14 1631 06/04/14 1642  NA 133* 133*  K 4.6 4.3  CL 94* 100  CO2 24  --   BUN 8 7  CREATININE 0.67 0.80  CALCIUM 9.1  --   MG 2.0  --   PHOS 3.0  --   GLUCOSE 116* 119*    CBG (last 3)  No results found for this basename: GLUCAP,  in the last 72 hours  Scheduled Meds: . aspirin  300 mg Rectal Daily   Or  . aspirin  325 mg Oral Daily  . atenolol  50 mg Oral Daily  . cefTRIAXone (ROCEPHIN)  IV  1 g Intravenous Q24H  . cholecalciferol  1,000 Units Oral Daily  . enoxaparin (LOVENOX) injection  40 mg Subcutaneous Q24H  . folic acid  1 mg Oral Daily  . LORazepam  0-4 mg Oral Q6H   Followed by  . [START ON 06/06/2014] LORazepam  0-4 mg Oral Q12H  . multivitamin with minerals  1 tablet Oral Daily  . pantoprazole  40 mg Oral Daily  .  thiamine  100 mg Oral Daily   Or  . thiamine  100 mg Intravenous Daily    Continuous Infusions:   Past Medical History  Diagnosis Date  . Hypertension   . Gout   . Closed left hip fracture 5/14  . Alcohol abuse   . GERD (gastroesophageal reflux disease)   . Vitamin D deficiency   . Vitamin B12 deficiency   . Depression   . Cerebrovascular accident (stroke) 2/07    left frontal lobe  . Dupuytren's contracture   . Bacterial vaginosis   . Hyperlipidemia LDL goal <70   . Fibrocystic breast disease   . Paroxysmal a-fib   . Renal calculus   . Fracture of metatarsal bone of right foot     5th metatarsal     Past Surgical History  Procedure Laterality Date  . Leg surgery    . Wrist surgery      left wrist  . Hip arthroplasty Left 12/17/2012    Procedure: LEFT HIP HEMI ARTHROPLASTY ;  Surgeon: Rozanna Box, MD;  Location: [place];  Service: Orthopedics;  Laterality: Left;    Laurette Schimke RD, LDN

## 2014-06-06 DIAGNOSIS — I6309 Cerebral infarction due to thrombosis of other precerebral artery: Secondary | ICD-10-CM

## 2014-06-06 LAB — CBC
HEMATOCRIT: 42.8 % (ref 36.0–46.0)
Hemoglobin: 14 g/dL (ref 12.0–15.0)
MCH: 31 pg (ref 26.0–34.0)
MCHC: 32.7 g/dL (ref 30.0–36.0)
MCV: 94.7 fL (ref 78.0–100.0)
PLATELETS: 262 10*3/uL (ref 150–400)
RBC: 4.52 MIL/uL (ref 3.87–5.11)
RDW: 12.4 % (ref 11.5–15.5)
WBC: 7.3 10*3/uL (ref 4.0–10.5)

## 2014-06-06 LAB — BASIC METABOLIC PANEL
ANION GAP: 14 (ref 5–15)
BUN: 14 mg/dL (ref 6–23)
CALCIUM: 8.6 mg/dL (ref 8.4–10.5)
CHLORIDE: 104 meq/L (ref 96–112)
CO2: 20 meq/L (ref 19–32)
Creatinine, Ser: 0.76 mg/dL (ref 0.50–1.10)
GFR calc Af Amer: >90 mL/min (ref >90)
GFR calc non Af Amer: 81 mL/min — ABNORMAL LOW (ref >90)
Glucose, Bld: 119 mg/dL — ABNORMAL HIGH (ref 70–99)
Potassium: 4.1 mEq/L (ref 3.7–5.3)
Sodium: 138 mEq/L (ref 137–147)

## 2014-06-06 MED ORDER — FOLIC ACID 1 MG PO TABS
1.0000 mg | ORAL_TABLET | Freq: Every day | ORAL | Status: DC
Start: 2014-06-06 — End: 2017-04-12

## 2014-06-06 MED ORDER — ADULT MULTIVITAMIN W/MINERALS CH: 1.0000 | ORAL_TABLET | Freq: Every day | ORAL | Status: DC

## 2014-06-06 MED ORDER — ATENOLOL 25 MG PO TABS: 25.0000 mg | ORAL_TABLET | Freq: Every day | ORAL | Status: DC

## 2014-06-06 MED ORDER — ASPIRIN EC 325 MG PO TBEC: 325.0000 mg | DELAYED_RELEASE_TABLET | Freq: Every day | ORAL | Status: DC

## 2014-06-06 MED ORDER — BOOST PLUS PO LIQD: 237.0000 mL | Freq: Two times a day (BID) | ORAL | Status: DC

## 2014-06-06 MED ORDER — THIAMINE HCL 100 MG PO TABS: 100.0000 mg | ORAL_TABLET | Freq: Every day | ORAL | Status: DC

## 2014-06-06 MED ORDER — SIMVASTATIN 20 MG PO TABS
20.0000 mg | ORAL_TABLET | Freq: Every day | ORAL | Status: DC
Start: 2014-06-06 — End: 2017-04-12

## 2014-06-06 NOTE — Progress Notes (Signed)
STROKE TEAM PROGRESS NOTE   HISTORY [name] is an 73 y.o. female with HTN, hyperlipidemia, cerebral infarct left frontal lobe, paroxysmal atrial fibrillation, alcohol abuse, admitted to [facility] after sustaining a syncopal episode while at hair dresser washing her hair.  As part of the work up, a brain MRI was obtained and the study showed a small area of acute infarction right parietal white matter measuring approximately 3 x 7 mm.  MRA brain: mild to moderate multifocal intracranial atherosclerotic disease.  Patient is on aspirin.  [name] denies HA, vertigo, double vision, difficulty swallowing, focal weakness or numbness, slurred speech, language or vision impairment.   Date last known well: unable to determine  Time last known well: unable to determine  tPA Given: no, patient asymptomatic  NIHSS: 0   SUBJECTIVE (INTERVAL HISTORY) Her  Family is not at the bedside.  Overall she feels her condition is completely resolved. She cannot recall an incident has to happened but remembers being in the hairdresser sitting up and it was very hot. She woke up in the ambulance. She denies any focal neurological weakness or numbness.   OBJECTIVE Temp:  [97.4 F (36.3 C)-98.4 F (36.9 C)] 98 F (36.7 C) (10/24 0755) Pulse Rate:  [55-59] 55 (10/24 0755) Cardiac Rhythm:  [-]  Resp:  [16] 16 (10/24 0755) BP: (107-155)/(54-72) 143/63 mmHg (10/24 0755) SpO2:  [95 %-100 %] 100 % (10/24 0755)  No results found for this basename: GLUCAP,  in the last 168 hours  Recent Labs Lab 06/04/14 1631 06/04/14 1642 06/06/14 0341  NA 133* 133* 138  K 4.6 4.3 4.1  CL 94* 100 104  CO2 24  --  20  GLUCOSE 116* 119* 119*  BUN 8 7 14   CREATININE 0.67 0.80 0.76  CALCIUM 9.1  --  8.6  MG 2.0  --   --   PHOS 3.0  --   --     Recent Labs Lab 06/04/14 1631  AST 16  ALT 12  ALKPHOS 96  BILITOT 0.3  PROT 7.6  ALBUMIN 3.8    Recent Labs Lab 06/04/14 1631 06/04/14 1642 06/06/14 0341  WBC 6.5   --  7.3  NEUTROABS 4.3  --   --   HGB 16.0* 17.7* 14.0  HCT 47.4* 52.0* 42.8  MCV 91.3  --  94.7  PLT 298  --  262    Recent Labs Lab 06/04/14 2300 06/05/14 0318 06/05/14 1518  TROPONINI <0.30 <0.30 <0.30    Recent Labs  06/04/14 1631  LABPROT 13.3  INR 1.00    Recent Labs  06/04/14 1723  COLORURINE YELLOW  LABSPEC 1.006  PHURINE 5.0  GLUCOSEU NEGATIVE  HGBUR TRACE*  BILIRUBINUR NEGATIVE  KETONESUR NEGATIVE  PROTEINUR NEGATIVE  UROBILINOGEN 0.2  NITRITE NEGATIVE  LEUKOCYTESUR SMALL*       Component Value Date/Time   CHOL 180 06/05/2014 0314   TRIG 275* 06/05/2014 0314   HDL 41 06/05/2014 0314   CHOLHDL 4.4 06/05/2014 0314   VLDL 55* 06/05/2014 0314   LDLCALC 84 06/05/2014 0314   Lab Results  Component Value Date   HGBA1C 6.3* 06/05/2014      Component Value Date/Time   LABOPIA NONE DETECTED 06/04/2014 1723   COCAINSCRNUR NONE DETECTED 06/04/2014 1723   LABBENZ NONE DETECTED 06/04/2014 1723   AMPHETMU NONE DETECTED 06/04/2014 1723   THCU NONE DETECTED 06/04/2014 1723   LABBARB NONE DETECTED 06/04/2014 1723     Recent Labs Lab 06/04/14 [place]  43*    Dg Chest 2 View 06/04/2014    No active cardiopulmonary disease.     Ct Head Wo Contrast 06/04/2014    No evidence of acute intracranial abnormality. Atrophy with small vessel ischemic changes and intracranial atherosclerosis.  .     Ct Cervical Spine Wo Contrast 06/04/2014    No evidence of traumatic injury to the cervical spine. Mild multilevel degenerative changes.       Mr Brain Wo Contrast 06/05/2014    Small acute infarct right parietal white matter.      Mr Jodene Nam Head/brain Wo Cm 06/05/2014    Mild to moderate intracranial atherosclerotic disease.    PHYSICAL EXAM Frail elderly caucasian lady not in distress.Awake alert. Afebrile. Head is nontraumatic. Neck is supple without bruit. Hearing is normal. Cardiac exam no murmur or gallop. Lungs are clear to auscultation. Distal  pulses are well felt. Neurological Exam ;  Awake  Alert oriented x 3. Normal speech and language.eye movements full without nystagmus.fundi were not visualized. Vision acuity and fields appear normal. Hearing is normal. Palatal movements are normal. Face symmetric. Tongue midline. Normal strength, tone, reflexes and coordination. Normal sensation. Gait deferred. ASSESSMENT/PLAN [name] is a 73 y.o. female with history of hypertension, hyperlipidemia, previous strokes, paroxysmal atrial fibrillation, and alcohol abuse presenting with syncope. She did not receive IV t-PA due to resolution of deficits..   Stroke - Non dominant small right parietal white matter infarct secondary to silent emboli from atrial fibrillation.  MRI  Small acute infarct right parietal white matter.    MRA  Mild to moderate intracranial atherosclerotic disease.  Carotid Doppler - Preliminary report: 1-39% ICA stenosis. Vertebral artery flow is antegrade.   2D Echo - ejection fraction 60%. No cardiac source of emboli identified.  LDL - 84  HgbA1c - 6.3  Lovenox for VTE prophylaxis  Cardiac diet with thin liquids  Up with assistance  No antithrombotics prior to admission, now on aspirin 325 mg orally every day  Patient counseled to be compliant with her antithrombotic medications  Risk factor education  Ongoing aggressive risk factor management  Resultant - resolution of deficits  Therapy recommendations: SNF  Disposition:  Pending  Hypertension  Home meds: No antihypertensive medications prior to admission.  Stable without medication.  Permissive hypertension (OK if < 220/120) but gradually normalize in 5-7 days   Hyperlipidemia  Home meds: No cholesterol lowering medications prior to admission.  LDL 84 goal < 70  Now on simvastatin 20 mg daily  Continue statin at discharge   Other Stroke Risk Factors Advanced age ETOH use   Hx stroke/TIA   Family hx stroke (mother)    Atrial fibrillation - consider anticoagulation if transferred to a skilled nursing facility.Patient is poor long term anticoagulation candidate if unsupervised due to h/o alcoholism and noncomplaince. Aspirin 325 mg daily if she is going home.  Other Active Problems  Alcohol abuse  Elevated glucose levels  Other Pertinent History   Hospital day # 2 Lowry Ram [facility] Pager [phone] 06/06/2014, 12:09 PM  I have personally examined this patient, reviewed notes, independently viewed imaging studies, participated in medical decision making and plan of care. I have made any additions or clarifications directly to the above note. Agree with note above.   Antony Contras, MD Medical Director [facility] Pager: [phone] 06/06/2014 1:00 PM    To contact Stroke Continuity provider, please refer to [email or web address] After hours, contact General Neurology

## 2014-06-06 NOTE — Progress Notes (Signed)
VASCULAR LAB PRELIMINARY  PRELIMINARY  PRELIMINARY  PRELIMINARY  Carotid Dopplers completed.    Preliminary report:  1-39% ICA stenosis. Vertebral artery flow is antegrade.   Anas Reister, RVT 06/06/2014, 10:29 AM

## 2014-06-06 NOTE — Discharge Summary (Addendum)
Physician Discharge Summary  [name] JYN:[id number] DOB: November 21, 1940 DOA: 06/04/2014  PCP: Henrine Screws, MD  Admit date: 06/04/2014 Discharge date: 06/06/2014  Time spent: Less than 30 minutes  Recommendations for Outpatient Follow-up:  1. Dr. Josetta Huddle, PCP in 2 weeks. 2. No driving until cleared by MD during OP follow up.  Discharge Diagnoses:  Active Problems:   Syncope and collapse   Stroke   Alcohol dependence   Dyslipidemia   Discharge Condition: Improved & Stable  Diet recommendation: Heart Healthy diet.  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 No seizure activity reported. She complained of dizziness and lightheadedness prior to symptoms. She has no recollection of the event. She states that she has had 3-4 such episodes over the last year. She also gives history of sustaining couple of falls this year preceded by dizziness and lightheadedness without LOC. She consumes a shot of vodka twice a day and drinks a quart of beer daily  Hospital Course:   1. Syncope: Possibly recurrent. Unclear etiology. DD-alcohol intoxication, orthostatic hypotension, R/o Seizures and arrythmia's. Not sure if the small acute right parietal white matter infarct would explain her syncope. PT and OT evaluation-recommended SNF but patient and family declined and insisted on going home with son and daughter-in-law who are able to provide 24/7 supervision. No arrhythmias on monitor. Patient apparently has frequent dizzy spells and has sustained previous syncopal episodes and falls. She and family have been counseled that she should not drive until cleared by M.D. during outpatient followup. They verbalized understanding. Follow EEG results. 2. Small  acute right parietal white matter infarct: Incidentally seen on MRI brain. Likely secondary to embolic from PAF versus small vessel disease. Completed stroke workup. Not on antiplatelets prior to admission. Started aspirin 325 mg daily for secondary stroke prophylaxis. Although history of PAF, not a safe candidate for long-term anticoagulant use due to history of alcohol abuse, recurrent falls and medication noncompliance. No TPA due to unknown time of onset and no focal signs. Discussed with stroke M.D.-cleared for discharge home. 3. Alcohol dependence: Cessation counseled. Continue Ativan protocol. No features of withdrawal at this time. 4. Presumed UTI:  Although patient had been started on Rocephin (received 2 doses), patient denies urinary frequency, dysuria or fever PTA and urine microscopy was not very impressive. She will not be discharged on antibiotics. 5. Hypertension: Allow for permissive hypertension given new CVA. Continue atenolol.  6. Frequent falls: Alcohol abstinence counseled. PT and OT evaluation-discussion as above. 7. History of PAF: Currently in sinus bradycardia-improved after reducing her atenolol dose. 8. History of hyperlipidemia: Started statins. Goal LDL <70. LDL 84   Consultations:  Neurology  Procedures:  EEG: Discussed with reading neurologist-no epileptiform activity noted.   Discharge Exam:  Complaints:  Patient denies complaints and is anxious to go home  Filed Vitals:   06/06/14 0418 06/06/14 0755 06/06/14 1153 06/06/14 1200  BP:  143/63 143/81 134/69  Pulse:  55 60 64  Temp: 97.7 F (36.5 C) 98 F (36.7 C) 97.2 F (36.2 C)   TempSrc: Oral Oral Oral   Resp:  16 16   Height:      Weight:      SpO2: 99% 100% 98%    General exam: Pleasant elderly female lying comfortably  in bed.  Respiratory system: Clear. No increased work of breathing.  Cardiovascular system: S1 & S2 heard, RRR. No JVD, murmurs, gallops, clicks or pedal edema.Telemetry: Sinus  bradycardia in the 50s-sinus rhythm.  Gastrointestinal system: Abdomen is nondistended, soft and nontender. Normal bowel sounds heard.  Central nervous system: Alert and oriented. No focal neurological deficits.  Extremities: Symmetric 5 x 5 power.   Discharge Instructions      Discharge Instructions   Call MD for:  persistant dizziness or light-headedness    Complete by:  As directed      Call MD for:    Complete by:  As directed   Passing out episodes.     Diet - low sodium heart healthy    Complete by:  As directed      Driving Restrictions    Complete by:  As directed   NO driving until cleared by MD during out patient follow up.     Increase activity slowly    Complete by:  As directed             Medication List         ADVIL 200 MG tablet  Generic drug:  ibuprofen  Take 400 mg by mouth every 6 (six) hours as needed for fever, headache or mild pain.     aspirin EC 325 MG tablet  Take 1 tablet (325 mg total) by mouth daily.     atenolol 25 MG tablet  Commonly known as:  TENORMIN  Take 1 tablet (25 mg total) by mouth daily.     cholecalciferol 1000 UNITS tablet  Commonly known as:  VITAMIN D  Take 1,000 Units by mouth daily.     folic acid 1 MG tablet  Commonly known as:  FOLVITE  Take 1 tablet (1 mg total) by mouth daily.     lactose free nutrition Liqd  Take 237 mLs by mouth 2 (two) times daily between meals.     multivitamin with minerals Tabs tablet  Take 1 tablet by mouth daily.     simvastatin 20 MG tablet  Commonly known as:  ZOCOR  Take 1 tablet (20 mg total) by mouth daily at 6 PM.     thiamine 100 MG tablet  Take 1 tablet (100 mg total) by mouth daily.       Follow-up Information   Follow up with GATES,ROBERT NEVILL, MD. Schedule an appointment as soon as possible for a visit in 2 weeks.   Specialty:  Internal Medicine   Contact information:   [address] [place]  [address] [phone]        The results of  significant diagnostics from this hospitalization (including imaging, microbiology, ancillary and laboratory) are listed below for reference.    Significant Diagnostic Studies: Dg Chest 2 View  06/04/2014   CLINICAL DATA:  Status post fall, lost consciousness for 1 min  EXAM: CHEST  2 VIEW  COMPARISON:  08/29/2013  FINDINGS: There is left basilar airspace disease likely reflecting scarring. There is no focal parenchymal opacity, pleural effusion, or pneumothorax. The heart and mediastinal contours are unremarkable.  The osseous structures are unremarkable.  IMPRESSION: No active cardiopulmonary disease.   Electronically Signed   By: Kathreen Devoid   On: 06/04/2014 16:52   Ct Head Wo Contrast  06/04/2014   CLINICAL DATA:  Loss of consciousness  EXAM: CT HEAD WITHOUT CONTRAST  CT CERVICAL SPINE WITHOUT CONTRAST  TECHNIQUE: Multidetector CT imaging of the head  and cervical spine was performed following the standard protocol without intravenous contrast. Multiplanar CT image reconstructions of the cervical spine were also generated.  COMPARISON:  04/19/2006  FINDINGS: CT HEAD FINDINGS  No evidence of parenchymal hemorrhage or extra-axial fluid collection. No mass lesion, mass effect, or midline shift.  No CT evidence of acute infarction.  Subcortical white matter and periventricular small vessel ischemic changes. Left caudate lacunar infarct. Intracranial atherosclerosis.  The visualized paranasal sinuses are essentially clear. The mastoid air cells are unopacified.  No evidence of calvarial fracture.  CT CERVICAL SPINE FINDINGS  Normal cervical lordosis.  No evidence of fracture dislocation. Vertebral body heights are maintained. Dens appears intact.  No prevertebral soft tissue swelling.  Mild multilevel degenerative changes.  Visualized thyroid is unremarkable.  Visualized lung apices are notable for biapical pleural parenchymal scarring.  IMPRESSION: No evidence of acute intracranial abnormality. Atrophy with  small vessel ischemic changes and intracranial atherosclerosis.  No evidence of traumatic injury to the cervical spine. Mild multilevel degenerative changes.   Electronically Signed   By: Julian Hy M.D.   On: 06/04/2014 18:07   Ct Cervical Spine Wo Contrast  06/04/2014   CLINICAL DATA:  Loss of consciousness  EXAM: CT HEAD WITHOUT CONTRAST  CT CERVICAL SPINE WITHOUT CONTRAST  TECHNIQUE: Multidetector CT imaging of the head and cervical spine was performed following the standard protocol without intravenous contrast. Multiplanar CT image reconstructions of the cervical spine were also generated.  COMPARISON:  04/19/2006  FINDINGS: CT HEAD FINDINGS  No evidence of parenchymal hemorrhage or extra-axial fluid collection. No mass lesion, mass effect, or midline shift.  No CT evidence of acute infarction.  Subcortical white matter and periventricular small vessel ischemic changes. Left caudate lacunar infarct. Intracranial atherosclerosis.  The visualized paranasal sinuses are essentially clear. The mastoid air cells are unopacified.  No evidence of calvarial fracture.  CT CERVICAL SPINE FINDINGS  Normal cervical lordosis.  No evidence of fracture dislocation. Vertebral body heights are maintained. Dens appears intact.  No prevertebral soft tissue swelling.  Mild multilevel degenerative changes.  Visualized thyroid is unremarkable.  Visualized lung apices are notable for biapical pleural parenchymal scarring.  IMPRESSION: No evidence of acute intracranial abnormality. Atrophy with small vessel ischemic changes and intracranial atherosclerosis.  No evidence of traumatic injury to the cervical spine. Mild multilevel degenerative changes.   Electronically Signed   By: Julian Hy M.D.   On: 06/04/2014 18:07   Mr Brain Wo Contrast  06/05/2014   CLINICAL DATA:  Syncope  EXAM: MRI HEAD WITHOUT CONTRAST  MRA HEAD WITHOUT CONTRAST  TECHNIQUE: Multiplanar, multiecho pulse sequences of the brain and  surrounding structures were obtained without intravenous contrast. Angiographic images of the head were obtained using MRA technique without contrast.  COMPARISON:  CT 06/04/2014  FINDINGS: MRI HEAD FINDINGS  Small area of acute infarction right parietal white matter measuring approximately 3 x 7 mm. No other area of acute infarct is identified.  Generalized atrophy. Mild chronic microvascular ischemic change in the white matter. Negative for cortical infarct. Brainstem and cerebellum intact.  Image quality degraded by motion on several sequences.  Negative for intracranial hemorrhage. Negative for mass or edema. No shift of the midline structures.  MRA HEAD FINDINGS  Image quality degraded by motion.  The right vertebral artery is small and ends in PICA. Left vertebral artery is small with a severe stenosis at the vertebrobasilar junction. Small basilar artery ends in the superior cerebellar artery bilaterally which are patent. Fetal  origin of the posterior cerebral artery bilaterally with hypoplastic P1 segments bilaterally. Diffuse atherosclerotic disease in the posterior cerebral arteries with multiple areas of mild to moderate stenosis.  Cavernous carotid is patent bilaterally. Mild atherosclerotic disease. Mild atherosclerotic irregularity in the middle cerebral artery branches bilaterally. Moderate stenosis right A1 segment. Mild disease in the A2 segments bilaterally.  Negative for cerebral aneurysm or vascular malformation.  IMPRESSION: Small acute infarct right parietal white matter.  Mild to moderate intracranial atherosclerotic disease as described above.   Electronically Signed   By: Franchot Gallo M.D.   On: 06/05/2014 10:02   Mr Jodene Nam Head/brain Wo Cm  06/05/2014   CLINICAL DATA:  Syncope  EXAM: MRI HEAD WITHOUT CONTRAST  MRA HEAD WITHOUT CONTRAST  TECHNIQUE: Multiplanar, multiecho pulse sequences of the brain and surrounding structures were obtained without intravenous contrast. Angiographic images  of the head were obtained using MRA technique without contrast.  COMPARISON:  CT 06/04/2014  FINDINGS: MRI HEAD FINDINGS  Small area of acute infarction right parietal white matter measuring approximately 3 x 7 mm. No other area of acute infarct is identified.  Generalized atrophy. Mild chronic microvascular ischemic change in the white matter. Negative for cortical infarct. Brainstem and cerebellum intact.  Image quality degraded by motion on several sequences.  Negative for intracranial hemorrhage. Negative for mass or edema. No shift of the midline structures.  MRA HEAD FINDINGS  Image quality degraded by motion.  The right vertebral artery is small and ends in PICA. Left vertebral artery is small with a severe stenosis at the vertebrobasilar junction. Small basilar artery ends in the superior cerebellar artery bilaterally which are patent. Fetal origin of the posterior cerebral artery bilaterally with hypoplastic P1 segments bilaterally. Diffuse atherosclerotic disease in the posterior cerebral arteries with multiple areas of mild to moderate stenosis.  Cavernous carotid is patent bilaterally. Mild atherosclerotic disease. Mild atherosclerotic irregularity in the middle cerebral artery branches bilaterally. Moderate stenosis right A1 segment. Mild disease in the A2 segments bilaterally.  Negative for cerebral aneurysm or vascular malformation.  IMPRESSION: Small acute infarct right parietal white matter.  Mild to moderate intracranial atherosclerotic disease as described above.   Electronically Signed   By: Franchot Gallo M.D.   On: 06/05/2014 10:02    Microbiology: No results found for this or any previous visit (from the past 240 hour(s)).   Labs: Basic Metabolic Panel:  Recent Labs Lab 06/04/14 1631 06/04/14 1642 06/06/14 0341  NA 133* 133* 138  K 4.6 4.3 4.1  CL 94* 100 104  CO2 24  --  20  GLUCOSE 116* 119* 119*  BUN 8 7 14   CREATININE 0.67 0.80 0.76  CALCIUM 9.1  --  8.6  MG 2.0  --    --   PHOS 3.0  --   --    Liver Function Tests:  Recent Labs Lab 06/04/14 1631  AST 16  ALT 12  ALKPHOS 96  BILITOT 0.3  PROT 7.6  ALBUMIN 3.8   No results found for this basename: LIPASE, AMYLASE,  in the last 168 hours  Recent Labs Lab 06/04/14 1631  AMMONIA 21   CBC:  Recent Labs Lab 06/04/14 1631 06/04/14 1642 06/06/14 0341  WBC 6.5  --  7.3  NEUTROABS 4.3  --   --   HGB 16.0* 17.7* 14.0  HCT 47.4* 52.0* 42.8  MCV 91.3  --  94.7  PLT 298  --  262   Cardiac Enzymes:  Recent Labs Lab 06/04/14  2300 06/05/14 0318 06/05/14 1518  TROPONINI <0.30 <0.30 <0.30   BNP: BNP (last 3 results) No results found for this basename: PROBNP,  in the last 8760 hours CBG: No results found for this basename: GLUCAP,  in the last 168 hours  Additional labs: 1. Fasting lipids: Cholesterol 180, triglycerides 275, HDL 41, LDL 84 and VLDL 55 2. A1c: 6.3 3. Blood alcohol level on admission: 62 4. UDS: Negative 5. 2-D echo 06/05/14: Study Conclusions  - Left ventricle: Upper septal thickening, but no LVOT gradient. No SAM of the mitral valve. The cavity size was normal. Wall thickness was increased in a pattern of mild LVH. The estimated ejection fraction was 60%. Wall motion was normal; there were no regional wall motion abnormalities. - Right ventricle: The cavity size was normal. Systolic function was normal. 6. Carotid Dopplers completed.  Preliminary report: 1-39% ICA stenosis. Vertebral artery flow is antegrade.       Signed:  Vernell Leep, MD, FACP, FHM. [facility] Pager [phone]  If 7PM-7AM, please contact night-coverage [email or web address] Password [facility] 06/06/2014, 3:17 PM   Addendum  Dietician consulted on 06/05/2014 and indicated that patient met criteria for Severe malnutrition in the context of chronic illness. Mx per dietician recommendations.  Vernell Leep, MD, FACP, FHM. [facility] Pager [phone]  If 7PM-7AM, please  contact night-coverage [email or web address] Password [facility] 06/15/2014, 8:39 PM'

## 2014-06-06 NOTE — Discharge Instructions (Signed)
Syncope °Syncope is a medical term for fainting or passing out. This means you lose consciousness and drop to the ground. People are generally unconscious for less than 5 minutes. You may have some muscle twitches for up to 15 seconds before waking up and returning to normal. Syncope occurs more often in older adults, but it can happen to anyone. While most causes of syncope are not dangerous, syncope can be a sign of a serious medical problem. It is important to seek medical care.  °CAUSES  °Syncope is caused by a sudden drop in blood flow to the brain. The specific cause is often not determined. Factors that can bring on syncope include: °· Taking medicines that lower blood pressure. °· Sudden changes in posture, such as standing up quickly. °· Taking more medicine than prescribed. °· Standing in one place for too long. °· Seizure disorders. °· Dehydration and excessive exposure to heat. °· Low blood sugar (hypoglycemia). °· Straining to have a bowel movement. °· Heart disease, irregular heartbeat, or other circulatory problems. °· Fear, emotional distress, seeing blood, or severe pain. °SYMPTOMS  °Right before fainting, you may: °· Feel dizzy or light-headed. °· Feel nauseous. °· See all white or all black in your field of vision. °· Have cold, clammy skin. °DIAGNOSIS  °Your health care provider will ask about your symptoms, perform a physical exam, and perform an electrocardiogram (ECG) to record the electrical activity of your heart. Your health care provider may also perform other heart or blood tests to determine the cause of your syncope which may include: °· Transthoracic echocardiogram (TTE). During echocardiography, sound waves are used to evaluate how blood flows through your heart. °· Transesophageal echocardiogram (TEE). °· Cardiac monitoring. This allows your health care provider to monitor your heart rate and rhythm in real time. °· Holter monitor. This is a portable device that records your  heartbeat and can help diagnose heart arrhythmias. It allows your health care provider to track your heart activity for several days, if needed. °· Stress tests by exercise or by giving medicine that makes the heart beat faster. °TREATMENT  °In most cases, no treatment is needed. Depending on the cause of your syncope, your health care provider may recommend changing or stopping some of your medicines. °HOME CARE INSTRUCTIONS °· Have someone stay with you until you feel stable. °· Do not drive, use machinery, or play sports until your health care provider says it is okay. °· Keep all follow-up appointments as directed by your health care provider. °· Lie down right away if you start feeling like you might faint. Breathe deeply and steadily. Wait until all the symptoms have passed. °· Drink enough fluids to keep your urine clear or pale yellow. °· If you are taking blood pressure or heart medicine, get up slowly and take several minutes to sit and then stand. This can reduce dizziness. °SEEK IMMEDIATE MEDICAL CARE IF:  °· You have a severe headache. °· You have unusual pain in the chest, abdomen, or back. °· You are bleeding from your mouth or rectum, or you have black or tarry stool. °· You have an irregular or very fast heartbeat. °· You have pain with breathing. °· You have repeated fainting or seizure-like jerking during an episode. °· You faint when sitting or lying down. °· You have confusion. °· You have trouble walking. °· You have severe weakness. °· You have vision problems. °If you fainted, call your local emergency services (911 in [place] Do not drive   yourself to the hospital.  MAKE SURE YOU:  Understand these instructions.  Will watch your condition.  Will get help right away if you are not doing well or get worse. Document Released: 07/31/2005 Document Revised: 08/05/2013 Document Reviewed: 09/29/2011 [facility] Patient Information 2015 [place], [place]. This information is not intended to replace  advice given to you by your health care provider. Make sure you discuss any questions you have with your health care provider.   Ischemic Stroke A stroke (cerebrovascular accident) is the sudden death of brain tissue. It is a medical emergency. A stroke can cause permanent loss of brain function. This can cause problems with different parts of your body. A transient ischemic attack (TIA) is different because it does not cause permanent damage. A TIA is a short-lived problem of poor blood flow affecting a part of the brain. A TIA is also a serious problem because having a TIA greatly increases the chances of having a stroke. When symptoms first develop, you cannot know if the problem might be a stroke or a TIA. CAUSES  A stroke is caused by a decrease of oxygen supply to an area of your brain. It is usually the result of a small blood clot or collection of cholesterol or fat (plaque) that blocks blood flow in the brain. A stroke can also be caused by blocked or damaged carotid arteries.  RISK FACTORS  High blood pressure (hypertension).  High cholesterol.  Diabetes mellitus.  Heart disease.  The buildup of plaque in the blood vessels (peripheral artery disease or atherosclerosis).  The buildup of plaque in the blood vessels providing blood and oxygen to the brain (carotid artery stenosis).  An abnormal heart rhythm (atrial fibrillation).  Obesity.  Smoking.  Taking oral contraceptives (especially in combination with smoking).  Physical inactivity.  A diet high in fats, salt (sodium), and calories.  Alcohol use.  Use of illegal drugs (especially cocaine and methamphetamine).  Being African American.  Being over the age of 31.  Family history of stroke.  Previous history of blood clots, stroke, TIA, or heart attack.  Sickle cell disease. SYMPTOMS  These symptoms usually develop suddenly, or may be newly present upon awakening from sleep:  Sudden weakness or numbness of  the face, arm, or leg, especially on one side of the body.  Sudden trouble walking or difficulty moving arms or legs.  Sudden confusion.  Sudden personality changes.  Trouble speaking (aphasia) or understanding.  Difficulty swallowing.  Sudden trouble seeing in one or both eyes.  Double vision.  Dizziness.  Loss of balance or coordination.  Sudden severe headache with no known cause.  Trouble reading or writing. DIAGNOSIS  Your health care provider can often determine the presence or absence of a stroke based on your symptoms, history, and physical exam. Computed tomography (CT) of the brain is usually performed to confirm the stroke, determine causes, and determine stroke severity. Other tests may be done to find the cause of the stroke. These tests may include:  Electrocardiography.  Continuous heart monitoring.  Echocardiography.  Carotid ultrasonography.  Magnetic resonance imaging (MRI).  A scan of the brain circulation.  Blood tests. PREVENTION  The risk of a stroke can be decreased by appropriately treating high blood pressure, high cholesterol, diabetes, heart disease, and obesity and by quitting smoking, limiting alcohol, and staying physically active. TREATMENT  Time is of the essence. It is important to seek treatment at the first sign of these symptoms because you may receive a medicine  to dissolve the clot (thrombolytic) that cannot be given if too much time has passed since your symptoms began. Even if you do not know when your symptoms began, get treatment as soon as possible as there are other treatment options available including oxygen, intravenous (IV) fluids, and medicines to thin the blood (anticoagulants). Treatment of stroke depends on the duration, severity, and cause of your symptoms. Medicines and dietary changes may be used to address diabetes, high blood pressure, and other risk factors. Physical, speech, and occupational therapists will assess  you and work with you to improve any functions impaired by the stroke. Measures will be taken to prevent short-term and long-term complications, including infection from breathing foreign material into the lungs (aspiration pneumonia), blood clots in the legs, bedsores, and falls. Rarely, surgery may be needed to remove large blood clots or to open up blocked arteries. HOME CARE INSTRUCTIONS   Take medicines only as directed by your health care provider. Follow the directions carefully. Medicines may be used to control risk factors for a stroke. Be sure you understand all your medicine instructions.  You may be told to take a medicine to thin the blood, such as aspirin or the anticoagulant warfarin. Warfarin needs to be taken exactly as instructed.  Too much and too little warfarin are both dangerous. Too much warfarin increases the risk of bleeding. Too little warfarin continues to allow the risk for blood clots. While taking warfarin, you will need to have regular blood tests to measure your blood clotting time. These blood tests usually include both the PT and INR tests. The PT and INR results allow your health care provider to adjust your dose of warfarin. The dose can change for many reasons. It is critically important that you take warfarin exactly as prescribed, and that you have your PT and INR levels drawn exactly as directed.  Many foods, especially foods high in vitamin K, can interfere with warfarin and affect the PT and INR results. Foods high in vitamin K include spinach, kale, broccoli, cabbage, collard and turnip greens, brussels sprouts, peas, cauliflower, seaweed, and parsley, as well as beef and pork liver, green tea, and soybean oil. You should eat a consistent amount of foods high in vitamin K. Avoid major changes in your diet, or notify your health care provider before changing your diet. Arrange a visit with a dietitian to answer your questions.  Many medicines can interfere with  warfarin and affect the PT and INR results. You must tell your health care provider about any and all medicines you take. This includes all vitamins and supplements. Be especially cautious with aspirin and anti-inflammatory medicines. Do not take or discontinue any prescribed or over-the-counter medicine except on the advice of your health care provider or pharmacist.  Warfarin can have side effects, such as excessive bruising or bleeding. You will need to hold pressure over cuts for longer than usual. Your health care provider or pharmacist will discuss other potential side effects.  Avoid sports or activities that may cause injury or bleeding.  Be mindful when shaving, flossing your teeth, or handling sharp objects.  Alcohol can change the body's ability to handle warfarin. It is best to avoid alcoholic drinks or consume only very small amounts while taking warfarin. Notify your health care provider if you change your alcohol intake.  Notify your dentist or other health care providers before procedures.  If swallow studies have determined that your swallowing reflex is present, you should eat healthy foods. Including  5 or more servings of fruits and vegetables a day may reduce the risk of stroke. Foods may need to be a certain consistency (soft or pureed), or small bites may need to be taken in order to avoid aspirating or choking. Certain dietary changes may be advised to address high blood pressure, high cholesterol, diabetes, or obesity.  Food choices that are low in sodium, saturated fat, trans fat, and cholesterol are recommended to manage high blood pressure.  Food choies that are high in fiber, and low in saturated fat, trans fat, and cholesterol may control cholesterol levels.  Controlling carbohydrates and sugar intake is recommended to manage diabetes.  Reducing calorie intake and making food choices that are low in sodium, saturated fat, trans fat, and cholesterol are recommended to  manage obesity.  Maintain a healthy weight.  Stay physically active. It is recommended that you get at least 30 minutes of activity on all or most days.  Do not use any tobacco products including cigarettes, chewing tobacco, or electronic cigarettes.  Limit alcohol use even if you are not taking warfarin. Moderate alcohol use is considered to be:  No more than 2 drinks each day for men.  No more than 1 drink each day for nonpregnant women.  Home safety. A safe home environment is important to reduce the risk of falls. Your health care provider may arrange for specialists to evaluate your home. Having grab bars in the bedroom and bathroom is often important. Your health care provider may arrange for equipment to be used at home, such as raised toilets and a seat for the shower.  Physical, occupational, and speech therapy. Ongoing therapy may be needed to maximize your recovery after a stroke. If you have been advised to use a walker or a cane, use it at all times. Be sure to keep your therapy appointments.  Follow all instructions for follow-up with your health care provider. This is very important. This includes any referrals, physical therapy, rehabilitation, and lab tests. Proper follow-up can prevent another stroke from occurring. SEEK MEDICAL CARE IF:  You have personality changes.  You have difficulty swallowing.  You are seeing double.  You have dizziness.  You have a fever.  You have skin breakdown. SEEK IMMEDIATE MEDICAL CARE IF:  Any of these symptoms may represent a serious problem that is an emergency. Do not wait to see if the symptoms will go away. Get medical help right away. Call your local emergency services (911 in [place] Do not drive yourself to the hospital.  You have sudden weakness or numbness of the face, arm, or leg, especially on one side of the body.  You have sudden trouble walking or difficulty moving arms or legs.  You have sudden confusion.  You  have trouble speaking (aphasia) or understanding.  You have sudden trouble seeing in one or both eyes.  You have a loss of balance or coordination.  You have a sudden, severe headache with no known cause.  You have new chest pain or an irregular heartbeat.  You have a partial or total loss of consciousness. Document Released: 07/31/2005 Document Revised: 12/15/2013 Document Reviewed: 03/10/2012 [facility] Patient Information 2015 [place], [place]. This information is not intended to replace advice given to you by your health care provider. Make sure you discuss any questions you have with your health care provider.

## 2014-09-05 ENCOUNTER — Emergency Department (HOSPITAL_COMMUNITY): Payer: Medicare Other

## 2014-09-05 ENCOUNTER — Encounter (HOSPITAL_COMMUNITY): Payer: Self-pay | Admitting: Radiology

## 2014-09-05 ENCOUNTER — Emergency Department (HOSPITAL_COMMUNITY)
Admission: EM | Admit: 2014-09-05 | Discharge: 2014-09-05 | Disposition: A | Discharge status: Home / Self Care | Payer: Medicare Other | Attending: Emergency Medicine | Admitting: Emergency Medicine

## 2014-09-05 DIAGNOSIS — Z8719 Personal history of other diseases of the digestive system: Secondary | ICD-10-CM | POA: Diagnosis not present

## 2014-09-05 DIAGNOSIS — E785 Hyperlipidemia, unspecified: Secondary | ICD-10-CM | POA: Diagnosis not present

## 2014-09-05 DIAGNOSIS — M109 Gout, unspecified: Secondary | ICD-10-CM | POA: Diagnosis not present

## 2014-09-05 DIAGNOSIS — Z7982 Long term (current) use of aspirin: Secondary | ICD-10-CM | POA: Diagnosis not present

## 2014-09-05 DIAGNOSIS — I639 Cerebral infarction, unspecified: Secondary | ICD-10-CM | POA: Diagnosis not present

## 2014-09-05 DIAGNOSIS — Z8781 Personal history of (healed) traumatic fracture: Secondary | ICD-10-CM | POA: Insufficient documentation

## 2014-09-05 DIAGNOSIS — E559 Vitamin D deficiency, unspecified: Secondary | ICD-10-CM | POA: Diagnosis not present

## 2014-09-05 DIAGNOSIS — R51 Headache: Secondary | ICD-10-CM | POA: Diagnosis not present

## 2014-09-05 DIAGNOSIS — Z8742 Personal history of other diseases of the female genital tract: Secondary | ICD-10-CM | POA: Diagnosis not present

## 2014-09-05 DIAGNOSIS — Z79899 Other long term (current) drug therapy: Secondary | ICD-10-CM | POA: Diagnosis not present

## 2014-09-05 DIAGNOSIS — R200 Anesthesia of skin: Secondary | ICD-10-CM | POA: Insufficient documentation

## 2014-09-05 DIAGNOSIS — Z8673 Personal history of transient ischemic attack (TIA), and cerebral infarction without residual deficits: Secondary | ICD-10-CM | POA: Diagnosis not present

## 2014-09-05 DIAGNOSIS — Z8659 Personal history of other mental and behavioral disorders: Secondary | ICD-10-CM | POA: Diagnosis not present

## 2014-09-05 DIAGNOSIS — J984 Other disorders of lung: Secondary | ICD-10-CM | POA: Diagnosis not present

## 2014-09-05 DIAGNOSIS — E538 Deficiency of other specified B group vitamins: Secondary | ICD-10-CM | POA: Insufficient documentation

## 2014-09-05 DIAGNOSIS — I10 Essential (primary) hypertension: Secondary | ICD-10-CM | POA: Diagnosis not present

## 2014-09-05 DIAGNOSIS — R0602 Shortness of breath: Secondary | ICD-10-CM | POA: Insufficient documentation

## 2014-09-05 DIAGNOSIS — R519 Headache, unspecified: Secondary | ICD-10-CM

## 2014-09-05 LAB — CBC WITH DIFFERENTIAL/PLATELET
BASOS ABS: 0 10*3/uL (ref 0.0–0.1)
Basophils Relative: 1 % (ref 0–1)
Eosinophils Absolute: 0.2 10*3/uL (ref 0.0–0.7)
Eosinophils Relative: 3 % (ref 0–5)
HCT: 46.6 % — ABNORMAL HIGH (ref 36.0–46.0)
Hemoglobin: 15.3 g/dL — ABNORMAL HIGH (ref 12.0–15.0)
Lymphocytes Relative: 25 % (ref 12–46)
Lymphs Abs: 2 10*3/uL (ref 0.7–4.0)
MCH: 31.1 pg (ref 26.0–34.0)
MCHC: 32.8 g/dL (ref 30.0–36.0)
MCV: 94.7 fL (ref 78.0–100.0)
Monocytes Absolute: 0.5 10*3/uL (ref 0.1–1.0)
Monocytes Relative: 6 % (ref 3–12)
NEUTROS ABS: 5.3 10*3/uL (ref 1.7–7.7)
NEUTROS PCT: 65 % (ref 43–77)
Platelets: 270 10*3/uL (ref 150–400)
RBC: 4.92 MIL/uL (ref 3.87–5.11)
RDW: 12.7 % (ref 11.5–15.5)
WBC: 8.1 10*3/uL (ref 4.0–10.5)

## 2014-09-05 LAB — I-STAT CHEM 8, ED
BUN: 7 mg/dL (ref 6–23)
Calcium, Ion: 1.16 mmol/L (ref 1.13–1.30)
Chloride: 100 mmol/L (ref 96–112)
Creatinine, Ser: 0.7 mg/dL (ref 0.50–1.10)
GLUCOSE: 124 mg/dL — AB (ref 70–99)
HEMATOCRIT: 50 % — AB (ref 36.0–46.0)
HEMOGLOBIN: 17 g/dL — AB (ref 12.0–15.0)
Potassium: 3.9 mmol/L (ref 3.5–5.1)
Sodium: 136 mmol/L (ref 135–145)
TCO2: 25 mmol/L (ref 0–100)

## 2014-09-05 LAB — I-STAT TROPONIN, ED: Troponin i, poc: 0 ng/mL (ref 0.00–0.08)

## 2014-09-05 MED ORDER — DIPHENHYDRAMINE HCL 50 MG/ML IJ SOLN
25.0000 mg | Freq: Once | INTRAMUSCULAR | Status: AC
  Administered 2014-09-05: 25 mg via INTRAVENOUS
  Filled 2014-09-05: qty 1

## 2014-09-05 MED ORDER — MORPHINE SULFATE 4 MG/ML IJ SOLN
4.0000 mg | Freq: Once | INTRAMUSCULAR | Status: AC
  Administered 2014-09-05: 4 mg via INTRAVENOUS
  Filled 2014-09-05: qty 1

## 2014-09-05 MED ORDER — SODIUM CHLORIDE 0.9 % IV BOLUS (SEPSIS)
1000.0000 mL | Freq: Once | INTRAVENOUS | Status: AC
  Administered 2014-09-05: 1000 mL via INTRAVENOUS

## 2014-09-05 NOTE — Discharge Instructions (Signed)

## 2014-09-05 NOTE — ED Notes (Signed)
Per patients son pt has had poor intake for the past three days and is now c/o a headache.

## 2014-09-05 NOTE — ED Notes (Signed)
Hold lab draw at this time per RN.

## 2014-09-05 NOTE — ED Provider Notes (Signed)
CSN: [id number]     Arrival date & time 09/05/14  1540 History   First MD Initiated Contact with Patient 09/05/14 1551     Chief Complaint  Patient presents with  . Headache     (Consider location/radiation/quality/duration/timing/severity/associated sxs/prior Treatment) HPI Comments: Patient with past medical history of stroke, hypertension, and alcohol abuse, presents to the emergency department with chief complaint of headache. She states that she has had a headache for the past 3 days. She states that it is progressively worsening. She denies any weakness, or slurred speech. She states that she has had some numbness in bilateral fingertips. There are no aggravating or alleviating factors. Patient has tried taking aspirin with no relief. She was recently admitted to the hospital in October of last year for a syncopal episode, which revealed a incidental stroke on MRI. She was not started on long-term anti-coagulants because of alcohol abuse and recurrent falls. Patient states that this headache did not have any precipitating events. She states that the pain is constant and moderate in severity. She denies any fevers, chills, or chest pain. She states that she has had some slight shortness of breath, but denies any cough. She denies any other symptoms at this time.  The history is provided by the patient. No language interpreter was used.    Past Medical History  Diagnosis Date  . Hypertension   . Gout   . Closed left hip fracture 5/14  . Alcohol abuse   . GERD (gastroesophageal reflux disease)   . Vitamin D deficiency   . Vitamin B12 deficiency   . Depression   . Cerebrovascular accident (stroke) 2/07    left frontal lobe  . Dupuytren's contracture   . Bacterial vaginosis   . Hyperlipidemia LDL goal <70   . Fibrocystic breast disease   . Paroxysmal a-fib   . Renal calculus   . Fracture of metatarsal bone of right foot     5th metatarsal   Past Surgical History  Procedure  Laterality Date  . Leg surgery    . Wrist surgery      left wrist  . Hip arthroplasty Left 12/17/2012    Procedure: LEFT HIP HEMI ARTHROPLASTY ;  Surgeon: Rozanna Box, MD;  Location: [facility];  Service: Orthopedics;  Laterality: Left;   Family History  Problem Relation Age of Onset  . Stroke Mother    History  Substance Use Topics  . Smoking status: Never Smoker   . Smokeless tobacco: Not on file  . Alcohol Use: 8.4 oz/week    14 Cans of beer per week     Comment: daily   OB History    No data available     Review of Systems  Constitutional: Negative for fever and chills.  Respiratory: Positive for shortness of breath.   Cardiovascular: Negative for chest pain.  Gastrointestinal: Negative for nausea, vomiting, diarrhea and constipation.  Genitourinary: Negative for dysuria.  Neurological: Positive for headaches.  All other systems reviewed and are negative.     Allergies  Ambien  Home Medications   Prior to Admission medications   Medication Sig Start Date End Date Taking? Authorizing Provider  aspirin EC 325 MG tablet Take 1 tablet (325 mg total) by mouth daily. 06/06/14   Modena Jansky, MD  atenolol (TENORMIN) 25 MG tablet Take 1 tablet (25 mg total) by mouth daily. 06/06/14   Modena Jansky, MD  cholecalciferol (VITAMIN D) 1000 UNITS tablet Take 1,000 Units by mouth daily.  Historical Provider, MD  folic acid (FOLVITE) 1 MG tablet Take 1 tablet (1 mg total) by mouth daily. 06/06/14   Modena Jansky, MD  ibuprofen (ADVIL) 200 MG tablet Take 400 mg by mouth every 6 (six) hours as needed for fever, headache or mild pain.    Historical Provider, MD  lactose free nutrition (BOOST PLUS) LIQD Take 237 mLs by mouth 2 (two) times daily between meals. 06/06/14   Modena Jansky, MD  Multiple Vitamin (MULTIVITAMIN WITH MINERALS) TABS tablet Take 1 tablet by mouth daily. 06/06/14   Modena Jansky, MD  simvastatin (ZOCOR) 20 MG tablet Take 1 tablet (20 mg total) by  mouth daily at 6 PM. 06/06/14   Modena Jansky, MD  thiamine 100 MG tablet Take 1 tablet (100 mg total) by mouth daily. 06/06/14   Modena Jansky, MD   BP 142/75 mmHg  Pulse 60  Temp(Src) 97.4 F (36.3 C) (Oral)  Resp 18  SpO2 95% Physical Exam  Constitutional: She is oriented to person, place, and time. She appears well-developed and well-nourished.  HENT:  Head: Normocephalic and atraumatic.  Right Ear: External ear normal.  Left Ear: External ear normal.  Eyes: Conjunctivae and EOM are normal. Pupils are equal, round, and reactive to light.  Neck: Normal range of motion. Neck supple.  No pain with neck flexion, no meningismus  Cardiovascular: Normal rate, regular rhythm and normal heart sounds.  Exam reveals no gallop and no friction rub.   No murmur heard. Pulmonary/Chest: Effort normal and breath sounds normal. No respiratory distress. She has no wheezes. She has no rales. She exhibits no tenderness.  Abdominal: Soft. She exhibits no distension and no mass. There is no tenderness. There is no rebound and no guarding.  Musculoskeletal: Normal range of motion. She exhibits no edema or tenderness.  Normal gait.  Neurological: She is alert and oriented to person, place, and time. She has normal reflexes.  CN 3-12 intact, normal finger to nose, no pronator drift, sensation and strength intact bilaterally.  Skin: Skin is warm and dry.  Psychiatric: She has a normal mood and affect. Her behavior is normal. Judgment and thought content normal.  Nursing note and vitals reviewed.   ED Course  Procedures (including critical care time) Results for orders placed or performed during the hospital encounter of 09/05/14  CBC with Differential/Platelet  Result Value Ref Range   WBC 8.1 4.0 - 10.5 K/uL   RBC 4.92 3.87 - 5.11 MIL/uL   Hemoglobin 15.3 (H) 12.0 - 15.0 g/dL   HCT 46.6 (H) 36.0 - 46.0 %   MCV 94.7 78.0 - 100.0 fL   MCH 31.1 26.0 - 34.0 pg   MCHC 32.8 30.0 - 36.0 g/dL    RDW 12.7 11.5 - 15.5 %   Platelets 270 150 - 400 K/uL   Neutrophils Relative % 65 43 - 77 %   Neutro Abs 5.3 1.7 - 7.7 K/uL   Lymphocytes Relative 25 12 - 46 %   Lymphs Abs 2.0 0.7 - 4.0 K/uL   Monocytes Relative 6 3 - 12 %   Monocytes Absolute 0.5 0.1 - 1.0 K/uL   Eosinophils Relative 3 0 - 5 %   Eosinophils Absolute 0.2 0.0 - 0.7 K/uL   Basophils Relative 1 0 - 1 %   Basophils Absolute 0.0 0.0 - 0.1 K/uL  I-stat chem 8, ed  Result Value Ref Range   Sodium 136 135 - 145 mmol/L   Potassium 3.9  3.5 - 5.1 mmol/L   Chloride 100 96 - 112 mmol/L   BUN 7 6 - 23 mg/dL   Creatinine, Ser 0.70 0.50 - 1.10 mg/dL   Glucose, Bld 124 (H) 70 - 99 mg/dL   Calcium, Ion 1.16 1.13 - 1.30 mmol/L   TCO2 25 0 - 100 mmol/L   Hemoglobin 17.0 (H) 12.0 - 15.0 g/dL   HCT 50.0 (H) 36.0 - 46.0 %  I-Stat Troponin, ED (not at [facility])  Result Value Ref Range   Troponin i, poc 0.00 0.00 - 0.08 ng/mL   Comment 3           Dg Chest 2 View  09/05/2014   CLINICAL DATA:  Headache for 3 days. History of hypertension. Nonsmoker.  EXAM: CHEST  2 VIEW  COMPARISON:  06/04/2014  FINDINGS: Cardiac silhouette normal in size and configuration. Aorta is mildly uncoiled. No mediastinal or hilar masses or evidence of adenopathy. There is stable scarring in the upper lobes at the apices. Lungs are otherwise clear. No pleural effusion or pneumothorax.  Bony thorax is demineralized but intact.  IMPRESSION: No acute cardiopulmonary disease.   Electronically Signed   By: Lajean Manes M.D.   On: 09/05/2014 16:51   Ct Head Wo Contrast  09/05/2014   CLINICAL DATA:  Headache, poor intake last 3 days  EXAM: CT HEAD WITHOUT CONTRAST  TECHNIQUE: Contiguous axial images were obtained from the base of the skull through the vertex without intravenous contrast.  COMPARISON:  06/04/2014  FINDINGS: No skull fracture is noted. Paranasal sinuses and mastoid air cells are unremarkable. No intracranial hemorrhage, mass effect or midline shift. Stable small  lacunar infarct in left caudate nucleus. Stable atrophy and chronic white matter disease. No definite acute cortical infarction. No mass lesion is noted on this unenhanced scan.  IMPRESSION: No acute intracranial abnormality. No definite acute cortical infarctions. Stable atrophy and chronic white matter disease. Stable small lacunar infarct in left caudate nucleus.   Electronically Signed   By: Lahoma Crocker M.D.   On: 09/05/2014 17:02      EKG Interpretation None      MDM   Final diagnoses:  Headache    Patient with headache 3 days. Will check head CT, and labs. No definite neurovascular deficits on exam. She does however complain of some subjective numbness in bilateral fingertips.  5:33 PM Patient states pain is improved.  8/10-> now 3/10.  Patient seen by and discussed with Dr. Wilson Singer, who agrees with plan for discharge.  Imaging and labs are reassuring.  Neurovascularly intact.  Well-appearing.  Discharge to home with PCP follow-up.  Return for new or worsening symptoms.  Montine Circle, PA-C 09/05/14 1836  Virgel Manifold, MD 09/06/14 [phone]

## 2014-09-11 ENCOUNTER — Emergency Department (HOSPITAL_COMMUNITY): Payer: Medicare Other

## 2014-09-11 ENCOUNTER — Encounter (HOSPITAL_COMMUNITY): Payer: Self-pay

## 2014-09-11 ENCOUNTER — Emergency Department (HOSPITAL_COMMUNITY)
Admission: EM | Admit: 2014-09-11 | Discharge: 2014-09-12 | Disposition: A | Discharge status: Home / Self Care | Payer: Medicare Other | Attending: Emergency Medicine | Admitting: Emergency Medicine

## 2014-09-11 DIAGNOSIS — Z8781 Personal history of (healed) traumatic fracture: Secondary | ICD-10-CM | POA: Diagnosis not present

## 2014-09-11 DIAGNOSIS — E559 Vitamin D deficiency, unspecified: Secondary | ICD-10-CM | POA: Insufficient documentation

## 2014-09-11 DIAGNOSIS — Z8739 Personal history of other diseases of the musculoskeletal system and connective tissue: Secondary | ICD-10-CM | POA: Diagnosis not present

## 2014-09-11 DIAGNOSIS — Z79899 Other long term (current) drug therapy: Secondary | ICD-10-CM | POA: Insufficient documentation

## 2014-09-11 DIAGNOSIS — I10 Essential (primary) hypertension: Secondary | ICD-10-CM | POA: Insufficient documentation

## 2014-09-11 DIAGNOSIS — R4182 Altered mental status, unspecified: Secondary | ICD-10-CM

## 2014-09-11 DIAGNOSIS — E538 Deficiency of other specified B group vitamins: Secondary | ICD-10-CM | POA: Diagnosis not present

## 2014-09-11 DIAGNOSIS — Z8719 Personal history of other diseases of the digestive system: Secondary | ICD-10-CM | POA: Diagnosis not present

## 2014-09-11 DIAGNOSIS — Z87442 Personal history of urinary calculi: Secondary | ICD-10-CM | POA: Insufficient documentation

## 2014-09-11 DIAGNOSIS — E785 Hyperlipidemia, unspecified: Secondary | ICD-10-CM | POA: Diagnosis not present

## 2014-09-11 DIAGNOSIS — F10129 Alcohol abuse with intoxication, unspecified: Secondary | ICD-10-CM | POA: Insufficient documentation

## 2014-09-11 DIAGNOSIS — I679 Cerebrovascular disease, unspecified: Secondary | ICD-10-CM | POA: Diagnosis not present

## 2014-09-11 DIAGNOSIS — R7989 Other specified abnormal findings of blood chemistry: Secondary | ICD-10-CM | POA: Diagnosis not present

## 2014-09-11 DIAGNOSIS — Z8742 Personal history of other diseases of the female genital tract: Secondary | ICD-10-CM | POA: Diagnosis not present

## 2014-09-11 DIAGNOSIS — Z7982 Long term (current) use of aspirin: Secondary | ICD-10-CM | POA: Insufficient documentation

## 2014-09-11 DIAGNOSIS — Z8673 Personal history of transient ischemic attack (TIA), and cerebral infarction without residual deficits: Secondary | ICD-10-CM | POA: Insufficient documentation

## 2014-09-11 DIAGNOSIS — F10920 Alcohol use, unspecified with intoxication, uncomplicated: Secondary | ICD-10-CM

## 2014-09-11 DIAGNOSIS — R531 Weakness: Secondary | ICD-10-CM | POA: Diagnosis not present

## 2014-09-11 DIAGNOSIS — R404 Transient alteration of awareness: Secondary | ICD-10-CM | POA: Diagnosis not present

## 2014-09-11 DIAGNOSIS — J9811 Atelectasis: Secondary | ICD-10-CM | POA: Diagnosis not present

## 2014-09-11 DIAGNOSIS — R5383 Other fatigue: Secondary | ICD-10-CM | POA: Diagnosis not present

## 2014-09-11 LAB — COMPREHENSIVE METABOLIC PANEL
ALBUMIN: 3.6 g/dL (ref 3.5–5.2)
ALT: 11 U/L (ref 0–35)
AST: 16 U/L (ref 0–37)
Alkaline Phosphatase: 73 U/L (ref 39–117)
Anion gap: 12 (ref 5–15)
BILIRUBIN TOTAL: 0.3 mg/dL (ref 0.3–1.2)
BUN: 7 mg/dL (ref 6–23)
CHLORIDE: 100 mmol/L (ref 96–112)
CO2: 24 mmol/L (ref 19–32)
Calcium: 8.4 mg/dL (ref 8.4–10.5)
Creatinine, Ser: 0.84 mg/dL (ref 0.50–1.10)
GFR calc Af Amer: 77 mL/min — ABNORMAL LOW (ref >90)
GFR calc non Af Amer: 67 mL/min — ABNORMAL LOW (ref >90)
Glucose, Bld: 132 mg/dL — ABNORMAL HIGH (ref 70–99)
POTASSIUM: 3.7 mmol/L (ref 3.5–5.1)
Sodium: 136 mmol/L (ref 135–145)
Total Protein: 6.6 g/dL (ref 6.0–8.3)

## 2014-09-11 LAB — CBC WITH DIFFERENTIAL/PLATELET
Basophils Absolute: 0 10*3/uL (ref 0.0–0.1)
Basophils Relative: 1 % (ref 0–1)
EOS ABS: 0.2 10*3/uL (ref 0.0–0.7)
Eosinophils Relative: 2 % (ref 0–5)
HEMATOCRIT: 44 % (ref 36.0–46.0)
Hemoglobin: 14.6 g/dL (ref 12.0–15.0)
Lymphocytes Relative: 23 % (ref 12–46)
Lymphs Abs: 2 10*3/uL (ref 0.7–4.0)
MCH: 31.5 pg (ref 26.0–34.0)
MCHC: 33.2 g/dL (ref 30.0–36.0)
MCV: 95 fL (ref 78.0–100.0)
MONO ABS: 0.2 10*3/uL (ref 0.1–1.0)
Monocytes Relative: 3 % (ref 3–12)
NEUTROS ABS: 6.2 10*3/uL (ref 1.7–7.7)
Neutrophils Relative %: 71 % (ref 43–77)
PLATELETS: 345 10*3/uL (ref 150–400)
RBC: 4.63 MIL/uL (ref 3.87–5.11)
RDW: 13.1 % (ref 11.5–15.5)
WBC: 8.6 10*3/uL (ref 4.0–10.5)

## 2014-09-11 LAB — I-STAT VENOUS BLOOD GAS, ED
ACID-BASE DEFICIT: 2 mmol/L (ref 0.0–2.0)
BICARBONATE: 23.8 meq/L (ref 20.0–24.0)
O2 SAT: 46 %
PO2 VEN: 27 mmHg — AB (ref 30.0–45.0)
TCO2: 25 mmol/L (ref 0–100)
pCO2, Ven: 44.6 mmHg — ABNORMAL LOW (ref 45.0–50.0)
pH, Ven: 7.335 — ABNORMAL HIGH (ref 7.250–7.300)

## 2014-09-11 LAB — LACTIC ACID, PLASMA: Lactic Acid, Venous: 4.1 mmol/L (ref 0.5–2.0)

## 2014-09-11 LAB — URINALYSIS, ROUTINE W REFLEX MICROSCOPIC
Bilirubin Urine: NEGATIVE
Glucose, UA: NEGATIVE mg/dL
Hgb urine dipstick: NEGATIVE
KETONES UR: 15 mg/dL — AB
Leukocytes, UA: NEGATIVE
Nitrite: NEGATIVE
PH: 5 (ref 5.0–8.0)
Protein, ur: NEGATIVE mg/dL
Specific Gravity, Urine: 1.007 (ref 1.005–1.030)
UROBILINOGEN UA: 0.2 mg/dL (ref 0.0–1.0)

## 2014-09-11 LAB — CBG MONITORING, ED: GLUCOSE-CAPILLARY: 131 mg/dL — AB (ref 70–99)

## 2014-09-11 LAB — ETHANOL: Alcohol, Ethyl (B): 268 mg/dL — ABNORMAL HIGH (ref 0–9)

## 2014-09-11 LAB — TSH: TSH: 0.955 u[IU]/mL (ref 0.350–4.500)

## 2014-09-11 LAB — LIPASE, BLOOD: Lipase: 34 U/L (ref 11–59)

## 2014-09-11 MED ORDER — SODIUM CHLORIDE 0.9 % IV BOLUS (SEPSIS)
1000.0000 mL | Freq: Once | INTRAVENOUS | Status: AC
  Administered 2014-09-11: 1000 mL via INTRAVENOUS

## 2014-09-11 NOTE — ED Provider Notes (Signed)
CSN: [id number]     Arrival date & time 09/11/14  2111 History   First MD Initiated Contact with Patient 09/11/14 2117     Chief Complaint  Patient presents with  . Altered Mental Status     (Consider location/radiation/quality/duration/timing/severity/associated sxs/prior Treatment) Patient is a 74 y.o. female presenting with altered mental status. The history is provided by the EMS personnel. The history is limited by the condition of the patient. No language interpreter was used.  Altered Mental Status Presenting symptoms: confusion and disorientation   Severity:  Moderate Most recent episode:  More than 2 days ago Episode history:  Single Timing:  Constant Progression:  Unchanged Chronicity:  New   Past Medical History  Diagnosis Date  . Hypertension   . Gout   . Closed left hip fracture 5/14  . Alcohol abuse   . GERD (gastroesophageal reflux disease)   . Vitamin D deficiency   . Vitamin B12 deficiency   . Depression   . Cerebrovascular accident (stroke) 2/07    left frontal lobe  . Dupuytren's contracture   . Bacterial vaginosis   . Hyperlipidemia LDL goal <70   . Fibrocystic breast disease   . Paroxysmal a-fib   . Renal calculus   . Fracture of metatarsal bone of right foot     5th metatarsal   Past Surgical History  Procedure Laterality Date  . Leg surgery    . Wrist surgery      left wrist  . Hip arthroplasty Left 12/17/2012    Procedure: LEFT HIP HEMI ARTHROPLASTY ;  Surgeon: Rozanna Box, MD;  Location: [facility];  Service: Orthopedics;  Laterality: Left;   Family History  Problem Relation Age of Onset  . Stroke Mother    History  Substance Use Topics  . Smoking status: Never Smoker   . Smokeless tobacco: Not on file  . Alcohol Use: 8.4 oz/week    14 Cans of beer per week     Comment: daily   OB History    No data available     Review of Systems  Unable to perform ROS: Mental status change  Psychiatric/Behavioral: Positive for confusion.       Allergies  Ambien  Home Medications   Prior to Admission medications   Medication Sig Start Date End Date Taking? Authorizing Provider  amLODipine (NORVASC) 5 MG tablet Take 5 mg by mouth daily.    Historical Provider, MD  aspirin EC 325 MG tablet Take 1 tablet (325 mg total) by mouth daily. 06/06/14   Modena Jansky, MD  atenolol (TENORMIN) 25 MG tablet Take 1 tablet (25 mg total) by mouth daily. 06/06/14   Modena Jansky, MD  cholecalciferol (VITAMIN D) 1000 UNITS tablet Take 1,000 Units by mouth daily.    Historical Provider, MD  folic acid (FOLVITE) 1 MG tablet Take 1 tablet (1 mg total) by mouth daily. 06/06/14   Modena Jansky, MD  ibuprofen (ADVIL) 200 MG tablet Take 400 mg by mouth every 6 (six) hours as needed for fever, headache or mild pain.    Historical Provider, MD  lactose free nutrition (BOOST PLUS) LIQD Take 237 mLs by mouth 2 (two) times daily between meals. Patient not taking: Reported on 09/05/2014 06/06/14   Modena Jansky, MD  Multiple Vitamin (MULTIVITAMIN WITH MINERALS) TABS tablet Take 1 tablet by mouth daily. 06/06/14   Modena Jansky, MD  simvastatin (ZOCOR) 20 MG tablet Take 1 tablet (20 mg total) by mouth  daily at 6 PM. 06/06/14   Modena Jansky, MD  thiamine 100 MG tablet Take 1 tablet (100 mg total) by mouth daily. 06/06/14   Modena Jansky, MD   BP 118/62 mmHg  Pulse 71  Temp(Src) 98.4 F (36.9 C) (Oral)  Resp 18  SpO2 96% Physical Exam  Constitutional: She is oriented to person, place, and time. She appears well-developed and well-nourished. She appears lethargic. She is easily aroused. No distress.  HENT:  Head: Normocephalic and atraumatic.  Eyes: Pupils are equal, round, and reactive to light.  Neck: Normal range of motion.  Cardiovascular: Normal rate, regular rhythm, normal heart sounds and intact distal pulses.   Pulmonary/Chest: Effort normal. No respiratory distress. She has no wheezes. She exhibits no tenderness.   Abdominal: Soft. Bowel sounds are normal. She exhibits no distension. There is no tenderness. There is no rebound and no guarding.  Neurological: She is oriented to person, place, and time and easily aroused. She has normal strength. She appears lethargic. No cranial nerve deficit or sensory deficit. She exhibits normal muscle tone. Coordination and gait normal. GCS eye subscore is 3. GCS verbal subscore is 4. GCS motor subscore is 4.  Patient moves all extremities symmetrically.  Follows commands poorly with poor cooperation.    Skin: Skin is warm and dry.  Nursing note and vitals reviewed.   ED Course  Procedures (including critical care time) Labs Review Labs Reviewed  COMPREHENSIVE METABOLIC PANEL - Abnormal; Notable for the following:    Glucose, Bld 132 (*)    GFR calc non Af Amer 67 (*)    GFR calc Af Amer 77 (*)    All other components within normal limits  LACTIC ACID, PLASMA - Abnormal; Notable for the following:    Lactic Acid, Venous 4.1 (*)    All other components within normal limits  ETHANOL - Abnormal; Notable for the following:    Alcohol, Ethyl (B) 268 (*)    All other components within normal limits  URINALYSIS, ROUTINE W REFLEX MICROSCOPIC - Abnormal; Notable for the following:    Ketones, ur 15 (*)    All other components within normal limits  CBG MONITORING, ED - Abnormal; Notable for the following:    Glucose-Capillary 131 (*)    All other components within normal limits  I-STAT VENOUS BLOOD GAS, ED - Abnormal; Notable for the following:    pH, Ven 7.335 (*)    pCO2, Ven 44.6 (*)    pO2, Ven 27.0 (*)    All other components within normal limits  CBC WITH DIFFERENTIAL/PLATELET  LIPASE, BLOOD  TSH  URINE RAPID DRUG SCREEN (HOSP PERFORMED)    Imaging Review Ct Head Wo Contrast  09/11/2014   CLINICAL DATA:  Altered and lethargic for 5 days. Altered mental status.  Past Medial History: Prior CVA noted.  Vitamin B12 deficiency  EXAM: CT HEAD WITHOUT  CONTRAST  TECHNIQUE: Contiguous axial images were obtained from the base of the skull through the vertex without intravenous contrast.  COMPARISON:  09/05/2014, 06/04/2014  FINDINGS: There is no evidence of mass effect, midline shift, or extra-axial fluid collections. There is no evidence of a space-occupying lesion or intracranial hemorrhage. There is no evidence of a cortical-based area of acute infarction. There is an old left basal ganglia lacunar infarct. There is generalized cerebral atrophy. There is periventricular white matter low attenuation likely secondary to microangiopathy.  The ventricles and sulci are appropriate for the patient's age. The basal cisterns are patent.  Visualized portions of the orbits are unremarkable. The visualized portions of the paranasal sinuses and mastoid air cells are unremarkable. Cerebrovascular atherosclerotic calcifications are noted.  The osseous structures are unremarkable.  IMPRESSION: 1. No acute intracranial pathology. 2. Chronic microvascular disease and cerebral atrophy.   Electronically Signed   By: Kathreen Devoid   On: 09/11/2014 22:21   Dg Chest Portable 1 View  09/11/2014   CLINICAL DATA:  Altered mental status. History of hypertension and stroke.  EXAM: PORTABLE CHEST - 1 VIEW  COMPARISON:  09/05/2014  FINDINGS: There is mild airspace in the left base, probably atelectatic. Mild linear atelectasis is suggested in the right base. Pulmonary vasculature is normal. No large effusions are evident.  IMPRESSION: Bibasilar atelectatic changes.   Electronically Signed   By: Andreas Newport M.D.   On: 09/11/2014 21:48     EKG Interpretation   Date/Time:  Friday September 11 2014 21:35:30 EST Ventricular Rate:  63 PR Interval:  217 QRS Duration: 145 QT Interval:  465 QTC Calculation: 476 R Axis:   89 Text Interpretation:  Sinus rhythm Borderline prolonged PR interval Right  bundle branch block No significant change since last tracing Confirmed by  Glynn Octave [phone]) on 09/12/2014 12:38:03 AM      MDM   Final diagnoses:  Altered mental status  Alcohol intoxication, uncomplicated    Patient is a 74 year old Caucasian female with pertinent past medical history of CVAs and alcohol abuse who comes to the emergency department today with altered mental status for the past 5 days. Patient was evaluated at [facility] approximately 5 days ago for a similar complaint and was discharged home. Upon arrival patient is significantly altered. She is able to move all extremities equally as a result I feel that CVA is unlikely. Patient does arouse to verbal stimulus and pain and is able to answer some questions.  As result I do not feel that she requires intubation at this time. Initial differential is broad including metabolic and infectious causes of altered mental status and ingestions.  Initial workup included a VBG, CBC, UA, UDS, lipase, lactic acid, alcohol, TSH, CBG, CT of the head, EKG, and a chest x-ray. EKG is detailed above. Chest x-ray was unremarkable. CT the head demonstrated microvascular disease and cerebral atrophy.  There was no evidence of hemorrhage. His result I doubt a CVA.  CMP was unremarkable. CBC was unremarkable. Lipase 34. Lactic acid is elevated at 4.1. Patient was treated with 2 L of normal saline. Alcohol level was elevated at 268. Lactic acid is likely elevated secondary to her intoxication.  TSH was 0.955. Patient's symptoms are likely secondary to alcohol intoxication. However we do not have any family to provide further history.  I feel the patient will need to be reevaluated once she is sober. If the patient is back to her baseline mental status at that time she can be discharged safely to her family's care. Patient was transferred in good condition to Dr. One at 19 PM. Please see his note for continued details of care. Labs and imaging reviewed by myself and considered in medical decision making. Imaging was interpreted by  Radiology. Care was discussed with my attending Dr. Audie Pinto.     Katheren Shams, MD 09/12/14 1316  Dot Lanes, MD 09/13/14 [phone]

## 2014-09-11 NOTE — ED Notes (Signed)
Per EMS, patient has been altered and lethargic x5 days. Able to answer questions and follow commands occasionally but is not consistent. Poor history from family.

## 2014-09-12 LAB — RAPID URINE DRUG SCREEN, HOSP PERFORMED
AMPHETAMINES: NOT DETECTED
Barbiturates: NOT DETECTED
Benzodiazepines: NOT DETECTED
Cocaine: NOT DETECTED
Opiates: NOT DETECTED
Tetrahydrocannabinol: NOT DETECTED

## 2014-09-12 NOTE — Discharge Instructions (Signed)
Alcohol Intoxication Ms. Dyk,  Please refrain from heavy drinking in the future and follow-up with your  primary care physician regarding quitting. If symptoms worsen come back to emergency department immediately. Thank you. Alcohol intoxication occurs when you drink enough alcohol that it affects your ability to function. It can be mild or very severe. Drinking a lot of alcohol in a short time is called binge drinking. This can be very harmful. Drinking alcohol can also be more dangerous if you are taking medicines or other drugs. Some of the effects caused by alcohol may include:  Loss of coordination.  Changes in mood and behavior.  Unclear thinking.  Trouble talking (slurred speech).  Throwing up (vomiting).  Confusion.  Slowed breathing.  Twitching and shaking (seizures).  Loss of consciousness. HOME CARE  Do not drive after drinking alcohol.  Drink enough water and fluids to keep your pee (urine) clear or pale yellow. Avoid caffeine.  Only take medicine as told by your doctor. GET HELP IF:  You throw up (vomit) many times.  You do not feel better after a few days.  You frequently have alcohol intoxication. Your doctor can help decide if you should see a substance use treatment counselor. GET HELP RIGHT AWAY IF:  You become shaky when you stop drinking.  You have twitching and shaking.  You throw up blood. It may look bright red or like coffee grounds.  You notice blood in your poop (bowel movements).  You become lightheaded or pass out (faint). MAKE SURE YOU:   Understand these instructions.  Will watch your condition.  Will get help right away if you are not doing well or get worse. Document Released: 01/17/2008 Document Revised: 04/02/2013 Document Reviewed: 01/03/2013 [facility] Patient Information 2015 [place], [place]. This information is not intended to replace advice given to you by your health care provider. Make sure you discuss any questions you  have with your health care provider.

## 2014-09-12 NOTE — ED Notes (Signed)
Pt continues to rest in bed, will arouse when stimulated then goes back to sleep, no distress noted

## 2014-10-14 ENCOUNTER — Other Ambulatory Visit: Payer: Self-pay | Admitting: Internal Medicine

## 2014-10-14 DIAGNOSIS — R51 Headache: Principal | ICD-10-CM

## 2014-10-14 DIAGNOSIS — R519 Headache, unspecified: Secondary | ICD-10-CM

## 2014-10-15 DIAGNOSIS — D81818 Other biotin-dependent carboxylase deficiency: Secondary | ICD-10-CM | POA: Diagnosis not present

## 2014-10-27 ENCOUNTER — Ambulatory Visit
Admission: RE | Admit: 2014-10-27 | Discharge: 2014-10-27 | Disposition: A | Discharge status: Home / Self Care | Payer: Medicare Other | Source: Ambulatory Visit | Attending: Internal Medicine | Admitting: Internal Medicine

## 2014-10-27 DIAGNOSIS — I671 Cerebral aneurysm, nonruptured: Secondary | ICD-10-CM | POA: Diagnosis not present

## 2014-10-27 DIAGNOSIS — R51 Headache: Principal | ICD-10-CM

## 2014-10-27 DIAGNOSIS — R519 Headache, unspecified: Secondary | ICD-10-CM

## 2014-10-27 DIAGNOSIS — I672 Cerebral atherosclerosis: Secondary | ICD-10-CM | POA: Diagnosis not present

## 2014-10-27 DIAGNOSIS — E538 Deficiency of other specified B group vitamins: Secondary | ICD-10-CM | POA: Diagnosis not present

## 2014-10-27 MED ORDER — GADOBENATE DIMEGLUMINE 529 MG/ML IV SOLN
12.0000 mL | Freq: Once | INTRAVENOUS | Status: AC | PRN
  Administered 2014-10-27: 12 mL via INTRAVENOUS

## 2014-10-30 DIAGNOSIS — E538 Deficiency of other specified B group vitamins: Secondary | ICD-10-CM | POA: Diagnosis not present

## 2015-01-13 DIAGNOSIS — C801 Malignant (primary) neoplasm, unspecified: Secondary | ICD-10-CM

## 2015-01-13 HISTORY — DX: Malignant (primary) neoplasm, unspecified: C80.1

## 2015-09-14 DIAGNOSIS — J3489 Other specified disorders of nose and nasal sinuses: Secondary | ICD-10-CM | POA: Diagnosis not present

## 2015-09-14 DIAGNOSIS — R05 Cough: Secondary | ICD-10-CM | POA: Diagnosis not present

## 2015-09-14 DIAGNOSIS — J029 Acute pharyngitis, unspecified: Secondary | ICD-10-CM | POA: Diagnosis not present

## 2015-12-28 ENCOUNTER — Other Ambulatory Visit: Payer: Self-pay | Admitting: Internal Medicine

## 2015-12-28 DIAGNOSIS — N63 Unspecified lump in unspecified breast: Secondary | ICD-10-CM

## 2015-12-31 ENCOUNTER — Other Ambulatory Visit: Payer: Self-pay | Admitting: Internal Medicine

## 2015-12-31 ENCOUNTER — Ambulatory Visit
Admission: RE | Admit: 2015-12-31 | Discharge: 2015-12-31 | Disposition: A | Discharge status: Home / Self Care | Payer: Medicare Other | Source: Ambulatory Visit | Attending: Internal Medicine | Admitting: Internal Medicine

## 2015-12-31 DIAGNOSIS — N6002 Solitary cyst of left breast: Secondary | ICD-10-CM | POA: Diagnosis not present

## 2015-12-31 DIAGNOSIS — R921 Mammographic calcification found on diagnostic imaging of breast: Secondary | ICD-10-CM | POA: Diagnosis not present

## 2015-12-31 DIAGNOSIS — N63 Unspecified lump in unspecified breast: Secondary | ICD-10-CM

## 2016-01-03 DIAGNOSIS — E538 Deficiency of other specified B group vitamins: Secondary | ICD-10-CM | POA: Diagnosis not present

## 2016-01-05 ENCOUNTER — Other Ambulatory Visit: Payer: Self-pay | Admitting: Internal Medicine

## 2016-01-05 ENCOUNTER — Ambulatory Visit
Admission: RE | Admit: 2016-01-05 | Discharge: 2016-01-05 | Disposition: A | Discharge status: Home / Self Care | Payer: Medicare Other | Source: Ambulatory Visit | Attending: Internal Medicine | Admitting: Internal Medicine

## 2016-01-05 DIAGNOSIS — N6489 Other specified disorders of breast: Secondary | ICD-10-CM | POA: Diagnosis not present

## 2016-01-05 DIAGNOSIS — N63 Unspecified lump in unspecified breast: Secondary | ICD-10-CM

## 2016-01-05 DIAGNOSIS — R921 Mammographic calcification found on diagnostic imaging of breast: Secondary | ICD-10-CM | POA: Diagnosis not present

## 2016-01-05 DIAGNOSIS — C50411 Malignant neoplasm of upper-outer quadrant of right female breast: Secondary | ICD-10-CM | POA: Diagnosis not present

## 2016-01-07 ENCOUNTER — Telehealth: Payer: Self-pay | Admitting: *Deleted

## 2016-01-07 DIAGNOSIS — C50411 Malignant neoplasm of upper-outer quadrant of right female breast: Secondary | ICD-10-CM | POA: Insufficient documentation

## 2016-01-07 DIAGNOSIS — Z170 Estrogen receptor positive status [ER+]: Secondary | ICD-10-CM

## 2016-01-07 NOTE — Telephone Encounter (Signed)
Left message for a return phone call to schedule for [facility] 01/12/16.

## 2016-01-07 NOTE — Telephone Encounter (Signed)
Confirmed [facility] for 01/12/16 at 1215pm. .  Instructions and contact information given.

## 2016-01-12 ENCOUNTER — Other Ambulatory Visit: Payer: Self-pay

## 2016-01-12 ENCOUNTER — Ambulatory Visit (HOSPITAL_BASED_OUTPATIENT_CLINIC_OR_DEPARTMENT_OTHER): Payer: Medicare Other | Admitting: Oncology

## 2016-01-12 ENCOUNTER — Encounter: Payer: Self-pay | Admitting: General Practice

## 2016-01-12 ENCOUNTER — Other Ambulatory Visit (HOSPITAL_BASED_OUTPATIENT_CLINIC_OR_DEPARTMENT_OTHER): Payer: Medicare Other

## 2016-01-12 ENCOUNTER — Ambulatory Visit: Payer: Medicare Other | Attending: General Surgery | Admitting: Physical Therapy

## 2016-01-12 ENCOUNTER — Encounter: Payer: Self-pay | Admitting: Oncology

## 2016-01-12 ENCOUNTER — Encounter: Payer: Self-pay | Admitting: Physical Therapy

## 2016-01-12 ENCOUNTER — Ambulatory Visit
Admission: RE | Admit: 2016-01-12 | Discharge: 2016-01-12 | Disposition: A | Discharge status: Home / Self Care | Payer: Medicare Other | Source: Ambulatory Visit | Attending: Radiation Oncology | Admitting: Radiation Oncology

## 2016-01-12 ENCOUNTER — Emergency Department (HOSPITAL_COMMUNITY)
Admission: EM | Admit: 2016-01-12 | Discharge: 2016-01-12 | Disposition: A | Discharge status: Home / Self Care | Payer: Medicare Other | Attending: Emergency Medicine | Admitting: Emergency Medicine

## 2016-01-12 ENCOUNTER — Encounter (HOSPITAL_COMMUNITY): Payer: Self-pay

## 2016-01-12 VITALS — BP 145/70 | HR 67 | Temp 98.7°F | Resp 18 | Ht 70.0 in | Wt 130.4 lb

## 2016-01-12 DIAGNOSIS — Z7982 Long term (current) use of aspirin: Secondary | ICD-10-CM | POA: Diagnosis not present

## 2016-01-12 DIAGNOSIS — C50411 Malignant neoplasm of upper-outer quadrant of right female breast: Secondary | ICD-10-CM

## 2016-01-12 DIAGNOSIS — F329 Major depressive disorder, single episode, unspecified: Secondary | ICD-10-CM | POA: Insufficient documentation

## 2016-01-12 DIAGNOSIS — Z8673 Personal history of transient ischemic attack (TIA), and cerebral infarction without residual deficits: Secondary | ICD-10-CM | POA: Insufficient documentation

## 2016-01-12 DIAGNOSIS — R0989 Other specified symptoms and signs involving the circulatory and respiratory systems: Secondary | ICD-10-CM

## 2016-01-12 DIAGNOSIS — Z96642 Presence of left artificial hip joint: Secondary | ICD-10-CM | POA: Diagnosis not present

## 2016-01-12 DIAGNOSIS — Z79899 Other long term (current) drug therapy: Secondary | ICD-10-CM | POA: Diagnosis not present

## 2016-01-12 DIAGNOSIS — R293 Abnormal posture: Secondary | ICD-10-CM | POA: Diagnosis not present

## 2016-01-12 DIAGNOSIS — Y999 Unspecified external cause status: Secondary | ICD-10-CM | POA: Diagnosis not present

## 2016-01-12 DIAGNOSIS — T189XXA Foreign body of alimentary tract, part unspecified, initial encounter: Secondary | ICD-10-CM | POA: Diagnosis not present

## 2016-01-12 DIAGNOSIS — E785 Hyperlipidemia, unspecified: Secondary | ICD-10-CM | POA: Insufficient documentation

## 2016-01-12 DIAGNOSIS — I10 Essential (primary) hypertension: Secondary | ICD-10-CM | POA: Diagnosis not present

## 2016-01-12 DIAGNOSIS — X58XXXA Exposure to other specified factors, initial encounter: Secondary | ICD-10-CM | POA: Insufficient documentation

## 2016-01-12 DIAGNOSIS — N6489 Other specified disorders of breast: Secondary | ICD-10-CM | POA: Diagnosis not present

## 2016-01-12 DIAGNOSIS — Y939 Activity, unspecified: Secondary | ICD-10-CM | POA: Diagnosis not present

## 2016-01-12 DIAGNOSIS — Y929 Unspecified place or not applicable: Secondary | ICD-10-CM | POA: Diagnosis not present

## 2016-01-12 LAB — CBC WITH DIFFERENTIAL/PLATELET
BASO%: 0.8 % (ref 0.0–2.0)
Basophils Absolute: 0.1 10*3/uL (ref 0.0–0.1)
EOS%: 3.2 % (ref 0.0–7.0)
Eosinophils Absolute: 0.3 10*3/uL (ref 0.0–0.5)
HCT: 51 % — ABNORMAL HIGH (ref 34.8–46.6)
HEMOGLOBIN: 16.8 g/dL — AB (ref 11.6–15.9)
LYMPH%: 14.1 % (ref 14.0–49.7)
MCH: 31.9 pg (ref 25.1–34.0)
MCHC: 32.9 g/dL (ref 31.5–36.0)
MCV: 97.2 fL (ref 79.5–101.0)
MONO#: 0.4 10*3/uL (ref 0.1–0.9)
MONO%: 4.1 % (ref 0.0–14.0)
NEUT%: 77.8 % — AB (ref 38.4–76.8)
NEUTROS ABS: 8.3 10*3/uL — AB (ref 1.5–6.5)
Platelets: 285 10*3/uL (ref 145–400)
RBC: 5.25 10*6/uL (ref 3.70–5.45)
RDW: 13.2 % (ref 11.2–14.5)
WBC: 10.7 10*3/uL — ABNORMAL HIGH (ref 3.9–10.3)
lymph#: 1.5 10*3/uL (ref 0.9–3.3)

## 2016-01-12 LAB — COMPREHENSIVE METABOLIC PANEL
ALBUMIN: 3.6 g/dL (ref 3.5–5.0)
ALK PHOS: 108 U/L (ref 40–150)
ALT: 12 U/L (ref 0–55)
AST: 19 U/L (ref 5–34)
Anion Gap: 10 mEq/L (ref 3–11)
BILIRUBIN TOTAL: 0.81 mg/dL (ref 0.20–1.20)
BUN: 7.4 mg/dL (ref 7.0–26.0)
CO2: 27 mEq/L (ref 22–29)
CREATININE: 0.9 mg/dL (ref 0.6–1.1)
Calcium: 9.4 mg/dL (ref 8.4–10.4)
Chloride: 100 mEq/L (ref 98–109)
EGFR: 67 mL/min/{1.73_m2} — ABNORMAL LOW (ref >90)
GLUCOSE: 137 mg/dL (ref 70–140)
Potassium: 4.4 mEq/L (ref 3.5–5.1)
SODIUM: 138 meq/L (ref 136–145)
TOTAL PROTEIN: 7.4 g/dL (ref 6.4–8.3)

## 2016-01-12 MED ORDER — GI COCKTAIL ~~LOC~~
30.0000 mL | Freq: Once | ORAL | Status: AC
  Administered 2016-01-12: 30 mL via ORAL
  Filled 2016-01-12: qty 30

## 2016-01-12 NOTE — Therapy (Signed)
[place], [place], [address] Phone: [phone]   Fax:  [phone]  Physical Therapy Evaluation  Patient Details  Name: [name] MRN: [id number] Date of Birth: 1941/05/29 Referring Provider: Dr. Rolm Bookbinder  Encounter Date: 01/12/2016      PT End of Session - 01/12/16 1610    Visit Number 1   Number of Visits 1   PT Start Time 7867   PT Stop Time 1515   PT Time Calculation (min) 28 min   Activity Tolerance Patient tolerated treatment well   Behavior During Therapy [facility] for tasks assessed/performed      Past Medical History  Diagnosis Date  . Hypertension   . Gout   . Closed left hip fracture ([place]) 5/14  . Alcohol abuse   . GERD (gastroesophageal reflux disease)   . Vitamin D deficiency   . Vitamin B12 deficiency   . Depression   . Cerebrovascular accident (stroke) ([facility]) 2/07    left frontal lobe  . Dupuytren's contracture   . Bacterial vaginosis   . Hyperlipidemia LDL goal <70   . Fibrocystic breast disease   . Paroxysmal a-fib ([place])   . Renal calculus   . Fracture of metatarsal bone of right foot     5th metatarsal    Past Surgical History  Procedure Laterality Date  . Leg surgery    . Wrist surgery      left wrist  . Hip arthroplasty Left 12/17/2012    Procedure: LEFT HIP HEMI ARTHROPLASTY ;  Surgeon: Rozanna Box, MD;  Location: [facility];  Service: Orthopedics;  Laterality: Left;    There were no vitals filed for this visit.       Subjective Assessment - 01/12/16 1611    Subjective Patient reports she is here today to be seen by her medical team for her newly diagnosed left breast cancer.   Pertinent History Patient was diagnosed 12/31/15 with right grade 2 invasive ductal carcinoma breast cancer.  There are 2 masses with the largest measuring 4.9 cm in the upper outer quadrant. It is ER/PR positive, HER2 negative with a Ki67 of 20%. Her multidisciplinary medical  team met prior to her assessment to determine a recommended treatment plan.   Patient Stated Goals Reduce lymphedema risk and learn post op shoulder ROM HEP            [facility] PT Assessment - 01/12/16 0001    Assessment   Medical Diagnosis Right breast cancer   Referring Provider Dr. Rolm Bookbinder   Onset Date/Surgical Date 12/31/15   Hand Dominance Right   Prior Therapy none   Precautions   Precautions Other (comment)   Precaution Comments Active breast cancer   Restrictions   Weight Bearing Restrictions No   Balance Screen   Has the patient fallen in the past 6 months No   Has the patient had a decrease in activity level because of a fear of falling?  No   Is the patient reluctant to leave their home because of a fear of falling?  No   Home Environment   Living Environment Private residence   Living Arrangements Children  Lives with adult son but he is moving to [place] tomorrow   Available Help at [facility]  Her neighbor is here with her today   Prior Function   Level of Independence Independent   Vocation Retired   Leisure She does not exercise   Cognition  Overall Cognitive Status Within Functional Limits for tasks assessed   Posture/Postural Control   Posture/Postural Control Postural limitations   Postural Limitations Rounded Shoulders;Forward head   ROM / Strength   AROM / PROM / Strength AROM;Strength   AROM   Overall AROM Comments Left wrist fracture in 2011 resulted in fingers 2-5 being drawn into flexion; right 5th fingers also has flexion contracture   AROM Assessment Site Shoulder;Cervical   Right/Left Shoulder Right;Left   Right Shoulder Extension 50 Degrees   Right Shoulder Flexion 165 Degrees   Right Shoulder ABduction 170 Degrees   Right Shoulder Internal Rotation 69 Degrees   Right Shoulder External Rotation 71 Degrees   Left Shoulder Extension 58 Degrees   Left Shoulder Flexion 160 Degrees   Left Shoulder ABduction 169 Degrees    Left Shoulder Internal Rotation 69 Degrees   Left Shoulder External Rotation 81 Degrees   Cervical Flexion WNL   Cervical Extension WNL   Cervical - Right Side Bend WNL   Cervical - Left Side Bend WNL   Cervical - Right Rotation WNL   Cervical - Left Rotation WNL   Strength   Overall Strength Within functional limits for tasks performed           LYMPHEDEMA/ONCOLOGY QUESTIONNAIRE - 01/12/16 1608    Type   Cancer Type Right breast cancer   Lymphedema Assessments   Lymphedema Assessments Upper extremities   Right Upper Extremity Lymphedema   10 cm Proximal to Olecranon Process 21 cm   Olecranon Process 21.8 cm   10 cm Proximal to Ulnar Styloid Process 18.4 cm   Just Proximal to Ulnar Styloid Process 15 cm   Across Hand at [employer] 18.9 cm   At [place] of 2nd Digit 5.9 cm   Left Upper Extremity Lymphedema   10 cm Proximal to Olecranon Process 21.1 cm   Olecranon Process 22.9 cm   10 cm Proximal to Ulnar Styloid Process 17.3 cm   Just Proximal to Ulnar Styloid Process 15.5 cm   Across Hand at [employer] 17.6 cm   At [place] of 2nd Digit 5.9 cm      Patient was instructed today in a home exercise program today for post op shoulder range of motion. These included active assist shoulder flexion in sitting, scapular retraction, wall walking with shoulder abduction, and hands behind head external rotation.  She was encouraged to do these twice a day, holding 3 seconds and repeating 5 times when permitted by her physician.         PT Education - 01/12/16 1610    Education provided Yes   Education Details Lymphedema risk reduction and post op shoulder ROM HEP   Person(s) Educated Patient   Methods Explanation;Demonstration;Handout   Comprehension Returned demonstration;Verbalized understanding              Breast Clinic Goals - 01/12/16 1621    Patient will be able to verbalize understanding of pertinent lymphedema risk reduction practices relevant to her  diagnosis specifically related to skin care.   Time 1   Period Days   Status Achieved   Patient will be able to return demonstrate and/or verbalize understanding of the post-op home exercise program related to regaining shoulder range of motion.   Time 1   Period Days   Status Achieved   Patient will be able to verbalize understanding of the importance of attending the postoperative After Breast Cancer Class for further lymphedema risk reduction education and therapeutic  exercise.   Time 1   Period Days   Status Achieved              Plan - 01/12/16 1611    Clinical Impression Statement Patient was diagnosed 12/31/15 with right grade 2 invasive ductal carcinoma breast cancer.  There are 2 masses with the largest measuring 4.9 cm in the upper outer quadrant. It is ER/PR positive, HER2 negative with a Ki67 of 20%. Her multidisciplinary medical team met prior to her assessment to determine a recommended treatment plan.  She is planning to have a bilateral mastectomy with a right sentinel node biopsy followed by Oncotype testing.  She will benefit from post op PT to regain shoulder ROM and reduce lymphedema risk.  Due to lack of comorbidities, her evaluation is of low complexity.   Rehab Potential Excellent   Clinical Impairments Affecting Rehab Potential none   PT Frequency One time visit   PT Treatment/Interventions Therapeutic exercise;Patient/family education   PT Next Visit Plan Will f/u after surgery   PT Home Exercise Plan Post op shoulder ROM HEP   Consulted and Agree with Plan of Care Patient      Patient will benefit from skilled therapeutic intervention in order to improve the following deficits and impairments:  Decreased strength, Decreased knowledge of precautions, Pain, Impaired UE functional use, Decreased range of motion  Visit Diagnosis: Abnormal posture - Plan: PT plan of care cert/re-cert  Carcinoma of upper-outer quadrant of right female breast ([place]) - Plan: PT  plan of care cert/re-cert      G-Codes - 32/20/25 1622    Functional Assessment Tool Used Clinical judgement   Functional Limitation Other PT primary   Other PT Primary Current Status (K2706) At least 1 percent but less than 20 percent impaired, limited or restricted   Other PT Primary Goal Status (C3762) At least 1 percent but less than 20 percent impaired, limited or restricted   Other PT Primary Discharge Status (G3151) At least 1 percent but less than 20 percent impaired, limited or restricted     Patient will follow up at outpatient cancer rehab if needed following surgery.  If the patient requires physical therapy at that time, a specific plan will be dictated and sent to the referring physician for approval. The patient was educated today on appropriate basic range of motion exercises to begin post operatively and the importance of attending the After Breast Cancer class following surgery.  Patient was educated today on lymphedema risk reduction practices as it pertains to recommendations that will benefit the patient immediately following surgery.  She verbalized good understanding.  No additional physical therapy is indicated at this time.     Problem List Patient Active Problem List   Diagnosis Date Noted  . Breast cancer of upper-outer quadrant of right female breast ([place]) 01/07/2016  . Stroke ([place]) 06/05/2014  . Alcohol dependence ([place]) 06/05/2014  . Dyslipidemia 06/05/2014  . Syncope and collapse 06/04/2014  . Postoperative anemia due to acute blood loss 12/19/2012  . Osteoporosis 12/19/2012  . Osteoporosis with fracture 12/19/2012  . Hip fracture, left ([place]) 12/16/2012  . Gout 12/16/2012  . fracture of fifth right metatarsal bone 12/16/2012  . Alcohol abuse 12/16/2012  . GERD (gastroesophageal reflux disease) 12/16/2012  . Atrial fibrillation ([place]) 12/16/2012  . B12 deficiency 12/16/2012  . Vitamin D deficiency 12/16/2012  . Esophageal stricture 12/16/2012  . Other  and unspecified hyperlipidemia 12/16/2012  . Reactive depression (situational) 12/16/2012  . Insomnia 12/16/2012  .  H/O: CVA (cerebrovascular accident) 12/16/2012  . Dupuytren's contracture of both hands 12/16/2012  . Fibrocystic breast disease 12/16/2012   Annia Friendly, PT 01/12/2016 4:25 PM  [place], [place], [address] Phone: [phone]   Fax:  [phone]  Name: [name] MRN: [id number] Date of Birth: 09/12/1940

## 2016-01-12 NOTE — ED Notes (Signed)
Pt states that she was eating a hamburger at 10 am and it was dry and it feels like its stuck in the back of her throat, she's tried to gag herself to get it out and is unable to , pt is able to move air

## 2016-01-12 NOTE — Progress Notes (Signed)
  Radiation Oncology         [phone]) [phone] ________________________________  Initial Outpatient Consultation - Date: 01/12/2016   Name: [name] MRN: [id number]   DOB: 1941-06-30  REFERRING PHYSICIAN: Rolm Bookbinder, MD  DIAGNOSIS AND STAGE: No matching staging information was found for the patient.  Breast cancer of upper-outer quadrant of right female breast.  HISTORY OF PRESENT ILLNESS::[name] is a 75 y.o. female who presents today after an abnormal mammogram. She had a diagnostic mammogram and ultrasound 12/31/2015, revealing a large, irregular hypoechoic mass at the 10 o'clock position of the right breast measuring 4.9 x 1.5 x 4.8 cm. There was another mass in the right breast in the 5 o'clock position 2 CFN measuring 1.1 x  0.7 x 1.0 cm lobular hypoechoic rounded mass. There was no right or left axillary lymphadenopathy. 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 She is here today to discuss radiation treatment options.  PREVIOUS RADIATION THERAPY: No  Past medical, social and family history were reviewed in the electronic chart. Review of symptoms was reviewed in the electronic chart. Medications were reviewed in the electronic chart.   PHYSICAL EXAM: There were no vitals filed for this visit.. .   Vitals with BMI 01/12/2016  Height _0   Weight 130 lbs 6 oz  BMI 11.9  Systolic 417  Diastolic 70  Pulse 67  Respirations 18   Gynecologic History  Age at first menstrual period? 14  Are you still having periods? No  If you no longer have periods: Have you used hormone replacement? No Obstetric History:  How many children have you carried to term? 5 Your age at first live birth? 70  Pregnant now or trying to get pregnant? No  Have you used birth control pills or hormone shots for contraception? Yes  If so, for how long (or approximate  dates)? 4 years (late 24's)  Would you be interested in learning more about the options to preserve fertility? No Health Maintenance:  Have you ever had a colonoscopy? Yes If yes, date? 1990's  Have you ever had a bone density? No  Date of your FIRST mammogram? 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 She is a good candidate for a bilateral mastectomy and sentinel lymph node biopsy and oncotype testing.  PLAN: She will be getting a bilateral mastectomy and does not need radiation at this time. We discussed this and she understands.    I spent 20 minutes  face to face with the patient and more than 50% of that time was spent in counseling and/or coordination of care.   ------------------------------------------------  Thea Silversmith, MD    This document serves as a record of services personally performed by Thea Silversmith, MD. It was created on her behalf by  Lendon Collar, a trained medical scribe. The creation of this record is based on the scribe's personal observations and the provider's statements to them. This document has been checked and approved by the attending provider.

## 2016-01-12 NOTE — Discharge Instructions (Signed)

## 2016-01-12 NOTE — Patient Instructions (Signed)

## 2016-01-12 NOTE — Progress Notes (Signed)
**Note Melanie-Identified via Obfuscation** [place]  [phone] [phone]     ID: [name] DOB: 21-Dec-1940  MR#: [id number]  FUX#:[id number]  Patient Care Team: Josetta Huddle, MD as PCP - General (Internal Medicine) Chauncey Cruel, MD as Consulting Physician (Oncology) Rolm Bookbinder, MD as Consulting Physician (General Surgery) Thea Silversmith, MD as Consulting Physician (Radiation Oncology) PCP: Henrine Screws, MD OTHER MD:  CHIEF COMPLAINT: Estrogen receptor positive invasive breast cancer  CURRENT TREATMENT: Awaiting definitive surgery   BREAST CANCER HISTORY: The patient had noticed a change in her right breast since approximately March 2017. This came to to medical attention during a recent exam and on 12/31/2015 the patient underwent bilateral diagnostic mammography with tomography and bilateral ultrasound at the [facility]. This found the breast density to be category C. There were pleomorphic calcifications over 3 separate quadrants of the right breast as well as a large area of irregular density in the upper outer quadrant. There were also heterogeneous calcifications throughout the left breast. There was an obscured mass in the subareolar left breast area upper outer quadrant as well.   Ultrasonography on the right confirmed a large irregular hypoechoic mass in the upper outer quadrant measuring 4.9 cm. There was a second lower outer quadrant mass measuring 1.1 cm. There was no right axillary lymphadenopathy. On the left side there was a 0.7 cm cyst at the 2:00 position. There was no left axillary lymphadenopathy.  On 01/05/2016 the patient underwent right breast upper outer quadrant biopsy, confirming an invasive ductal carcinoma, associated with significant extracellular mucin, grade 2 estrogen and progesterone receptor strongly positive, both at 95%, with an MIB-1 of 20%, and no HER-2 amplification, the signals ratio being 1.24 and the number per cell 2.05. On  the left side biopsy showed a complex sclerosing lesion.  Her subsequent history is as detailed below.  INTERVAL HISTORY: [name] was evaluated in the multidisciplinary breast cancer clinic  01/12/2016 accompanied by her friend and neighbor [name]. Her case was also presented in the multidisciplinary breast cancer conference that same morning. At that time a preliminary plan was proposed: Consider bilateral mastectomies followed by anti-estrogen therapy. MRI was to be considered.  REVIEW OF SYSTEMS: There were no specific symptoms leading to the original mammogram, which was routinely scheduled. The patient denies unusual headaches, visual changes, nausea, vomiting, stiff neck, dizziness, or gait imbalance. There has been no cough, phlegm production, or pleurisy, no chest pain or pressure, and no change in bowel or bladder habits. The patient denies fever, rash, bleeding, unexplained fatigue or unexplained weight loss. She complains of hearing loss, heartburn, easy bruising, arthritis, with no area being more intensively or persistently painful unusual, and occasional headaches. A detailed review of systems was otherwise entirely negative.  PAST MEDICAL HISTORY: Past Medical History  Diagnosis Date  . Hypertension   . Gout   . Closed left hip fracture ([place]) 5/14  . Alcohol abuse   . GERD (gastroesophageal reflux disease)   . Vitamin D deficiency   . Vitamin B12 deficiency   . Depression   . Cerebrovascular accident (stroke) ([place]) 2/07    left frontal lobe  . Dupuytren's contracture   . Bacterial vaginosis   . Hyperlipidemia LDL goal <70   . Fibrocystic breast disease   . Paroxysmal a-fib ([place])   . Renal calculus   . Fracture of metatarsal bone of right foot     5th metatarsal    PAST SURGICAL HISTORY: Past Surgical History  Procedure Laterality Date  .  Leg surgery    . Wrist surgery      left wrist  . Hip arthroplasty Left 12/17/2012    Procedure: LEFT HIP HEMI ARTHROPLASTY ;   Surgeon: Rozanna Box, MD;  Location: [facility];  Service: Orthopedics;  Laterality: Left;  . Cataracts      FAMILY HISTORY Family History  Problem Relation Age of Onset  . Stroke Mother   The patient's father died from a stroke at age 27. The patient's mother died from a stroke at age 32. [name] had 2 brothers, 6 sisters. The only cancer in her family she is aware of is her own daughter, who was diagnosed with kidney cancer in her 59s  GYNECOLOGIC HISTORY:  No LMP recorded. Patient is postmenopausal. Menarche age 62, first live birth age 49, the patient is [place] P5. She stopped having periods "a long time ago". She never took hormone replacement. She did take oral contraception approximately 4 years in the late 1970s.  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 She is widowed. At home currently her son [name] is staying with her but he usually moves between [place] and [place]. He works in Architect. A second son [name] also lives in [place] and works as a Development worker, community. Daughter [name] lives in [place] and works as a Quarry manager. Son [name] lives in [place] where he works as a Dealer. [name] lives in [place] and works in yard maintenance. The patient has 9 grandchildren. The patient is a [place]    ADVANCED DIRECTIVES: In place; the patient has named her daughter [name] as her healthcare power of attorney. She was not able to give [place] her daughter's phone number at the visit 01/12/2016 a bit to the right   HEALTH MAINTENANCE: Social History  Substance Use Topics  . Smoking status: Never Smoker   . Smokeless tobacco: Not on file  . Alcohol Use: 8.4 oz/week    14 Cans of beer per week     Comment: daily     Colonoscopy: 1990s?  PAP: Does not recall  Bone density: Never  Lipid panel:  Allergies  Allergen Reactions  . Ambien [Zolpidem Tartrate]     Causes nightmares    Current Outpatient Prescriptions    Medication Sig Dispense Refill  . amLODipine (NORVASC) 5 MG tablet Take 5 mg by mouth daily.    Marland Kitchen aspirin EC 325 MG tablet Take 1 tablet (325 mg total) by mouth daily. 30 tablet 0  . atenolol (TENORMIN) 25 MG tablet Take 1 tablet (25 mg total) by mouth daily. 30 tablet 0  . cholecalciferol (VITAMIN D) 1000 UNITS tablet Take 1,000 Units by mouth daily.    . folic acid (FOLVITE) 1 MG tablet Take 1 tablet (1 mg total) by mouth daily. 30 tablet 0  . lactose free nutrition (BOOST PLUS) LIQD Take 237 mLs by mouth 2 (two) times daily between meals.    . Multiple Vitamin (MULTIVITAMIN WITH MINERALS) TABS tablet Take 1 tablet by mouth daily.    . simvastatin (ZOCOR) 20 MG tablet Take 1 tablet (20 mg total) by mouth daily at 6 PM. 30 tablet 0  . thiamine 100 MG tablet Take 1 tablet (100 mg total) by mouth daily. 30 tablet 0  . ibuprofen (ADVIL) 200 MG tablet Take 400 mg by mouth every 6 (six) hours as needed for fever, headache or mild pain. Reported on 01/12/2016     No current facility-administered medications for  this visit.    OBJECTIVE: 75 year old woman who appears older than stated age 61 Vitals:   01/12/16 1254  BP: 145/70  Pulse: 67  Temp: 98.7 F (37.1 C)  Resp: 18     Body mass index is 18.71 kg/(m^2).    ECOG FS:1 - Symptomatic but completely ambulatory  Ocular: Sclerae unicteric, EOMs intact Ear-nose-throat: Oropharynx clear and moist Lymphatic: No cervical or supraclavicular adenopathy Lungs no rales or rhonchi, fair excursion bilaterally Heart regular rate and rhythm, no murmur appreciated Abd soft, nontender, positive bowel sounds MSK no focal spinal tenderness, no joint edema Neuro: non-focal, well-oriented, appropriate affect Breasts: The right breast is status post recent biopsy. There is a moderate ecchymosis. There are no skin or nipple changes of concern. The right axilla is benign. The left breast is unremarkable.  LAB RESULTS:  CMP     Component Value  Date/Time   NA 138 01/12/2016 1205   NA 136 09/11/2014 2157   K 4.4 01/12/2016 1205   K 3.7 09/11/2014 2157   CL 100 09/11/2014 2157   CO2 27 01/12/2016 1205   CO2 24 09/11/2014 2157   GLUCOSE 137 01/12/2016 1205   GLUCOSE 132* 09/11/2014 2157   BUN 7.4 01/12/2016 1205   BUN 7 09/11/2014 2157   CREATININE 0.9 01/12/2016 1205   CREATININE 0.84 09/11/2014 2157   CALCIUM 9.4 01/12/2016 1205   CALCIUM 8.4 09/11/2014 2157   CALCIUM 8.1* 12/18/2012 1419   PROT 7.4 01/12/2016 1205   PROT 6.6 09/11/2014 2157   ALBUMIN 3.6 01/12/2016 1205   ALBUMIN 3.6 09/11/2014 2157   AST 19 01/12/2016 1205   AST 16 09/11/2014 2157   ALT 12 01/12/2016 1205   ALT 11 09/11/2014 2157   ALKPHOS 108 01/12/2016 1205   ALKPHOS 73 09/11/2014 2157   BILITOT 0.81 01/12/2016 1205   BILITOT 0.3 09/11/2014 2157   GFRNONAA 67* 09/11/2014 2157   GFRAA 77* 09/11/2014 2157    INo results found for: SPEP, UPEP  Lab Results  Component Value Date   WBC 10.7* 01/12/2016   NEUTROABS 8.3* 01/12/2016   HGB 16.8* 01/12/2016   HCT 51.0* 01/12/2016   MCV 97.2 01/12/2016   PLT 285 01/12/2016      Chemistry      Component Value Date/Time   NA 138 01/12/2016 1205   NA 136 09/11/2014 2157   K 4.4 01/12/2016 1205   K 3.7 09/11/2014 2157   CL 100 09/11/2014 2157   CO2 27 01/12/2016 1205   CO2 24 09/11/2014 2157   BUN 7.4 01/12/2016 1205   BUN 7 09/11/2014 2157   CREATININE 0.9 01/12/2016 1205   CREATININE 0.84 09/11/2014 2157      Component Value Date/Time   CALCIUM 9.4 01/12/2016 1205   CALCIUM 8.4 09/11/2014 2157   CALCIUM 8.1* 12/18/2012 1419   ALKPHOS 108 01/12/2016 1205   ALKPHOS 73 09/11/2014 2157   AST 19 01/12/2016 1205   AST 16 09/11/2014 2157   ALT 12 01/12/2016 1205   ALT 11 09/11/2014 2157   BILITOT 0.81 01/12/2016 1205   BILITOT 0.3 09/11/2014 2157       No results found for: LABCA2  No components found for: [id number]  No results for input(s): INR in the last 168  hours.  Urinalysis    Component Value Date/Time   COLORURINE YELLOW 09/11/2014 2309   APPEARANCEUR CLEAR 09/11/2014 2309   LABSPEC 1.007 09/11/2014 2309   PHURINE 5.0 09/11/2014 [place] 09/11/2014 2309  HGBUR NEGATIVE 09/11/2014 [place] 09/11/2014 2309   KETONESUR 15* 09/11/2014 2309   PROTEINUR NEGATIVE 09/11/2014 2309   UROBILINOGEN 0.2 09/11/2014 2309   NITRITE NEGATIVE 09/11/2014 2309   LEUKOCYTESUR NEGATIVE 09/11/2014 2309      ELIGIBLE FOR AVAILABLE RESEARCH PROTOCOL: consider PALLAS trial  STUDIES: Mm Digital Diagnostic Bilat  01/05/2016  CLINICAL DATA:  Patient status post stereotactic guided core needle biopsy left breast calcifications and ultrasound-guided core needle biopsy right breast mass EXAM: DIAGNOSTIC BILATERAL MAMMOGRAM POST ULTRASOUND BIOPSY RIGHT BREAST AND STEREOTACTIC GUIDED BIOPSY LEFT BREAST COMPARISON:  Previous exam(s). FINDINGS: Mammographic images were obtained following ultrasound guided biopsy of right breast mass 10 o'clock position. Ribbon shaped marking clip in appropriate position. Cylinder-shaped marking clip in appropriate position medial left breast status post stereotactic guided core needle biopsy. IMPRESSION: Appropriate position cylinder-shaped marking clip medial left breast status post stereotactic guided core needle biopsy. Appropriate position ribbon shaped marking clip right breast 10 o'clock position status post ultrasound-guided core needle biopsy. Final Assessment: Post Procedure Mammograms for Marker Placement Electronically Signed   By: Lovey Newcomer M.D.   On: 01/05/2016 13:50   US Breast Ltd Uni Left Inc Axilla  12/31/2015  CLINICAL DATA:  Patient presents for evaluation of palpable abnormality within the upper-outer right breast. EXAM: DIGITAL DIAGNOSTIC BILATERAL MAMMOGRAM WITH CAD ULTRASOUND BILATERAL BREAST COMPARISON:  None ACR Breast Density Category c: The breast tissue is heterogeneously  dense, which may obscure small masses. FINDINGS: Magnification CC and true lateral views of the right breast demonstrate pleomorphic calcifications throughout the upper-outer, lower outer and lower inner quadrants of the right breast. There is a large area of irregular density throughout upper-outer right breast with associated distortion. Magnification CC and true lateral views demonstrate coarse heterogeneous calcifications throughout the left breast. Additionally there is an obscured mass within the subareolar left breast upper outer quadrant. Mammographic images were processed with CAD. On physical exam, I palpate a firm mass within the upper-outer right breast. No discrete subareolar left breast mass is palpated. Targeted ultrasound is performed, showing a large irregular predominately hypoechoic mass within the right breast upper outer quadrant (10 o'clock position) measuring 4.9 x 1.5 x 4.8 cm. Additionally within the right breast 5 o'clock position 2 cm from the nipple there is a 1.1 x 0.7 x 1.0 cm lobular hypoechoic rounded mass. No right axillary lymphadenopathy is identified. Within the left breast 2 o'clock position 1 cm from nipple there is a 6 x 3 x 7 mm cyst. No left axillary lymphadenopathy. IMPRESSION: Suspicious palpable mass throughout the upper-outer right breast. 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 Suspicious coarse heterogeneous calcifications throughout the left breast. RECOMMENDATION: Ultrasound-guided core needle biopsy large right breast mass. Stereotactic guided core needle biopsy of an area of the diffuse coarse heterogeneous left breast calcifications. Additionally, after pathologic diagnosis, bilateral breast MRI is recommended given the complexity and multitude of findings within the left breast. I have discussed the findings and recommendations with the patient. Results were also provided in writing  at the conclusion of the visit. If applicable, a reminder letter will be sent to the patient regarding the next appointment. BI-RADS CATEGORY  5: Highly suggestive of malignancy. Electronically Signed   By: Lovey Newcomer M.D.   On: 12/31/2015 16:52   US Breast Ltd Uni Right Inc Axilla  12/31/2015  CLINICAL DATA:  Patient presents for evaluation of palpable abnormality within the upper-outer right  breast. EXAM: DIGITAL DIAGNOSTIC BILATERAL MAMMOGRAM WITH CAD ULTRASOUND BILATERAL BREAST COMPARISON:  None ACR Breast Density Category c: The breast tissue is heterogeneously dense, which may obscure small masses. FINDINGS: Magnification CC and true lateral views of the right breast demonstrate pleomorphic calcifications throughout the upper-outer, lower outer and lower inner quadrants of the right breast. There is a large area of irregular density throughout upper-outer right breast with associated distortion. Magnification CC and true lateral views demonstrate coarse heterogeneous calcifications throughout the left breast. Additionally there is an obscured mass within the subareolar left breast upper outer quadrant. Mammographic images were processed with CAD. On physical exam, I palpate a firm mass within the upper-outer right breast. No discrete subareolar left breast mass is palpated. Targeted ultrasound is performed, showing a large irregular predominately hypoechoic mass within the right breast upper outer quadrant (10 o'clock position) measuring 4.9 x 1.5 x 4.8 cm. Additionally within the right breast 5 o'clock position 2 cm from the nipple there is a 1.1 x 0.7 x 1.0 cm lobular hypoechoic rounded mass. No right axillary lymphadenopathy is identified. Within the left breast 2 o'clock position 1 cm from nipple there is a 6 x 3 x 7 mm cyst. No left axillary lymphadenopathy. IMPRESSION: Suspicious palpable mass throughout the upper-outer right breast. There is associated pleomorphic calcification throughout 3  quadrants of the right breast concerning for associated DCIS. Suspicious coarse heterogeneous calcifications throughout the left breast. RECOMMENDATION: Ultrasound-guided core needle biopsy large right breast mass. Stereotactic guided core needle biopsy of an area of the diffuse coarse heterogeneous left breast calcifications. Additionally, after pathologic diagnosis, bilateral breast MRI is recommended given the complexity and multitude of findings within the left breast. I have discussed the findings and recommendations with the patient. Results were also provided in writing at the conclusion of the visit. If applicable, a reminder letter will be sent to the patient regarding the next appointment. BI-RADS CATEGORY  5: Highly suggestive of malignancy. Electronically Signed   By: Lovey Newcomer M.D.   On: 12/31/2015 16:52   Mm Diag Breast Tomo Bilateral  12/31/2015  CLINICAL DATA:  Patient presents for evaluation of palpable abnormality within the upper-outer right breast. EXAM: DIGITAL DIAGNOSTIC BILATERAL MAMMOGRAM WITH CAD ULTRASOUND BILATERAL BREAST COMPARISON:  None ACR Breast Density Category c: The breast tissue is heterogeneously dense, which may obscure small masses. FINDINGS: Magnification CC and true lateral views of the right breast demonstrate pleomorphic calcifications throughout the upper-outer, lower outer and lower inner quadrants of the right breast. There is a large area of irregular density throughout upper-outer right breast with associated distortion. Magnification CC and true lateral views demonstrate coarse heterogeneous calcifications throughout the left breast. Additionally there is an obscured mass within the subareolar left breast upper outer quadrant. Mammographic images were processed with CAD. On physical exam, I palpate a firm mass within the upper-outer right breast. No discrete subareolar left breast mass is palpated. Targeted ultrasound is performed, showing a large irregular  predominately hypoechoic mass within the right breast upper outer quadrant (10 o'clock position) measuring 4.9 x 1.5 x 4.8 cm. Additionally within the right breast 5 o'clock position 2 cm from the nipple there is a 1.1 x 0.7 x 1.0 cm lobular hypoechoic rounded mass. No right axillary lymphadenopathy is identified. Within the left breast 2 o'clock position 1 cm from nipple there is a 6 x 3 x 7 mm cyst. No left axillary lymphadenopathy. IMPRESSION: Suspicious palpable mass throughout the upper-outer right breast. 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 Suspicious coarse heterogeneous calcifications throughout the left breast. RECOMMENDATION: Ultrasound-guided core needle biopsy large right breast mass. Stereotactic guided core needle biopsy of an area of the diffuse coarse heterogeneous left breast calcifications. Additionally, after pathologic diagnosis, bilateral breast MRI is recommended given the complexity and multitude of findings within the left breast. I have discussed the findings and recommendations with the patient. Results were also provided in writing at the conclusion of the visit. If applicable, a reminder letter will be sent to the patient regarding the next appointment. BI-RADS CATEGORY  5: Highly suggestive of malignancy. Electronically Signed   By: Lovey Newcomer M.D.   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 The left breast revealed a complex sclerosing lesion with calcifications. Excision is recommended. This was found to be concordant by Dr. Lovey Newcomer. Pathology results were discussed with the patient by telephone. The patient reported doing well after the biopsies with tenderness and  bruising at the sites. Post biopsy instructions and care were reviewed and questions were answered. The patient was encouraged to call [facility] for any additional concerns. The patient was referred to the [facility] at the [facility] on Jan 12, 2016. Given the multitude of findings, bilateral breast MRI is recommended, particularly as the patient has no prior mammograms. The patient has extensive calcifications in the left breast, and an additional biopsy for extent should be considered depending upon desired treatment options and MRI findings. There are pleomorphic calcifications throughout 3 quadrants of the right breast most compatible with multicentric disease. An additional biopsy for extent should be considered if breast conservation is desired. Pathology results reported by Susa Raring RN, BSN on 01/06/2016. Electronically Signed   By: Lovey Newcomer M.D.   On: 01/06/2016 11:43  01/07/2016  CLINICAL DATA:  Patient with indeterminate left breast calcifications. EXAM: LEFT BREAST STEREOTACTIC CORE NEEDLE BIOPSY COMPARISON:  Previous exams. FINDINGS: The patient and I discussed the procedure of stereotactic-guided biopsy including benefits and alternatives. We discussed the high likelihood of a successful procedure. We discussed the risks of the procedure including infection, bleeding, tissue injury, clip migration, and inadequate sampling. Informed written consent was given. The usual time out protocol was performed immediately prior to the procedure. Using sterile technique and 1% Lidocaine as local anesthetic, under stereotactic guidance, a 9 gauge vacuum assisted core needle biopsy device was used to perform core needle biopsy of calcifications within the medial anterior left breast using a cranial approach. Specimen radiograph was performed showing calcifications. Specimens with calcifications are identified for pathology. At the  conclusion of the procedure, a cylinder-shaped tissue marker clip was deployed into the biopsy cavity. Follow-up 2-view mammogram was performed and dictated separately. IMPRESSION: Stereotactic-guided biopsy of left breast calcifications. No apparent complications. Electronically Signed: By: Lovey Newcomer M.D. 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 The left breast revealed a complex sclerosing lesion with calcifications. Excision is recommended. This was found to be concordant  by Dr. Lovey Newcomer. Pathology results were discussed with the patient by telephone. The patient reported doing well after the biopsies with tenderness and bruising at the sites. Post biopsy instructions and care were reviewed and questions were answered. The patient was encouraged to call [facility] for any additional concerns. The patient was referred to the [facility] at the [facility] on Jan 12, 2016. Given the multitude of findings, bilateral breast MRI is recommended, particularly as the patient has no prior mammograms. The patient has extensive calcifications in the left breast, and an additional biopsy for extent should be considered depending upon desired treatment options and MRI findings. There are pleomorphic calcifications throughout 3 quadrants of the right breast most compatible with multicentric disease. An additional biopsy for extent should be considered if breast conservation is desired. Pathology results reported by Susa Raring RN, BSN on 01/06/2016. Electronically Signed   By: Lovey Newcomer M.D.   On: 01/06/2016 11:43  01/07/2016  CLINICAL DATA:  Patient with suspicious right breast mass and calcifications. EXAM: ULTRASOUND  GUIDED RIGHT BREAST CORE NEEDLE BIOPSY COMPARISON:  Previous exam(s). FINDINGS: I met with the patient and we discussed the procedure of ultrasound-guided biopsy, including benefits and alternatives. We discussed the high likelihood of a successful procedure. We discussed the risks of the procedure, including infection, bleeding, tissue injury, clip migration, and inadequate sampling. Informed written consent was given. The usual time-out protocol was performed immediately prior to the procedure. Using sterile technique and 1% Lidocaine as local anesthetic, under direct ultrasound visualization, a 12 gauge spring-loaded device was used to perform biopsy of right breast mass 10 o'clock position using a lateral approach. At the conclusion of the procedure a ribbon shaped tissue marker clip was deployed into the biopsy cavity. Follow up 2 view mammogram was performed and dictated separately. IMPRESSION: Ultrasound guided biopsy of right breast mass 10 o'clock position. No apparent complications. Electronically Signed: By: Lovey Newcomer M.D. On: 01/05/2016 13:46    ASSESSMENT: 75 y.o. [place] woman status post right breast upper outer quadrant biopsy 01/05/2016 for a clinical T2 N0, stage IIA  invasive ductal carcinoma, grade 2, estrogen and progesterone receptor positive, HER-2 nonamplified, with an MIB-1 of 20%   (a) biopsy of suspicious calcifications in the left breast on the same day showed a complex sclerosing lesion   (1) bilateral mastectomies with right-sided sentinel lymph node sampling   (2) likely no adjuvant chemotherapy or adjuvant radiation  (3) adjuvant anti-estrogens to follow  (4) consider PALLAS trial  PLAN: We spent the better part of today's hour-long appointment discussing the biology of breast cancer in general, and the specifics of the patient's tumor in particular. As far as local treatment is concerned him a we discussed the fact that there is no survival advantage to mastectomy  as compared with lumpectomy plus radiation. In the BN's case however we do not see any possibility of keeping the right breast, which has multicentric disease. Similarly, in the left breast, while all we have documented is a complex sclerosing lesion, similar calcifications are scattered throughout the left breast and we would either have to do quite a number of biopsies or more likely attempt a lumpectomy and end up with positive margins requiring further surgery.  There was able to understand this discussion. She much prefers the idea of bilateral mastectomies 2 additional biopsies. She will have a sentinel lymph node biopsy only on the right side. She may  be able to forego radiation assuming margins are clear and lymph nodes are negative as they appear  As far as systemic therapy is concerned, she is not a candidate for anti-HER-2 immunotherapy, but is a very good candidate for anti-estrogens. The benefit of chemotherapy in her case is likely to be marginal. When one adds of the fact that she has an intermediate grade tumor and an intermediate grade and IV-1, and factors in her age and the fact that this is a mucin producing tumor, I think the likelihood of getting a significant benefit from chemotherapy is low enough that we will not send an Oncotype.  Accordingly the plan will be for definitive surgery and then I will see the patient to discuss the pathology results. I expect she will be able to start anti-estrogens immediately but of course if there is any question regarding need for further local treatment she will be referred back to radiation oncology  Kimberley has a good understanding of the overall plan. She agrees with it. She knows the goal of treatment in her case is cure. She will call with any problems that may develop before her next visit here.  Chauncey Cruel, MD   01/12/2016 5:32 PM Medical Oncology and Hematology [facility] [address] [place], [place]  [address] Tel. [phone]    Fax. [phone] I no(O1)

## 2016-01-12 NOTE — ED Provider Notes (Signed)
Medical screening examination/treatment/procedure(s) were conducted as a shared visit with non-physician practitioner(s) and myself.  I personally evaluated the patient during the encounter.   EKG Interpretation   Date/Time:  Wednesday Jan 12 2016 21:53:00 EDT Ventricular Rate:  64 PR Interval:  193 QRS Duration: 142 QT Interval:  448 QTC Calculation: 462 R Axis:   77 Text Interpretation:  Sinus rhythm Atrial premature complex Right bundle  branch block Baseline wander in lead(s) V3 V4 V6 No significant change  since last tracing Confirmed by Rechel Delosreyes  MD, Jelitza Manninen ([id number]) on 01/12/2016  10:39:16 PM     Patient here with formed by sensation after eating a hamburger. Is able to swallow her secretions. GI cocktail given here feels better. Suspect that she has esophagitis and will be discharged home  Lacretia Leigh, MD 01/12/16 2241

## 2016-01-12 NOTE — ED Provider Notes (Signed)
CSN: [id number]     Arrival date & time 01/12/16  1934 History   First MD Initiated Contact with Patient 01/12/16 2022     Chief Complaint  Patient presents with  . Foreign Body    [name] is a 75 y.o. female who presents to the ED complaining of a feeling of food stuck in her throat. The patient reports she was eating a hamburger around 10 AM this morning and she feels a piece of it became stuck in her throat. She reports she has pain from her throat into her chest. She reports moderate pain currently. She reports she tolerated water earlier without difficulty. She's had no nausea, vomiting or drooling. She's had no difficulty breathing. She does report that 10 years ago a dentist cause her to have some esophageal problems. She has had no further problems with her esophagus. She denies any previous foreign bodies or esophagus. She denies fevers, coughing, shortness of breath, palpitations, drooling, trouble breathing, tongue or lip swelling.  Patient is a 75 y.o. female presenting with foreign body. The history is provided by the patient. No language interpreter was used.  Foreign Body Associated symptoms: trouble swallowing   Associated symptoms: no abdominal pain, no congestion, no cough, no drooling, no ear discharge, no ear pain, no nausea, no sore throat, no voice change and no vomiting     Past Medical History  Diagnosis Date  . Hypertension   . Gout   . Closed left hip fracture ([place]) 5/14  . Alcohol abuse   . GERD (gastroesophageal reflux disease)   . Vitamin D deficiency   . Vitamin B12 deficiency   . Depression   . Cerebrovascular accident (stroke) ([place]) 2/07    left frontal lobe  . Dupuytren's contracture   . Bacterial vaginosis   . Hyperlipidemia LDL goal <70   . Fibrocystic breast disease   . Paroxysmal a-fib ([place])   . Renal calculus   . Fracture of metatarsal bone of right foot     5th metatarsal   Past Surgical History  Procedure Laterality Date  . Leg  surgery    . Wrist surgery      left wrist  . Hip arthroplasty Left 12/17/2012    Procedure: LEFT HIP HEMI ARTHROPLASTY ;  Surgeon: Rozanna Box, MD;  Location: [facility];  Service: Orthopedics;  Laterality: Left;  . Cataracts     Family History  Problem Relation Age of Onset  . Stroke Mother    Social History  Substance Use Topics  . Smoking status: Never Smoker   . Smokeless tobacco: None  . Alcohol Use: 8.4 oz/week    14 Cans of beer per week     Comment: daily   OB History    No data available     Review of Systems  Constitutional: Negative for fever.  HENT: Positive for trouble swallowing. Negative for congestion, drooling, ear discharge, ear pain, sore throat and voice change.   Eyes: Negative for visual disturbance.  Respiratory: Negative for cough, shortness of breath and wheezing.   Cardiovascular: Positive for chest pain. Negative for palpitations.  Gastrointestinal: Negative for nausea, vomiting, abdominal pain and diarrhea.  Genitourinary: Negative for dysuria.  Musculoskeletal: Negative for back pain and neck pain.  Skin: Negative for rash.  Neurological: Negative for headaches.      Allergies  Ambien  Home Medications   Prior to Admission medications   Medication Sig Start Date End Date Taking? Authorizing Provider  amLODipine (  NORVASC) 5 MG tablet Take 5 mg by mouth daily.   Yes Historical Provider, MD  aspirin EC 325 MG tablet Take 1 tablet (325 mg total) by mouth daily. 06/06/14  Yes Modena Jansky, MD  atenolol (TENORMIN) 25 MG tablet Take 1 tablet (25 mg total) by mouth daily. 06/06/14  Yes Modena Jansky, MD  cholecalciferol (VITAMIN D) 1000 UNITS tablet Take 1,000 Units by mouth daily.   Yes Historical Provider, MD  folic acid (FOLVITE) 1 MG tablet Take 1 tablet (1 mg total) by mouth daily. 06/06/14  Yes Modena Jansky, MD  lactose free nutrition (BOOST PLUS) LIQD Take 237 mLs by mouth 2 (two) times daily between meals. 06/06/14  Yes Modena Jansky, MD  Multiple Vitamin (MULTIVITAMIN WITH MINERALS) TABS tablet Take 1 tablet by mouth daily. 06/06/14  Yes Modena Jansky, MD  ibuprofen (ADVIL) 200 MG tablet Take 400 mg by mouth every 6 (six) hours as needed for fever, headache or mild pain. Reported on 01/12/2016    Historical Provider, MD  simvastatin (ZOCOR) 20 MG tablet Take 1 tablet (20 mg total) by mouth daily at 6 PM. Patient not taking: Reported on 01/12/2016 06/06/14   Modena Jansky, MD  thiamine 100 MG tablet Take 1 tablet (100 mg total) by mouth daily. Patient not taking: Reported on 01/12/2016 06/06/14   Modena Jansky, MD   BP 148/83 mmHg  Pulse 67  Temp(Src) 98.9 F (37.2 C) (Oral)  Resp 16  SpO2 98% Physical Exam  Constitutional: She appears well-developed and well-nourished. No distress.  Nontoxic appearing.  HENT:  Head: Normocephalic and atraumatic.  Mouth/Throat: Oropharynx is clear and moist.  Throat is clear. No evidence of foreign body. Uvula is midline without edema. No peritonsillar abscess. No tonsillar hypertrophy. No trismus. No drooling. Speech is clear and coherent. Tongue protrusion is normal.  Eyes: Conjunctivae are normal. Pupils are equal, round, and reactive to light. Right eye exhibits no discharge. Left eye exhibits no discharge.  Neck: Normal range of motion. Neck supple. No JVD present.  No stridor.  Cardiovascular: Normal rate, regular rhythm, normal heart sounds and intact distal pulses.  Exam reveals no gallop and no friction rub.   No murmur heard. Heart rate is 72.  Pulmonary/Chest: Effort normal and breath sounds normal. No stridor. No respiratory distress. She has no wheezes. She has no rales.  Lungs are clear to auscultation bilaterally. His work of breathing.  Abdominal: Soft. There is no tenderness. There is no guarding.  Musculoskeletal: She exhibits no edema.  Lymphadenopathy:    She has no cervical adenopathy.  Neurological: She is alert. Coordination normal.  Skin:  Skin is warm and dry. No rash noted. She is not diaphoretic. No erythema. No pallor.  Psychiatric: She has a normal mood and affect. Her behavior is normal.  Nursing note and vitals reviewed.   ED Course  Procedures (including critical care time) Labs Review Labs Reviewed - No data to display  Imaging Review No results found.    EKG Interpretation   Date/Time:  Wednesday Jan 12 2016 21:53:00 EDT Ventricular Rate:  64 PR Interval:  193 QRS Duration: 142 QT Interval:  448 QTC Calculation: 462 R Axis:   77 Text Interpretation:  Sinus rhythm Atrial premature complex Right bundle  branch block Baseline wander in lead(s) V3 V4 V6 No significant change  since last tracing Confirmed by ALLEN  MD, ANTHONY ([id number]) on 01/12/2016  10:39:16 PM  Filed Vitals:   01/12/16 2130 01/12/16 2200 01/12/16 2230 01/12/16 2246  BP: 150/84 159/97 148/83   Pulse: 69 69 67   Temp:    98.9 F (37.2 C)  TempSrc:    Oral  Resp: 15 14 23 16   SpO2: 100% 98% 98%      MDM   Meds given in ED:  Medications  gi cocktail (Maalox,Lidocaine,Donnatal) (30 mLs Oral Given 01/12/16 2152)    New Prescriptions   No medications on file    Final diagnoses:  Foreign body sensation in throat   This is a 75 y.o. female who presents to the ED complaining of a feeling of food stuck in her throat. The patient reports she was eating a hamburger around 10 AM this morning and she feels a piece of it became stuck in her throat. She reports she has pain from her throat into her chest. She reports moderate pain currently. She reports she tolerated water earlier without difficulty. She's had no nausea, vomiting or drooling. She's had no difficulty breathing. On exam the patient is afebrile nontoxic appearing. Her throat is clear. No peritonsillar abscess. No trismus. No drooling. Speech is clear and coherent. No stridor. Lungs are clear to auscultation bilaterally. She is handling her secretions without difficulty.  Patient was able to drink water while I was at bedside without any difficulty. EKG shows no significant change from her last tracing. The patient was provided with GI cocktail and had reevaluation reports she is feeling much better. She feels ready to go home. I discussed appropriate diet while she feels there is a foreign body sensation. I encouraged her to follow-up with gastroenterology and provided follow-up information at discharge. I discussed strict and specific return precautions. I advised the patient to follow-up with their primary care provider this week. I advised the patient to return to the emergency department with new or worsening symptoms or new concerns. The patient verbalized understanding and agreement with plan.    This patient was discussed with and evaluated by Dr. Zenia Resides who agrees with assessment and plan.    Waynetta Pean, PA-C 01/12/16 2248

## 2016-01-13 ENCOUNTER — Emergency Department (HOSPITAL_COMMUNITY)
Admission: EM | Admit: 2016-01-13 | Discharge: 2016-01-13 | Disposition: A | Discharge status: Home / Self Care | Payer: Medicare Other | Attending: Dermatology | Admitting: Dermatology

## 2016-01-13 ENCOUNTER — Other Ambulatory Visit: Payer: Self-pay | Admitting: General Surgery

## 2016-01-13 ENCOUNTER — Encounter (HOSPITAL_COMMUNITY): Payer: Self-pay | Admitting: Emergency Medicine

## 2016-01-13 DIAGNOSIS — I10 Essential (primary) hypertension: Secondary | ICD-10-CM | POA: Diagnosis not present

## 2016-01-13 DIAGNOSIS — Z5321 Procedure and treatment not carried out due to patient leaving prior to being seen by health care provider: Secondary | ICD-10-CM | POA: Insufficient documentation

## 2016-01-13 DIAGNOSIS — T18190A Other foreign object in esophagus causing compression of trachea, initial encounter: Secondary | ICD-10-CM | POA: Diagnosis not present

## 2016-01-13 DIAGNOSIS — Z8673 Personal history of transient ischemic attack (TIA), and cerebral infarction without residual deficits: Secondary | ICD-10-CM | POA: Insufficient documentation

## 2016-01-13 DIAGNOSIS — Y929 Unspecified place or not applicable: Secondary | ICD-10-CM | POA: Diagnosis not present

## 2016-01-13 DIAGNOSIS — Z96642 Presence of left artificial hip joint: Secondary | ICD-10-CM | POA: Insufficient documentation

## 2016-01-13 DIAGNOSIS — X58XXXA Exposure to other specified factors, initial encounter: Secondary | ICD-10-CM | POA: Insufficient documentation

## 2016-01-13 DIAGNOSIS — Z7982 Long term (current) use of aspirin: Secondary | ICD-10-CM | POA: Insufficient documentation

## 2016-01-13 DIAGNOSIS — Z79899 Other long term (current) drug therapy: Secondary | ICD-10-CM | POA: Diagnosis not present

## 2016-01-13 DIAGNOSIS — Y999 Unspecified external cause status: Secondary | ICD-10-CM | POA: Diagnosis not present

## 2016-01-13 DIAGNOSIS — C50411 Malignant neoplasm of upper-outer quadrant of right female breast: Secondary | ICD-10-CM

## 2016-01-13 DIAGNOSIS — Y939 Activity, unspecified: Secondary | ICD-10-CM | POA: Diagnosis not present

## 2016-01-13 NOTE — ED Notes (Signed)
No answer from waiting room.

## 2016-01-13 NOTE — ED Notes (Signed)
Seen yesterday for food stuck in throat, discharged home. Patient states she still feels like she can't swallow whatever was stuck in her throat. No airway obstruction, speaking clearly, able to maintain control of her saliva.

## 2016-01-13 NOTE — ED Notes (Signed)
Called to take to room  No response from lobby  

## 2016-01-13 NOTE — Addendum Note (Signed)
Addended by: Chauncey Cruel on: 01/13/2016 08:36 AM   Modules accepted: Orders

## 2016-01-13 NOTE — Progress Notes (Signed)
[facility] Psychosocial Distress Screening Spiritual Care  Met with [name] and a friend from church at [facility] to introduce [name] team/resources, reviewing distress screen per protocol.  The patient scored a 7 on the Psychosocial Distress Thermometer which indicates severe distress.  Also assessed for distress and other psychosocial needs.   ONCBCN DISTRESS SCREENING 01/13/2016  Screening Type Initial Screening  Distress experienced in past week (1-10) 7  Emotional problem type Nervousness/Anxiety;Adjusting to illness  Information Concerns Type Lack of info about diagnosis;Lack of info about treatment;Lack of info about complementary therapy choices;Lack of info about maintaining fitness  Physical Problem type Pain  Referral to support programs Yes  Other Spiritual Care   Per distress screen, pain was most distressing; in person, [name] reports that pain is decreasing.  Per pt, [facility] was very helpful, resolving information concerns and reducing her distress to a 2.  Normalized variety of feelings, as well as value of support through dx/tx. Pt did not identify particular need or interest at this time.  Follow up needed: No.  Pt and support person aware of ongoing Support Team availability, but please also page if needs arise/circumstances change.  Thank you.   [place], [place], [facility] Pager [phone] Voicemail [phone]

## 2016-01-13 NOTE — ED Notes (Signed)
Called for third time without response from lobby 

## 2016-01-18 ENCOUNTER — Telehealth: Payer: Self-pay | Admitting: Oncology

## 2016-01-18 ENCOUNTER — Telehealth: Payer: Self-pay | Admitting: *Deleted

## 2016-01-18 NOTE — Telephone Encounter (Signed)
Spoke with patient to confirm July appt date/time

## 2016-01-18 NOTE — Telephone Encounter (Signed)
  Oncology Nurse Navigator Documentation    Navigator Encounter Type: Telephone (01/18/16 1419) Telephone: Outgoing Call;Clinic/[facility] Follow-up (01/18/16 1419)     Surgery Date: 01/20/16 (01/18/16 1419) Treatment Initiated Date: 01/20/16 (01/18/16 1419)     Barriers/Navigation Needs: No barriers at this time;No Questions;No Needs (01/18/16 1419)   Interventions: Coordination of Care (01/18/16 1419)   Coordination of Care: Appts (Post op with Dr. Jana Hakim- POF placed) (01/18/16 1419)        Acuity: Level 2 (01/18/16 1419)         Time Spent with Patient: 15 (01/18/16 1419)

## 2016-01-19 ENCOUNTER — Encounter (HOSPITAL_COMMUNITY): Payer: Self-pay | Admitting: *Deleted

## 2016-01-19 NOTE — Progress Notes (Signed)
Pt denies cardiac history, chest pain or sob. 

## 2016-01-20 ENCOUNTER — Observation Stay (HOSPITAL_COMMUNITY)
Admission: RE | Admit: 2016-01-20 | Discharge: 2016-01-22 | Disposition: A | Discharge status: Home Health | Payer: Medicare Other | Source: Ambulatory Visit | Attending: General Surgery | Admitting: General Surgery

## 2016-01-20 ENCOUNTER — Ambulatory Visit (HOSPITAL_COMMUNITY): Payer: Medicare Other | Admitting: Anesthesiology

## 2016-01-20 ENCOUNTER — Encounter (HOSPITAL_COMMUNITY)
Admission: RE | Disposition: A | Discharge status: Home Health | Payer: Self-pay | Source: Ambulatory Visit | Attending: General Surgery

## 2016-01-20 ENCOUNTER — Encounter (HOSPITAL_COMMUNITY): Payer: Self-pay | Admitting: *Deleted

## 2016-01-20 ENCOUNTER — Ambulatory Visit (HOSPITAL_COMMUNITY)
Admission: RE | Admit: 2016-01-20 | Discharge: 2016-01-20 | Disposition: A | Discharge status: Home / Self Care | Payer: Medicare Other | Source: Ambulatory Visit | Attending: General Surgery | Admitting: General Surgery

## 2016-01-20 DIAGNOSIS — Z170 Estrogen receptor positive status [ER+]: Secondary | ICD-10-CM | POA: Insufficient documentation

## 2016-01-20 DIAGNOSIS — Z853 Personal history of malignant neoplasm of breast: Secondary | ICD-10-CM | POA: Diagnosis not present

## 2016-01-20 DIAGNOSIS — Z7982 Long term (current) use of aspirin: Secondary | ICD-10-CM | POA: Diagnosis not present

## 2016-01-20 DIAGNOSIS — N6092 Unspecified benign mammary dysplasia of left breast: Secondary | ICD-10-CM | POA: Insufficient documentation

## 2016-01-20 DIAGNOSIS — G4730 Sleep apnea, unspecified: Secondary | ICD-10-CM | POA: Diagnosis not present

## 2016-01-20 DIAGNOSIS — C50911 Malignant neoplasm of unspecified site of right female breast: Secondary | ICD-10-CM | POA: Diagnosis present

## 2016-01-20 DIAGNOSIS — R262 Difficulty in walking, not elsewhere classified: Secondary | ICD-10-CM | POA: Insufficient documentation

## 2016-01-20 DIAGNOSIS — N6012 Diffuse cystic mastopathy of left breast: Secondary | ICD-10-CM | POA: Diagnosis not present

## 2016-01-20 DIAGNOSIS — Z79899 Other long term (current) drug therapy: Secondary | ICD-10-CM | POA: Diagnosis not present

## 2016-01-20 DIAGNOSIS — R921 Mammographic calcification found on diagnostic imaging of breast: Secondary | ICD-10-CM | POA: Insufficient documentation

## 2016-01-20 DIAGNOSIS — C50919 Malignant neoplasm of unspecified site of unspecified female breast: Secondary | ICD-10-CM | POA: Diagnosis present

## 2016-01-20 DIAGNOSIS — Z96649 Presence of unspecified artificial hip joint: Secondary | ICD-10-CM | POA: Diagnosis not present

## 2016-01-20 DIAGNOSIS — I10 Essential (primary) hypertension: Secondary | ICD-10-CM | POA: Insufficient documentation

## 2016-01-20 DIAGNOSIS — C50411 Malignant neoplasm of upper-outer quadrant of right female breast: Principal | ICD-10-CM | POA: Insufficient documentation

## 2016-01-20 DIAGNOSIS — N6082 Other benign mammary dysplasias of left breast: Secondary | ICD-10-CM | POA: Diagnosis not present

## 2016-01-20 DIAGNOSIS — Z8673 Personal history of transient ischemic attack (TIA), and cerebral infarction without residual deficits: Secondary | ICD-10-CM | POA: Insufficient documentation

## 2016-01-20 DIAGNOSIS — G8918 Other acute postprocedural pain: Secondary | ICD-10-CM | POA: Diagnosis not present

## 2016-01-20 DIAGNOSIS — N6489 Other specified disorders of breast: Secondary | ICD-10-CM | POA: Diagnosis not present

## 2016-01-20 HISTORY — DX: Headache: R51

## 2016-01-20 HISTORY — PX: MASTECTOMY W/ SENTINEL NODE BIOPSY: SHX2001

## 2016-01-20 HISTORY — DX: Anemia, unspecified: D64.9

## 2016-01-20 HISTORY — DX: Headache, unspecified: R51.9

## 2016-01-20 HISTORY — DX: Unspecified osteoarthritis, unspecified site: M19.90

## 2016-01-20 HISTORY — DX: Other complications of anesthesia, initial encounter: T88.59XA

## 2016-01-20 HISTORY — DX: Adverse effect of unspecified anesthetic, initial encounter: T41.45XA

## 2016-01-20 HISTORY — DX: Malignant (primary) neoplasm, unspecified: C80.1

## 2016-01-20 SURGERY — MASTECTOMY WITH SENTINEL LYMPH NODE BIOPSY
Anesthesia: Regional | Site: Breast | Laterality: Bilateral

## 2016-01-20 MED ORDER — FENTANYL CITRATE (PF) 100 MCG/2ML IJ SOLN
INTRAMUSCULAR | Status: AC
  Administered 2016-01-20: 100 ug via INTRAVENOUS
  Administered 2016-01-20 (×3): 50 ug via INTRAVENOUS
  Filled 2016-01-20: qty 2

## 2016-01-20 MED ORDER — CEFAZOLIN SODIUM-DEXTROSE 2-4 GM/100ML-% IV SOLN
2.0000 g | INTRAVENOUS | Status: AC
  Administered 2016-01-20: 2 g via INTRAVENOUS
  Filled 2016-01-20: qty 100

## 2016-01-20 MED ORDER — OXYCODONE HCL 5 MG PO TABS
5.0000 mg | ORAL_TABLET | ORAL | Status: DC | PRN
  Administered 2016-01-20 – 2016-01-22 (×4): 5 mg via ORAL
  Filled 2016-01-20 (×2): qty 1
  Filled 2016-01-20: qty 2
  Filled 2016-01-20 (×2): qty 1

## 2016-01-20 MED ORDER — ONDANSETRON 4 MG PO TBDP
4.0000 mg | ORAL_TABLET | Freq: Four times a day (QID) | ORAL | Status: DC | PRN
  Administered 2016-01-22: 4 mg via ORAL
  Filled 2016-01-20: qty 1

## 2016-01-20 MED ORDER — LACTATED RINGERS IV SOLN
INTRAVENOUS | Status: DC
  Administered 2016-01-20 (×2): via INTRAVENOUS

## 2016-01-20 MED ORDER — ROCURONIUM BROMIDE 50 MG/5ML IV SOLN
INTRAVENOUS | Status: AC
  Filled 2016-01-20: qty 1

## 2016-01-20 MED ORDER — ATENOLOL 25 MG PO TABS
25.0000 mg | ORAL_TABLET | Freq: Every day | ORAL | Status: DC
  Administered 2016-01-21 – 2016-01-22 (×2): 25 mg via ORAL
  Filled 2016-01-20 (×2): qty 1

## 2016-01-20 MED ORDER — BUPIVACAINE-EPINEPHRINE (PF) 0.5% -1:200000 IJ SOLN
INTRAMUSCULAR | Status: DC | PRN
  Administered 2016-01-20 (×2): 15 mL via PERINEURAL

## 2016-01-20 MED ORDER — PROPOFOL 10 MG/ML IV BOLUS
INTRAVENOUS | Status: AC
  Filled 2016-01-20: qty 20

## 2016-01-20 MED ORDER — ONDANSETRON HCL 4 MG/2ML IJ SOLN
INTRAMUSCULAR | Status: AC
  Filled 2016-01-20: qty 2

## 2016-01-20 MED ORDER — GLYCOPYRROLATE 0.2 MG/ML IJ SOLN
INTRAMUSCULAR | Status: DC | PRN
Start: 2016-01-20 — End: 2016-01-20
  Administered 2016-01-20: 0.4 mg via INTRAVENOUS

## 2016-01-20 MED ORDER — MIDAZOLAM HCL 2 MG/2ML IJ SOLN
2.0000 mg | Freq: Once | INTRAMUSCULAR | Status: AC
  Administered 2016-01-20: 2 mg via INTRAVENOUS
  Filled 2016-01-20: qty 2

## 2016-01-20 MED ORDER — TECHNETIUM TC 99M SULFUR COLLOID FILTERED
1.0000 | Freq: Once | INTRAVENOUS | Status: AC | PRN
  Administered 2016-01-20: 1 via INTRADERMAL

## 2016-01-20 MED ORDER — HEMOSTATIC AGENTS (NO CHARGE) OPTIME
TOPICAL | Status: DC | PRN
  Administered 2016-01-20: 1 via TOPICAL

## 2016-01-20 MED ORDER — ACETAMINOPHEN 160 MG/5ML PO SOLN
325.0000 mg | ORAL | Status: DC | PRN
  Filled 2016-01-20: qty 20.3

## 2016-01-20 MED ORDER — ROCURONIUM BROMIDE 100 MG/10ML IV SOLN
INTRAVENOUS | Status: DC | PRN
  Administered 2016-01-20: 40 mg via INTRAVENOUS

## 2016-01-20 MED ORDER — MORPHINE SULFATE (PF) 2 MG/ML IV SOLN
2.0000 mg | INTRAVENOUS | Status: DC | PRN
  Administered 2016-01-20 – 2016-01-21 (×3): 2 mg via INTRAVENOUS
  Filled 2016-01-20 (×3): qty 1

## 2016-01-20 MED ORDER — ACETAMINOPHEN 500 MG PO TABS
1000.0000 mg | ORAL_TABLET | Freq: Four times a day (QID) | ORAL | Status: DC
  Administered 2016-01-21 – 2016-01-22 (×6): 1000 mg via ORAL
  Filled 2016-01-20 (×7): qty 2

## 2016-01-20 MED ORDER — OXYCODONE HCL 5 MG PO TABS: 5.0000 mg | ORAL_TABLET | Freq: Once | ORAL | Status: DC | PRN

## 2016-01-20 MED ORDER — 0.9 % SODIUM CHLORIDE (POUR BTL) OPTIME
TOPICAL | Status: DC | PRN
  Administered 2016-01-20: 1000 mL

## 2016-01-20 MED ORDER — FENTANYL CITRATE (PF) 100 MCG/2ML IJ SOLN
100.0000 ug | Freq: Once | INTRAMUSCULAR | Status: AC
  Administered 2016-01-20: 100 ug via INTRAVENOUS
  Filled 2016-01-20: qty 2

## 2016-01-20 MED ORDER — PROPOFOL 10 MG/ML IV BOLUS
INTRAVENOUS | Status: DC | PRN
  Administered 2016-01-20: 20 mg via INTRAVENOUS
  Administered 2016-01-20: 30 mg via INTRAVENOUS
  Administered 2016-01-20: 100 mg via INTRAVENOUS

## 2016-01-20 MED ORDER — ACETAMINOPHEN 325 MG PO TABS: 325.0000 mg | ORAL_TABLET | ORAL | Status: DC | PRN

## 2016-01-20 MED ORDER — MIDAZOLAM HCL 2 MG/2ML IJ SOLN
INTRAMUSCULAR | Status: AC
  Filled 2016-01-20: qty 2

## 2016-01-20 MED ORDER — ROPIVACAINE HCL 5 MG/ML IJ SOLN
INTRAMUSCULAR | Status: DC | PRN
  Administered 2016-01-20 (×2): 15 mL via PERINEURAL

## 2016-01-20 MED ORDER — FENTANYL CITRATE (PF) 250 MCG/5ML IJ SOLN
INTRAMUSCULAR | Status: AC
  Filled 2016-01-20: qty 5

## 2016-01-20 MED ORDER — SODIUM CHLORIDE 0.9 % IV SOLN
INTRAVENOUS | Status: DC
  Administered 2016-01-20: 17:00:00 via INTRAVENOUS

## 2016-01-20 MED ORDER — SCOPOLAMINE 1 MG/3DAYS TD PT72
MEDICATED_PATCH | TRANSDERMAL | Status: AC
  Filled 2016-01-20: qty 1

## 2016-01-20 MED ORDER — OXYCODONE HCL 5 MG/5ML PO SOLN: 5.0000 mg | Freq: Once | ORAL | Status: DC | PRN

## 2016-01-20 MED ORDER — NEOSTIGMINE METHYLSULFATE 10 MG/10ML IV SOLN
INTRAVENOUS | Status: DC | PRN
  Administered 2016-01-20: 2 mg via INTRAVENOUS

## 2016-01-20 MED ORDER — SODIUM CHLORIDE 0.9 % IJ SOLN
INTRAMUSCULAR | Status: AC
  Filled 2016-01-20: qty 10

## 2016-01-20 MED ORDER — DEXAMETHASONE SODIUM PHOSPHATE 10 MG/ML IJ SOLN
INTRAMUSCULAR | Status: DC | PRN
  Administered 2016-01-20: 10 mg via INTRAVENOUS

## 2016-01-20 MED ORDER — ONDANSETRON HCL 4 MG/2ML IJ SOLN
INTRAMUSCULAR | Status: DC | PRN
  Administered 2016-01-20: 4 mg via INTRAVENOUS

## 2016-01-20 MED ORDER — METHYLENE BLUE 0.5 % INJ SOLN
INTRAVENOUS | Status: AC
  Filled 2016-01-20: qty 10

## 2016-01-20 MED ORDER — DEXAMETHASONE SODIUM PHOSPHATE 10 MG/ML IJ SOLN
INTRAMUSCULAR | Status: AC
  Filled 2016-01-20: qty 1

## 2016-01-20 MED ORDER — ONDANSETRON HCL 4 MG/2ML IJ SOLN: 4.0000 mg | Freq: Four times a day (QID) | INTRAMUSCULAR | Status: DC | PRN

## 2016-01-20 MED ORDER — METHOCARBAMOL 500 MG PO TABS
500.0000 mg | ORAL_TABLET | Freq: Four times a day (QID) | ORAL | Status: DC | PRN
  Administered 2016-01-22: 500 mg via ORAL
  Filled 2016-01-20: qty 1

## 2016-01-20 MED ORDER — SCOPOLAMINE 1 MG/3DAYS TD PT72
MEDICATED_PATCH | TRANSDERMAL | Status: DC | PRN
  Administered 2016-01-20: 1 via TRANSDERMAL

## 2016-01-20 MED ORDER — FENTANYL CITRATE (PF) 100 MCG/2ML IJ SOLN: 25.0000 ug | INTRAMUSCULAR | Status: DC | PRN

## 2016-01-20 MED ORDER — AMLODIPINE BESYLATE 5 MG PO TABS
5.0000 mg | ORAL_TABLET | Freq: Every day | ORAL | Status: DC
  Administered 2016-01-21 – 2016-01-22 (×2): 5 mg via ORAL
  Filled 2016-01-20 (×2): qty 1

## 2016-01-20 SURGICAL SUPPLY — 56 items
APPLIER CLIP 9.375 MED OPEN (MISCELLANEOUS) ×3
APR CLP MED 9.3 20 MLT OPN (MISCELLANEOUS) ×1
BINDER BREAST LRG (GAUZE/BANDAGES/DRESSINGS) IMPLANT
BINDER BREAST XLRG (GAUZE/BANDAGES/DRESSINGS) IMPLANT
BIOPATCH RED 1 DISK 7.0 (GAUZE/BANDAGES/DRESSINGS) ×2 IMPLANT
BIOPATCH RED 1IN DISK 7.0MM (GAUZE/BANDAGES/DRESSINGS) ×2
CANISTER SUCTION 2500CC (MISCELLANEOUS) ×3 IMPLANT
CHLORAPREP W/TINT 26ML (MISCELLANEOUS) ×3 IMPLANT
CLIP APPLIE 9.375 MED OPEN (MISCELLANEOUS) IMPLANT
CLOSURE WOUND 1/2 X4 (GAUZE/BANDAGES/DRESSINGS) ×2
CONT SPEC 4OZ CLIKSEAL STRL BL (MISCELLANEOUS) ×3 IMPLANT
COVER PROBE W GEL 5X96 (DRAPES) ×3 IMPLANT
COVER SURGICAL LIGHT HANDLE (MISCELLANEOUS) ×3 IMPLANT
DRAIN CHANNEL 19F RND (DRAIN) ×3 IMPLANT
DRAPE LAPAROSCOPIC ABDOMINAL (DRAPES) ×3 IMPLANT
DRSG PAD ABDOMINAL 8X10 ST (GAUZE/BANDAGES/DRESSINGS) ×2 IMPLANT
DRSG TEGADERM 2-3/8X2-3/4 SM (GAUZE/BANDAGES/DRESSINGS) ×6 IMPLANT
ELECT BLADE 4.0 EZ CLEAN MEGAD (MISCELLANEOUS) ×3
ELECT CAUTERY BLADE 6.4 (BLADE) ×3 IMPLANT
ELECT REM PT RETURN 9FT ADLT (ELECTROSURGICAL) ×3
ELECTRODE BLDE 4.0 EZ CLN MEGD (MISCELLANEOUS) ×1 IMPLANT
ELECTRODE REM PT RTRN 9FT ADLT (ELECTROSURGICAL) ×1 IMPLANT
EVACUATOR SILICONE 100CC (DRAIN) ×3 IMPLANT
GAUZE SPONGE 4X4 12PLY STRL (GAUZE/BANDAGES/DRESSINGS) ×3 IMPLANT
GLOVE BIO SURGEON STRL SZ 6.5 (GLOVE) ×1 IMPLANT
GLOVE BIO SURGEON STRL SZ7 (GLOVE) ×3 IMPLANT
GLOVE BIO SURGEONS STRL SZ 6.5 (GLOVE) ×1
GLOVE BIOGEL PI IND STRL 6 (GLOVE) IMPLANT
GLOVE BIOGEL PI IND STRL 7.5 (GLOVE) ×1 IMPLANT
GLOVE BIOGEL PI INDICATOR 6 (GLOVE) ×2
GLOVE BIOGEL PI INDICATOR 7.5 (GLOVE) ×2
GOWN STRL REUS W/ TWL LRG LVL3 (GOWN DISPOSABLE) ×3 IMPLANT
GOWN STRL REUS W/TWL LRG LVL3 (GOWN DISPOSABLE) ×12
HEMOSTAT ARISTA ABSORB 3G PWDR (MISCELLANEOUS) ×2 IMPLANT
KIT BASIN OR (CUSTOM PROCEDURE TRAY) ×3 IMPLANT
KIT ROOM TURNOVER OR (KITS) ×3 IMPLANT
LIQUID BAND (GAUZE/BANDAGES/DRESSINGS) ×5 IMPLANT
MARKER SKIN DUAL TIP RULER LAB (MISCELLANEOUS) ×3 IMPLANT
NS IRRIG 1000ML POUR BTL (IV SOLUTION) ×3 IMPLANT
PACK GENERAL/GYN (CUSTOM PROCEDURE TRAY) ×3 IMPLANT
PAD ARMBOARD 7.5X6 YLW CONV (MISCELLANEOUS) ×3 IMPLANT
PIN SAFETY STERILE (MISCELLANEOUS) ×3 IMPLANT
SPECIMEN JAR X LARGE (MISCELLANEOUS) ×3 IMPLANT
STAPLER VISISTAT 35W (STAPLE) ×3 IMPLANT
STRIP CLOSURE SKIN 1/2X4 (GAUZE/BANDAGES/DRESSINGS) ×3 IMPLANT
SUT ETHILON 2 0 FS 18 (SUTURE) ×3 IMPLANT
SUT MNCRL AB 4-0 PS2 18 (SUTURE) ×6 IMPLANT
SUT MON AB 4-0 PC3 18 (SUTURE) ×3 IMPLANT
SUT SILK 2 0 SH (SUTURE) ×2 IMPLANT
SUT VIC AB 3-0 54X BRD REEL (SUTURE) ×1 IMPLANT
SUT VIC AB 3-0 BRD 54 (SUTURE) ×3
SUT VIC AB 3-0 SH 18 (SUTURE) ×9 IMPLANT
SUT VIC AB 3-0 SH 8-18 (SUTURE) ×3 IMPLANT
SYR CONTROL 10ML LL (SYRINGE) IMPLANT
TOWEL OR 17X24 6PK STRL BLUE (TOWEL DISPOSABLE) ×5 IMPLANT
TOWEL OR 17X26 10 PK STRL BLUE (TOWEL DISPOSABLE) ×3 IMPLANT

## 2016-01-20 NOTE — Anesthesia Procedure Notes (Addendum)
Anesthesia Regional Block:  Pectoralis block  Pre-Anesthetic Checklist: ,, timeout performed, Correct Patient, Correct Site, Correct Laterality, Correct Procedure, Correct Position, site marked, Risks and benefits discussed,  Surgical consent,  Pre-op evaluation,  At surgeon's request and post-op pain management  Laterality: Right  Prep: chloraprep       Needles:  Injection technique: Single-shot  Needle Type: Echogenic Stimulator Needle          Additional Needles:  Procedures: ultrasound guided (picture in chart) Pectoralis block Narrative:  Injection made incrementally with aspirations every 5 mL.  Performed by: Personally  Anesthesiologist: MOSER, CHRISTOPHER  Additional Notes: H+P and labs reviewed, risks and benefits discussed with patient, procedure tolerated well without complications   Anesthesia Regional Block:  Pectoralis block  Pre-Anesthetic Checklist: ,, timeout performed, Correct Patient, Correct Site, Correct Laterality, Correct Procedure, Correct Position, site marked, Risks and benefits discussed,  Surgical consent,  Pre-op evaluation,  At surgeon's request and post-op pain management  Laterality: Left  Prep: chloraprep       Needles:  Injection technique: Single-shot  Needle Type: Echogenic Stimulator Needle          Additional Needles:  Procedures: ultrasound guided (picture in chart) Pectoralis block Narrative:  Injection made incrementally with aspirations every 5 mL.  Performed by: Personally  Anesthesiologist: Oleta Mouse  Additional Notes: H+P and labs reviewed, risks and benefits discussed with patient, procedure tolerated well without complications   Procedure Name: Intubation Date/Time: 01/20/2016 12:43 PM Performed by: Ignacia Bayley Pre-anesthesia Checklist: Patient identified, Emergency Drugs available, Suction available and Patient being monitored Patient Re-evaluated:Patient Re-evaluated prior to  inductionOxygen Delivery Method: Circle system utilized Preoxygenation: Pre-oxygenation with 100% oxygen Intubation Type: IV induction Ventilation: Mask ventilation without difficulty Laryngoscope Size: Miller and 2 Grade View: Grade I Tube type: Oral Tube size: 7.5 mm Number of attempts: 1 Airway Equipment and Method: Stylet Placement Confirmation: ETT inserted through vocal cords under direct vision,  positive ETCO2 and breath sounds checked- equal and bilateral Secured at: 20 cm Tube secured with: Tape Dental Injury: Teeth and Oropharynx as per pre-operative assessment

## 2016-01-20 NOTE — Discharge Instructions (Signed)
[facility] °[phone] ° °MASTECTOMY: POST OP INSTRUCTIONS ° °Always review your discharge instruction sheet given to you by the facility where your surgery was performed. °IF YOU HAVE DISABILITY OR FAMILY LEAVE FORMS, YOU MUST BRING THEM TO THE OFFICE FOR PROCESSING.   °DO NOT GIVE THEM TO YOUR DOCTOR. °A prescription for pain medication may be given to you upon discharge.  Take your pain medication as prescribed, if needed.  If narcotic pain medicine is not needed, then you may take acetaminophen (Tylenol), naprosyn (Alleve) or ibuprofen (Advil) as needed. °1. Take your usually prescribed medications unless otherwise directed. °2. If you need a refill on your pain medication, please contact your pharmacy.  They will contact our office to request authorization.  Prescriptions will not be filled after 5pm or on week-ends. °3. You should follow a light diet the first few days after arrival home, such as soup and crackers, etc.  Resume your normal diet the day after surgery. °4. Most patients will experience some swelling and bruising on the chest and underarm.  Ice packs will help.  Swelling and bruising can take several days to resolve. Wear the binder day and night until you return to the office.  °5. It is common to experience some constipation if taking pain medication after surgery.  Increasing fluid intake and taking a stool softener (such as Colace) will usually help or prevent this problem from occurring.  A mild laxative (Milk of Magnesia or Miralax) should be taken according to package instructions if there are no bowel movements after 48 hours. °6. Unless discharge instructions indicate otherwise, leave your bandage dry and in place until your next appointment in 3-5 days.  You may take a limited sponge bath.  No tube baths or showers until the drains are removed.  You may have steri-strips (small skin tapes) in place directly over the incision.  These strips should be left on the  skin for 7-10 days. If you have glue it will come off in next couple week.  Any sutures will be removed at an office visit °7. DRAINS:  If you have drains in place, it is important to keep a list of the amount of drainage produced each day in your drains.  Before leaving the hospital, you should be instructed on drain care.  Call our office if you have any questions about your drains. I will remove your drains when they put out less than 30 cc or ml for 2 consecutive days. °8. ACTIVITIES:  You may resume regular (light) daily activities beginning the next day--such as daily self-care, walking, climbing stairs--gradually increasing activities as tolerated.  You may have sexual intercourse when it is comfortable.  Refrain from any heavy lifting or straining until approved by your doctor. °a. You may drive when you are no longer taking prescription pain medication, you can comfortably wear a seatbelt, and you can safely maneuver your car and apply brakes. °b. RETURN TO WORK:  __________________________________________________________ °9. You should see your doctor in the office for a follow-up appointment approximately 3-5 days after your surgery.  Your doctor’s nurse will typically make your follow-up appointment when she calls you with your pathology report.  Expect your pathology report 3-4business days after surgery. °10. OTHER INSTRUCTIONS: ______________________________________________________________________________________________ ____________________________________________________________________________________________ °WHEN TO CALL YOUR DR Kalob Bergen: °1. Fever over 101.0 °2. Nausea and/or vomiting °3. Extreme swelling or bruising °4. Continued bleeding from incision. °5. Increased pain, redness, or drainage from the incision. °The clinic staff is available   to answer your questions during regular business hours.  Please don’t hesitate to call and ask to speak to one of the nurses for clinical concerns.  If  you have a medical emergency, go to the nearest emergency room or call 911.  A surgeon from [facility] is always on call at the hospital. °[address], [address], [place], [place]  [address] ? [address] [address], [place], [place]   [address] °[phone] ? [phone] ? FAX [phone] °Web site: [email or web address] ° °

## 2016-01-20 NOTE — Progress Notes (Signed)
Patient arrived to 6N from PACU. Report received from [facility], [place].

## 2016-01-20 NOTE — Anesthesia Preprocedure Evaluation (Signed)
Anesthesia Evaluation  Patient identified by MRN, date of birth, ID band Patient awake    Reviewed: Allergy & Precautions, NPO status , Patient's Chart, lab work & pertinent test results, reviewed documented beta blocker date and time   History of Anesthesia Complications Negative for: history of anesthetic complications  Airway Mallampati: I  TM Distance: >3 FB Neck ROM: Full    Dental  (+) Edentulous Upper, Edentulous Lower   Pulmonary sleep apnea ,    breath sounds clear to auscultation       Cardiovascular hypertension, Pt. on medications and Pt. on home beta blockers (-) angina(-) CHF  Rhythm:Regular     Neuro/Psych  Headaches, PSYCHIATRIC DISORDERS Depression CVA, No Residual Symptoms    GI/Hepatic Neg liver ROS, GERD  ,  Endo/Other    Renal/GU negative Renal ROS     Musculoskeletal  (+) Arthritis ,   Abdominal   Peds  Hematology   Anesthesia Other Findings   Reproductive/Obstetrics                             Anesthesia Physical Anesthesia Plan  ASA: III  Anesthesia Plan: General and Regional   Post-op Pain Management:    Induction: Intravenous  Airway Management Planned: Oral ETT  Additional Equipment: None  Intra-op Plan:   Post-operative Plan: Extubation in OR  Informed Consent: I have reviewed the patients History and Physical, chart, labs and discussed the procedure including the risks, benefits and alternatives for the proposed anesthesia with the patient or authorized representative who has indicated his/her understanding and acceptance.   Dental advisory given  Plan Discussed with: CRNA and Surgeon  Anesthesia Plan Comments:         Anesthesia Quick Evaluation

## 2016-01-20 NOTE — Op Note (Signed)
Preoperative diagnosis: left breast csl with diffuse calcifications, right stage 1 breast cancer Postoperative diagnosis: same as above Procedure:  1. Right total mastectomy 2. Right axillary sentinel node biopsy 3. Left total mastectomy Surgeon: Dr Serita Grammes Anesthesia: general with bilateral pectoral block EBL: 50 cc Drains  1 19 Fr Blake drain on the left, 1 19 Fr blake drain on the right Specimens: right and left breast marked short superior long lateral and double deep, right axillary nodes Complications none Sponge and needle count correct dispo to recovery stable  Indications: This is a 89 yof with small breasts and a newly found right breast cancer and left breast CSL.  She has diffuse calcifications on both sides. We discussed all options.  She desired bilateral mastectomies and did not want to either discuss reconstruction or radiotherapy.   Procedure: After informed consent was obtained the patient was taken to the OR. She was given antibiotics and scds were in place. She had pectoral blocks. she had technetium injected in a periareolar fashion on the right.  She was placed under general anesthesia without complication. She was prepped and draped in the standard sterile surgical fashion. A timeout was performed.  I performed the left mastectomy first. I made an elliptical incision that encompassed a lot of extra skin as well as the NAC. I created flaps to remove all the breast tissue to the IM fold, parasternal region and the upper chest. I rolled this laterally off of the pectoralis muscle including the fascia to the latissimus dorsi. I then removed the breast and marked as above. I obtained hemostasis. I placed a single 19 Fr Blake drain and secured this with a 2-0 nylon suture.  I then closed the dermis with 3-0 vicryl.  I then closed the skin with 4-0 monocryl. Glue and steristrips were applied. I then turned my attention to the right side.   I made an elliptical  incision that encompassed a lot of extra skin as well as the NAC just as I did on the left . I created flaps to remove all the breast tissue to the IM fold, parasternal region and the upper chest. I rolled this laterally off of the pectoralis muscle including the fascia to the latissimus dorsi. I then removed the breast and marked as above. I then used the neoprobe to identify a small bundle of sentinel nodes with highest count of 971. There was no background radioactivity.  I obtained hemostasis. Irrigation was performed. I placed a 19 Fr Blake drain in the space. I closed the dermis with 3-0 vicryls andI then closed the skin with 4-0 monocryl. Glue and steristrips were applied. A binder was placed. She was extubated and then transferred to recovery stable.

## 2016-01-20 NOTE — Interval H&P Note (Signed)
History and Physical Interval Note:  01/20/2016 12:03 PM  [name]  has presented today for surgery, with the diagnosis of RIGHT BREAST CANCER, LEFT BREAST COMPLEX SCLEROSING LESION  The various methods of treatment have been discussed with the patient and family. After consideration of risks, benefits and other options for treatment, the patient has consented to  Procedure(s): BILATERAL TOTAL MASTECTOMY WITH RIGHT SENTINEL LYMPH NODE BIOPSY (Bilateral) as a surgical intervention .  The patient's history has been reviewed, patient examined, no change in status, stable for surgery.  I have reviewed the patient's chart and labs.  Questions were answered to the patient's satisfaction.     Adriannah Steinkamp

## 2016-01-20 NOTE — H&P (Signed)
  89 yof who has palpable right breast lump about 3 weeks ago that has remained unchanged. she has no prior breast history. she has no family history. she lives alone in [place] and her neighbor is with her today. she underwent mm that shows c density breasts. in the right uoq she has a 4.9x1.5x4.8 cm mass with additional pleomorphic calcifications in 3/4 quadrants of the breast. there are also similar calcs throughout the left breast. She underwent right biopsy that shows a grade I idc with dcis that is er/pr positive, her2 negative and Ki is 20%. left breast biopsy is a csl.   Problem List/Past Medical Rolm Bookbinder, MD; 01/13/2016 12:52 PM) HYPERTENSION (I10)  Past Surgical History Rolm Bookbinder, MD; 01/13/2016 12:52 PM) Hip Replacement, Total  Allergies Rolm Bookbinder, MD; 01/13/2016 12:52 PM) No Known Drug Allergies06/08/2015  Medication History Conni Slipper, RN; 01/12/2016 8:11 AM) AmLODIPine Besylate ('5MG'$  Tablet, Oral) Active. Atenolol ('25MG'$  Tablet, Oral) Active. Folic Acid ('1MG'$  Tablet, Oral) Active. Vitamin D3 (2000UNIT Capsule, Oral) Active. Zonegran ('25MG'$  Capsule, Oral) Active. Vitamin B-12 (100MCG Tablet, Oral) Active. Aspirin ('81MG'$  Tablet DR, Oral) Active. Multiple Vitamin (1 (one) Oral) Active. Simvastatin ('20MG'$  Tablet, Oral) Active. Thiamine HCl ('100MG'$  Tablet, Oral) Active. Medications Reconciled    Review of Systems Rolm Bookbinder MD; 01/13/2016 12:53 PM) All other systems negative   Physical Exam Rolm Bookbinder MD; 01/13/2016 12:47 PM) General Mental Status-Alert. Orientation-Oriented X3.  Chest and Lung Exam Chest and lung exam reveals -on auscultation, normal breath sounds, no adventitious sounds and normal vocal resonance.  Breast Nipples-No Discharge. Note: rouq vague mass with hematoma, thick breast tissue bilaterally   Cardiovascular Cardiovascular examination reveals -normal heart sounds, regular rate and  rhythm with no murmurs.  Lymphatic Head & Neck  General Head & Neck Lymphatics: Bilateral - Description - Normal. Axillary  General Axillary Region: Bilateral - Description - Normal. 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 She has large area on the right side with proven cancer. she has very small breast size and I think to clear this she will need mastectomy. she does not desire reconstruction. discussed mastectomy, recovery, appearance and complications . will also do a sn biopsy on this side. discussed small risk of lymphedema associated with this also. She will have drain in place postop I also think at minimum she needs excision of a csl. however there is a very large area of calcs that has not been sampled. after discussion we both agree that a mastectomy on the left is reasonable as well. RADIAL SCAR OF BREAST (N64.89)

## 2016-01-20 NOTE — Transfer of Care (Signed)
Immediate Anesthesia Transfer of Care Note  Patient: [name]  Procedure(s) Performed: Procedure(s): BILATERAL TOTAL MASTECTOMY WITH RIGHT SENTINEL LYMPH NODE BIOPSY (Bilateral)  Patient Location: PACU  Anesthesia Type:GA combined with regional for post-op pain  Level of Consciousness: sedated  Airway & Oxygen Therapy: Patient Spontanous Breathing and Patient connected to nasal cannula oxygen  Post-op Assessment: Report given to RN and Post -op Vital signs reviewed and stable  Post vital signs: stable  Last Vitals:  Filed Vitals:   01/20/16 1225 01/20/16 1430  BP:  147/69  Pulse: 73 68  Temp:  36.8 C  Resp: 16 11    Last Pain: There were no vitals filed for this visit.    Patients Stated Pain Goal: 2 ([id number])  Complications: No apparent anesthesia complications

## 2016-01-21 ENCOUNTER — Encounter (HOSPITAL_COMMUNITY): Payer: Self-pay | Admitting: General Surgery

## 2016-01-21 DIAGNOSIS — C50411 Malignant neoplasm of upper-outer quadrant of right female breast: Secondary | ICD-10-CM | POA: Diagnosis not present

## 2016-01-21 DIAGNOSIS — I10 Essential (primary) hypertension: Secondary | ICD-10-CM | POA: Diagnosis not present

## 2016-01-21 DIAGNOSIS — N6092 Unspecified benign mammary dysplasia of left breast: Secondary | ICD-10-CM | POA: Diagnosis not present

## 2016-01-21 DIAGNOSIS — Z170 Estrogen receptor positive status [ER+]: Secondary | ICD-10-CM | POA: Diagnosis not present

## 2016-01-21 DIAGNOSIS — R921 Mammographic calcification found on diagnostic imaging of breast: Secondary | ICD-10-CM | POA: Diagnosis not present

## 2016-01-21 DIAGNOSIS — N6489 Other specified disorders of breast: Secondary | ICD-10-CM | POA: Diagnosis not present

## 2016-01-21 MED ORDER — DIPHENHYDRAMINE HCL 25 MG PO CAPS
25.0000 mg | ORAL_CAPSULE | Freq: Every evening | ORAL | Status: DC | PRN
  Administered 2016-01-21: 25 mg via ORAL
  Filled 2016-01-21: qty 1

## 2016-01-21 NOTE — Care Management Obs Status (Signed)
MEDICARE OBSERVATION STATUS NOTIFICATION   Patient Details  Name: [name] MRN: [id number] Date of Birth: 1940-10-30   Medicare Observation Status Notification Given:  Yes (Medicare procedure)   Explained to Attending , patient and patient's son , Medicare will not cover SNF placement. Await PT eval . Consulted SW also. Marilu Favre, RN 01/21/2016, 11:57 AM

## 2016-01-21 NOTE — Progress Notes (Signed)
1 Day Post-Op  Subjective: Pain fair control, has been up in room, pt eval pending  Objective: Vital signs in last 24 hours: Temp:  [97.5 F (36.4 C)-98.6 F (37 C)] 98.1 F (36.7 C) (06/09 0442) Pulse Rate:  [65-76] 76 (06/09 0442) Resp:  [11-19] 19 (06/09 0442) BP: (132-173)/(68-95) 132/86 mmHg (06/09 0442) SpO2:  [92 %-98 %] 98 % (06/09 0442)    Intake/Output from previous day: 06/08 0701 - 06/09 0700 In: 1325 [P.O.:120; I.V.:1100] Out: 470 [Urine:275; Drains:95; Blood:100] Intake/Output this shift: Total I/O In: 240 [P.O.:240] Out: 75 [Drains:75]  Bilateral mastectomy incisions clean without infection, no drainage, no hematoma, drains serosang as expected   Anti-infectives: Anti-infectives    Start     Dose/Rate Route Frequency Ordered Stop   01/20/16 1015  ceFAZolin (ANCEF) IVPB 2g/100 mL premix     2 g 200 mL/hr over 30 Minutes Intravenous To Surgery 01/20/16 1011 01/20/16 1230      Assessment/Plan: POD 1 bilateral total mastectomies right ax sn biopsy  1. Po pain meds, robaxin, ambien for sleep per her request 2. pulm toilet 3. Pt eval 4. Regular diet, sliv 5. dispo pending pain control and sw/cm evaluation  [name] 01/21/2016

## 2016-01-21 NOTE — Clinical Social Work Note (Signed)
CSW received referral for patient needing SNF for short term rehab.  CSW discussed with patient and her family who were at bedside that patient is classified as observation status, and she will not be able to use Medicare benefits to pay for SNF.  CSW explained to patient and her family about how there is a difference between observation status and inpatient status.  CSW informed patient and her family that the other option would be to pay privately for room and board at a SNF, patient and family stated they can not afford to pay privately.  CSW explained to patient and her family the other option is go home with home health.  Patient's family is working on making arrangements for someone to stay with her, case manager was made aware and she also explained to patient's family the difference between observation and inpatient status per medicare guidelines, case manager notified physician.  CSW informed patient and her family that there are other managed Medicare Plans that the family can look into that will pay for SNF if they are observation status.  Patient's family was appreciative of information given to them, CSW informed the family if they want to consider other insurance plans, open enrollment is from October 15th till December 7th.  CSW to sign off please reconsult if other social work needs arise.  Jones Broom. [place], MSW, [place] 01/21/2016 3:30 PM

## 2016-01-21 NOTE — Evaluation (Signed)
Physical Therapy Evaluation Patient Details Name: [name] MRN: [id number] DOB: November 09, 1940 Today's Date: 01/21/2016   History of Present Illness  SP bilaateral mastectomies on 01/20/16  Clinical Impression  The patient is unsteady with ambulation with out Rw. Noted tremors/shaking of the hands. Per patient, the length of time that the son will be  Present to assist her is unknown. The son is not present.   Pt admitted with above diagnosis. Pt currently with functional limitations due to the deficits listed below (see PT Problem List). Pt will benefit from skilled PT to increase their independence and safety with mobility to allow discharge to the venue listed below.     Follow Up Recommendations Supervision/Assistance - 24 hour  SNF-patient could benefit from further  PT in skilled setting for balance and safety after DC.  HHPT if DC to home.    Equipment Recommendations  None recommended by PT    Recommendations for Other Services       Precautions / Restrictions Precautions Precautions: Fall Precaution Comments: bil drains Restrictions Weight Bearing Restrictions: No      Mobility  Bed Mobility Overal bed mobility: Modified Independent                Transfers Overall transfer level: Needs assistance Equipment used: Rolling walker (2 wheeled) Transfers: Sit to/from Stand Sit to Stand: Supervision         General transfer comment: cues for safety  Ambulation/Gait Ambulation/Gait assistance: Min assist;Min guard Ambulation Distance (Feet): 200 Feet Assistive device: Rolling walker (2 wheeled) Gait Pattern/deviations: Step-through pattern;Drifts right/left;Decreased dorsiflexion - left     General Gait Details: steady assist at times, veers to sides. Ambulated x 150' with no AD and required more steady assist during turning. Staggered when in room trying to mobilize in tight space, tangled on doorway with RW.  Stairs            Wheelchair Mobility     Modified Rankin (Stroke Patients Only)       Balance Overall balance assessment: Needs assistance Sitting-balance support: Feet supported;No upper extremity supported Sitting balance-Leahy Scale: Fair     Standing balance support: During functional activity;No upper extremity supported Standing balance-Leahy Scale: Poor                               Pertinent Vitals/Pain Pain Assessment: 0-10 Pain Location: chest Pain Descriptors / Indicators: Sore Pain Intervention(s): Monitored during session    Home Living Family/patient expects to be discharged to:: Private residence Living Arrangements: Alone Available Help at Discharge: Family;Available 24 hours/day Type of Home: Apartment Home Access: Level entry;Stairs to enter Entrance Stairs-Rails: None Entrance Stairs-Number of Steps: 2 Home Layout: One level Home Equipment: Walker - 2 wheels;Bedside commode;Shower seat Additional Comments: Son is here from [place], unkown how long he will stay    Prior Function Level of Independence: Independent         Comments: reports fully independent and driving     Hand Dominance        Extremity/Trunk Assessment   Upper Extremity Assessment: RUE deficits/detail;LUE deficits/detail RUE Deficits / Details: noted resting and intention tremors, especially with fine motor.     LUE Deficits / Details: same, finger 3 and 4 flexion contractures   Lower Extremity Assessment: Generalized weakness         Communication   Communication: No difficulties  Cognition Arousal/Alertness: Awake/alert Behavior During Therapy: WFL for tasks assessed/performed  Overall Cognitive Status: Impaired/Different from baseline Area of Impairment: Orientation Orientation Level: Time                  General Comments      Exercises        Assessment/Plan    PT Assessment Patient needs continued PT services  PT Diagnosis Difficulty walking;Generalized weakness    PT Problem List Decreased strength;Decreased activity tolerance;Decreased balance;Decreased mobility;Decreased knowledge of use of DME;Decreased safety awareness;Decreased knowledge of precautions;Pain  PT Treatment Interventions DME instruction;Gait training;Functional mobility training;Stair training;Therapeutic activities;Balance training   PT Goals (Current goals can be found in the Care Plan section) Acute Rehab PT Goals Patient Stated Goal: to go to rehab PT Goal Formulation: With patient Time For Goal Achievement: 02/04/16 Potential to Achieve Goals: Good    Frequency Min 3X/week   Barriers to discharge Decreased caregiver support      Co-evaluation               End of Session Equipment Utilized During Treatment: Gait belt Activity Tolerance: Patient tolerated treatment well Patient left: with call bell/phone within reach Nurse Communication: Mobility status    Functional Assessment Tool Used: clinical judgement Functional Limitation: Mobility: Walking and moving around Mobility: Walking and Moving Around Current Status [id number]): At least 20 percent but less than 40 percent impaired, limited or restricted Mobility: Walking and Moving Around Goal Status [phone]): 0 percent impaired, limited or restricted    Time: 1217-1239 PT Time Calculation (min) (ACUTE ONLY): 22 min   Charges:   PT Evaluation $PT Eval Low Complexity: 1 Procedure     PT G Codes:   PT G-Codes **NOT FOR INPATIENT CLASS** Functional Assessment Tool Used: clinical judgement Functional Limitation: Mobility: Walking and moving around Mobility: Walking and Moving Around Current Status [id number]): At least 20 percent but less than 40 percent impaired, limited or restricted Mobility: Walking and Moving Around Goal Status [phone]): 0 percent impaired, limited or restricted    Claretha Cooper 01/21/2016, 1:39 PM Tresa Endo PT [phone]

## 2016-01-21 NOTE — Anesthesia Postprocedure Evaluation (Signed)
Anesthesia Post Note  Patient: [name]  Procedure(s) Performed: Procedure(s) (LRB): BILATERAL TOTAL MASTECTOMY WITH RIGHT SENTINEL LYMPH NODE BIOPSY (Bilateral)  Patient location during evaluation: PACU Anesthesia Type: General Level of consciousness: awake Pain management: pain level controlled Vital Signs Assessment: post-procedure vital signs reviewed and stable Respiratory status: spontaneous breathing Cardiovascular status: stable Postop Assessment: no signs of nausea or vomiting Anesthetic complications: no    Last Vitals:  Filed Vitals:   01/20/16 2044 01/21/16 0442  BP: 134/79 132/86  Pulse: 76 76  Temp: 37 C 36.7 C  Resp: 19 19    Last Pain:  Filed Vitals:   01/21/16 1123  PainSc: 3                  Dayannara Pascal

## 2016-01-21 NOTE — Care Management Note (Signed)
Case Management Note  Patient Details  Name: [name] MRN: [id number] Date of Birth: 02-20-1941  Subjective/Objective:                    Action/Plan:  Social work and NCM explained observation status and Medicare again to patient and multiple family members at bedside. They are aware Medicare will not cover SNF. Insurance will pay for therapy at [facility] but not room and board . They stated they could not pay privately for SNF.   Patient has a daughter in [place] [name]) . Family to discuss patient staying with [name] or another family member who can provide 24 hour supervision .   Provided patient and family with private duty list and home health list for [place].     Called Dr Cristal Generous office spoke with Jearld Fenton , explained above . Will need home health orders for RN, PT, OT, aide and face to face. And call Case Manager to arrange. Expected Discharge Date:                  Expected Discharge Plan:  [place]  In-House Referral:     Discharge planning Services  CM Consult  Post Acute Care Choice:    Choice offered to:  Patient  DME Arranged:    DME Agency:     HH Arranged:    [place] Agency:     Status of Service:  In process, will continue to follow  Medicare Important Message Given:    Date Medicare IM Given:    Medicare IM give by:    Date Additional Medicare IM Given:    Additional Medicare Important Message give by:     If discussed at [place] of Stay Meetings, dates discussed:    Additional Comments:  Marilu Favre, RN 01/21/2016, 2:43 PM

## 2016-01-22 DIAGNOSIS — Z170 Estrogen receptor positive status [ER+]: Secondary | ICD-10-CM | POA: Diagnosis not present

## 2016-01-22 DIAGNOSIS — I10 Essential (primary) hypertension: Secondary | ICD-10-CM | POA: Diagnosis not present

## 2016-01-22 DIAGNOSIS — N6092 Unspecified benign mammary dysplasia of left breast: Secondary | ICD-10-CM | POA: Diagnosis not present

## 2016-01-22 DIAGNOSIS — R921 Mammographic calcification found on diagnostic imaging of breast: Secondary | ICD-10-CM | POA: Diagnosis not present

## 2016-01-22 DIAGNOSIS — C50411 Malignant neoplasm of upper-outer quadrant of right female breast: Secondary | ICD-10-CM | POA: Diagnosis not present

## 2016-01-22 DIAGNOSIS — N6489 Other specified disorders of breast: Secondary | ICD-10-CM | POA: Diagnosis not present

## 2016-01-22 MED ORDER — OXYCODONE HCL 5 MG PO TABS: 5.0000 mg | ORAL_TABLET | ORAL | Status: DC | PRN

## 2016-01-22 MED ORDER — METHOCARBAMOL 500 MG PO TABS: 500.0000 mg | ORAL_TABLET | Freq: Four times a day (QID) | ORAL | Status: DC | PRN

## 2016-01-22 MED ORDER — OXYCODONE-ACETAMINOPHEN 10-325 MG PO TABS: 1.0000 | ORAL_TABLET | Freq: Four times a day (QID) | ORAL | Status: DC | PRN

## 2016-01-22 NOTE — Care Management Note (Signed)
Case Management Note  Patient Details  Name: [name] MRN: [id number] Date of Birth: [birth year]  Subjective/Objective:                  BILATERAL TOTAL MASTECTOMY WITH RIGHT SENTINEL LYMPH NODE BIOPSY Action/Plan: Discharge planning Expected Discharge Date:                  Expected Discharge Plan:  [place]  In-House Referral:     Discharge planning Services  CM Consult  Post Acute Care Choice:    Choice offered to:  Patient  DME Arranged:  N/A DME Agency:  NA  HH Arranged:  RN, PT, OT, Nurse's Aide [place] Agency:  Other - See comment  Status of Service:  Completed, signed off  Medicare Important Message Given:    Date Medicare IM Given:    Medicare IM give by:    Date Additional Medicare IM Given:    Additional Medicare Important Message give by:     If discussed at [place] of Stay Meetings, dates discussed:    Additional Comments: CM met with pt and family to offer choice of home health agency in [place] is NOT returning to her home in [facility] and this CM explains limited choice in [place]; pt and family verbalize they do not have a preference and to set pt up with whatever agency I can find.  CM called [facility] at [facility] [phone] and spoke with Raquel Sarna who accepts referral for HHRN/PT/OT/aide and requests I fax facesheet, orders, face to face, H&P, OP note, and latest progress note.  Pt will be staying with Edmon Crape [phone] who resides at [address] [place] [address].  Pt states she has a rolling walker and a 3n1 and does not need additional DME.  No other CM needs were communicated. Dellie Catholic, RN 01/22/2016, 2:12 PM

## 2016-01-22 NOTE — Discharge Summary (Signed)
Physician Discharge Summary  Patient ID: [name] MRN: [id number] DOB/AGE: [birth year] 75 y.o.  Admit date: 01/20/2016 Discharge date: 01/22/2016  Admission Diagnoses: Breast cancer  Discharge Diagnoses:  Active Problems:   Breast cancer [facility])   Discharged Condition:good  Hospital Course: 26 yof s/p bilateral mastectomies with sn biopsy.  Has some pain issues postop that are controlled. Has seen pt and will be discharged home with home health and pt.  She has expected drainage output. Path is still pending  Consults: none  Significant Diagnostic Studies: none  Treatments: bilateral tm with sn biopsy  Disposition: 01-Home or Self Care     Medication List    TAKE these medications        ADVIL 200 MG tablet  Generic drug:  ibuprofen  Take 400 mg by mouth every 6 (six) hours as needed for fever, headache or mild pain. Reported on 01/12/2016     amLODipine 5 MG tablet  Commonly known as:  NORVASC  Take 5 mg by mouth daily.     aspirin EC 325 MG tablet  Take 1 tablet (325 mg total) by mouth daily.     atenolol 25 MG tablet  Commonly known as:  TENORMIN  Take 1 tablet (25 mg total) by mouth daily.     cholecalciferol 1000 units tablet  Commonly known as:  VITAMIN D  Take 1,000 Units by mouth daily.     folic acid 1 MG tablet  Commonly known as:  FOLVITE  Take 1 tablet (1 mg total) by mouth daily.     lactose free nutrition Liqd  Take 237 mLs by mouth 2 (two) times daily between meals.     methocarbamol 500 MG tablet  Commonly known as:  ROBAXIN  Take 1 tablet (500 mg total) by mouth every 6 (six) hours as needed for muscle spasms.     oxyCODONE 5 MG immediate release tablet  Commonly known as:  Oxy IR/ROXICODONE  Take 1-2 tablets (5-10 mg total) by mouth every 4 (four) hours as needed for moderate pain.     oxyCODONE-acetaminophen 10-325 MG tablet  Commonly known as:  PERCOCET  Take 1 tablet by mouth every 6 (six) hours as needed for pain.     simvastatin 20 MG tablet  Commonly known as:  ZOCOR  Take 1 tablet (20 mg total) by mouth daily at 6 PM.     thiamine 100 MG tablet  Take 1 tablet (100 mg total) by mouth daily.           Follow-up Information    Follow up with [facility], MD In 1 week.   Specialty:  General Surgery   Contact information:   [address] [place] [address] [phone]       Follow up with [facility].   Why:  You will receive a phone call to schedule your home health physical therapy, nurse, occupational therapy and aide   Contact information:   [phone]      Signed: Rolm Bookbinder 01/22/2016, 3:57 PM

## 2016-01-22 NOTE — Progress Notes (Signed)
2 Days Post-Op  Subjective: Some pain, was up some yesterday, no real issues  Objective: Vital signs in last 24 hours: Temp:  [98.2 F (36.8 C)-99.2 F (37.3 C)] 99.2 F (37.3 C) (06/10 0622) Pulse Rate:  [62-65] 65 (06/10 0622) Resp:  [18-19] 19 (06/10 0622) BP: (122-171)/(50-88) 142/62 mmHg (06/10 0622) SpO2:  [94 %-96 %] 96 % (06/10 0622)    Intake/Output from previous day: 06/09 0701 - 06/10 0700 In: 720 [P.O.:720] Out: 365 [Urine:175; Drains:190] Intake/Output this shift:    Incision/Wound:bilateral mastectomy incisions clean without infection, no drainage, drains as expected  Anti-infectives: Anti-infectives    Start     Dose/Rate Route Frequency Ordered Stop   01/20/16 1015  ceFAZolin (ANCEF) IVPB 2g/100 mL premix     2 g 200 mL/hr over 30 Minutes Intravenous To Surgery 01/20/16 1011 01/20/16 1230      Assessment/Plan: POD 2 bilateral tm, right ax sn biopsy  Can dc home today if everything setup  [facility] 01/22/2016

## 2016-01-24 NOTE — Care Management (Signed)
Received call from pt's family member, Vaughan Basta, that pt has not heard from [facility] agency yet. Reassured family member that [facility] agency will be following up with pt. Called [facility] and spoke with [place]; she states they have not received any orders or clinical information on patient. Faxed demographic info, orders and clinical info to [facility] at [phone]. She states they will make contact with pt today, once they have admitting information.   Reinaldo Raddle, RN, BSN  Trauma/Neuro ICU Case Manager [phone]

## 2016-01-25 ENCOUNTER — Telehealth: Payer: Self-pay | Admitting: *Deleted

## 2016-01-25 DIAGNOSIS — Z79891 Long term (current) use of opiate analgesic: Secondary | ICD-10-CM | POA: Diagnosis not present

## 2016-01-25 DIAGNOSIS — I10 Essential (primary) hypertension: Secondary | ICD-10-CM | POA: Diagnosis not present

## 2016-01-25 DIAGNOSIS — Z9012 Acquired absence of left breast and nipple: Secondary | ICD-10-CM | POA: Diagnosis not present

## 2016-01-25 DIAGNOSIS — Z483 Aftercare following surgery for neoplasm: Secondary | ICD-10-CM | POA: Diagnosis not present

## 2016-01-25 DIAGNOSIS — C50411 Malignant neoplasm of upper-outer quadrant of right female breast: Secondary | ICD-10-CM | POA: Diagnosis not present

## 2016-01-25 DIAGNOSIS — Z9011 Acquired absence of right breast and nipple: Secondary | ICD-10-CM | POA: Diagnosis not present

## 2016-01-25 NOTE — Telephone Encounter (Signed)
Ordered Oncotype Dx test per MD.  Faxed order to [facility] and called Keisha to verify she received it.  Faxed Order to [facility].  Placed a note to f/u for results.

## 2016-01-26 DIAGNOSIS — Z79891 Long term (current) use of opiate analgesic: Secondary | ICD-10-CM | POA: Diagnosis not present

## 2016-01-26 DIAGNOSIS — I10 Essential (primary) hypertension: Secondary | ICD-10-CM | POA: Diagnosis not present

## 2016-01-26 DIAGNOSIS — Z9011 Acquired absence of right breast and nipple: Secondary | ICD-10-CM | POA: Diagnosis not present

## 2016-01-26 DIAGNOSIS — C50411 Malignant neoplasm of upper-outer quadrant of right female breast: Secondary | ICD-10-CM | POA: Diagnosis not present

## 2016-01-26 DIAGNOSIS — Z483 Aftercare following surgery for neoplasm: Secondary | ICD-10-CM | POA: Diagnosis not present

## 2016-01-26 DIAGNOSIS — Z9012 Acquired absence of left breast and nipple: Secondary | ICD-10-CM | POA: Diagnosis not present

## 2016-01-31 DIAGNOSIS — I10 Essential (primary) hypertension: Secondary | ICD-10-CM | POA: Diagnosis not present

## 2016-01-31 DIAGNOSIS — Z79891 Long term (current) use of opiate analgesic: Secondary | ICD-10-CM | POA: Diagnosis not present

## 2016-01-31 DIAGNOSIS — C50411 Malignant neoplasm of upper-outer quadrant of right female breast: Secondary | ICD-10-CM | POA: Diagnosis not present

## 2016-01-31 DIAGNOSIS — Z9011 Acquired absence of right breast and nipple: Secondary | ICD-10-CM | POA: Diagnosis not present

## 2016-01-31 DIAGNOSIS — Z483 Aftercare following surgery for neoplasm: Secondary | ICD-10-CM | POA: Diagnosis not present

## 2016-01-31 DIAGNOSIS — Z9012 Acquired absence of left breast and nipple: Secondary | ICD-10-CM | POA: Diagnosis not present

## 2016-02-01 DIAGNOSIS — Z9012 Acquired absence of left breast and nipple: Secondary | ICD-10-CM | POA: Diagnosis not present

## 2016-02-01 DIAGNOSIS — C50411 Malignant neoplasm of upper-outer quadrant of right female breast: Secondary | ICD-10-CM | POA: Diagnosis not present

## 2016-02-01 DIAGNOSIS — I10 Essential (primary) hypertension: Secondary | ICD-10-CM | POA: Diagnosis not present

## 2016-02-01 DIAGNOSIS — Z483 Aftercare following surgery for neoplasm: Secondary | ICD-10-CM | POA: Diagnosis not present

## 2016-02-01 DIAGNOSIS — Z9011 Acquired absence of right breast and nipple: Secondary | ICD-10-CM | POA: Diagnosis not present

## 2016-02-01 DIAGNOSIS — Z79891 Long term (current) use of opiate analgesic: Secondary | ICD-10-CM | POA: Diagnosis not present

## 2016-02-02 DIAGNOSIS — C50411 Malignant neoplasm of upper-outer quadrant of right female breast: Secondary | ICD-10-CM | POA: Diagnosis not present

## 2016-02-03 ENCOUNTER — Encounter (HOSPITAL_COMMUNITY): Payer: Self-pay

## 2016-02-03 DIAGNOSIS — Z9012 Acquired absence of left breast and nipple: Secondary | ICD-10-CM | POA: Diagnosis not present

## 2016-02-03 DIAGNOSIS — Z483 Aftercare following surgery for neoplasm: Secondary | ICD-10-CM | POA: Diagnosis not present

## 2016-02-03 DIAGNOSIS — I10 Essential (primary) hypertension: Secondary | ICD-10-CM | POA: Diagnosis not present

## 2016-02-03 DIAGNOSIS — C50411 Malignant neoplasm of upper-outer quadrant of right female breast: Secondary | ICD-10-CM | POA: Diagnosis not present

## 2016-02-03 DIAGNOSIS — Z79891 Long term (current) use of opiate analgesic: Secondary | ICD-10-CM | POA: Diagnosis not present

## 2016-02-03 DIAGNOSIS — Z9011 Acquired absence of right breast and nipple: Secondary | ICD-10-CM | POA: Diagnosis not present

## 2016-02-03 IMAGING — CR DG CHEST 2V
1 series · 1 of 1 positions shown · non-contrast
Comparison: 08/29/2013

CLINICAL DATA: Status post fall, lost consciousness for 1 min

EXAM:
CHEST  2 VIEW

[view not recorded]
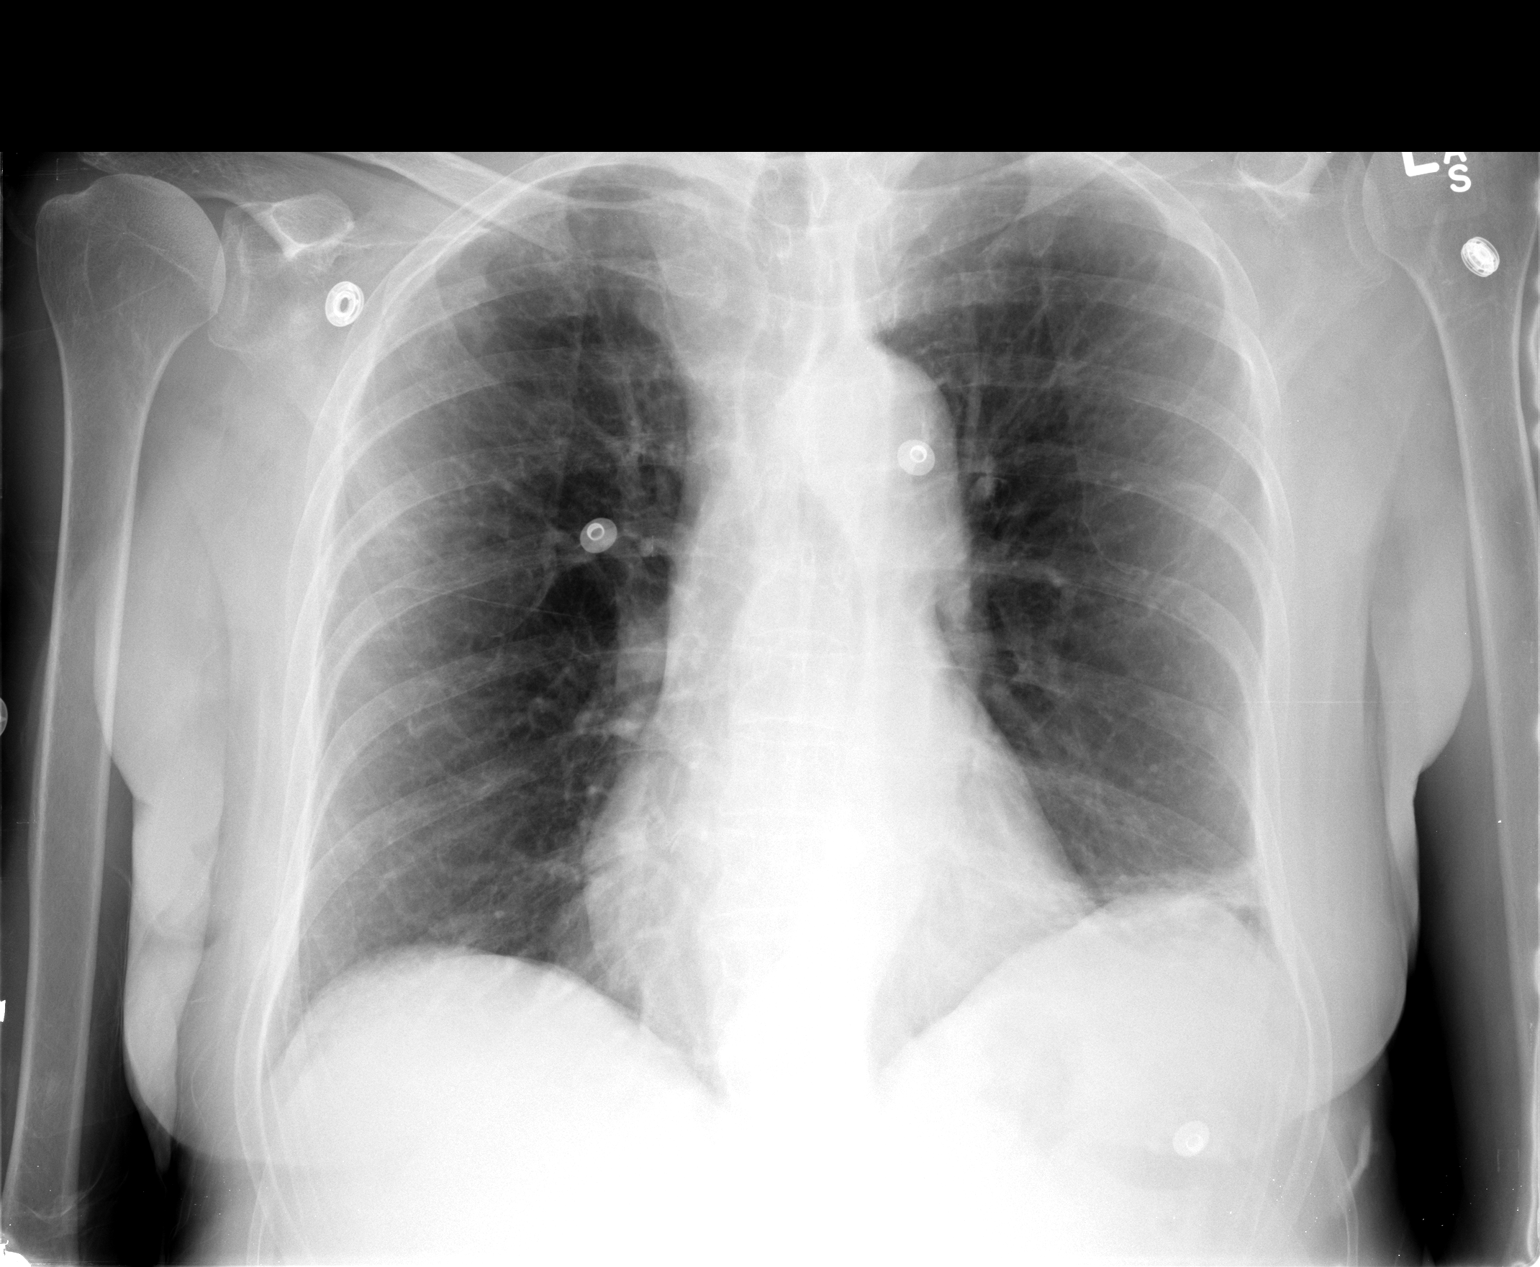

[1 of 1 positions shown; findings below may reference images not displayed]

FINDINGS: There is left basilar airspace disease likely reflecting scarring.
There is no focal parenchymal opacity, pleural effusion, or
pneumothorax. The heart and mediastinal contours are unremarkable.

The osseous structures are unremarkable.
IMPRESSION: No active cardiopulmonary disease.

## 2016-02-04 DIAGNOSIS — I10 Essential (primary) hypertension: Secondary | ICD-10-CM | POA: Diagnosis not present

## 2016-02-04 DIAGNOSIS — Z483 Aftercare following surgery for neoplasm: Secondary | ICD-10-CM | POA: Diagnosis not present

## 2016-02-04 DIAGNOSIS — Z79891 Long term (current) use of opiate analgesic: Secondary | ICD-10-CM | POA: Diagnosis not present

## 2016-02-04 DIAGNOSIS — Z9012 Acquired absence of left breast and nipple: Secondary | ICD-10-CM | POA: Diagnosis not present

## 2016-02-04 DIAGNOSIS — Z9011 Acquired absence of right breast and nipple: Secondary | ICD-10-CM | POA: Diagnosis not present

## 2016-02-04 DIAGNOSIS — C50411 Malignant neoplasm of upper-outer quadrant of right female breast: Secondary | ICD-10-CM | POA: Diagnosis not present

## 2016-02-07 ENCOUNTER — Telehealth: Payer: Self-pay | Admitting: *Deleted

## 2016-02-07 DIAGNOSIS — Z483 Aftercare following surgery for neoplasm: Secondary | ICD-10-CM | POA: Diagnosis not present

## 2016-02-07 DIAGNOSIS — Z79891 Long term (current) use of opiate analgesic: Secondary | ICD-10-CM | POA: Diagnosis not present

## 2016-02-07 DIAGNOSIS — I10 Essential (primary) hypertension: Secondary | ICD-10-CM | POA: Diagnosis not present

## 2016-02-07 DIAGNOSIS — C50411 Malignant neoplasm of upper-outer quadrant of right female breast: Secondary | ICD-10-CM | POA: Diagnosis not present

## 2016-02-07 DIAGNOSIS — Z9012 Acquired absence of left breast and nipple: Secondary | ICD-10-CM | POA: Diagnosis not present

## 2016-02-07 DIAGNOSIS — Z9011 Acquired absence of right breast and nipple: Secondary | ICD-10-CM | POA: Diagnosis not present

## 2016-02-07 NOTE — Telephone Encounter (Signed)
Received oncotype results of 3.  Left message for a return phone call.  Copy to medical records to scan and copy to Dr. Jana Hakim.

## 2016-02-08 DIAGNOSIS — C50411 Malignant neoplasm of upper-outer quadrant of right female breast: Secondary | ICD-10-CM | POA: Diagnosis not present

## 2016-02-08 DIAGNOSIS — Z79891 Long term (current) use of opiate analgesic: Secondary | ICD-10-CM | POA: Diagnosis not present

## 2016-02-08 DIAGNOSIS — Z9011 Acquired absence of right breast and nipple: Secondary | ICD-10-CM | POA: Diagnosis not present

## 2016-02-08 DIAGNOSIS — Z9012 Acquired absence of left breast and nipple: Secondary | ICD-10-CM | POA: Diagnosis not present

## 2016-02-08 DIAGNOSIS — I10 Essential (primary) hypertension: Secondary | ICD-10-CM | POA: Diagnosis not present

## 2016-02-08 DIAGNOSIS — Z483 Aftercare following surgery for neoplasm: Secondary | ICD-10-CM | POA: Diagnosis not present

## 2016-02-09 DIAGNOSIS — C50411 Malignant neoplasm of upper-outer quadrant of right female breast: Secondary | ICD-10-CM | POA: Diagnosis not present

## 2016-02-09 DIAGNOSIS — Z9012 Acquired absence of left breast and nipple: Secondary | ICD-10-CM | POA: Diagnosis not present

## 2016-02-09 DIAGNOSIS — Z9011 Acquired absence of right breast and nipple: Secondary | ICD-10-CM | POA: Diagnosis not present

## 2016-02-09 DIAGNOSIS — I10 Essential (primary) hypertension: Secondary | ICD-10-CM | POA: Diagnosis not present

## 2016-02-09 DIAGNOSIS — Z483 Aftercare following surgery for neoplasm: Secondary | ICD-10-CM | POA: Diagnosis not present

## 2016-02-09 DIAGNOSIS — Z79891 Long term (current) use of opiate analgesic: Secondary | ICD-10-CM | POA: Diagnosis not present

## 2016-02-11 DIAGNOSIS — Z9012 Acquired absence of left breast and nipple: Secondary | ICD-10-CM | POA: Diagnosis not present

## 2016-02-11 DIAGNOSIS — C50411 Malignant neoplasm of upper-outer quadrant of right female breast: Secondary | ICD-10-CM | POA: Diagnosis not present

## 2016-02-11 DIAGNOSIS — Z79891 Long term (current) use of opiate analgesic: Secondary | ICD-10-CM | POA: Diagnosis not present

## 2016-02-11 DIAGNOSIS — Z483 Aftercare following surgery for neoplasm: Secondary | ICD-10-CM | POA: Diagnosis not present

## 2016-02-11 DIAGNOSIS — I10 Essential (primary) hypertension: Secondary | ICD-10-CM | POA: Diagnosis not present

## 2016-02-11 DIAGNOSIS — Z9011 Acquired absence of right breast and nipple: Secondary | ICD-10-CM | POA: Diagnosis not present

## 2016-02-16 DIAGNOSIS — I10 Essential (primary) hypertension: Secondary | ICD-10-CM | POA: Diagnosis not present

## 2016-02-16 DIAGNOSIS — C50411 Malignant neoplasm of upper-outer quadrant of right female breast: Secondary | ICD-10-CM | POA: Diagnosis not present

## 2016-02-16 DIAGNOSIS — Z9011 Acquired absence of right breast and nipple: Secondary | ICD-10-CM | POA: Diagnosis not present

## 2016-02-16 DIAGNOSIS — Z9012 Acquired absence of left breast and nipple: Secondary | ICD-10-CM | POA: Diagnosis not present

## 2016-02-16 DIAGNOSIS — Z79891 Long term (current) use of opiate analgesic: Secondary | ICD-10-CM | POA: Diagnosis not present

## 2016-02-16 DIAGNOSIS — Z483 Aftercare following surgery for neoplasm: Secondary | ICD-10-CM | POA: Diagnosis not present

## 2016-02-17 DIAGNOSIS — Z483 Aftercare following surgery for neoplasm: Secondary | ICD-10-CM | POA: Diagnosis not present

## 2016-02-17 DIAGNOSIS — C50411 Malignant neoplasm of upper-outer quadrant of right female breast: Secondary | ICD-10-CM | POA: Diagnosis not present

## 2016-02-17 DIAGNOSIS — I10 Essential (primary) hypertension: Secondary | ICD-10-CM | POA: Diagnosis not present

## 2016-02-17 DIAGNOSIS — Z9011 Acquired absence of right breast and nipple: Secondary | ICD-10-CM | POA: Diagnosis not present

## 2016-02-17 DIAGNOSIS — Z9012 Acquired absence of left breast and nipple: Secondary | ICD-10-CM | POA: Diagnosis not present

## 2016-02-17 DIAGNOSIS — Z79891 Long term (current) use of opiate analgesic: Secondary | ICD-10-CM | POA: Diagnosis not present

## 2016-02-18 DIAGNOSIS — Z9012 Acquired absence of left breast and nipple: Secondary | ICD-10-CM | POA: Diagnosis not present

## 2016-02-18 DIAGNOSIS — Z9011 Acquired absence of right breast and nipple: Secondary | ICD-10-CM | POA: Diagnosis not present

## 2016-02-18 DIAGNOSIS — Z79891 Long term (current) use of opiate analgesic: Secondary | ICD-10-CM | POA: Diagnosis not present

## 2016-02-18 DIAGNOSIS — Z483 Aftercare following surgery for neoplasm: Secondary | ICD-10-CM | POA: Diagnosis not present

## 2016-02-18 DIAGNOSIS — C50411 Malignant neoplasm of upper-outer quadrant of right female breast: Secondary | ICD-10-CM | POA: Diagnosis not present

## 2016-02-18 DIAGNOSIS — I10 Essential (primary) hypertension: Secondary | ICD-10-CM | POA: Diagnosis not present

## 2016-02-21 DIAGNOSIS — I10 Essential (primary) hypertension: Secondary | ICD-10-CM | POA: Diagnosis not present

## 2016-02-21 DIAGNOSIS — Z9011 Acquired absence of right breast and nipple: Secondary | ICD-10-CM | POA: Diagnosis not present

## 2016-02-21 DIAGNOSIS — Z79891 Long term (current) use of opiate analgesic: Secondary | ICD-10-CM | POA: Diagnosis not present

## 2016-02-21 DIAGNOSIS — Z483 Aftercare following surgery for neoplasm: Secondary | ICD-10-CM | POA: Diagnosis not present

## 2016-02-21 DIAGNOSIS — Z9012 Acquired absence of left breast and nipple: Secondary | ICD-10-CM | POA: Diagnosis not present

## 2016-02-21 DIAGNOSIS — C50411 Malignant neoplasm of upper-outer quadrant of right female breast: Secondary | ICD-10-CM | POA: Diagnosis not present

## 2016-02-24 DIAGNOSIS — Z483 Aftercare following surgery for neoplasm: Secondary | ICD-10-CM | POA: Diagnosis not present

## 2016-02-24 DIAGNOSIS — Z9012 Acquired absence of left breast and nipple: Secondary | ICD-10-CM | POA: Diagnosis not present

## 2016-02-24 DIAGNOSIS — Z79891 Long term (current) use of opiate analgesic: Secondary | ICD-10-CM | POA: Diagnosis not present

## 2016-02-24 DIAGNOSIS — Z9011 Acquired absence of right breast and nipple: Secondary | ICD-10-CM | POA: Diagnosis not present

## 2016-02-24 DIAGNOSIS — I10 Essential (primary) hypertension: Secondary | ICD-10-CM | POA: Diagnosis not present

## 2016-02-24 DIAGNOSIS — C50411 Malignant neoplasm of upper-outer quadrant of right female breast: Secondary | ICD-10-CM | POA: Diagnosis not present

## 2016-02-25 ENCOUNTER — Encounter: Payer: Medicare Other | Admitting: Oncology

## 2016-02-25 ENCOUNTER — Encounter: Payer: Self-pay | Admitting: Oncology

## 2016-02-25 DIAGNOSIS — Z9011 Acquired absence of right breast and nipple: Secondary | ICD-10-CM | POA: Diagnosis not present

## 2016-02-25 DIAGNOSIS — Z483 Aftercare following surgery for neoplasm: Secondary | ICD-10-CM | POA: Diagnosis not present

## 2016-02-25 DIAGNOSIS — C50411 Malignant neoplasm of upper-outer quadrant of right female breast: Secondary | ICD-10-CM | POA: Diagnosis not present

## 2016-02-25 DIAGNOSIS — Z79891 Long term (current) use of opiate analgesic: Secondary | ICD-10-CM | POA: Diagnosis not present

## 2016-02-25 DIAGNOSIS — Z9012 Acquired absence of left breast and nipple: Secondary | ICD-10-CM | POA: Diagnosis not present

## 2016-02-25 DIAGNOSIS — I10 Essential (primary) hypertension: Secondary | ICD-10-CM | POA: Diagnosis not present

## 2016-02-25 NOTE — Progress Notes (Signed)
No show

## 2016-02-28 DIAGNOSIS — Z9011 Acquired absence of right breast and nipple: Secondary | ICD-10-CM | POA: Diagnosis not present

## 2016-02-28 DIAGNOSIS — Z483 Aftercare following surgery for neoplasm: Secondary | ICD-10-CM | POA: Diagnosis not present

## 2016-02-28 DIAGNOSIS — Z79891 Long term (current) use of opiate analgesic: Secondary | ICD-10-CM | POA: Diagnosis not present

## 2016-02-28 DIAGNOSIS — Z9012 Acquired absence of left breast and nipple: Secondary | ICD-10-CM | POA: Diagnosis not present

## 2016-02-28 DIAGNOSIS — I10 Essential (primary) hypertension: Secondary | ICD-10-CM | POA: Diagnosis not present

## 2016-02-28 DIAGNOSIS — C50411 Malignant neoplasm of upper-outer quadrant of right female breast: Secondary | ICD-10-CM | POA: Diagnosis not present

## 2016-02-29 ENCOUNTER — Other Ambulatory Visit: Payer: Self-pay | Admitting: *Deleted

## 2016-02-29 ENCOUNTER — Telehealth: Payer: Self-pay | Admitting: Oncology

## 2016-02-29 NOTE — Telephone Encounter (Signed)
lvm to inform pt of 7/20 appt per 7/18 pof

## 2016-03-02 ENCOUNTER — Encounter: Payer: Medicare Other | Admitting: Oncology

## 2016-03-02 ENCOUNTER — Encounter: Payer: Self-pay | Admitting: Oncology

## 2016-03-02 NOTE — Progress Notes (Signed)
**Note Melanie-Identified via Obfuscation** [name]  [phone] [phone]     ID: [name] DOB: 76-30-1942  MR#: [id number]  TKW#:[id number]  Patient Care Team: Josetta Huddle, MD as PCP - General (Internal Medicine) Chauncey Cruel, MD as Consulting Physician (Oncology) Rolm Bookbinder, MD as Consulting Physician (General Surgery) Thea Silversmith, MD as Consulting Physician (Radiation Oncology) PCP: Henrine Screws, MD OTHER MD:  CHIEF COMPLAINT: Estrogen receptor positive invasive breast cancer  CURRENT TREATMENT: to start antiestrogens   BREAST CANCER HISTORY: From the original intake note:  The patient had noticed a change in her right breast since approximately March 2017. This came to to medical attention during a recent exam and on 12/31/2015 the patient underwent bilateral diagnostic mammography with tomography and bilateral ultrasound at the [facility]. This found the breast density to be category C. There were pleomorphic calcifications over 3 separate quadrants of the right breast as well as a large area of irregular density in the upper outer quadrant. There were also heterogeneous calcifications throughout the left breast. There was an obscured mass in the subareolar left breast area upper outer quadrant as well.   Ultrasonography on the right confirmed a large irregular hypoechoic mass in the upper outer quadrant measuring 4.9 cm. There was a second lower outer quadrant mass measuring 1.1 cm. There was no right axillary lymphadenopathy. On the left side there was a 0.7 cm cyst at the 2:00 position. There was no left axillary lymphadenopathy.  On 01/05/2016 the patient underwent right breast upper outer quadrant biopsy, confirming an invasive ductal carcinoma, associated with significant extracellular mucin, grade 2 estrogen and progesterone receptor strongly positive, both at 95%, with an MIB-1 of 20%, and no HER-2 amplification, the signals ratio being 1.24 and  the number per cell 2.05. On the left side biopsy showed a complex sclerosing lesion.  Her subsequent history is as detailed below.  INTERVAL HISTORY: [name] returns today for further follow-up of her right-sided breast cancer. On 01/20/2016 she underwent bilateral mastectomies with right axillary sentinel lymph node sampling. On the left side there was a complex sclerosing lesion but no malignancy. 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 DCIS was focally within 0.1 cm of the deep margin. All 3 sentinel lymph nodes were clear. Repeat HER-2 was again negative with a signals ratio 1.09 and the number per cell 2.35.  The patient is here today to discuss anti-estrogen therapy    REVIEW OF SYSTEMS: There were no specific symptoms leading to the original mammogram, which was routinely scheduled. The patient denies unusual headaches, visual changes, nausea, vomiting, stiff neck, dizziness, or gait imbalance. There has been no cough, phlegm production, or pleurisy, no chest pain or pressure, and no change in bowel or bladder habits. The patient denies fever, rash, bleeding, unexplained fatigue or unexplained weight loss. She complains of hearing loss, heartburn, easy bruising, arthritis, with no area being more intensively or persistently painful unusual, and occasional headaches. A detailed review of systems was otherwise entirely negative.  PAST MEDICAL HISTORY: Past Medical History  Diagnosis Date  . Hypertension   . Gout   . Closed left hip fracture ([place]) 5/14  . Alcohol abuse   . GERD (gastroesophageal reflux disease)   . Vitamin D deficiency   . Vitamin B12 deficiency   . Depression   . Cerebrovascular accident (stroke) ([place]) 2/07    left frontal lobe  .  Dupuytren's contracture   . Bacterial vaginosis   . Hyperlipidemia LDL goal <70   . Fibrocystic breast disease   . Paroxysmal a-fib ([place])   .  Renal calculus   . Fracture of metatarsal bone of right foot     5th metatarsal  . Headache   . Arthritis   . Cancer [facility])     Breast cancer  . Anemia   . Complication of anesthesia     woke up too early one time    PAST SURGICAL HISTORY: Past Surgical History  Procedure Laterality Date  . Leg surgery    . Wrist surgery      left wrist  . Hip arthroplasty Left 12/17/2012    Procedure: LEFT HIP HEMI ARTHROPLASTY ;  Surgeon: Rozanna Box, MD;  Location: [facility];  Service: Orthopedics;  Laterality: Left;  . Cataracts    . Lithotripsy    . Abdominal hysterectomy    . Mastectomy w/ sentinel node biopsy Bilateral 01/20/2016    Procedure: BILATERAL TOTAL MASTECTOMY WITH RIGHT SENTINEL LYMPH NODE BIOPSY;  Surgeon: Rolm Bookbinder, MD;  Location: [facility];  Service: General;  Laterality: Bilateral;    FAMILY HISTORY Family History  Problem Relation Age of Onset  . Stroke Mother   The patient's father died from a stroke at age 76. The patient's mother died from a stroke at age 7. [name] had 2 brothers, 6 sisters. The only cancer in her family she is aware of is her own daughter, who was diagnosed with kidney cancer in her 71s  GYNECOLOGIC HISTORY:  No LMP recorded. Patient is postmenopausal. Menarche age 9, first live birth age 38, the patient is [place] P5. She stopped having periods "a long time ago". She never took hormone replacement. She did take oral contraception approximately 4 years in the late 1970s.  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 She is widowed. At home currently her son [name] is staying with her but he usually moves between [place] and [place]. He works in Architect. A second son [name] also lives in [place] and works as a Development worker, community. Daughter [name] lives in [place] and works as a Quarry manager. Son [name] lives in [place] where he works as a Dealer. [name] lives in [place] and works in yard  maintenance. The patient has 9 grandchildren. The patient is a [place]    ADVANCED DIRECTIVES: In place; the patient has named her daughter [name] as her healthcare power of attorney. She was not able to give [place] her daughter's phone number at the visit 01/12/2016 a bit to the right   HEALTH MAINTENANCE: Social History  Substance Use Topics  . Smoking status: Never Smoker   . Smokeless tobacco: Current User    Types: Chew  . Alcohol Use: Yes     Comment: daily -drinks a quart of beer     Colonoscopy: 1990s?  PAP: Does not recall  Bone density: Never  Lipid panel:  Allergies  Allergen Reactions  . Ambien [Zolpidem Tartrate] Other (See Comments)    Causes nightmares    Current Outpatient Prescriptions  Medication Sig Dispense Refill  . amLODipine (NORVASC) 5 MG tablet Take 5 mg by mouth daily.    Marland Kitchen aspirin EC 325 MG tablet Take 1 tablet (325 mg total) by mouth daily. 30 tablet 0  . atenolol (TENORMIN) 25 MG tablet Take 1 tablet (25 mg total) by mouth daily. 30 tablet 0  . cholecalciferol (  VITAMIN D) 1000 UNITS tablet Take 1,000 Units by mouth daily.    . folic acid (FOLVITE) 1 MG tablet Take 1 tablet (1 mg total) by mouth daily. 30 tablet 0  . ibuprofen (ADVIL) 200 MG tablet Take 400 mg by mouth every 6 (six) hours as needed for fever, headache or mild pain. Reported on 01/12/2016    . lactose free nutrition (BOOST PLUS) LIQD Take 237 mLs by mouth 2 (two) times daily between meals. (Patient taking differently: Take 237 mLs by mouth daily. )    . methocarbamol (ROBAXIN) 500 MG tablet Take 1 tablet (500 mg total) by mouth every 6 (six) hours as needed for muscle spasms. 20 tablet 0  . oxyCODONE (OXY IR/ROXICODONE) 5 MG immediate release tablet Take 1-2 tablets (5-10 mg total) by mouth every 4 (four) hours as needed for moderate pain. 30 tablet 0  . oxyCODONE-acetaminophen (PERCOCET) 10-325 MG tablet Take 1 tablet by mouth every 6 (six) hours as needed for pain. 20 tablet 0  .  simvastatin (ZOCOR) 20 MG tablet Take 1 tablet (20 mg total) by mouth daily at 6 PM. (Patient not taking: Reported on 01/12/2016) 30 tablet 0  . thiamine 100 MG tablet Take 1 tablet (100 mg total) by mouth daily. (Patient not taking: Reported on 01/12/2016) 30 tablet 0   No current facility-administered medications for this visit.    OBJECTIVE: 75 year old woman who appears older than stated age There were no vitals filed for this visit.   There is no weight on file to calculate BMI.    ECOG FS:1 - Symptomatic but completely ambulatory  Ocular: Sclerae unicteric, EOMs intact Ear-nose-throat: Oropharynx clear and moist Lymphatic: No cervical or supraclavicular adenopathy Lungs no rales or rhonchi, fair excursion bilaterally Heart regular rate and rhythm, no murmur appreciated Abd soft, nontender, positive bowel sounds MSK no focal spinal tenderness, no joint edema Neuro: non-focal, well-oriented, appropriate affect Breasts: The right breast is status post recent biopsy. There is a moderate ecchymosis. There are no skin or nipple changes of concern. The right axilla is benign. The left breast is unremarkable.  LAB RESULTS:  CMP     Component Value Date/Time   NA 138 01/12/2016 1205   NA 136 09/11/2014 2157   K 4.4 01/12/2016 1205   K 3.7 09/11/2014 2157   CL 100 09/11/2014 2157   CO2 27 01/12/2016 1205   CO2 24 09/11/2014 2157   GLUCOSE 137 01/12/2016 1205   GLUCOSE 132* 09/11/2014 2157   BUN 7.4 01/12/2016 1205   BUN 7 09/11/2014 2157   CREATININE 0.9 01/12/2016 1205   CREATININE 0.84 09/11/2014 2157   CALCIUM 9.4 01/12/2016 1205   CALCIUM 8.4 09/11/2014 2157   CALCIUM 8.1* 12/18/2012 1419   PROT 7.4 01/12/2016 1205   PROT 6.6 09/11/2014 2157   ALBUMIN 3.6 01/12/2016 1205   ALBUMIN 3.6 09/11/2014 2157   AST 19 01/12/2016 1205   AST 16 09/11/2014 2157   ALT 12 01/12/2016 1205   ALT 11 09/11/2014 2157   ALKPHOS 108 01/12/2016 1205   ALKPHOS 73 09/11/2014 2157   BILITOT  0.81 01/12/2016 1205   BILITOT 0.3 09/11/2014 2157   GFRNONAA 67* 09/11/2014 2157   GFRAA 77* 09/11/2014 2157    INo results found for: SPEP, UPEP  Lab Results  Component Value Date   WBC 10.7* 01/12/2016   NEUTROABS 8.3* 01/12/2016   HGB 16.8* 01/12/2016   HCT 51.0* 01/12/2016   MCV 97.2 01/12/2016   PLT 285 01/12/2016  Chemistry      Component Value Date/Time   NA 138 01/12/2016 1205   NA 136 09/11/2014 2157   K 4.4 01/12/2016 1205   K 3.7 09/11/2014 2157   CL 100 09/11/2014 2157   CO2 27 01/12/2016 1205   CO2 24 09/11/2014 2157   BUN 7.4 01/12/2016 1205   BUN 7 09/11/2014 2157   CREATININE 0.9 01/12/2016 1205   CREATININE 0.84 09/11/2014 2157      Component Value Date/Time   CALCIUM 9.4 01/12/2016 1205   CALCIUM 8.4 09/11/2014 2157   CALCIUM 8.1* 12/18/2012 1419   ALKPHOS 108 01/12/2016 1205   ALKPHOS 73 09/11/2014 2157   AST 19 01/12/2016 1205   AST 16 09/11/2014 2157   ALT 12 01/12/2016 1205   ALT 11 09/11/2014 2157   BILITOT 0.81 01/12/2016 1205   BILITOT 0.3 09/11/2014 2157       No results found for: LABCA2  No components found for: MPNTI144  No results for input(s): INR in the last 168 hours.  Urinalysis    Component Value Date/Time   COLORURINE YELLOW 09/11/2014 2309   APPEARANCEUR CLEAR 09/11/2014 2309   LABSPEC 1.007 09/11/2014 2309   PHURINE 5.0 09/11/2014 2309   GLUCOSEU NEGATIVE 09/11/2014 2309   HGBUR NEGATIVE 09/11/2014 2309   BILIRUBINUR NEGATIVE 09/11/2014 2309   KETONESUR 15* 09/11/2014 2309   PROTEINUR NEGATIVE 09/11/2014 2309   UROBILINOGEN 0.2 09/11/2014 2309   NITRITE NEGATIVE 09/11/2014 2309   LEUKOCYTESUR NEGATIVE 09/11/2014 2309      ELIGIBLE FOR AVAILABLE RESEARCH PROTOCOL: consider PALLAS trial  STUDIES: No results found.  ASSESSMENT: 75 y.o. [place] woman status post right breast upper outer quadrant biopsy 01/05/2016 for a clinical T2 N0, stage IIA  invasive ductal carcinoma, grade 2, estrogen and  progesterone receptor positive, HER-2 nonamplified, with an MIB-1 of 20%   (a) biopsy of suspicious calcifications in the left breast on the same day showed a complex sclerosing lesion   (1) bilateral mastectomies with right-sided sentinel lymph node sampling Performed 01/20/2016 found  (a) a complex sclerosing lesion but no malignancy on the left  (b) on the right, a pT2 pN0, stage IIA invasive ductal carcinoma, grade 2, with repeat HER-2 again negative  (2) Oncotype score of 3 dates an outside the breast risk of recurrence within 10 years 4% if the patient's only systemic therapy is tamoxifen for 5 years. It also predicts no benefit from chemotherapy  (3) adjuvant anti-estrogens to follow  (4) consider PALLAS trial  PLAN: We spent the better part of today's hour-long appointment discussing the biology of breast cancer in general, and the specifics of the patient's tumor in particular. As far as local treatment is concerned him a we discussed the fact that there is no survival advantage to mastectomy as compared with lumpectomy plus radiation. In the BN's case however we do not see any possibility of keeping the right breast, which has multicentric disease. Similarly, in the left breast, while all we have documented is a complex sclerosing lesion, similar calcifications are scattered throughout the left breast and we would either have to do quite a number of biopsies or more likely attempt a lumpectomy and end up with positive margins requiring further surgery.  There was able to understand this discussion. She much prefers the idea of bilateral mastectomies 2 additional biopsies. She will have a sentinel lymph node biopsy only on the right side. She may be able to forego radiation assuming margins are clear and lymph nodes  are negative as they appear  As far as systemic therapy is concerned, she is not a candidate for anti-HER-2 immunotherapy, but is a very good candidate for anti-estrogens. The  benefit of chemotherapy in her case is likely to be marginal. When one adds of the fact that she has an intermediate grade tumor and an intermediate grade and IV-1, and factors in her age and the fact that this is a mucin producing tumor, I think the likelihood of getting a significant benefit from chemotherapy is low enough that we will not send an Oncotype.  Accordingly the plan will be for definitive surgery and then I will see the patient to discuss the pathology results. I expect she will be able to start anti-estrogens immediately but of course if there is any question regarding need for further local treatment she will be referred back to radiation oncology  Zamariah has a good understanding of the overall plan. She agrees with it. She knows the goal of treatment in her case is cure. She will call with any problems that may develop before her next visit here.  Chauncey Cruel, MD   03/02/2016 7:26 AM Medical Oncology and Hematology [facility] [address] [place], [place] [address] Tel. [phone]    Fax. [phone] I no(O1)

## 2016-03-03 ENCOUNTER — Telehealth: Payer: Self-pay | Admitting: *Deleted

## 2016-03-03 DIAGNOSIS — Z9011 Acquired absence of right breast and nipple: Secondary | ICD-10-CM | POA: Diagnosis not present

## 2016-03-03 DIAGNOSIS — Z9012 Acquired absence of left breast and nipple: Secondary | ICD-10-CM | POA: Diagnosis not present

## 2016-03-03 DIAGNOSIS — Z79891 Long term (current) use of opiate analgesic: Secondary | ICD-10-CM | POA: Diagnosis not present

## 2016-03-03 DIAGNOSIS — I10 Essential (primary) hypertension: Secondary | ICD-10-CM | POA: Diagnosis not present

## 2016-03-03 DIAGNOSIS — C50411 Malignant neoplasm of upper-outer quadrant of right female breast: Secondary | ICD-10-CM | POA: Diagnosis not present

## 2016-03-03 DIAGNOSIS — Z483 Aftercare following surgery for neoplasm: Secondary | ICD-10-CM | POA: Diagnosis not present

## 2016-03-03 NOTE — Telephone Encounter (Signed)
Spoke with patient to find out why she has missed two appointments with Dr. Jana Hakim.  I think she is getting her appointments with Dr. Donne Hazel and Dr. Jana Hakim confused.  She will see Dr. Donne Hazel 8/1 and I confirmed new appointment with Dr. Jana Hakim for 03/24/16 at 10am.  I offered her an earlier appointment but she refused because it was too early in the morning.  Encouraged her to call with any needs or concerns.

## 2016-03-06 ENCOUNTER — Other Ambulatory Visit: Payer: Self-pay | Admitting: Oncology

## 2016-03-06 MED ORDER — ANASTROZOLE 1 MG PO TABS: 1.0000 mg | ORAL_TABLET | Freq: Every day | ORAL | 4 refills | Status: DC

## 2016-03-10 DIAGNOSIS — H6121 Impacted cerumen, right ear: Secondary | ICD-10-CM | POA: Diagnosis not present

## 2016-03-10 DIAGNOSIS — C50919 Malignant neoplasm of unspecified site of unspecified female breast: Secondary | ICD-10-CM | POA: Diagnosis not present

## 2016-03-14 ENCOUNTER — Other Ambulatory Visit: Payer: Self-pay

## 2016-03-15 ENCOUNTER — Other Ambulatory Visit: Payer: Self-pay | Admitting: Nurse Practitioner

## 2016-03-15 DIAGNOSIS — R1314 Dysphagia, pharyngoesophageal phase: Secondary | ICD-10-CM | POA: Diagnosis not present

## 2016-03-15 DIAGNOSIS — R1319 Other dysphagia: Secondary | ICD-10-CM

## 2016-03-15 DIAGNOSIS — R1310 Dysphagia, unspecified: Secondary | ICD-10-CM

## 2016-03-16 ENCOUNTER — Ambulatory Visit
Admission: RE | Admit: 2016-03-16 | Discharge: 2016-03-16 | Disposition: A | Discharge status: Home / Self Care | Payer: Medicare Other | Source: Ambulatory Visit | Attending: Nurse Practitioner | Admitting: Nurse Practitioner

## 2016-03-16 DIAGNOSIS — R1310 Dysphagia, unspecified: Secondary | ICD-10-CM

## 2016-03-16 DIAGNOSIS — R1319 Other dysphagia: Secondary | ICD-10-CM

## 2016-03-16 DIAGNOSIS — K219 Gastro-esophageal reflux disease without esophagitis: Secondary | ICD-10-CM | POA: Diagnosis not present

## 2016-03-24 ENCOUNTER — Telehealth: Payer: Self-pay | Admitting: Oncology

## 2016-03-24 ENCOUNTER — Ambulatory Visit (HOSPITAL_BASED_OUTPATIENT_CLINIC_OR_DEPARTMENT_OTHER): Payer: Medicare Other | Admitting: Oncology

## 2016-03-24 ENCOUNTER — Other Ambulatory Visit: Payer: Self-pay | Admitting: *Deleted

## 2016-03-24 VITALS — BP 162/81 | HR 76 | Temp 98.1°F | Resp 18 | Ht 70.0 in | Wt 124.0 lb

## 2016-03-24 DIAGNOSIS — C50411 Malignant neoplasm of upper-outer quadrant of right female breast: Secondary | ICD-10-CM

## 2016-03-24 DIAGNOSIS — Z79811 Long term (current) use of aromatase inhibitors: Secondary | ICD-10-CM | POA: Diagnosis not present

## 2016-03-24 DIAGNOSIS — Z170 Estrogen receptor positive status [ER+]: Secondary | ICD-10-CM

## 2016-03-24 NOTE — Telephone Encounter (Signed)
appt made and avs printed °

## 2016-03-24 NOTE — Progress Notes (Signed)
[name]  [phone] [phone]     ID: [name] DOB: 08/24/1940  MR#: [id number]  CSN#:[id number]  Patient Care Team: Melanie Gates, MD as PCP - General (Internal Medicine) Melanie C Magrinat, MD as Consulting Physician (Oncology) Melanie Wakefield, MD as Consulting Physician (General Surgery) Melanie Wentworth, MD as Consulting Physician (Radiation Oncology) PCP: Paul,Melanie NEVILL, MD OTHER MD:  CHIEF COMPLAINT: Estrogen receptor positive invasive breast cancer  CURRENT TREATMENT: Anastrozole   BREAST CANCER HISTORY: From the original intake note:  The patient had noticed a change in her right breast since approximately March 2017. This came to to medical attention during a recent exam and on 12/31/2015 the patient underwent bilateral diagnostic mammography with tomography [facility] This found the breast density to be category C. There were pleomorphic calcifications over 3 separate quadrants of the right breast as well as a large area of irregular density in the upper outer quadrant. There were also heterogeneous calcifications throughout the left breast. There was an obscured mass in the subareolar left breast area upper outer quadrant as well.   Ultrasonography on the right confirmed a large irregular hypoechoic mass in the upper outer quadrant measuring 4.9 cm. There was a second lower outer quadrant mass measuring 1.1 cm. There was no right axillary lymphadenopathy. On the left side there was a 0.7 cm cyst at the 2:00 position. There was no left axillary lymphadenopathy.  On 01/05/2016 the patient underwent right breast upper outer quadrant biopsy, confirming an invasive ductal carcinoma, associated with significant extracellular mucin, grade 2 estrogen and progesterone receptor strongly positive, both at 95%, with an MIB-1 of 20%, and no HER-2 amplification, the signals ratio being 1.24 and the number  per cell 2.05. On the left side biopsy showed a complex sclerosing lesion.  Her subsequent history is as detailed below.  INTERVAL HISTORY: [name] returns today for follow-up of her estrogen receptor positive breast cancer accompanied by one of her daughters-in-law (the wife over oldest son). The patient started anastrozole approximately 3 weeks ago. She so far has had no side effects whatsoever. She obtains it at a good price.  REVIEW OF SYSTEMS: [name] had significant pain after her surgery, but that has resolved. She feels a little weak. She has some difficulty swallowing and is sticking to soft foods. He thinks don't taste particularly well. She does her own cooking at home. She denies unusual headaches, visual changes, nausea, vomiting, cough, phlegm production, or change in bowel or bladder habits. A detailed review of systems today was otherwise noncontributory.  PAST MEDICAL HISTORY: Past Medical History:  Diagnosis Date  . Alcohol abuse   . Anemia   . Arthritis   . Bacterial vaginosis   . Cancer (HCC)    Breast cancer  . Cerebrovascular accident (stroke) (HCC) 2/07   left frontal lobe  . Closed left hip fracture (HCC) 5/14  . Complication of anesthesia    woke up too early one time  . Depression   . Dupuytren's contracture   . Fibrocystic breast disease   . Fracture of metatarsal bone of right foot    5th metatarsal  . GERD (gastroesophageal reflux disease)   . Gout   . Headache   . Hyperlipidemia LDL goal <70   . Hypertension   . Paroxysmal a-fib (HCC)   . Renal calculus   . Vitamin B12 deficiency   . Vitamin D deficiency     PAST SURGICAL HISTORY: Past Surgical   History:  Procedure Laterality Date  . ABDOMINAL HYSTERECTOMY    . cataracts    . HIP ARTHROPLASTY Left 12/17/2012   Procedure: LEFT HIP HEMI ARTHROPLASTY ;  Surgeon: Michael H Handy, MD;  Location: [facility] OR;  Service: Orthopedics;  Laterality: Left;  . LEG SURGERY    . LITHOTRIPSY    . MASTECTOMY W/  SENTINEL NODE BIOPSY Bilateral 01/20/2016   Procedure: BILATERAL TOTAL MASTECTOMY WITH RIGHT SENTINEL LYMPH NODE BIOPSY;  Surgeon: Melanie Wakefield, MD;  Location: [facility] OR;  Service: General;  Laterality: Bilateral;  . WRIST SURGERY     left wrist    FAMILY HISTORY Family History  Problem Relation Age of Onset  . Stroke Mother   The patient's father died from a stroke at age 62. The patient's mother died from a stroke at age 74. [name] had 2 brothers, 6 sisters. The only cancer in her family she is aware of is her own daughter, who was diagnosed with kidney cancer in her 50s  GYNECOLOGIC HISTORY:  No LMP recorded. Patient is postmenopausal. Menarche age 14, first live birth age 17, the patient is GX P5. She stopped having periods "a long time ago". She never took hormone replacement. She did take oral contraception approximately 4 years in the late 1970s.  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 She is widowed. At home currently her son [name] is staying with her but he usually moves between [place] and [place]. He works in yard maintenance. A second son [name] also lives in [place] and works as a plumber. Daughter [name] lives in [place] and works as a [name]. Son [name] lives in [place] where he works as a mechanic. [name] lives in [place] and works in yard maintenance. The patient has 9 grandchildren. The patient is a [employer]    ADVANCED DIRECTIVES: In place; the patient has named her daughter Melanie as her healthcare power of attorney. She was not able to give us her daughter's phone number at the visit 01/12/2016 a bit to the right   HEALTH MAINTENANCE: Social History  Substance Use Topics  . Smoking status: Never Smoker  . Smokeless tobacco: Current User    Types: Chew  . Alcohol use Yes     Comment: daily -drinks a quart of beer     Colonoscopy: 1990s?  PAP: Does not recall  Bone density: Never  Lipid  panel:  Allergies  Allergen Reactions  . Ambien [Zolpidem Tartrate] Other (See Comments)    Causes nightmares    Current Outpatient Prescriptions  Medication Sig Dispense Refill  . amLODipine (NORVASC) 5 MG tablet Take 5 mg by mouth daily.    . anastrozole (ARIMIDEX) 1 MG tablet Take 1 tablet (1 mg total) by mouth daily. 90 tablet 4  . aspirin EC 325 MG tablet Take 1 tablet (325 mg total) by mouth daily. 30 tablet 0  . atenolol (TENORMIN) 25 MG tablet Take 1 tablet (25 mg total) by mouth daily. 30 tablet 0  . cholecalciferol (VITAMIN D) 1000 UNITS tablet Take 1,000 Units by mouth daily.    . folic acid (FOLVITE) 1 MG tablet Take 1 tablet (1 mg total) by mouth daily. 30 tablet 0  . ibuprofen (ADVIL) 200 MG tablet Take 400 mg by mouth every 6 (six) hours as needed for fever, headache or mild pain. Reported on 01/12/2016    . lactose free nutrition (BOOST PLUS) LIQD Take 237 mLs by mouth 2 (two)   times daily between meals. (Patient taking differently: Take 237 mLs by mouth daily. )    . methocarbamol (ROBAXIN) 500 MG tablet Take 1 tablet (500 mg total) by mouth every 6 (six) hours as needed for muscle spasms. 20 tablet 0  . oxyCODONE (OXY IR/ROXICODONE) 5 MG immediate release tablet Take 1-2 tablets (5-10 mg total) by mouth every 4 (four) hours as needed for moderate pain. 30 tablet 0  . oxyCODONE-acetaminophen (PERCOCET) 10-325 MG tablet Take 1 tablet by mouth every 6 (six) hours as needed for pain. 20 tablet 0  . simvastatin (ZOCOR) 20 MG tablet Take 1 tablet (20 mg total) by mouth daily at 6 PM. (Patient not taking: Reported on 01/12/2016) 30 tablet 0  . thiamine 100 MG tablet Take 1 tablet (100 mg total) by mouth daily. (Patient not taking: Reported on 01/12/2016) 30 tablet 0   No current facility-administered medications for this visit.     OBJECTIVE: 74-year-old woman In no acute distress Vitals:   03/24/16 1008  BP: (!) 162/81  Pulse: 76  Resp: 18  Temp: 98.1 F (36.7 C)     Body  mass index is 17.79 kg/m.    ECOG FS:1 - Symptomatic but completely ambulatory  Sclerae unicteric, pupils round and equal Oropharynx clear and moist-- edentulous No cervical or supraclavicular adenopathy Lungs no rales or rhonchi Heart regular rate and rhythm Abd soft, nontender, positive bowel sounds MSK no focal spinal tenderness, no upper extremity lymphedema Neuro: nonfocal, well oriented, appropriate affect Breasts: Status post bilateral mastectomies. The incisions are flat, well-healed, without erythema, swelling, or dehiscence. Both axillae are benign.    LAB RESULTS:  CMP     Component Value Date/Time   NA 138 01/12/2016 1205   K 4.4 01/12/2016 1205   CL 100 09/11/2014 2157   CO2 27 01/12/2016 1205   GLUCOSE 137 01/12/2016 1205   BUN 7.4 01/12/2016 1205   CREATININE 0.9 01/12/2016 1205   CALCIUM 9.4 01/12/2016 1205   PROT 7.4 01/12/2016 1205   ALBUMIN 3.6 01/12/2016 1205   AST 19 01/12/2016 1205   ALT 12 01/12/2016 1205   ALKPHOS 108 01/12/2016 1205   BILITOT 0.81 01/12/2016 1205   GFRNONAA 67 (L) 09/11/2014 2157   GFRAA 77 (L) 09/11/2014 2157    INo results found for: SPEP, UPEP  Lab Results  Component Value Date   WBC 10.7 (H) 01/12/2016   NEUTROABS 8.3 (H) 01/12/2016   HGB 16.8 (H) 01/12/2016   HCT 51.0 (H) 01/12/2016   MCV 97.2 01/12/2016   PLT 285 01/12/2016      Chemistry      Component Value Date/Time   NA 138 01/12/2016 1205   K 4.4 01/12/2016 1205   CL 100 09/11/2014 2157   CO2 27 01/12/2016 1205   BUN 7.4 01/12/2016 1205   CREATININE 0.9 01/12/2016 1205      Component Value Date/Time   CALCIUM 9.4 01/12/2016 1205   ALKPHOS 108 01/12/2016 1205   AST 19 01/12/2016 1205   ALT 12 01/12/2016 1205   BILITOT 0.81 01/12/2016 1205       No results found for: LABCA2  No components found for: LABCA125  No results for input(s): INR in the last 168 hours.  Urinalysis    Component Value Date/Time   COLORURINE YELLOW 09/11/2014 2309    APPEARANCEUR CLEAR 09/11/2014 2309   LABSPEC 1.007 09/11/2014 2309   PHURINE 5.0 09/11/2014 2309   GLUCOSEU NEGATIVE 09/11/2014 2309   HGBUR NEGATIVE 09/11/2014 2309     BILIRUBINUR NEGATIVE 09/11/2014 2309   KETONESUR 15 (A) 09/11/2014 2309   PROTEINUR NEGATIVE 09/11/2014 2309   UROBILINOGEN 0.2 09/11/2014 2309   NITRITE NEGATIVE 09/11/2014 2309   LEUKOCYTESUR NEGATIVE 09/11/2014 2309      ELIGIBLE FOR AVAILABLE RESEARCH PROTOCOL: consider PALLAS trial  STUDIES: Dg Esophagus  Result Date: 03/16/2016 CLINICAL DATA:  Dysphagia EXAM: ESOPHOGRAM/BARIUM SWALLOW TECHNIQUE: Single contrast examination was performed using  thin barium. FLUOROSCOPY TIME:  Radiation Exposure Index (as provided by the fluoroscopic device): 47 deciGy per square cm If the device does not provide the exposure index: Fluoroscopy Time:  2 minutes 6 seconds Number of Acquired Images: COMPARISON:  Barium swallow of 07/20/2008 FINDINGS: Initially rapid sequence spot films of the cervical esophagus were performed. The swallowing mechanism is unremarkable. No penetration or aspiration is seen. Moderate tertiary contractions are noted in the mid and distal esophagus. There is a small hiatal hernia present. Moderate gastroesophageal reflux is demonstrated. A barium pill was given at the end of the study. The pill did delay in passing into the stomach just above the hiatal hernia and did not pass intact. IMPRESSION: 1. Small hiatal hernia with moderate gastroesophageal reflux. 2. Barium pill lodges just above the hiatal hernia suggesting a short segment distal esophageal stricture. 3. Moderate tertiary contractions in the mid and distal esophagus. Electronically Signed   By: Paul  Barry M.D.   On: 03/16/2016 11:47    ASSESSMENT: 74 y.o. [place] woman status post right breast upper outer quadrant biopsy 01/05/2016 for a clinical T2 N0, stage IIA  invasive ductal carcinoma, grade 2, estrogen and progesterone receptor positive, HER-2  nonamplified, with an MIB-1 of 20%   (a) biopsy of suspicious calcifications in the left breast on the same day showed a complex sclerosing lesion   (1) bilateral mastectomies with right-sided sentinel lymph node sampling 01/20/2016 found  (a) a complex sclerosing lesion but no malignancy on the left  (b) on the right, a pT2 pN0, stage IIA invasive ductal carcinoma, grade 2, with repeat HER-2 again negative  (2) Oncotype score of 3 predicts an outside the breast risk of recurrence within 10 years 4% if the patient's only systemic therapy is tamoxifen for 5 years. It also predicts no benefit from chemotherapy  (3) anastrozole started July 2017.  (4) esophageal stricture  PLAN: I gave the NBN and her daughter-in-law at copy of the pathology report from her mastectomies and went over the fact that she had a stage II breast cancer on the right, which was node negative and which "8" estrogen".  We then went over the Oncotype results in detail. She understands that this tells us that chemotherapy would not significantly improve her prognosis. That is why no chemotherapy (as well as no radiation) is planned. Women in this study from which this data was All took tamoxifen for 5 years and the data tells us that if she did take tamoxifen for 5 years she would have a 96% chance of this cancer not coming back in the next 10 years.  She is on anastrozole, which actually works a little bit better than tamoxifen. So far she is tolerating it well and she is obtaining an adequate price.  She was able to understand all this information which was given to her in writing. I wrote a prescription for her to get some procedures sees and bras and I also set her up for free massage upstairs.  She will return to see me again in March. The overall plan is   to continue anastrozole for total of 5 years. She knows to call for any problems that may develop before then.  Paul,Melanie C, MD   03/24/2016 10:10 AM Medical  Oncology and Hematology [facility] [address] , [place] [address] Tel. [phone]    Fax. [phone] I no(O1)  

## 2016-04-05 ENCOUNTER — Other Ambulatory Visit: Payer: Self-pay | Admitting: Gastroenterology

## 2016-05-01 ENCOUNTER — Encounter (HOSPITAL_COMMUNITY): Payer: Self-pay | Admitting: *Deleted

## 2016-05-02 ENCOUNTER — Encounter (HOSPITAL_COMMUNITY)
Admission: RE | Disposition: A | Discharge status: Home / Self Care | Payer: Self-pay | Source: Ambulatory Visit | Attending: Gastroenterology

## 2016-05-02 ENCOUNTER — Ambulatory Visit (HOSPITAL_COMMUNITY): Payer: Medicare Other | Admitting: Anesthesiology

## 2016-05-02 ENCOUNTER — Ambulatory Visit (HOSPITAL_COMMUNITY)
Admission: RE | Admit: 2016-05-02 | Discharge: 2016-05-02 | Disposition: A | Discharge status: Home / Self Care | Payer: Medicare Other | Source: Ambulatory Visit | Attending: Gastroenterology | Admitting: Gastroenterology

## 2016-05-02 ENCOUNTER — Encounter (HOSPITAL_COMMUNITY): Payer: Self-pay

## 2016-05-02 DIAGNOSIS — K222 Esophageal obstruction: Secondary | ICD-10-CM | POA: Insufficient documentation

## 2016-05-02 DIAGNOSIS — Z8673 Personal history of transient ischemic attack (TIA), and cerebral infarction without residual deficits: Secondary | ICD-10-CM | POA: Diagnosis not present

## 2016-05-02 DIAGNOSIS — I10 Essential (primary) hypertension: Secondary | ICD-10-CM | POA: Diagnosis not present

## 2016-05-02 DIAGNOSIS — R51 Headache: Secondary | ICD-10-CM | POA: Diagnosis not present

## 2016-05-02 DIAGNOSIS — R1310 Dysphagia, unspecified: Secondary | ICD-10-CM | POA: Diagnosis not present

## 2016-05-02 DIAGNOSIS — Z79811 Long term (current) use of aromatase inhibitors: Secondary | ICD-10-CM | POA: Diagnosis not present

## 2016-05-02 DIAGNOSIS — K219 Gastro-esophageal reflux disease without esophagitis: Secondary | ICD-10-CM | POA: Insufficient documentation

## 2016-05-02 DIAGNOSIS — D649 Anemia, unspecified: Secondary | ICD-10-CM | POA: Diagnosis not present

## 2016-05-02 DIAGNOSIS — I480 Paroxysmal atrial fibrillation: Secondary | ICD-10-CM | POA: Diagnosis not present

## 2016-05-02 DIAGNOSIS — Z79899 Other long term (current) drug therapy: Secondary | ICD-10-CM | POA: Insufficient documentation

## 2016-05-02 DIAGNOSIS — E7800 Pure hypercholesterolemia, unspecified: Secondary | ICD-10-CM | POA: Diagnosis not present

## 2016-05-02 DIAGNOSIS — Z853 Personal history of malignant neoplasm of breast: Secondary | ICD-10-CM | POA: Diagnosis not present

## 2016-05-02 DIAGNOSIS — Z9013 Acquired absence of bilateral breasts and nipples: Secondary | ICD-10-CM | POA: Insufficient documentation

## 2016-05-02 DIAGNOSIS — Z7982 Long term (current) use of aspirin: Secondary | ICD-10-CM | POA: Diagnosis not present

## 2016-05-02 HISTORY — PX: ESOPHAGOGASTRODUODENOSCOPY (EGD) WITH PROPOFOL: SHX5813

## 2016-05-02 SURGERY — ESOPHAGOGASTRODUODENOSCOPY (EGD) WITH PROPOFOL
Anesthesia: Monitor Anesthesia Care

## 2016-05-02 MED ORDER — PROPOFOL 10 MG/ML IV BOLUS
INTRAVENOUS | Status: AC
  Filled 2016-05-02: qty 40

## 2016-05-02 MED ORDER — ONDANSETRON HCL 4 MG/2ML IJ SOLN: 4.0000 mg | Freq: Once | INTRAMUSCULAR | Status: DC | PRN

## 2016-05-02 MED ORDER — FENTANYL CITRATE (PF) 100 MCG/2ML IJ SOLN: 25.0000 ug | INTRAMUSCULAR | Status: DC | PRN

## 2016-05-02 MED ORDER — LACTATED RINGERS IV SOLN
INTRAVENOUS | Status: DC | PRN
  Administered 2016-05-02: 12:00:00 via INTRAVENOUS

## 2016-05-02 MED ORDER — PROPOFOL 10 MG/ML IV BOLUS
INTRAVENOUS | Status: DC | PRN
  Administered 2016-05-02 (×3): 40 mg via INTRAVENOUS
  Administered 2016-05-02 (×5): 20 mg via INTRAVENOUS

## 2016-05-02 MED ORDER — LACTATED RINGERS IV SOLN
INTRAVENOUS | Status: DC
  Administered 2016-05-02: 1000 mL via INTRAVENOUS

## 2016-05-02 MED ORDER — SODIUM CHLORIDE 0.9 % IV SOLN: INTRAVENOUS | Status: DC

## 2016-05-02 SURGICAL SUPPLY — 15 items

## 2016-05-02 NOTE — Anesthesia Preprocedure Evaluation (Addendum)
Anesthesia Evaluation  Patient identified by MRN, date of birth, ID band Patient awake    Reviewed: Allergy & Precautions, NPO status , Patient's Chart, lab work & pertinent test results, reviewed documented beta blocker date and time   Airway Mallampati: I  TM Distance: >3 FB Neck ROM: Full    Dental  (+) Dental Advisory Given, Edentulous Lower, Edentulous Upper   Pulmonary neg pulmonary ROS,    Pulmonary exam normal breath sounds clear to auscultation       Cardiovascular hypertension, Pt. on medications and Pt. on home beta blockers (-) angina(-) Past MI Normal cardiovascular exam+ dysrhythmias Atrial Fibrillation  Rhythm:Regular Rate:Normal     Neuro/Psych  Headaches, PSYCHIATRIC DISORDERS Depression CVA, No Residual Symptoms    GI/Hepatic GERD  Medicated,(+)     substance abuse  alcohol use,   Endo/Other  negative endocrine ROS  Renal/GU negative Renal ROS     Musculoskeletal  (+) Arthritis , Osteoarthritis,    Abdominal   Peds  Hematology  (+) Blood dyscrasia, anemia ,   Anesthesia Other Findings Day of surgery medications reviewed with the patient  Right breast cancer s/p mastectomy   Reproductive/Obstetrics                            Anesthesia Physical Anesthesia Plan  ASA: III  Anesthesia Plan: MAC   Post-op Pain Management:    Induction: Intravenous  Airway Management Planned: Nasal Cannula  Additional Equipment:   Intra-op Plan:   Post-operative Plan:   Informed Consent: I have reviewed the patients History and Physical, chart, labs and discussed the procedure including the risks, benefits and alternatives for the proposed anesthesia with the patient or authorized representative who has indicated his/her understanding and acceptance.   Dental advisory given  Plan Discussed with: CRNA and Anesthesiologist  Anesthesia Plan Comments: (Discussed  risks/benefits/alternatives to MAC sedation including need for ventilatory support, hypotension, need for conversion to general anesthesia.  All patient questions answered.  Patient/guardian wishes to proceed.)       Anesthesia Quick Evaluation

## 2016-05-02 NOTE — H&P (Signed)
Problem: Esophageal dysphagia due to a distal esophageal stricture. 03/16/2016 barium esophagram showed a distal esophageal stricture  History: The patient is a 75 year old female born 1941/01/03. She has chronic solid food esophageal dysphagia unassociated with heartburn or odynophagia. Barium esophagram with tablet showed a small hiatal hernia and probable distal esophageal stricture causing hang-up of the ingested barium tablet.  I discussed with her the complications associated with diagnostic esophagogastroduodenoscopy with esophageal stricture dilation including intestinal bleeding and intestinal perforation.  She is scheduled to undergo diagnostic esophagogastroduodenoscopy with esophageal stricture dilation.  Past medical history: Gastroesophageal reflux. Hypertension. Kidney stone. Paroxysmal atrial fibrillation. Hypercholesterolemia. Fibrocystic breast disease. Remote left frontal cerebral infarct. Right parietal stroke. Invasive ductal carcinoma of the right breast. Bilateral mastectomies. Hysterectomy.  Medication allergies: Vicodin  Exam: The patient is alert and lying comfortably on the endoscopy stretcher. Abdomen is soft and nontender to palpation. Lungs are clear to auscultation. Cardiac exam reveals a regular rhythm.  Plan: Proceed with diagnostic esophagogastroduodenoscopy with esophageal stricture dilation

## 2016-05-02 NOTE — Anesthesia Postprocedure Evaluation (Signed)
Anesthesia Post Note  Patient: TAWATHA HOLIK  Procedure(s) Performed: Procedure(s) (LRB): ESOPHAGOGASTRODUODENOSCOPY (EGD) WITH PROPOFOL (N/A)  Patient location during evaluation: PACU Anesthesia Type: MAC Level of consciousness: awake and alert Pain management: pain level controlled Vital Signs Assessment: post-procedure vital signs reviewed and stable Respiratory status: spontaneous breathing, nonlabored ventilation, respiratory function stable and patient connected to nasal cannula oxygen Cardiovascular status: stable and blood pressure returned to baseline Anesthetic complications: no    Last Vitals:  Vitals:   05/02/16 1310 05/02/16 1320  BP: (!) 120/43 (!) 162/87  Pulse: 64 66  Resp: 18 15  Temp:      Last Pain:  Vitals:   05/02/16 1305  TempSrc: Oral                 Catalina Gravel

## 2016-05-02 NOTE — Op Note (Signed)
[facility] Patient Name: [name] Procedure Date: 05/02/2016 MRN: [id number] Attending MD: Garlan Fair , MD Date of Birth: 01/05/1941 CSN: [id number] Age: 75 Admit Type: Outpatient Procedure:                Upper GI endoscopy Indications:              Dysphagia, Stenosis of the esophagus Providers:                Garlan Fair, MD, Hilma Favors, RN, Corliss Parish, Technician Referring MD:              Medicines:                Propofol per Anesthesia Complications:            No immediate complications. Estimated Blood Loss:     Estimated blood loss was minimal. Procedure:                Pre-Anesthesia Assessment:                           - Prior to the procedure, a History and Physical                            was performed, and patient medications and                            allergies were reviewed. The patient's tolerance of                            previous anesthesia was also reviewed. The risks                            and benefits of the procedure and the sedation                            options and risks were discussed with the patient.                            All questions were answered, and informed consent                            was obtained. Prior Anticoagulants: The patient has                            taken aspirin, last dose was 1 day prior to                            procedure. ASA Grade Assessment: II - A patient                            with mild systemic disease. After reviewing the  risks and benefits, the patient was deemed in                            satisfactory condition to undergo the procedure.                           After obtaining informed consent, the endoscope was                            passed under direct vision. Throughout the                            procedure, the patient's blood pressure, pulse, and   oxygen saturations were monitored continuously. The                            [id number]) scope was introduced through the                            mouth, and advanced to the second part of duodenum.                            The upper GI endoscopy was accomplished without                            difficulty. The patient tolerated the procedure                            well. Scope In: Scope Out: Findings:      The Z-line was regular and was found 38 cm from the incisors.      One mild benign-appearing, intrinsic stenosis was found 38 cm from the       incisors. This measured less than one cm (in length). The benign distal       esophageal stricture was easily traversed with the Pentax gastroscope. A       TTS dilator was passed through the scope. Dilation with a 15-16.5-18 mm       balloon dilator was performed to 18 mm without apparent complications.      The entire examined stomach was normal.      The examined duodenum was normal. Impression:               - Z-line regular, 38 cm from the incisors.                           - Benign-appearing esophageal stenosis. Dilated.                           - Normal stomach.                           - Normal examined duodenum.                           - No specimens collected. Moderate Sedation:      N/A- Per Anesthesia Care Recommendation:           -  Patient has a contact number available for                            emergencies. The signs and symptoms of potential                            delayed complications were discussed with the                            patient. Return to normal activities tomorrow.                            Written discharge instructions were provided to the                            patient.                           - Return to primary care physician PRN.                           - Resume previous diet.                           - Continue present medications. Procedure Code(s):         --- Professional ---                           [phone], Esophagogastroduodenoscopy, flexible,                            transoral; with transendoscopic balloon dilation of                            esophagus (less than 30 mm diameter) Diagnosis Code(s):        --- Professional ---                           K22.2, Esophageal obstruction                           R13.10, Dysphagia, unspecified CPT copyright 2016 American Medical Association. All rights reserved. The codes documented in this report are preliminary and upon coder review may  be revised to meet current compliance requirements. Earle Gell, MD Garlan Fair, MD 05/02/2016 1:02:09 PM This report has been signed electronically. Number of Addenda: 0

## 2016-05-02 NOTE — Discharge Instructions (Signed)

## 2016-05-02 NOTE — Transfer of Care (Signed)
Immediate Anesthesia Transfer of Care Note  Patient: [name]  Procedure(s) Performed: Procedure(s): ESOPHAGOGASTRODUODENOSCOPY (EGD) WITH PROPOFOL (N/A)  Patient Location: PACU and Endoscopy Unit  Anesthesia Type:MAC  Level of Consciousness: awake, oriented, patient cooperative, lethargic and responds to stimulation  Airway & Oxygen Therapy: Patient Spontanous Breathing and Patient connected to nasal cannula oxygen  Post-op Assessment: Report given to RN, Post -op Vital signs reviewed and stable and Patient moving all extremities  Post vital signs: Reviewed and stable  Last Vitals:  Vitals:   05/02/16 1140  BP: (!) 146/65  Pulse: 65  Resp: 16  Temp: 36.7 C    Last Pain:  Vitals:   05/02/16 1140  TempSrc: Oral         Complications: No apparent anesthesia complications

## 2016-06-15 DIAGNOSIS — I10 Essential (primary) hypertension: Secondary | ICD-10-CM | POA: Diagnosis not present

## 2016-06-23 DIAGNOSIS — E538 Deficiency of other specified B group vitamins: Secondary | ICD-10-CM | POA: Diagnosis not present

## 2016-07-19 ENCOUNTER — Emergency Department (HOSPITAL_COMMUNITY): Payer: Medicare Other

## 2016-07-19 ENCOUNTER — Emergency Department (HOSPITAL_COMMUNITY)
Admission: EM | Admit: 2016-07-19 | Discharge: 2016-07-19 | Disposition: A | Discharge status: Home / Self Care | Payer: Medicare Other | Attending: Emergency Medicine | Admitting: Emergency Medicine

## 2016-07-19 ENCOUNTER — Encounter (HOSPITAL_COMMUNITY): Payer: Self-pay | Admitting: Emergency Medicine

## 2016-07-19 DIAGNOSIS — M79671 Pain in right foot: Secondary | ICD-10-CM | POA: Diagnosis not present

## 2016-07-19 DIAGNOSIS — Z853 Personal history of malignant neoplasm of breast: Secondary | ICD-10-CM | POA: Diagnosis not present

## 2016-07-19 DIAGNOSIS — M25571 Pain in right ankle and joints of right foot: Secondary | ICD-10-CM | POA: Diagnosis not present

## 2016-07-19 DIAGNOSIS — Z8673 Personal history of transient ischemic attack (TIA), and cerebral infarction without residual deficits: Secondary | ICD-10-CM | POA: Diagnosis not present

## 2016-07-19 DIAGNOSIS — Y939 Activity, unspecified: Secondary | ICD-10-CM | POA: Insufficient documentation

## 2016-07-19 DIAGNOSIS — W228XXA Striking against or struck by other objects, initial encounter: Secondary | ICD-10-CM | POA: Insufficient documentation

## 2016-07-19 DIAGNOSIS — Z96642 Presence of left artificial hip joint: Secondary | ICD-10-CM | POA: Insufficient documentation

## 2016-07-19 DIAGNOSIS — Y999 Unspecified external cause status: Secondary | ICD-10-CM | POA: Diagnosis not present

## 2016-07-19 DIAGNOSIS — Z79899 Other long term (current) drug therapy: Secondary | ICD-10-CM | POA: Diagnosis not present

## 2016-07-19 DIAGNOSIS — I10 Essential (primary) hypertension: Secondary | ICD-10-CM | POA: Diagnosis not present

## 2016-07-19 DIAGNOSIS — S99911A Unspecified injury of right ankle, initial encounter: Secondary | ICD-10-CM | POA: Diagnosis not present

## 2016-07-19 DIAGNOSIS — Y929 Unspecified place or not applicable: Secondary | ICD-10-CM | POA: Insufficient documentation

## 2016-07-19 DIAGNOSIS — S99921A Unspecified injury of right foot, initial encounter: Secondary | ICD-10-CM | POA: Diagnosis present

## 2016-07-19 DIAGNOSIS — Z7982 Long term (current) use of aspirin: Secondary | ICD-10-CM | POA: Diagnosis not present

## 2016-07-19 DIAGNOSIS — S93601A Unspecified sprain of right foot, initial encounter: Secondary | ICD-10-CM | POA: Insufficient documentation

## 2016-07-19 NOTE — ED Triage Notes (Signed)
Per pt, states right ankle pain and swelling-states no injury-states she has had gout before-doesn't know if that is what is causing discomfory

## 2016-07-19 NOTE — Discharge Instructions (Signed)
Your x-rays are reassuring and showed no fracture. You likely had a foot sprain as we discussed. Continue using Tylenol and Motrin for discomfort. Follow-up with your doctor next week for reevaluation. Return to ED for any new or worsening symptoms.

## 2016-07-19 NOTE — ED Notes (Signed)
Pt c/o R ankle and foot pain x 3 days.  Denies injury.  Pt reports waking up w/ the pain.  Pain score 8/10.

## 2016-07-19 NOTE — ED Provider Notes (Signed)
[facility] DEPT Provider Note   CSN: [id number] Arrival date & time: 07/19/16  [id number]     History   Chief Complaint Chief Complaint  Patient presents with  . Ankle Pain    HPI [name] is a 75 y.o. female.  HPI here for evaluation of right foot pain. Patient reports on Sunday, she accidentally hit her foot at the bottom of the bed. She reports since that time and gradual worsening, aching pain at the end of her foot. She denies any numbness, weakness, fevers or chills, leg swelling. Nothing tried to improve her symptoms. Walking worsens discomfort.  Past Medical History:  Diagnosis Date  . Alcohol abuse   . Anemia   . Arthritis   . Bacterial vaginosis   . Cancer ([place]) 01/2015   Breast cancer right   . Cerebrovascular accident (stroke) ([place]) 2/07   left frontal lobe  . Closed left hip fracture ([place]) 5/14  . Complication of anesthesia    woke up too early  with kidney stone surgery and wrist surgery  . Depression   . Dupuytren's contracture   . Fibrocystic breast disease   . Fracture of metatarsal bone of right foot    5th metatarsal  . GERD (gastroesophageal reflux disease)   . Gout   . Headache   . Hyperlipidemia LDL goal <70   . Hypertension   . Paroxysmal a-fib ([place])   . Renal calculus   . Vitamin B12 deficiency   . Vitamin D deficiency     Patient Active Problem List   Diagnosis Date Noted  . Breast cancer of upper-outer quadrant of right female breast ([place]) 01/07/2016  . Stroke ([place]) 06/05/2014  . Alcohol dependence ([place]) 06/05/2014  . Dyslipidemia 06/05/2014  . Syncope and collapse 06/04/2014  . Postoperative anemia due to acute blood loss 12/19/2012  . Osteoporosis 12/19/2012  . Osteoporosis with fracture 12/19/2012  . Hip fracture, left ([place]) 12/16/2012  . Gout 12/16/2012  . fracture of fifth right metatarsal bone 12/16/2012  . Alcohol abuse 12/16/2012  . GERD (gastroesophageal reflux disease) 12/16/2012  . Atrial fibrillation ([place])  12/16/2012  . B12 deficiency 12/16/2012  . Vitamin D deficiency 12/16/2012  . Esophageal stricture 12/16/2012  . Other and unspecified hyperlipidemia 12/16/2012  . Reactive depression (situational) 12/16/2012  . Insomnia 12/16/2012  . H/O: CVA (cerebrovascular accident) 12/16/2012  . Dupuytren's contracture of both hands 12/16/2012  . Fibrocystic breast disease 12/16/2012    Past Surgical History:  Procedure Laterality Date  . ABDOMINAL HYSTERECTOMY    . cataracts Bilateral   . ESOPHAGOGASTRODUODENOSCOPY (EGD) WITH PROPOFOL N/A 05/02/2016   Procedure: ESOPHAGOGASTRODUODENOSCOPY (EGD) WITH PROPOFOL;  Surgeon: Garlan Fair, MD;  Location: [facility];  Service: Endoscopy;  Laterality: N/A;  . HIP ARTHROPLASTY Left 12/17/2012   Procedure: LEFT HIP HEMI ARTHROPLASTY ;  Surgeon: Rozanna Box, MD;  Location: [facility];  Service: Orthopedics;  Laterality: Left;  . left wrist surgery surgery  7 yrs ago  . LEG SURGERY Left   . LITHOTRIPSY    . MASTECTOMY W/ SENTINEL NODE BIOPSY Bilateral 01/20/2016   Procedure: BILATERAL TOTAL MASTECTOMY WITH RIGHT SENTINEL LYMPH NODE BIOPSY;  Surgeon: Rolm Bookbinder, MD;  Location: [facility];  Service: General;  Laterality: Bilateral;  . WRIST SURGERY     left wrist    OB History    No data available       Home Medications    Prior to Admission medications   Medication Sig Start Date End  Date Taking? Authorizing Provider  amLODipine (NORVASC) 5 MG tablet Take 5 mg by mouth daily.    Historical Provider, MD  anastrozole (ARIMIDEX) 1 MG tablet Take 1 tablet (1 mg total) by mouth daily. 03/06/16   Chauncey Cruel, MD  aspirin EC 325 MG tablet Take 1 tablet (325 mg total) by mouth daily. 06/06/14   Modena Jansky, MD  atenolol (TENORMIN) 25 MG tablet Take 1 tablet (25 mg total) by mouth daily. 06/06/14   Modena Jansky, MD  cholecalciferol (VITAMIN D) 1000 UNITS tablet Take 1,000 Units by mouth daily.    Historical Provider, MD  folic acid  (FOLVITE) 1 MG tablet Take 1 tablet (1 mg total) by mouth daily. 06/06/14   Modena Jansky, MD  ibuprofen (ADVIL) 200 MG tablet Take 400 mg by mouth every 6 (six) hours as needed for fever, headache or mild pain. Reported on 01/12/2016    Historical Provider, MD  lactose free nutrition (BOOST PLUS) LIQD Take 237 mLs by mouth 2 (two) times daily between meals. Patient taking differently: Take 237 mLs by mouth daily.  06/06/14   Modena Jansky, MD  methocarbamol (ROBAXIN) 500 MG tablet Take 1 tablet (500 mg total) by mouth every 6 (six) hours as needed for muscle spasms. 01/22/16   Rolm Bookbinder, MD  simvastatin (ZOCOR) 20 MG tablet Take 1 tablet (20 mg total) by mouth daily at 6 PM. 06/06/14   Modena Jansky, MD  thiamine 100 MG tablet Take 1 tablet (100 mg total) by mouth daily. 06/06/14   Modena Jansky, MD    Family History Family History  Problem Relation Age of Onset  . Stroke Mother     Social History Social History  Substance Use Topics  . Smoking status: Never Smoker  . Smokeless tobacco: Current User    Types: Chew  . Alcohol use Yes     Comment: daily -drinks a quart of beer     Allergies   Ambien [zolpidem tartrate]   Review of Systems Review of Systems A 10 point review of systems was completed and was negative except for pertinent positives and negatives as mentioned in the history of present illness    Physical Exam Updated Vital Signs BP 147/81 (BP Location: Left Arm)   Pulse 63   Temp 97.7 F (36.5 C) (Oral)   Resp 16   SpO2 100%   Physical Exam  Constitutional: She appears well-developed. No distress.  Awake, alert and nontoxic in appearance  HENT:  Head: Normocephalic and atraumatic.  Right Ear: External ear normal.  Left Ear: External ear normal.  Mouth/Throat: Oropharynx is clear and moist.  Eyes: Conjunctivae and EOM are normal.  Neck: Normal range of motion. No JVD present.  Pulmonary/Chest: Effort normal. No stridor. No respiratory  distress.  Abdominal: Soft. There is no tenderness.  Musculoskeletal: Normal range of motion.  Mild tenderness to palpation at distal second, third, fourth metatarsals. No obvious deformity, swelling or skin changes. Distal pulses intact, brisk cap refill. Sensation intact distal to injury.  Neurological:  Awake, alert, cooperative and aware of situation; motor strength bilaterally; sensation normal to light touch bilaterally; no facial asymmetry; tongue midline; major cranial nerves appear intact;  baseline gait without new ataxia.  Skin: No rash noted. She is not diaphoretic.  Psychiatric: She has a normal mood and affect. Her behavior is normal. Thought content normal.  Nursing note and vitals reviewed.    ED Treatments / Results  Labs (all  labs ordered are listed, but only abnormal results are displayed) Labs Reviewed - No data to display  EKG  EKG Interpretation None       Radiology Dg Ankle Complete Right  Result Date: 07/19/2016 CLINICAL DATA:  Pain.  Injury. EXAM: RIGHT ANKLE - COMPLETE 3+ VIEW COMPARISON:  12/09/2012. FINDINGS: Diffuse osteopenia and degenerative change. No acute bony abnormality identified. No evidence of fracture or dislocation. IMPRESSION: Diffuse osteopenia and degenerative change. No acute abnormality identified. Electronically Signed   By: Marcello Moores  Register   On: 07/19/2016 09:19   Dg Foot Complete Right  Result Date: 07/19/2016 CLINICAL DATA:  Hit foot on bed last Sunday. Pain across metatarsals. EXAM: RIGHT FOOT COMPLETE - 3+ VIEW COMPARISON:  None. FINDINGS: Old healed fifth metatarsal shaft fracture. No acute fracture, subluxation or dislocation. Mild osteopenia. Early degenerative changes at the first MTP joint. Soft tissues are intact. IMPRESSION: No acute bony abnormality. Electronically Signed   By: Rolm Baptise M.D.   On: 07/19/2016 10:53    Procedures Procedures (including critical care time)  Medications Ordered in ED Medications - No  data to display   Initial Impression / Assessment and Plan / ED Course  I have reviewed the triage vital signs and the nursing notes.  Pertinent labs & imaging results that were available during my care of the patient were reviewed by me and considered in my medical decision making (see chart for details).  Clinical Course     Exam overall reassuring. X-ray of ankle and foot are negative for any acute fracture or dislocation. She does have old, healed fifth metatarsal fracture. Requests postop shoe for comfort. Continue with Rice therapy, Tylenol and Motrin for discomfort. Follow-up PCP next week. Return precautions discussed  Final Clinical Impressions(s) / ED Diagnoses   Final diagnoses:  Sprain of right foot, initial encounter    New Prescriptions New Prescriptions   No medications on file     Comer Locket, PA-C 07/19/16 [place], MD 07/19/16 [id number]

## 2016-07-24 DIAGNOSIS — E538 Deficiency of other specified B group vitamins: Secondary | ICD-10-CM | POA: Diagnosis not present

## 2016-07-26 DIAGNOSIS — S92511A Displaced fracture of proximal phalanx of right lesser toe(s), initial encounter for closed fracture: Secondary | ICD-10-CM | POA: Diagnosis not present

## 2016-08-16 DIAGNOSIS — S92511D Displaced fracture of proximal phalanx of right lesser toe(s), subsequent encounter for fracture with routine healing: Secondary | ICD-10-CM | POA: Diagnosis not present

## 2016-10-23 ENCOUNTER — Encounter: Payer: Self-pay | Admitting: Oncology

## 2016-10-23 ENCOUNTER — Ambulatory Visit: Payer: Medicare Other | Admitting: Oncology

## 2016-10-23 ENCOUNTER — Other Ambulatory Visit: Payer: Medicare Other

## 2017-04-10 ENCOUNTER — Emergency Department (HOSPITAL_COMMUNITY): Payer: Medicare Other

## 2017-04-10 ENCOUNTER — Encounter (HOSPITAL_COMMUNITY): Payer: Self-pay | Admitting: Emergency Medicine

## 2017-04-10 ENCOUNTER — Inpatient Hospital Stay (HOSPITAL_COMMUNITY)
Admission: EM | Admit: 2017-04-10 | Discharge: 2017-04-12 | DRG: 690 | Disposition: A | Discharge status: Home Health | Payer: Medicare Other | Attending: Internal Medicine | Admitting: Internal Medicine

## 2017-04-10 DIAGNOSIS — R55 Syncope and collapse: Secondary | ICD-10-CM | POA: Diagnosis not present

## 2017-04-10 DIAGNOSIS — S2241XA Multiple fractures of ribs, right side, initial encounter for closed fracture: Secondary | ICD-10-CM | POA: Diagnosis not present

## 2017-04-10 DIAGNOSIS — Z79899 Other long term (current) drug therapy: Secondary | ICD-10-CM

## 2017-04-10 DIAGNOSIS — K219 Gastro-esophageal reflux disease without esophagitis: Secondary | ICD-10-CM | POA: Diagnosis present

## 2017-04-10 DIAGNOSIS — Z823 Family history of stroke: Secondary | ICD-10-CM

## 2017-04-10 DIAGNOSIS — S8001XA Contusion of right knee, initial encounter: Secondary | ICD-10-CM | POA: Diagnosis present

## 2017-04-10 DIAGNOSIS — N390 Urinary tract infection, site not specified: Secondary | ICD-10-CM | POA: Diagnosis not present

## 2017-04-10 DIAGNOSIS — M25521 Pain in right elbow: Secondary | ICD-10-CM | POA: Diagnosis not present

## 2017-04-10 DIAGNOSIS — S299XXA Unspecified injury of thorax, initial encounter: Secondary | ICD-10-CM | POA: Diagnosis not present

## 2017-04-10 DIAGNOSIS — S2239XA Fracture of one rib, unspecified side, initial encounter for closed fracture: Secondary | ICD-10-CM

## 2017-04-10 DIAGNOSIS — Z7982 Long term (current) use of aspirin: Secondary | ICD-10-CM

## 2017-04-10 DIAGNOSIS — Z888 Allergy status to other drugs, medicaments and biological substances status: Secondary | ICD-10-CM

## 2017-04-10 DIAGNOSIS — R350 Frequency of micturition: Secondary | ICD-10-CM | POA: Diagnosis not present

## 2017-04-10 DIAGNOSIS — S0990XA Unspecified injury of head, initial encounter: Secondary | ICD-10-CM | POA: Diagnosis not present

## 2017-04-10 DIAGNOSIS — I10 Essential (primary) hypertension: Secondary | ICD-10-CM | POA: Diagnosis not present

## 2017-04-10 DIAGNOSIS — M79641 Pain in right hand: Secondary | ICD-10-CM | POA: Diagnosis not present

## 2017-04-10 DIAGNOSIS — Z87442 Personal history of urinary calculi: Secondary | ICD-10-CM

## 2017-04-10 DIAGNOSIS — E559 Vitamin D deficiency, unspecified: Secondary | ICD-10-CM | POA: Diagnosis present

## 2017-04-10 DIAGNOSIS — F1010 Alcohol abuse, uncomplicated: Secondary | ICD-10-CM | POA: Diagnosis present

## 2017-04-10 DIAGNOSIS — W010XXA Fall on same level from slipping, tripping and stumbling without subsequent striking against object, initial encounter: Secondary | ICD-10-CM | POA: Diagnosis present

## 2017-04-10 DIAGNOSIS — E785 Hyperlipidemia, unspecified: Secondary | ICD-10-CM | POA: Diagnosis present

## 2017-04-10 DIAGNOSIS — I480 Paroxysmal atrial fibrillation: Secondary | ICD-10-CM | POA: Diagnosis present

## 2017-04-10 DIAGNOSIS — M25511 Pain in right shoulder: Secondary | ICD-10-CM | POA: Diagnosis not present

## 2017-04-10 DIAGNOSIS — Z8673 Personal history of transient ischemic attack (TIA), and cerebral infarction without residual deficits: Secondary | ICD-10-CM

## 2017-04-10 DIAGNOSIS — Z90710 Acquired absence of both cervix and uterus: Secondary | ICD-10-CM

## 2017-04-10 DIAGNOSIS — S7001XA Contusion of right hip, initial encounter: Secondary | ICD-10-CM | POA: Diagnosis present

## 2017-04-10 DIAGNOSIS — Z96642 Presence of left artificial hip joint: Secondary | ICD-10-CM | POA: Diagnosis present

## 2017-04-10 DIAGNOSIS — M25561 Pain in right knee: Secondary | ICD-10-CM | POA: Diagnosis not present

## 2017-04-10 DIAGNOSIS — W19XXXA Unspecified fall, initial encounter: Secondary | ICD-10-CM | POA: Diagnosis present

## 2017-04-10 DIAGNOSIS — N6019 Diffuse cystic mastopathy of unspecified breast: Secondary | ICD-10-CM | POA: Diagnosis present

## 2017-04-10 DIAGNOSIS — R32 Unspecified urinary incontinence: Secondary | ICD-10-CM | POA: Diagnosis present

## 2017-04-10 DIAGNOSIS — S8991XA Unspecified injury of right lower leg, initial encounter: Secondary | ICD-10-CM | POA: Diagnosis not present

## 2017-04-10 DIAGNOSIS — S79911A Unspecified injury of right hip, initial encounter: Secondary | ICD-10-CM | POA: Diagnosis not present

## 2017-04-10 DIAGNOSIS — Z170 Estrogen receptor positive status [ER+]: Secondary | ICD-10-CM

## 2017-04-10 DIAGNOSIS — E538 Deficiency of other specified B group vitamins: Secondary | ICD-10-CM | POA: Diagnosis present

## 2017-04-10 DIAGNOSIS — M25531 Pain in right wrist: Secondary | ICD-10-CM | POA: Diagnosis not present

## 2017-04-10 DIAGNOSIS — Y9222 Religious institution as the place of occurrence of the external cause: Secondary | ICD-10-CM

## 2017-04-10 DIAGNOSIS — C50411 Malignant neoplasm of upper-outer quadrant of right female breast: Secondary | ICD-10-CM | POA: Diagnosis present

## 2017-04-10 LAB — URINALYSIS, ROUTINE W REFLEX MICROSCOPIC
BILIRUBIN URINE: NEGATIVE
Glucose, UA: NEGATIVE mg/dL
KETONES UR: NEGATIVE mg/dL
Nitrite: NEGATIVE
Protein, ur: NEGATIVE mg/dL
SPECIFIC GRAVITY, URINE: 1.008 (ref 1.005–1.030)
pH: 5 (ref 5.0–8.0)

## 2017-04-10 LAB — CBC
HCT: 48.4 % — ABNORMAL HIGH (ref 36.0–46.0)
Hemoglobin: 16.5 g/dL — ABNORMAL HIGH (ref 12.0–15.0)
MCH: 32.7 pg (ref 26.0–34.0)
MCHC: 34.1 g/dL (ref 30.0–36.0)
MCV: 95.8 fL (ref 78.0–100.0)
PLATELETS: 259 10*3/uL (ref 150–400)
RBC: 5.05 MIL/uL (ref 3.87–5.11)
RDW: 12.5 % (ref 11.5–15.5)
WBC: 10.5 10*3/uL (ref 4.0–10.5)

## 2017-04-10 LAB — POCT I-STAT TROPONIN I: TROPONIN I, POC: 0 ng/mL (ref 0.00–0.08)

## 2017-04-10 LAB — BASIC METABOLIC PANEL
Anion gap: 10 (ref 5–15)
BUN: 6 mg/dL (ref 6–20)
CALCIUM: 8.9 mg/dL (ref 8.9–10.3)
CHLORIDE: 99 mmol/L — AB (ref 101–111)
CO2: 24 mmol/L (ref 22–32)
CREATININE: 0.67 mg/dL (ref 0.44–1.00)
GFR calc non Af Amer: >60 mL/min (ref >60)
GLUCOSE: 119 mg/dL — AB (ref 65–99)
Potassium: 4.4 mmol/L (ref 3.5–5.1)
Sodium: 133 mmol/L — ABNORMAL LOW (ref 135–145)

## 2017-04-10 LAB — GLUCOSE, CAPILLARY: GLUCOSE-CAPILLARY: 105 mg/dL — AB (ref 65–99)

## 2017-04-10 MED ORDER — LORAZEPAM 1 MG PO TABS: 1.0000 mg | ORAL_TABLET | Freq: Four times a day (QID) | ORAL | Status: DC | PRN

## 2017-04-10 MED ORDER — DOCUSATE SODIUM 100 MG PO CAPS
100.0000 mg | ORAL_CAPSULE | Freq: Two times a day (BID) | ORAL | Status: DC
  Administered 2017-04-10 – 2017-04-12 (×4): 100 mg via ORAL
  Filled 2017-04-10 (×4): qty 1

## 2017-04-10 MED ORDER — ACETAMINOPHEN 325 MG PO TABS
650.0000 mg | ORAL_TABLET | Freq: Four times a day (QID) | ORAL | Status: DC | PRN
  Administered 2017-04-11 – 2017-04-12 (×3): 650 mg via ORAL
  Filled 2017-04-10 (×3): qty 2

## 2017-04-10 MED ORDER — HYDROCODONE-ACETAMINOPHEN 5-325 MG PO TABS: 1.0000 | ORAL_TABLET | Freq: Three times a day (TID) | ORAL | 0 refills | Status: DC | PRN

## 2017-04-10 MED ORDER — LORAZEPAM 1 MG PO TABS
0.0000 mg | ORAL_TABLET | Freq: Four times a day (QID) | ORAL | Status: DC
  Administered 2017-04-11: 1 mg via ORAL
  Filled 2017-04-10: qty 1

## 2017-04-10 MED ORDER — CEPHALEXIN 500 MG PO CAPS
500.0000 mg | ORAL_CAPSULE | Freq: Once | ORAL | Status: AC
  Administered 2017-04-10: 500 mg via ORAL
  Filled 2017-04-10: qty 1

## 2017-04-10 MED ORDER — CEPHALEXIN 500 MG PO CAPS: 500.0000 mg | ORAL_CAPSULE | Freq: Two times a day (BID) | ORAL | 0 refills | Status: DC

## 2017-04-10 MED ORDER — THIAMINE HCL 100 MG/ML IJ SOLN: 100.0000 mg | Freq: Every day | INTRAMUSCULAR | Status: DC

## 2017-04-10 MED ORDER — ADULT MULTIVITAMIN W/MINERALS CH
1.0000 | ORAL_TABLET | Freq: Every day | ORAL | Status: DC
  Administered 2017-04-10 – 2017-04-12 (×3): 1 via ORAL
  Filled 2017-04-10 (×3): qty 1

## 2017-04-10 MED ORDER — ACETAMINOPHEN 650 MG RE SUPP: 650.0000 mg | Freq: Four times a day (QID) | RECTAL | Status: DC | PRN

## 2017-04-10 MED ORDER — LORAZEPAM 1 MG PO TABS: 0.0000 mg | ORAL_TABLET | Freq: Two times a day (BID) | ORAL | Status: DC

## 2017-04-10 MED ORDER — ONDANSETRON HCL 4 MG/2ML IJ SOLN: 4.0000 mg | Freq: Four times a day (QID) | INTRAMUSCULAR | Status: DC | PRN

## 2017-04-10 MED ORDER — HYDRALAZINE HCL 20 MG/ML IJ SOLN
10.0000 mg | Freq: Four times a day (QID) | INTRAMUSCULAR | Status: DC | PRN
  Filled 2017-04-10: qty 0.5

## 2017-04-10 MED ORDER — SODIUM CHLORIDE 0.9 % IV SOLN
INTRAVENOUS | Status: AC
  Administered 2017-04-10: 22:00:00 via INTRAVENOUS

## 2017-04-10 MED ORDER — DEXTROSE 5 % IV SOLN
1.0000 g | INTRAVENOUS | Status: DC
  Administered 2017-04-10 – 2017-04-11 (×2): 1 g via INTRAVENOUS
  Filled 2017-04-10 (×3): qty 10

## 2017-04-10 MED ORDER — LORAZEPAM 2 MG/ML IJ SOLN: 1.0000 mg | Freq: Four times a day (QID) | INTRAMUSCULAR | Status: DC | PRN

## 2017-04-10 MED ORDER — ONDANSETRON HCL 4 MG PO TABS: 4.0000 mg | ORAL_TABLET | Freq: Four times a day (QID) | ORAL | Status: DC | PRN

## 2017-04-10 MED ORDER — FOLIC ACID 1 MG PO TABS
1.0000 mg | ORAL_TABLET | Freq: Every day | ORAL | Status: DC
  Administered 2017-04-10 – 2017-04-12 (×3): 1 mg via ORAL
  Filled 2017-04-10 (×3): qty 1

## 2017-04-10 MED ORDER — AMLODIPINE BESYLATE 5 MG PO TABS
5.0000 mg | ORAL_TABLET | Freq: Every day | ORAL | Status: DC
  Administered 2017-04-10 – 2017-04-12 (×3): 5 mg via ORAL
  Filled 2017-04-10 (×3): qty 1

## 2017-04-10 MED ORDER — ATENOLOL 25 MG PO TABS
25.0000 mg | ORAL_TABLET | Freq: Every day | ORAL | Status: DC
  Administered 2017-04-10 – 2017-04-12 (×3): 25 mg via ORAL
  Filled 2017-04-10 (×3): qty 1

## 2017-04-10 MED ORDER — VITAMIN B-1 100 MG PO TABS
100.0000 mg | ORAL_TABLET | Freq: Every day | ORAL | Status: DC
  Administered 2017-04-10 – 2017-04-12 (×3): 100 mg via ORAL
  Filled 2017-04-10 (×3): qty 1

## 2017-04-10 MED ORDER — OXYCODONE HCL 5 MG PO TABS
5.0000 mg | ORAL_TABLET | ORAL | Status: DC | PRN
  Administered 2017-04-10 – 2017-04-11 (×3): 5 mg via ORAL
  Filled 2017-04-10 (×3): qty 1

## 2017-04-10 MED ORDER — HYDROCODONE-ACETAMINOPHEN 5-325 MG PO TABS
1.0000 | ORAL_TABLET | Freq: Once | ORAL | Status: AC
  Administered 2017-04-10: 1 via ORAL
  Filled 2017-04-10: qty 1

## 2017-04-10 NOTE — ED Notes (Signed)
Report 1930. [place] [phone]

## 2017-04-10 NOTE — ED Triage Notes (Signed)
Pt had syncopal episode yesterday, fell and struck face, knee, wrist. No symptoms prior to syncope; pt awoke on ground without memory of fall. No dizziness, chest discomfort, SOB, palpitations, new onset confusion, anticoagulants. No current chemotherapy or radiation. Hx breast cancer.

## 2017-04-10 NOTE — ED Notes (Signed)
Pt asked for overnight observation.  MD Ralene Bathe aware and at bedside

## 2017-04-10 NOTE — ED Provider Notes (Signed)
[facility] DEPT Provider Note   CSN: [id number] Arrival date & time: 04/10/17  1011     History   Chief Complaint Chief Complaint  Patient presents with  . Loss of Consciousness  . Fall    HPI [name] is a 76 y.o. female.  The history is provided by the patient and a relative. No language interpreter was used.  Loss of Consciousness    Fall     [name] is a 76 y.o. female who presents to the Emergency Department complaining of fall.  Yesterday while she was leaving church she tripped and fell, landing onto her left side and face. It is unclear if she lost consciousness or not but she did take about 15 minutes to be able to regain her strength and get back up.  She reports pain to her face, right shoulder and wrist, right hip and knee. She has pain with ambulation. She does report some right-sided chest pain that is worse with deep breaths. She was feeling well prior to the fall. Symptoms are moderate and constant nature.  Past Medical History:  Diagnosis Date  . Alcohol abuse   . Anemia   . Arthritis   . Bacterial vaginosis   . Cancer ([place]) 01/2015   Breast cancer right   . Cerebrovascular accident (stroke) ([place]) 2/07   left frontal lobe  . Closed left hip fracture ([place]) 5/14  . Complication of anesthesia    woke up too early  with kidney stone surgery and wrist surgery  . Depression   . Dupuytren's contracture   . Fibrocystic breast disease   . Fracture of metatarsal bone of right foot    5th metatarsal  . GERD (gastroesophageal reflux disease)   . Gout   . Headache   . Hyperlipidemia LDL goal <70   . Hypertension   . Paroxysmal A-fib ([place])   . Renal calculus   . Vitamin B12 deficiency   . Vitamin D deficiency     Patient Active Problem List   Diagnosis Date Noted  . Breast cancer of upper-outer quadrant of right female breast ([place]) 01/07/2016  . Stroke ([place]) 06/05/2014  . Alcohol dependence ([place]) 06/05/2014  . Dyslipidemia 06/05/2014  .  Syncope and collapse 06/04/2014  . Postoperative anemia due to acute blood loss 12/19/2012  . Osteoporosis 12/19/2012  . Osteoporosis with fracture 12/19/2012  . Hip fracture, left ([place]) 12/16/2012  . Gout 12/16/2012  . fracture of fifth right metatarsal bone 12/16/2012  . Alcohol abuse 12/16/2012  . GERD (gastroesophageal reflux disease) 12/16/2012  . Atrial fibrillation ([place]) 12/16/2012  . B12 deficiency 12/16/2012  . Vitamin D deficiency 12/16/2012  . Esophageal stricture 12/16/2012  . Other and unspecified hyperlipidemia 12/16/2012  . Reactive depression (situ[place]) 12/16/2012  . Insomnia 12/16/2012  . H/O: CVA (cerebrovascular accident) 12/16/2012  . Dupuytren's contracture of both hands 12/16/2012  . Fibrocystic breast disease 12/16/2012    Past Surgical History:  Procedure Laterality Date  . ABDOMINAL HYSTERECTOMY    . cataracts Bilateral   . ESOPHAGOGASTRODUODENOSCOPY (EGD) WITH PROPOFOL N/A 05/02/2016   Procedure: ESOPHAGOGASTRODUODENOSCOPY (EGD) WITH PROPOFOL;  Surgeon: Garlan Fair, MD;  Location: [facility];  Service: Endoscopy;  Laterality: N/A;  . HIP ARTHROPLASTY Left 12/17/2012   Procedure: LEFT HIP HEMI ARTHROPLASTY ;  Surgeon: Rozanna Box, MD;  Location: [facility];  Service: Orthopedics;  Laterality: Left;  . left wrist surgery surgery  7 yrs ago  . LEG SURGERY Left   .  LITHOTRIPSY    . MASTECTOMY W/ SENTINEL NODE BIOPSY Bilateral 01/20/2016   Procedure: BILATERAL TOTAL MASTECTOMY WITH RIGHT SENTINEL LYMPH NODE BIOPSY;  Surgeon: Rolm Bookbinder, MD;  Location: [facility];  Service: General;  Laterality: Bilateral;  . WRIST SURGERY     left wrist    OB History    No data available       Home Medications    Prior to Admission medications   Medication Sig Start Date End Date Taking? Authorizing Provider  amLODipine (NORVASC) 5 MG tablet Take 5 mg by mouth daily.   Yes [provider]  atenolol (TENORMIN) 25 MG tablet Take 1 tablet (25 mg total)  by mouth daily. 06/06/14  Yes Hongalgi, Lenis Dickinson, MD  cholecalciferol (VITAMIN D) 1000 UNITS tablet Take 1,000 Units by mouth once a week.    Yes [provider]  ibuprofen (ADVIL) 200 MG tablet Take 400 mg by mouth every 6 (six) hours as needed for fever, headache or mild pain. Reported on 01/12/2016   Yes [provider]  anastrozole (ARIMIDEX) 1 MG tablet Take 1 tablet (1 mg total) by mouth daily. Patient not taking: Reported on 04/10/2017 03/06/16   Magrinat, Virgie Dad, MD  aspirin EC 325 MG tablet Take 1 tablet (325 mg total) by mouth daily. Patient not taking: Reported on 04/10/2017 06/06/14   Modena Jansky, MD  cephALEXin (KEFLEX) 500 MG capsule Take 1 capsule (500 mg total) by mouth 2 (two) times daily. 04/10/17   Quintella Reichert, MD  folic acid (FOLVITE) 1 MG tablet Take 1 tablet (1 mg total) by mouth daily. Patient not taking: Reported on 04/10/2017 06/06/14   Modena Jansky, MD  HYDROcodone-acetaminophen (NORCO/VICODIN) 5-325 MG tablet Take 1 tablet by mouth every 8 (eight) hours as needed. 04/10/17   Quintella Reichert, MD  lactose free nutrition (BOOST PLUS) LIQD Take 237 mLs by mouth 2 (two) times daily between meals. Patient not taking: Reported on 04/10/2017 06/06/14   Modena Jansky, MD  methocarbamol (ROBAXIN) 500 MG tablet Take 1 tablet (500 mg total) by mouth every 6 (six) hours as needed for muscle spasms. Patient not taking: Reported on 04/10/2017 01/22/16   Rolm Bookbinder, MD  simvastatin (ZOCOR) 20 MG tablet Take 1 tablet (20 mg total) by mouth daily at 6 PM. Patient not taking: Reported on 04/10/2017 06/06/14   Modena Jansky, MD  thiamine 100 MG tablet Take 1 tablet (100 mg total) by mouth daily. Patient not taking: Reported on 04/10/2017 06/06/14   Modena Jansky, MD    Family History Family History  Problem Relation Age of Onset  . Stroke Mother     Social History Social History  Substance Use Topics  . Smoking status: Never Smoker  .  Smokeless tobacco: Current User    Types: Chew  . Alcohol use Yes     Comment: daily -drinks a quart of beer     Allergies   Ambien [zolpidem tartrate]   Review of Systems Review of Systems  Cardiovascular: Positive for syncope.  All other systems reviewed and are negative.    Physical Exam Updated Vital Signs BP (!) 148/108   Pulse 72   Temp 97.6 F (36.4 C) (Oral)   Resp 17   SpO2 97%   Physical Exam  Constitutional: She is oriented to person, place, and time. She appears well-developed and well-nourished.  HENT:  Head: Normocephalic.  Abrasion to nasal bridge, abrasion to chin  Eyes: Pupils are equal, round,  and reactive to light.  Cardiovascular: Normal rate and regular rhythm.   No murmur heard. Pulmonary/Chest: Effort normal and breath sounds normal. No respiratory distress.  Right-sided chest wall tenderness without any crepitance  Abdominal: Soft. There is no tenderness. There is no rebound and no guarding.  Musculoskeletal:  2+ DP pulses bilaterally. There is tenderness and swelling to the right wrist.  Abrasion over the right knee with local tenderness over the right knee. There is pain with range of motion of the right hip and the right knee.  Neurological: She is alert and oriented to person, place, and time.  5 out of 5 strength in all 4 extremities  Skin: Skin is warm and dry.  Psychiatric: She has a normal mood and affect. Her behavior is normal.  Nursing note and vitals reviewed.    ED Treatments / Results  Labs (all labs ordered are listed, but only abnormal results are displayed) Labs Reviewed  BASIC METABOLIC PANEL - Abnormal; Notable for the following:       Result Value   Sodium 133 (*)    Chloride 99 (*)    Glucose, Bld 119 (*)    All other components within normal limits  CBC - Abnormal; Notable for the following:    Hemoglobin 16.5 (*)    HCT 48.4 (*)    All other components within normal limits  URINALYSIS, ROUTINE W REFLEX  MICROSCOPIC - Abnormal; Notable for the following:    APPearance HAZY (*)    Hgb urine dipstick SMALL (*)    Leukocytes, UA LARGE (*)    Bacteria, UA MANY (*)    Squamous Epithelial / LPF 0-5 (*)    All other components within normal limits  GLUCOSE, CAPILLARY - Abnormal; Notable for the following:    Glucose-Capillary 105 (*)    All other components within normal limits  URINE CULTURE  CBG MONITORING, ED  I-STAT TROPONIN, ED  POCT I-STAT TROPONIN I    EKG  EKG Interpretation  Date/Time:  Tuesday April 10 2017 10:46:31 EDT Ventricular Rate:  82 PR Interval:    QRS Duration: 132 QT Interval:  401 QTC Calculation: 469 R Axis:   74 Text Interpretation:  Sinus rhythm Right bundle branch block Confirmed by Quintella Reichert [phone]) on 04/10/2017 4:46:07 PM       Radiology Dg Chest 2 View  Result Date: 04/10/2017 CLINICAL DATA:  Syncope and fall. Right sided chest wall tenderness. EXAM: CHEST  2 VIEW COMPARISON:  Chest x-ray dated September 11, 2014. FINDINGS: The cardiomediastinal silhouette is normal in size. Normal pulmonary vascularity. Atherosclerotic calcification of the aortic arch. No focal consolidation, pleural effusion, or pneumothorax. Possible nondisplaced fractures of the right anterolateral seventh and eighth ribs. IMPRESSION: Possible nondisplaced fractures of the right anterolateral seventh and eighth ribs. No active cardiopulmonary disease. Electronically Signed   By: Titus Dubin M.D.   On: 04/10/2017 15:32   Dg Shoulder Right  Result Date: 04/10/2017 CLINICAL DATA:  Syncopal episode. The patient reports shoulder pain. EXAM: RIGHT SHOULDER - 2+ VIEW COMPARISON:  Chest x-ray of September 11, 2014 which included a portion of the right shoulder. FINDINGS: The bones are subjectively mildly osteopenic. There is no lytic nor blastic lesion. No acute fracture nor dislocation is observed. There is mild narrowing of the glenohumeral joint space. There is moderate narrowing of  the AC joint space. The subacromial subdeltoid space is normal. The right clavicle is intact. IMPRESSION: There is no acute bony abnormality of the right shoulder.  Mild age-appropriate degenerative changes are observed. Electronically Signed   By: David  Martinique M.D.   On: 04/10/2017 12:04   Dg Elbow Complete Right  Result Date: 04/10/2017 CLINICAL DATA:  Syncopal episode yesterday. Awakened with left elbow pain. EXAM: RIGHT ELBOW - COMPLETE 3+ VIEW COMPARISON:  None in PACs FINDINGS: The bones of the elbow are subjectively mildly osteopenic. The radial head is intact. The a olecranon is normal. The condylar and supracondylar region of the distal humerus are normal. There is no joint effusion. IMPRESSION: There is no acute or significant chronic bony abnormality of the right elbow. Electronically Signed   By: David  Martinique M.D.   On: 04/10/2017 12:02   Dg Wrist Complete Right  Result Date: 04/10/2017 CLINICAL DATA:  Status post syncopal episode yesterday. The patient porch right hand and wrist pain. EXAM: RIGHT WRIST - COMPLETE 3+ VIEW COMPARISON:  Right hand series of today's date FINDINGS: The bones are subjectively osteopenic. The distal radius and ulna are intact. The radiocarpal, intercarpal, and carpometacarpal joint spaces are reasonably well maintained for age. Specific attention to the scaphoid reveals no acute abnormality. There is a small erosion involving the base of the fifth metacarpal. IMPRESSION: There is no acute fracture nor dislocation of the bones of the right wrist. Electronically Signed   By: David  Martinique M.D.   On: 04/10/2017 12:00   Ct Head Wo Contrast  Result Date: 04/10/2017 CLINICAL DATA:  Syncopal episode yesterday status post fall. EXAM: CT HEAD WITHOUT CONTRAST TECHNIQUE: Contiguous axial images were obtained from the base of the skull through the vertex without intravenous contrast. COMPARISON:  Head CT September 11, 2014 FINDINGS: Brain: There is no midline shift  hydrocephalus or mass. No acute hemorrhage or acute transcortical infarct is identified. There is chronic diffuse atrophy. Chronic bilateral periventricular white matter small vessel ischemic change is identified. 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 Vascular: No hyperdense vessel or unexpected calcification. Skull: Normal. Negative for fracture or focal lesion. There is nasal septal deviation from right to left unchanged. Sinuses/Orbits: No acute finding. Other: None. IMPRESSION: No focal acute intracranial abnormality identified. Chronic diffuse atrophy. Chronic bilateral periventricular white matter small vessel ischemic change. Electronically Signed   By: Abelardo Diesel M.D.   On: 04/10/2017 16:04   Ct Knee Right Wo Contrast  Result Date: 04/10/2017 CLINICAL DATA:  Syncopal episode. Patient fell and struck face, knee and wrist. Pain. Unable to weightbear. EXAM: CT OF THE  KNEE WITHOUT CONTRAST TECHNIQUE: Multidetector CT imaging of the knee was performed according to the standard protocol. Multiplanar CT image reconstructions were also generated. COMPARISON:  None. FINDINGS: Bones/Joint/Cartilage No acute fracture or suspicious osseous lesions. No joint dislocations. Mild femorotibial joint space narrowing consistent with osteoarthritis. Ligaments Suboptimally assessed by CT. Muscles and Tendons No muscle atrophy, intramuscular hemorrhage or mass. Intact appearing extensor mechanism tendons. Soft tissues No significant soft tissue swelling.  Trace joint effusion. IMPRESSION: 1. No fracture or malalignment of right knee. 2. Osteoarthritis of the femorotibial compartment with slight joint space narrowing. 3. Trace fluid in the knee joint likely physiologic. Electronically Signed   By: Ashley Royalty M.D.   On: 04/10/2017 17:27   Ct Hip Right Wo Contrast  Result Date: 04/10/2017 CLINICAL DATA:  Syncopal episode yesterday. Fell and injured right hip. EXAM: CT OF THE RIGHT HIP  WITHOUT CONTRAST TECHNIQUE: Multidetector CT imaging of the right hip was performed according to the standard protocol. Multiplanar CT image reconstructions  were also generated. COMPARISON:  Radiographs 04/02/2017 FINDINGS: The right hip is normally located. No definite hip fracture. Moderate hip joint degenerative changes. The visualized right hemipelvis is intact. No pubic rami fractures. The pubic symphysis is intact. The surrounding right hip and pelvic musculature appear grossly normal. No obvious hematoma. No significant intrapelvic abnormalities. IMPRESSION: No definite hip fracture. If symptoms persist MRI is recommended for further evaluation. Intact right hemipelvis. No obvious muscle injury. Electronically Signed   By: Marijo Sanes M.D.   On: 04/10/2017 17:23   Dg Knee Complete 4 Views Right  Result Date: 04/10/2017 CLINICAL DATA:  Syncopal episode yesterday. Patient reports right knee pain. EXAM: RIGHT KNEE - COMPLETE 4+ VIEW COMPARISON:  None in PACs FINDINGS: The bones are subjectively adequately mineralized. There is no acute fracture or dislocation. There may be a small suprapatellar effusion. There is no significant joint space loss. IMPRESSION: Probable small suprapatellar effusion. No acute fracture nor dislocation. Electronically Signed   By: David  Martinique M.D.   On: 04/10/2017 12:01   Dg Hand Complete Right  Result Date: 04/10/2017 CLINICAL DATA:  Syncopal episode yesterday. Right hand and wrist pain. Chronic flexion contracture of the fifth finger at the PIP joint. EXAM: RIGHT HAND - COMPLETE 3+ VIEW COMPARISON:  No previous studies available in [facility] FINDINGS: The bones of the right hand are subjectively osteopenic. The fixed flexion contracture at the PIP joint of the fifth finger is observed. No acute fracture is demonstrated. The interphalangeal, MCP, and CMC joint spaces are reasonably well-maintained. There is an erosion at the base of the fifth metacarpal. IMPRESSION: There is no  acute bony abnormality of the right hand. Electronically Signed   By: David  Martinique M.D.   On: 04/10/2017 11:59   Dg Hip Unilat  With Pelvis 2-3 Views Right  Result Date: 04/10/2017 CLINICAL DATA:  Syncopal episode.  Fall. EXAM: DG HIP (WITH OR WITHOUT PELVIS) 2-3V RIGHT COMPARISON:  CT 08/17/2004. FINDINGS: Diffuse osteopenia and degenerative change. No acute bony abnormality identified. Left hip replacement. Hardware intact. IMPRESSION: No acute bony abnormality identified. Electronically Signed   By: Marcello Moores  Register   On: 04/10/2017 11:57    Procedures Procedures (including critical care time)  Medications Ordered in ED Medications  cephALEXin (KEFLEX) capsule 500 mg (not administered)  HYDROcodone-acetaminophen (NORCO/VICODIN) 5-325 MG per tablet 1 tablet (1 tablet Oral Given 04/10/17 1518)     Initial Impression / Assessment and Plan / ED Course  I have reviewed the triage vital signs and the nursing notes.  Pertinent labs & imaging results that were available during my care of the patient were reviewed by me and considered in my medical decision making (see chart for details).     Patient here for evaluation of injuries from a fall yesterday. She does have multiple areas of tenderness. Chest x-ray demonstrates 2 nondisplaced rib fractures. She has no respiratory distress or splinting in the emergency department. She does have significant pain on weightbearing to the right lower extremity, will check CT scan to rule out occult fracture.   CT hip and knee are negative for fractures. Patient states she does not feel comfortable going home secondary to pain and mobility issues. UA is consistent with UTI, we will treat with antibiotics. Hospitalist consulted for observation Final Clinical Impressions(s) / ED Diagnoses   Final diagnoses:  Fall, initial encounter  Closed fracture of multiple ribs of right side, initial encounter  Contusion of right hip, initial encounter  Contusion  of right knee,  initial encounter  Acute UTI    New Prescriptions New Prescriptions   CEPHALEXIN (KEFLEX) 500 MG CAPSULE    Take 1 capsule (500 mg total) by mouth 2 (two) times daily.   HYDROCODONE-ACETAMINOPHEN (NORCO/VICODIN) 5-325 MG TABLET    Take 1 tablet by mouth every 8 (eight) hours as needed.     Quintella Reichert, MD 04/10/17 [phone]

## 2017-04-10 NOTE — ED Notes (Signed)
Hospitalist at bedside 

## 2017-04-10 NOTE — ED Notes (Signed)
Attempted to ambulate PT to bathroom, PT stated that "doctor already tried to walk me to the door and I was in too much pain to get up". RN aware.

## 2017-04-10 NOTE — H&P (Signed)
[facility] History and Physical  [name] PJA:[id number] DOB: August 01, 1941 DOA: 04/10/2017   PCP: Josetta Huddle, MD  Specialists: Followed by oncology for breast cancer. Followed by [facility] gastroenterology for history of esophageal stricture.  Chief Complaint: Fall, possible passing out episode  HPI: [name] is a 76 y.o. female with a past medical history of hypertension, history of paroxysmal atrial fibrillation, not on anticoagulation, history of alcohol dependence who was in her usual state of health till yesterday when she was walking out of her church when she fell. She fell on her right side. She does not fully remember the event. She thinks she may have passed out, but is not sure. Her son is at the bedside and he wasn't there when this occurred. However, she denied any chest pain, shortness of breath, dizziness, lightheadedness prior to episode. She was helped to her feet by a friend. She was able to walk to the car. However, she had severe pain on her right side including her right chest, arm and leg. She could not sleep overnight as a result of this pain. So she decided to come into the hospital today for further evaluation. She has had increased urinary frequency over the last few days. Denies any fever, chills. She did have an episode of incontinence with this event. No mention of seizure type activity. Last alcohol consumption was 3 days ago. Denies any headaches.  In the emergency department evaluation reveals abnormal, UA suggesting UTI. Imaging studies showed nondisplaced fracture of the right seventh and eighth ribs. She will need hospitalization for further workup.  Home Medications: Prior to Admission medications   Medication Sig Start Date End Date Taking? Authorizing Provider  amLODipine (NORVASC) 5 MG tablet Take 5 mg by mouth daily.   Yes [provider]  atenolol (TENORMIN) 25 MG tablet Take 1 tablet (25 mg total) by mouth daily. 06/06/14  Yes  Hongalgi, Lenis Dickinson, MD  cholecalciferol (VITAMIN D) 1000 UNITS tablet Take 1,000 Units by mouth once a week.    Yes [provider]  ibuprofen (ADVIL) 200 MG tablet Take 400 mg by mouth every 6 (six) hours as needed for fever, headache or mild pain. Reported on 01/12/2016   Yes [provider]  anastrozole (ARIMIDEX) 1 MG tablet Take 1 tablet (1 mg total) by mouth daily. Patient not taking: Reported on 04/10/2017 03/06/16   Magrinat, Virgie Dad, MD  aspirin EC 325 MG tablet Take 1 tablet (325 mg total) by mouth daily. Patient not taking: Reported on 04/10/2017 06/06/14   Modena Jansky, MD  cephALEXin (KEFLEX) 500 MG capsule Take 1 capsule (500 mg total) by mouth 2 (two) times daily. 04/10/17   Quintella Reichert, MD  folic acid (FOLVITE) 1 MG tablet Take 1 tablet (1 mg total) by mouth daily. Patient not taking: Reported on 04/10/2017 06/06/14   Modena Jansky, MD  HYDROcodone-acetaminophen (NORCO/VICODIN) 5-325 MG tablet Take 1 tablet by mouth every 8 (eight) hours as needed. 04/10/17   Quintella Reichert, MD  methocarbamol (ROBAXIN) 500 MG tablet Take 1 tablet (500 mg total) by mouth every 6 (six) hours as needed for muscle spasms. Patient not taking: Reported on 04/10/2017 01/22/16   Rolm Bookbinder, MD  simvastatin (ZOCOR) 20 MG tablet Take 1 tablet (20 mg total) by mouth daily at 6 PM. Patient not taking: Reported on 04/10/2017 06/06/14   Modena Jansky, MD  thiamine 100 MG tablet Take 1 tablet (100 mg total) by mouth daily. Patient not  taking: Reported on 04/10/2017 06/06/14   Modena Jansky, MD    Allergies:  Allergies  Allergen Reactions  . Ambien [Zolpidem Tartrate] Other (See Comments)    Causes nightmares    Past Medical History: Past Medical History:  Diagnosis Date  . Alcohol abuse   . Anemia   . Arthritis   . Bacterial vaginosis   . Cancer ([place]) 01/2015   Breast cancer right   . Cerebrovascular accident (stroke) ([place]) 2/07   left frontal lobe  . Closed  left hip fracture ([place]) 5/14  . Complication of anesthesia    woke up too early  with kidney stone surgery and wrist surgery  . Depression   . Dupuytren's contracture   . Fibrocystic breast disease   . Fracture of metatarsal bone of right foot    5th metatarsal  . GERD (gastroesophageal reflux disease)   . Gout   . Headache   . Hyperlipidemia LDL goal <70   . Hypertension   . Paroxysmal A-fib ([place])   . Renal calculus   . Vitamin B12 deficiency   . Vitamin D deficiency     Past Surgical History:  Procedure Laterality Date  . ABDOMINAL HYSTERECTOMY    . cataracts Bilateral   . ESOPHAGOGASTRODUODENOSCOPY (EGD) WITH PROPOFOL N/A 05/02/2016   Procedure: ESOPHAGOGASTRODUODENOSCOPY (EGD) WITH PROPOFOL;  Surgeon: Garlan Fair, MD;  Location: [facility];  Service: Endoscopy;  Laterality: N/A;  . HIP ARTHROPLASTY Left 12/17/2012   Procedure: LEFT HIP HEMI ARTHROPLASTY ;  Surgeon: Rozanna Box, MD;  Location: [facility];  Service: Orthopedics;  Laterality: Left;  . left wrist surgery surgery  7 yrs ago  . LEG SURGERY Left   . LITHOTRIPSY    . MASTECTOMY W/ SENTINEL NODE BIOPSY Bilateral 01/20/2016   Procedure: BILATERAL TOTAL MASTECTOMY WITH RIGHT SENTINEL LYMPH NODE BIOPSY;  Surgeon: Rolm Bookbinder, MD;  Location: [facility];  Service: General;  Laterality: Bilateral;  . WRIST SURGERY     left wrist    Social History: She lives with her son. Usually independent with daily activities. Does not smoke cigarettes. No recreational drug use. Does drink 1 quart of beer every day and has been doing so for the last 10-15 years. Last consumption was on Saturday.   Family History:  Family History  Problem Relation Age of Onset  . Stroke Mother      Review of Systems - History obtained from the patient General ROS: positive for  - fatigue Psychological ROS: negative Ophthalmic ROS: negative ENT ROS: negative Allergy and Immunology ROS: negative Hematological and Lymphatic ROS:  negative Endocrine ROS: negative Respiratory ROS: no cough, shortness of breath, or wheezing Cardiovascular ROS: no chest pain or dyspnea on exertion Gastrointestinal ROS: no abdominal pain, change in bowel habits, or black or bloody stools Genito-Urinary ROS: as in hpi Musculoskeletal ROS: as in hpi Neurological ROS: no TIA or stroke symptoms Dermatological ROS: negative  Physical Examination  Vitals:   04/10/17 1430 04/10/17 1530 04/10/17 1600 04/10/17 1752  BP: (!) 160/92 (!) 167/98 (!) 148/108 126/88  Pulse: 71 65 72 72  Resp:  15 17 16   Temp:      TempSrc:      SpO2: 96% 90% 97% 98%    BP 126/88 (BP Location: Left Arm)   Pulse 72   Temp 97.6 F (36.4 C) (Oral)   Resp 16   SpO2 98%   General appearance: alert, cooperative, appears stated age and no distress Head: Normocephalic, without obvious  abnormality, Abrasion noted on her chin as well as on her nose Eyes: conjunctivae/corneas clear. PERRL, EOM's intact.  Throat: lips, mucosa, and tongue normal; teeth and gums normal Neck: no adenopathy, no carotid bruit, no JVD, supple, symmetrical, trachea midline and thyroid not enlarged, symmetric, no tenderness/mass/nodules Resp: clear to auscultation bilaterally Chest wall: right sided chest wall tenderness Cardio: regular rate and rhythm, S1, S2 normal, no murmur, click, rub or gallop GI: soft, non-tender; bowel sounds normal; no masses,  no organomegaly Extremities: Limited range of motion of the right lower extremity at the, and hip. Mainly due to pain. Able to move her right upper extremity without any difficulty. Pulses: 2+ and symmetric Skin: Abrasions noted over lower extremity Lymph nodes: Cervical, supraclavicular, and axillary nodes normal. Neurologic: She is awake, alert. Oriented to person, place, time. No facial asymmetry. Tongue is midline. Motor strength. Evaluation of the right side limited due to pain from fall. No obvious focal neurological  deficits.   Labs on Admission: I have personally reviewed following labs and imaging studies  CBC:  Recent Labs Lab 04/10/17 1411  WBC 10.5  HGB 16.5*  HCT 48.4*  MCV 95.8  PLT 237   Basic Metabolic Panel:  Recent Labs Lab 04/10/17 1411  NA 133*  K 4.4  CL 99*  CO2 24  GLUCOSE 119*  BUN 6  CREATININE 0.67  CALCIUM 8.9   CBG:  Recent Labs Lab 04/10/17 1357  GLUCAP 105*    Radiological Exams on Admission: Dg Chest 2 View  Result Date: 04/10/2017 CLINICAL DATA:  Syncope and fall. Right sided chest wall tenderness. EXAM: CHEST  2 VIEW COMPARISON:  Chest x-ray dated September 11, 2014. FINDINGS: The cardiomediastinal silhouette is normal in size. Normal pulmonary vascularity. Atherosclerotic calcification of the aortic arch. No focal consolidation, pleural effusion, or pneumothorax. Possible nondisplaced fractures of the right anterolateral seventh and eighth ribs. IMPRESSION: Possible nondisplaced fractures of the right anterolateral seventh and eighth ribs. No active cardiopulmonary disease. Electronically Signed   By: Titus Dubin M.D.   On: 04/10/2017 15:32   Dg Shoulder Right  Result Date: 04/10/2017 CLINICAL DATA:  Syncopal episode. The patient reports shoulder pain. EXAM: RIGHT SHOULDER - 2+ VIEW COMPARISON:  Chest x-ray of September 11, 2014 which included a portion of the right shoulder. FINDINGS: The bones are subjectively mildly osteopenic. There is no lytic nor blastic lesion. No acute fracture nor dislocation is observed. There is mild narrowing of the glenohumeral joint space. There is moderate narrowing of the AC joint space. The subacromial subdeltoid space is normal. The right clavicle is intact. IMPRESSION: There is no acute bony abnormality of the right shoulder. Mild age-appropriate degenerative changes are observed. Electronically Signed   By: David  Martinique M.D.   On: 04/10/2017 12:04   Dg Elbow Complete Right  Result Date: 04/10/2017 CLINICAL DATA:   Syncopal episode yesterday. Awakened with left elbow pain. EXAM: RIGHT ELBOW - COMPLETE 3+ VIEW COMPARISON:  None in PACs FINDINGS: The bones of the elbow are subjectively mildly osteopenic. The radial head is intact. The a olecranon is normal. The condylar and supracondylar region of the distal humerus are normal. There is no joint effusion. IMPRESSION: There is no acute or significant chronic bony abnormality of the right elbow. Electronically Signed   By: David  Martinique M.D.   On: 04/10/2017 12:02   Dg Wrist Complete Right  Result Date: 04/10/2017 CLINICAL DATA:  Status post syncopal episode yesterday. The patient porch right hand and wrist pain.  EXAM: RIGHT WRIST - COMPLETE 3+ VIEW COMPARISON:  Right hand series of today's date FINDINGS: The bones are subjectively osteopenic. The distal radius and ulna are intact. The radiocarpal, intercarpal, and carpometacarpal joint spaces are reasonably well maintained for age. Specific attention to the scaphoid reveals no acute abnormality. There is a small erosion involving the base of the fifth metacarpal. IMPRESSION: There is no acute fracture nor dislocation of the bones of the right wrist. Electronically Signed   By: David  Martinique M.D.   On: 04/10/2017 12:00   Ct Head Wo Contrast  Result Date: 04/10/2017 CLINICAL DATA:  Syncopal episode yesterday status post fall. EXAM: CT HEAD WITHOUT CONTRAST TECHNIQUE: Contiguous axial images were obtained from the base of the skull through the vertex without intravenous contrast. COMPARISON:  Head CT September 11, 2014 FINDINGS: Brain: There is no midline shift hydrocephalus or mass. No acute hemorrhage or acute transcortical infarct is identified. There is chronic diffuse atrophy. Chronic bilateral periventricular white matter small vessel ischemic change is identified. 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 Vascular: No hyperdense vessel or unexpected calcification. Skull: Normal. Negative  for fracture or focal lesion. There is nasal septal deviation from right to left unchanged. Sinuses/Orbits: No acute finding. Other: None. IMPRESSION: No focal acute intracranial abnormality identified. Chronic diffuse atrophy. Chronic bilateral periventricular white matter small vessel ischemic change. Electronically Signed   By: Abelardo Diesel M.D.   On: 04/10/2017 16:04   Ct Knee Right Wo Contrast  Result Date: 04/10/2017 CLINICAL DATA:  Syncopal episode. Patient fell and struck face, knee and wrist. Pain. Unable to weightbear. EXAM: CT OF THE  KNEE WITHOUT CONTRAST TECHNIQUE: Multidetector CT imaging of the knee was performed according to the standard protocol. Multiplanar CT image reconstructions were also generated. COMPARISON:  None. FINDINGS: Bones/Joint/Cartilage No acute fracture or suspicious osseous lesions. No joint dislocations. Mild femorotibial joint space narrowing consistent with osteoarthritis. Ligaments Suboptimally assessed by CT. Muscles and Tendons No muscle atrophy, intramuscular hemorrhage or mass. Intact appearing extensor mechanism tendons. Soft tissues No significant soft tissue swelling.  Trace joint effusion. IMPRESSION: 1. No fracture or malalignment of right knee. 2. Osteoarthritis of the femorotibial compartment with slight joint space narrowing. 3. Trace fluid in the knee joint likely physiologic. Electronically Signed   By: Ashley Royalty M.D.   On: 04/10/2017 17:27   Ct Hip Right Wo Contrast  Result Date: 04/10/2017 CLINICAL DATA:  Syncopal episode yesterday. Fell and injured right hip. EXAM: CT OF THE RIGHT HIP WITHOUT CONTRAST TECHNIQUE: Multidetector CT imaging of the right hip was performed according to the standard protocol. Multiplanar CT image reconstructions were also generated. COMPARISON:  Radiographs 04/02/2017 FINDINGS: The right hip is normally located. No definite hip fracture. Moderate hip joint degenerative changes. The visualized right hemipelvis is intact. No  pubic rami fractures. The pubic symphysis is intact. The surrounding right hip and pelvic musculature appear grossly normal. No obvious hematoma. No significant intrapelvic abnormalities. IMPRESSION: No definite hip fracture. If symptoms persist MRI is recommended for further evaluation. Intact right hemipelvis. No obvious muscle injury. Electronically Signed   By: Marijo Sanes M.D.   On: 04/10/2017 17:23   Dg Knee Complete 4 Views Right  Result Date: 04/10/2017 CLINICAL DATA:  Syncopal episode yesterday. Patient reports right knee pain. EXAM: RIGHT KNEE - COMPLETE 4+ VIEW COMPARISON:  None in PACs FINDINGS: The bones are subjectively adequately mineralized. There is no acute fracture or dislocation. There may be a  small suprapatellar effusion. There is no significant joint space loss. IMPRESSION: Probable small suprapatellar effusion. No acute fracture nor dislocation. Electronically Signed   By: David  Martinique M.D.   On: 04/10/2017 12:01   Dg Hand Complete Right  Result Date: 04/10/2017 CLINICAL DATA:  Syncopal episode yesterday. Right hand and wrist pain. Chronic flexion contracture of the fifth finger at the PIP joint. EXAM: RIGHT HAND - COMPLETE 3+ VIEW COMPARISON:  No previous studies available in [facility] FINDINGS: The bones of the right hand are subjectively osteopenic. The fixed flexion contracture at the PIP joint of the fifth finger is observed. No acute fracture is demonstrated. The interphalangeal, MCP, and CMC joint spaces are reasonably well-maintained. There is an erosion at the base of the fifth metacarpal. IMPRESSION: There is no acute bony abnormality of the right hand. Electronically Signed   By: David  Martinique M.D.   On: 04/10/2017 11:59   Dg Hip Unilat  With Pelvis 2-3 Views Right  Result Date: 04/10/2017 CLINICAL DATA:  Syncopal episode.  Fall. EXAM: DG HIP (WITH OR WITHOUT PELVIS) 2-3V RIGHT COMPARISON:  CT 08/17/2004. FINDINGS: Diffuse osteopenia and degenerative change. No acute bony  abnormality identified. Left hip replacement. Hardware intact. IMPRESSION: No acute bony abnormality identified. Electronically Signed   By: Marcello Moores  Register   On: 04/10/2017 11:57    My interpretation of Electrocardiogram: Sinus rhythm at 82 bpm. Right bundle branch block. Nonspecific T wave changes. No concerning ST changes. Somewhat similar to previous EKG.   Problem List  Active Problems:   Alcohol abuse   PAF (paroxysmal atrial fibrillation) (HCC)   Syncope   Breast cancer of upper-outer quadrant of right female breast ([place])   Fall   Acute lower UTI   Accelerated hypertension   Assessment: This is a 76 year old female with a past medical history as stated earlier, who presents after sustaining a fall yesterday and possibly a syncopal event. She does not have any focal neurological deficits. She does report incontinence of urine with this episode yesterday. Seizure is a possibility. Patient does have a history of paroxysmal atrial fibrillation but is not on anticoagulation due to her history of alcoholism. Currently EKG shows sinus rhythm.  Plan: #1 Presumed syncope resulting in fall: Patient will be observed in the hospital. She'll be placed on telemetry. We will order echocardiogram, EEG. Orthostatics. PT and OT evaluation. Treat her UTI.  #2 Urinary tract infection: Send urine for culture. Place on ceftriaxone.   #3 Right-sided seventh and eighth rib fractures: These are nondisplaced. Repeat x-ray tomorrow morning. Pain control.  #4 Right-sided pain: She has pain in her right chest, arm and lower extremity. All of this is secondary to fall. Apart from the rib fracture no other injuries have been noted on imaging studies. Pain control. PT and OT evaluation. She tells me that she is up-to-date on her tetanus vaccination.  #5 history of alcohol abuse: She will be placed on CIWA protocol. Thiamine, folic acid, multivitamins. Counseled.  #6 accelerated hypertension: Blood pressure  noted to be elevated, presumably due to pain. Not in any distress. She tells me she is compliant with her home medications. Will resume her home medication regimen. Hydralazine as needed.  #7 history of paroxysmal atrial fibrillation: Currently in sinus rhythm. Monitor on telemetry. Not on anticoagulation due to history of alcohol abuse.  #8 history of breast cancer status post bilateral mastectomy in 2017: Followed by Dr. Jana Hakim. She was placed on anastrozole in August 2017 and was supposed to follow-up  with her oncologist in March. It does not appear that she did so. Does not appear that she takes anastrozole anymore. This will need to be addressed, but can be done as an outpatient.   DVT Prophylaxis: SCDs Code Status: Full code Family Communication: Discussed with patient and her son  Consults called: None   Severity of Illness: The appropriate patient status for this patient is OBSERVATION. Observation status is judged to be reasonable and necessary in order to provide the required intensity of service to ensure the patient's safety. The patient's presenting symptoms, physical exam findings, and initial radiographic and laboratory data in the context of their medical condition is felt to place them at decreased risk for further clinical deterioration. Furthermore, it is anticipated that the patient will be medically stable for discharge from the hospital within 2 midnights of admission. The following factors support the patient status of observation.   " The patient's presenting symptoms include fall, possible syncope. " The physical exam findings include elevated blood pressure. " The initial radiographic and laboratory data are rib fracture.   Further management decisions will depend on results of further testing and patient's response to treatment.   [facility]  [facility] Pager [phone]  If 7PM-7AM, please contact night-coverage [email or web address] Password  [email or web address]  04/10/2017, 6:18 PM

## 2017-04-10 NOTE — ED Notes (Signed)
Attempted blood draw with no success.     

## 2017-04-11 ENCOUNTER — Encounter (HOSPITAL_COMMUNITY): Payer: Self-pay

## 2017-04-11 ENCOUNTER — Observation Stay (HOSPITAL_COMMUNITY): Payer: Medicare Other

## 2017-04-11 ENCOUNTER — Inpatient Hospital Stay (HOSPITAL_COMMUNITY): Payer: Medicare Other

## 2017-04-11 ENCOUNTER — Other Ambulatory Visit (HOSPITAL_COMMUNITY): Payer: Medicare Other

## 2017-04-11 DIAGNOSIS — Z888 Allergy status to other drugs, medicaments and biological substances status: Secondary | ICD-10-CM | POA: Diagnosis not present

## 2017-04-11 DIAGNOSIS — Z7982 Long term (current) use of aspirin: Secondary | ICD-10-CM | POA: Diagnosis not present

## 2017-04-11 DIAGNOSIS — Z8673 Personal history of transient ischemic attack (TIA), and cerebral infarction without residual deficits: Secondary | ICD-10-CM | POA: Diagnosis not present

## 2017-04-11 DIAGNOSIS — Y9222 Religious institution as the place of occurrence of the external cause: Secondary | ICD-10-CM | POA: Diagnosis not present

## 2017-04-11 DIAGNOSIS — I480 Paroxysmal atrial fibrillation: Secondary | ICD-10-CM | POA: Diagnosis present

## 2017-04-11 DIAGNOSIS — S7001XA Contusion of right hip, initial encounter: Secondary | ICD-10-CM | POA: Diagnosis present

## 2017-04-11 DIAGNOSIS — K219 Gastro-esophageal reflux disease without esophagitis: Secondary | ICD-10-CM | POA: Diagnosis present

## 2017-04-11 DIAGNOSIS — Z96642 Presence of left artificial hip joint: Secondary | ICD-10-CM | POA: Diagnosis present

## 2017-04-11 DIAGNOSIS — R32 Unspecified urinary incontinence: Secondary | ICD-10-CM | POA: Diagnosis present

## 2017-04-11 DIAGNOSIS — R55 Syncope and collapse: Secondary | ICD-10-CM

## 2017-04-11 DIAGNOSIS — I10 Essential (primary) hypertension: Secondary | ICD-10-CM

## 2017-04-11 DIAGNOSIS — S8001XA Contusion of right knee, initial encounter: Secondary | ICD-10-CM | POA: Diagnosis present

## 2017-04-11 DIAGNOSIS — E785 Hyperlipidemia, unspecified: Secondary | ICD-10-CM | POA: Diagnosis present

## 2017-04-11 DIAGNOSIS — W010XXA Fall on same level from slipping, tripping and stumbling without subsequent striking against object, initial encounter: Secondary | ICD-10-CM | POA: Diagnosis present

## 2017-04-11 DIAGNOSIS — F1010 Alcohol abuse, uncomplicated: Secondary | ICD-10-CM | POA: Diagnosis present

## 2017-04-11 DIAGNOSIS — E538 Deficiency of other specified B group vitamins: Secondary | ICD-10-CM | POA: Diagnosis present

## 2017-04-11 DIAGNOSIS — E559 Vitamin D deficiency, unspecified: Secondary | ICD-10-CM | POA: Diagnosis present

## 2017-04-11 DIAGNOSIS — Z87442 Personal history of urinary calculi: Secondary | ICD-10-CM | POA: Diagnosis not present

## 2017-04-11 DIAGNOSIS — Z79899 Other long term (current) drug therapy: Secondary | ICD-10-CM | POA: Diagnosis not present

## 2017-04-11 DIAGNOSIS — Z823 Family history of stroke: Secondary | ICD-10-CM | POA: Diagnosis not present

## 2017-04-11 DIAGNOSIS — N6019 Diffuse cystic mastopathy of unspecified breast: Secondary | ICD-10-CM | POA: Diagnosis present

## 2017-04-11 DIAGNOSIS — C50411 Malignant neoplasm of upper-outer quadrant of right female breast: Secondary | ICD-10-CM | POA: Diagnosis present

## 2017-04-11 DIAGNOSIS — R350 Frequency of micturition: Secondary | ICD-10-CM | POA: Diagnosis present

## 2017-04-11 DIAGNOSIS — N390 Urinary tract infection, site not specified: Secondary | ICD-10-CM | POA: Diagnosis not present

## 2017-04-11 DIAGNOSIS — Z90710 Acquired absence of both cervix and uterus: Secondary | ICD-10-CM | POA: Diagnosis not present

## 2017-04-11 DIAGNOSIS — R0602 Shortness of breath: Secondary | ICD-10-CM | POA: Diagnosis not present

## 2017-04-11 LAB — CBC
HEMATOCRIT: 42.5 % (ref 36.0–46.0)
Hemoglobin: 14.2 g/dL (ref 12.0–15.0)
MCH: 32 pg (ref 26.0–34.0)
MCHC: 33.4 g/dL (ref 30.0–36.0)
MCV: 95.7 fL (ref 78.0–100.0)
Platelets: 233 10*3/uL (ref 150–400)
RBC: 4.44 MIL/uL (ref 3.87–5.11)
RDW: 12.5 % (ref 11.5–15.5)
WBC: 9.4 10*3/uL (ref 4.0–10.5)

## 2017-04-11 LAB — ECHOCARDIOGRAM COMPLETE
Height: 70 in
WEIGHTICAEL: 1975.32 [oz_av]

## 2017-04-11 LAB — BASIC METABOLIC PANEL
Anion gap: 8 (ref 5–15)
BUN: 10 mg/dL (ref 6–20)
CHLORIDE: 102 mmol/L (ref 101–111)
CO2: 24 mmol/L (ref 22–32)
CREATININE: 0.62 mg/dL (ref 0.44–1.00)
Calcium: 8.7 mg/dL — ABNORMAL LOW (ref 8.9–10.3)
GFR calc Af Amer: >60 mL/min (ref >60)
GFR calc non Af Amer: >60 mL/min (ref >60)
GLUCOSE: 121 mg/dL — AB (ref 65–99)
POTASSIUM: 4 mmol/L (ref 3.5–5.1)
Sodium: 134 mmol/L — ABNORMAL LOW (ref 135–145)

## 2017-04-11 NOTE — Care Management Note (Signed)
Case Management Note  Patient Details  Name: [name] MRN: [id number] Date of Birth: [birth year]  Subjective/Objective:  76 y/o f admitted w/Etoh abuse. Hx: etoh abuse. From home w/son. PT cons-await recc. CSW cons-etoh resources.                  Action/Plan:d/c plan home.   Expected Discharge Date:   (unknown)               Expected Discharge Plan:  Home/Self Care  In-House Referral:  Clinical Social Work  Discharge planning Services  CM Consult  Post Acute Care Choice:    Choice offered to:     DME Arranged:    DME Agency:     HH Arranged:    HH Agency:     Status of Service:  In process, will continue to follow  If discussed at Long Length of Stay Meetings, dates discussed:    Additional Comments:  Dessa Phi, RN 04/11/2017, 10:35 AM

## 2017-04-11 NOTE — Clinical Social Work Note (Addendum)
Clinical Social Work Assessment  Patient Details  Name: [name] MRN: [id number] Date of Birth: 27-Jan-1941  Date of referral:  04/11/17               Reason for consult:  Facility Placement, Substance Use/ETOH Abuse                Permission sought to share information with:  Facility Art therapist granted to share information::  Yes, Verbal Permission Granted  Name::        Agency::     Relationship::     Contact Information:     Housing/Transportation Living arrangements for the past 2 months:  Single Family Home Source of Information:  Patient Patient Interpreter Needed:  None Criminal Activity/Legal Involvement Pertinent to Current Situation/Hospitalization:    Significant Relationships:  None (Unknown) Lives with:  Self Do you feel safe going back to the place where you live?  Yes Need for family participation in patient care:  No (Coment)  Care giving concerns:  None listed by pt/family   Social Worker assessment / plan:  CSW met with pt and confirmed pt's plan to be discharged to either SNF to live at discharge or to a SNF and then a rehab facility for alcohol, abuse.  CSW provided active listening and validated pt's concerns.   CSW Dept was given permission to complete FL-2 and send referrals out to SNF facilities via the hub per pt's request.  Pt has been living independently prior to being admitted to [facility], but if able to D/C to a SNF would [facility], as she has been there twice and liked the experience.  Pt accepted  meeting schedules for area Alcoholics Anonymous and Narcotics Anonymous 12-Step meetings as well as inpatient/outpatient SA Tx resources.  CSW provided education to the pt as to the efficacy of 12-step programs for community support for those needing support in addition to or other than inpatient/outpatient treatment.  Pt appreciated CSW's efforts and thanked the CSW.  Employment status:  Retired Water engineer, [employer] PT Recommendations:  [place] / Referral to community resources:     Patient/Family's Response to care:  Patient alert and oriented.  Patient agreeable to plan.    Pt pleasant and appreciated CSW intervention.    Patient/Family's Understanding of and Emotional Response to Diagnosis, Current Treatment, and Prognosis:  Still assessing   Emotional Assessment Appearance:  Appears stated age Attitude/Demeanor/Rapport:    Affect (typically observed):  Accepting, Adaptable, Calm, Pleasant Orientation:  Oriented to Self, Oriented to Place, Oriented to  Time Alcohol / Substance use:  Alcohol Use Psych involvement (Current and /or in the community):     Discharge Needs  Concerns to be addressed:  No discharge needs identified Readmission within the last 30 days:  No Current discharge risk:  None Barriers to Discharge:  No Barriers Identified   Claudine Mouton, LCSWA 04/11/2017, 8:19 PM

## 2017-04-11 NOTE — Evaluation (Signed)
Occupational Therapy Evaluation Patient Details Name: [name] MRN: [id number] DOB: 1941/06/10 Today's Date: 04/11/2017    History of Present Illness 76 year old female with past medical history of hypertension, paroxysmal atrial fibrillation but not on anticoagulation secondary to history of alcohol dependence and risk of fall. Patient presented to ED status post fall on her right side. Patient does not fully remember the event. Patient last alcoholic beverage was about 3 days prior to the admission. On admission, patient was hemodynamically stable. Multiple plain films showed no acute fractures of the knee, hip, shoulder or elbow other than nondisplaced fracture of the right seventh and eighth ribs. Urinalysis was suggestive of urinary tract infection   Clinical Impression   This 76 y/o F presents with the above. At baseline Pt reports she is independent with ADLs and functional mobility. Pt currently requires MinA for functional mobility using RW, MaxA for LB ADLs with increased assist levels largely due to pain in R side. Pt lives with son who is available to assist intermittently. Pt will benefit from continued acute OT services and currently recommend Pt received additional OT services in SNF setting prior to return home to maximize Pt's safety and independence with ADLs and functional mobility.     Follow Up Recommendations  SNF;Supervision/Assistance - 24 hour (pending progress, Pt may be able to progress home with HHOT)    Equipment Recommendations  None recommended by OT           Precautions / Restrictions Precautions Precautions: Fall Restrictions Weight Bearing Restrictions: No      Mobility Bed Mobility Overal bed mobility: Needs Assistance Bed Mobility: Supine to Sit     Supine to sit: HOB elevated;Min guard     General bed mobility comments: close guard for safety, increased time   Transfers Overall transfer level: Needs assistance Equipment used:  Rolling walker (2 wheeled) Transfers: Sit to/from Stand Sit to Stand: Mod assist         General transfer comment: increased time and effort due to pain, assist to rise and steady at RW, verbal cues for hand placement     Balance Overall balance assessment: Needs assistance Sitting-balance support: Feet supported;Bilateral upper extremity supported Sitting balance-Leahy Scale: Fair Sitting balance - Comments: static sitting EOB   Standing balance support: Bilateral upper extremity supported;During functional activity Standing balance-Leahy Scale: Poor Standing balance comment: reliant on UE support during mobility; Pt able to stand to wash hands at sink with min steady assist from therapist                            ADL either performed or assessed with clinical judgement   ADL Overall ADL's : Needs assistance/impaired Eating/Feeding: Set up;Sitting   Grooming: Wash/dry hands;Minimal assistance;Standing   Upper Body Bathing: Sitting;Minimal assistance   Lower Body Bathing: Minimal assistance;Sit to/from stand   Upper Body Dressing : Minimal assistance;Sitting   Lower Body Dressing: Maximal assistance;Sit to/from stand   Toilet Transfer: Minimal assistance;Ambulation;RW;Regular Toilet;Grab bars   Toileting- Clothing Manipulation and Hygiene: Minimal assistance;Sit to/from stand Toileting - Clothing Manipulation Details (indicate cue type and reason): Min steady assist while Pt stands to complete perihygiene and clothing management      Functional mobility during ADLs: Minimal assistance;Rolling walker General ADL Comments: Pt completes room level functional mobility at RW level, toilet transfer, toileting, standing grooming ADLs during session  Pertinent Vitals/Pain Pain Assessment: Faces Faces Pain Scale: Hurts little more Pain Location: R side Pain Descriptors / Indicators: Aching;Sore Pain Intervention(s): Limited  activity within patient's tolerance;Monitored during session;Repositioned     Hand Dominance Right   Extremity/Trunk Assessment Upper Extremity Assessment Upper Extremity Assessment: RUE deficits/detail;LUE deficits/detail RUE Deficits / Details: R pinky finger contracted into flexion (residual); limited AROM due to pain RUE: Unable to fully assess due to pain LUE Deficits / Details: L pinky and ring finger contracted into flexion (residual); AROM is WFL, generalized weakness    Lower Extremity Assessment Lower Extremity Assessment: Defer to PT evaluation   Cervical / Trunk Assessment Cervical / Trunk Assessment: Other exceptions Cervical / Trunk Exceptions: Pt demonstrating kyphotic posture, suspect more so due to pain    Communication Communication Communication: No difficulties   Cognition Arousal/Alertness: Awake/alert Behavior During Therapy: WFL for tasks assessed/performed Overall Cognitive Status: Within Functional Limits for tasks assessed                                                      Home Living Family/patient expects to be discharged to:: Private residence Living Arrangements: Children (son) Available Help at Discharge: Friend(s);Family;Available PRN/intermittently Type of Home: Apartment Home Access: Stairs to enter Entrance Stairs-Number of Steps: 1   Home Layout: One level     Bathroom Shower/Tub: Teacher, early years/pre: Standard     Home Equipment: Environmental consultant - 4 wheels;Bedside commode          Prior Functioning/Environment Level of Independence: Independent        Comments: Pt was still driving         OT Problem List: Decreased strength;Impaired balance (sitting and/or standing);Decreased range of motion;Decreased activity tolerance;Decreased knowledge of use of DME or AE;Pain      OT Treatment/Interventions: Self-care/ADL training;DME and/or AE instruction;Therapeutic activities;Balance  training;Therapeutic exercise;Energy conservation;Patient/family education    OT Goals(Current goals can be found in the care plan section) Acute Rehab OT Goals Patient Stated Goal: regain independence, less pain  OT Goal Formulation: With patient Time For Goal Achievement: 04/25/17 Potential to Achieve Goals: Good  OT Frequency: Min 2X/week                             AM-PAC PT "6 Clicks" Daily Activity     Outcome Measure Help from another person eating meals?: None Help from another person taking care of personal grooming?: A Little Help from another person toileting, which includes using toliet, bedpan, or urinal?: A Lot Help from another person bathing (including washing, rinsing, drying)?: A Lot Help from another person to put on and taking off regular upper body clothing?: A Little Help from another person to put on and taking off regular lower body clothing?: A Lot 6 Click Score: 16   End of Session Equipment Utilized During Treatment: Gait belt;Rolling walker Nurse Communication: Mobility status  Activity Tolerance: Patient tolerated treatment well Patient left: in chair;with call bell/phone within reach;with chair alarm set  OT Visit Diagnosis: Unsteadiness on feet (R26.81);Muscle weakness (generalized) (M62.81);Pain Pain - Right/Left: Right Pain - part of body: Hip;Leg;Shoulder                Time: 1533-1601 OT Time Calculation (min): 28 min Charges:  OT General Charges $OT  Visit: 1 Visit OT Evaluation $OT Eval Low Complexity: 1 Low OT Treatments $Self Care/Home Management : 8-22 mins G-Codes:     Lou Cal, OT Pager [phone] 04/11/2017   Raymondo Band 04/11/2017, 4:18 PM

## 2017-04-11 NOTE — Progress Notes (Signed)
CSW attempted to assess patient, PT was in room working with patient. CSW will try again at a later time.  Abundio Miu, [place] Social Worker [facility] Cell#: [phone]

## 2017-04-11 NOTE — Progress Notes (Signed)
  Echocardiogram 2D Echocardiogram has been performed.  Jennette Dubin 04/11/2017, 3:26 PM

## 2017-04-11 NOTE — Evaluation (Signed)
Physical Therapy Evaluation Patient Details Name: [name] MRN: [id number] DOB: September 20, 1940 Today's Date: 04/11/2017   History of Present Illness  76 year old female with past medical history of hypertension, paroxysmal atrial fibrillation but not on anticoagulation secondary to history of alcohol dependence and risk of fall. Patient presented to ED status post fall on her right side. Patient does not fully remember the event. Patient last alcoholic beverage was about 3 days prior to the admission. On admission, patient was hemodynamically stable. Multiple plain films showed no acute fractures of the knee, hip, shoulder or elbow other than nondisplaced fracture of the right seventh and eighth ribs. Urinalysis was suggestive of urinary tract infection  Clinical Impression  The patient was returning to bed upon PT arrival. Declined to ambulate today. Will check back and assess a=gait and safety next visit. Pt admitted with above diagnosis. Pt currently with functional limitations due to the deficits listed below (see PT Problem List). Pt will benefit from skilled PT to increase their independence and safety with mobility to allow discharge to the venue listed below.       Follow Up Recommendations Home health PT;SNF (pending progress and caregivers)    Equipment Recommendations  None recommended by PT    Recommendations for Other Services       Precautions / Restrictions Precautions Precautions: Fall Restrictions Weight Bearing Restrictions: No      Mobility  Bed Mobility Overal bed mobility: Needs Assistance Bed Mobility: Sit to Supine     Supine to sit: HOB elevated;Min guard Sit to supine: Supervision   General bed mobility comments: no assistance required  Transfers Overall transfer level: Needs assistance Equipment used: 1 person hand held assist Transfers: Sit to/from Stand;Stand Pivot Transfers Sit to Stand: Mod assist         General transfer comment:  increased time and effort due to pain, assist to rise and steady, small steps to  walk to bed x 3 with olding onto bed rail  Ambulation/Gait             General Gait Details: tba  Stairs            Wheelchair Mobility    Modified Rankin (Stroke Patients Only)       Balance Overall balance assessment: Needs assistance Sitting-balance support: Feet supported;Bilateral upper extremity supported Sitting balance-Leahy Scale: Fair Sitting balance - Comments: static sitting EOB   Standing balance support: Single extremity supported Standing balance-Leahy Scale: Poor Standing balance comment: reliant on UE support during mobility; Pt able to stand to wash hands at sink with min steady assist from therapist                              Pertinent Vitals/Pain Pain Assessment: Faces Faces Pain Scale: Hurts little more Pain Location: R side Pain Descriptors / Indicators: Aching;Sore Pain Intervention(s): Monitored during session    Home Living Family/patient expects to be discharged to:: Private residence Living Arrangements: Children (son) Available Help at Discharge: Friend(s);Family;Available PRN/intermittently Type of Home: Apartment Home Access: Stairs to enter   Entrance Stairs-Number of Steps: 1 Home Layout: One level Home Equipment: Walker - 4 wheels;Bedside commode Additional Comments: states son is with patient    Prior Function Level of Independence: Independent         Comments: Pt was still driving      Hand Dominance   Dominant Hand: Right    Extremity/Trunk Assessment   Upper  Extremity Assessment Upper Extremity Assessment: Defer to OT evaluation RUE Deficits / Details: R pinky finger contracted into flexion (residual); limited AROM due to pain RUE: Unable to fully assess due to pain LUE Deficits / Details: L pinky and ring finger contracted into flexion (residual); AROM is WFL, generalized weakness     Lower Extremity  Assessment Lower Extremity Assessment: Generalized weakness    Cervical / Trunk Assessment Cervical / Trunk Assessment: Kyphotic Cervical / Trunk Exceptions: Pt demonstrating kyphotic posture, suspect more so due to pain   Communication   Communication: No difficulties  Cognition Arousal/Alertness: Awake/alert Behavior During Therapy: WFL for tasks assessed/performed Overall Cognitive Status: Within Functional Limits for tasks assessed                                        General Comments      Exercises     Assessment/Plan    PT Assessment Patient needs continued PT services  PT Problem List Decreased activity tolerance;Decreased mobility;Decreased knowledge of precautions;Decreased safety awareness;Decreased knowledge of use of DME;Pain       PT Treatment Interventions DME instruction;Gait training;Functional mobility training;Stair training;Patient/family education    PT Goals (Current goals can be found in the Care Plan section)  Acute Rehab PT Goals Patient Stated Goal: go home, back to church PT Goal Formulation: With patient Time For Goal Achievement: 04/25/17 Potential to Achieve Goals: Fair    Frequency Min 3X/week   Barriers to discharge Decreased caregiver support      Co-evaluation               AM-PAC PT "6 Clicks" Daily Activity  Outcome Measure Difficulty turning over in bed (including adjusting bedclothes, sheets and blankets)?: A Little Difficulty moving from lying on back to sitting on the side of the bed? : A Little Difficulty sitting down on and standing up from a chair with arms (e.g., wheelchair, bedside commode, etc,.)?: A Lot Help needed moving to and from a bed to chair (including a wheelchair)?: A Lot Help needed walking in hospital room?: Total Help needed climbing 3-5 steps with a railing? : Total 6 Click Score: 12    End of Session   Activity Tolerance: Patient tolerated treatment well Patient left: in  bed;with call bell/phone within reach;with bed alarm set Nurse Communication: Mobility status PT Visit Diagnosis: Difficulty in walking, not elsewhere classified (R26.2);Pain Pain - Right/Left: Right    Time: 6767-2094 PT Time Calculation (min) (ACUTE ONLY): 11 min   Charges:   PT Evaluation $PT Eval Low Complexity: 1 Low     PT G CodesTresa Endo PT [phone] ase here}  Claretha Cooper 04/11/2017, 4:47 PM

## 2017-04-11 NOTE — Progress Notes (Signed)
Patient ID: [name], female   DOB: 1940-09-18, 76 y.o.   MRN: [id number]  PROGRESS NOTE    [name]  RDE:[id number] DOB: 1941/05/22 DOA: 04/10/2017  PCP: Josetta Huddle, MD   Brief Narrative:  76 year old female with past medical history of hypertension, paroxysmal atrial fibrillation but not on anticoagulation secondary to history of alcohol dependence and risk of fall. Patient presented to ED status post fall on her right side. Patient does not fully remember the event. Patient last alcoholic beverage was about 3 days prior to the admission. On admission, patient was hemodynamically stable. Multiple plain films showed no acute fractures of the knee, hip, shoulder or elbow other than nondisplaced fracture of the right seventh and eighth ribs. Urinalysis was suggestive of urinary tract infection and she was started on empiric Rocephin while awaiting urine culture results.   Assessment & Plan:   Active Problems:   Mechanical fall / syncope and collapse - Secondary to history of alcohol abuse - No acute findings on CT head - No acute fractures on plain films other than the fractures of right seventh and eighth ribs - Obtain PT evaluation - 2-D echo is pending  History of alcohol abuse   Alcohol abuse - Alcohol level not obtained on the admission - Monitor for withdrawals - Continue folic acid, thiamine and multivitamin    PAF (paroxysmal atrial fibrillation) (HCC) - CHADS vasc score 3 - Not on anticoagulation secondary to risk of fall from alcohol abuse - Continue rate controlled with atenolol    Essential hypertension - Continue Norvasc and atenolol    Acute lower UTI - Continue Rocephin  - Follow up urine culture results    DVT prophylaxis: SCD's Code Status: full code  Family Communication: no family at the bedside Disposition Plan: awaiting PT eval, urine cx results    Consultants:   PT  Procedures:   ECHO - pending   Antimicrobials:   Rocephin  04/10/2017 -->   Subjective: Feel weak.  Objective: Vitals:   04/10/17 1600 04/10/17 1752 04/10/17 2006 04/11/17 0647  BP: (!) 148/108 126/88 138/90 137/83  Pulse: 72 72 78 61  Resp: 17 16 16 18   Temp:   98.4 F (36.9 C) 97.8 F (36.6 C)  TempSrc:   Oral Oral  SpO2: 97% 98% 100% 99%  Weight:   56 kg (123 lb 7.3 oz)   Height:   5\' 10"  (1.778 m)     Intake/Output Summary (Last 24 hours) at 04/11/17 0852 Last data filed at 04/11/17 0600  Gross per 24 hour  Intake            632.5 ml  Output                0 ml  Net            632.5 ml   Filed Weights   04/10/17 2006  Weight: 56 kg (123 lb 7.3 oz)    Examination:  General exam: Appears calm and comfortable  Respiratory system: Clear to auscultation. Respiratory effort normal. Cardiovascular system: S1 & S2 heard, RRR.  Gastrointestinal system: Abdomen is nondistended, soft and nontender. No organomegaly or masses felt. Normal bowel sounds heard. Central nervous system: Alert and oriented. No focal neurological deficits. Extremities: Symmetric 5 x 5 power. Skin: bruising on the upper nose Psychiatry: Judgement and insight appear normal. Mood & affect appropriate.   Data Reviewed: I have personally reviewed following labs and imaging studies  CBC:  Recent Labs  Lab 04/10/17 1411 04/11/17 0414  WBC 10.5 9.4  HGB 16.5* 14.2  HCT 48.4* 42.5  MCV 95.8 95.7  PLT 259 425   Basic Metabolic Panel:  Recent Labs Lab 04/10/17 1411 04/11/17 0414  NA 133* 134*  K 4.4 4.0  CL 99* 102  CO2 24 24  GLUCOSE 119* 121*  BUN 6 10  CREATININE 0.67 0.62  CALCIUM 8.9 8.7*   GFR: Estimated Creatinine Clearance: 52.9 mL/min (by C-G formula based on SCr of 0.62 mg/dL). Liver Function Tests: No results for input(s): AST, ALT, ALKPHOS, BILITOT, PROT, ALBUMIN in the last 168 hours. No results for input(s): LIPASE, AMYLASE in the last 168 hours. No results for input(s): AMMONIA in the last 168 hours. Coagulation Profile: No  results for input(s): INR, PROTIME in the last 168 hours. Cardiac Enzymes: No results for input(s): CKTOTAL, CKMB, CKMBINDEX, TROPONINI in the last 168 hours. BNP (last 3 results) No results for input(s): PROBNP in the last 8760 hours. HbA1C: No results for input(s): HGBA1C in the last 72 hours. CBG:  Recent Labs Lab 04/10/17 1357  GLUCAP 105*   Lipid Profile: No results for input(s): CHOL, HDL, LDLCALC, TRIG, CHOLHDL, LDLDIRECT in the last 72 hours. Thyroid Function Tests: No results for input(s): TSH, T4TOTAL, FREET4, T3FREE, THYROIDAB in the last 72 hours. Anemia Panel: No results for input(s): VITAMINB12, FOLATE, FERRITIN, TIBC, IRON, RETICCTPCT in the last 72 hours. Urine analysis:    Component Value Date/Time   COLORURINE YELLOW 04/10/2017 1647   APPEARANCEUR HAZY (A) 04/10/2017 1647   LABSPEC 1.008 04/10/2017 1647   PHURINE 5.0 04/10/2017 1647   GLUCOSEU NEGATIVE 04/10/2017 1647   HGBUR SMALL (A) 04/10/2017 1647   BILIRUBINUR NEGATIVE 04/10/2017 1647   KETONESUR NEGATIVE 04/10/2017 1647   PROTEINUR NEGATIVE 04/10/2017 1647   UROBILINOGEN 0.2 09/11/2014 2309   NITRITE NEGATIVE 04/10/2017 1647   LEUKOCYTESUR LARGE (A) 04/10/2017 1647   Sepsis Labs: @LABRCNTIP (procalcitonin:4,lacticidven:4)   )No results found for this or any previous visit (from the past 240 hour(s)).    Radiology Studies: Dg Chest 2 View  Result Date: 04/10/2017 CLINICAL DATA:  Syncope and fall. Right sided chest wall tenderness. EXAM: CHEST  2 VIEW COMPARISON:  Chest x-ray dated September 11, 2014. FINDINGS: The cardiomediastinal silhouette is normal in size. Normal pulmonary vascularity. Atherosclerotic calcification of the aortic arch. No focal consolidation, pleural effusion, or pneumothorax. Possible nondisplaced fractures of the right anterolateral seventh and eighth ribs. IMPRESSION: Possible nondisplaced fractures of the right anterolateral seventh and eighth ribs. No active cardiopulmonary  disease. Electronically Signed   By: Titus Dubin M.D.   On: 04/10/2017 15:32   Dg Shoulder Right  Result Date: 04/10/2017 CLINICAL DATA:  Syncopal episode. The patient reports shoulder pain. EXAM: RIGHT SHOULDER - 2+ VIEW COMPARISON:  Chest x-ray of September 11, 2014 which included a portion of the right shoulder. FINDINGS: The bones are subjectively mildly osteopenic. There is no lytic nor blastic lesion. No acute fracture nor dislocation is observed. There is mild narrowing of the glenohumeral joint space. There is moderate narrowing of the AC joint space. The subacromial subdeltoid space is normal. The right clavicle is intact. IMPRESSION: There is no acute bony abnormality of the right shoulder. Mild age-appropriate degenerative changes are observed. Electronically Signed   By: David  Martinique M.D.   On: 04/10/2017 12:04   Dg Elbow Complete Right  Result Date: 04/10/2017 CLINICAL DATA:  Syncopal episode yesterday. Awakened with left elbow pain. EXAM: RIGHT ELBOW - COMPLETE 3+ VIEW  COMPARISON:  None in PACs FINDINGS: The bones of the elbow are subjectively mildly osteopenic. The radial head is intact. The a olecranon is normal. The condylar and supracondylar region of the distal humerus are normal. There is no joint effusion. IMPRESSION: There is no acute or significant chronic bony abnormality of the right elbow. Electronically Signed   By: David  Martinique M.D.   On: 04/10/2017 12:02   Dg Wrist Complete Right  Result Date: 04/10/2017 CLINICAL DATA:  Status post syncopal episode yesterday. The patient porch right hand and wrist pain. EXAM: RIGHT WRIST - COMPLETE 3+ VIEW COMPARISON:  Right hand series of today's date FINDINGS: The bones are subjectively osteopenic. The distal radius and ulna are intact. The radiocarpal, intercarpal, and carpometacarpal joint spaces are reasonably well maintained for age. Specific attention to the scaphoid reveals no acute abnormality. There is a small erosion involving  the base of the fifth metacarpal. IMPRESSION: There is no acute fracture nor dislocation of the bones of the right wrist. Electronically Signed   By: David  Martinique M.D.   On: 04/10/2017 12:00   Ct Head Wo Contrast  Result Date: 04/10/2017 CLINICAL DATA:  Syncopal episode yesterday status post fall. EXAM: CT HEAD WITHOUT CONTRAST TECHNIQUE: Contiguous axial images were obtained from the base of the skull through the vertex without intravenous contrast. COMPARISON:  Head CT September 11, 2014 FINDINGS: Brain: There is no midline shift hydrocephalus or mass. No acute hemorrhage or acute transcortical infarct is identified. There is chronic diffuse atrophy. Chronic bilateral periventricular white matter small vessel ischemic change is identified. 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 Vascular: No hyperdense vessel or unexpected calcification. Skull: Normal. Negative for fracture or focal lesion. There is nasal septal deviation from right to left unchanged. Sinuses/Orbits: No acute finding. Other: None. IMPRESSION: No focal acute intracranial abnormality identified. Chronic diffuse atrophy. Chronic bilateral periventricular white matter small vessel ischemic change. Electronically Signed   By: Abelardo Diesel M.D.   On: 04/10/2017 16:04   Ct Knee Right Wo Contrast  Result Date: 04/10/2017 CLINICAL DATA:  Syncopal episode. Patient fell and struck face, knee and wrist. Pain. Unable to weightbear. EXAM: CT OF THE  KNEE WITHOUT CONTRAST TECHNIQUE: Multidetector CT imaging of the knee was performed according to the standard protocol. Multiplanar CT image reconstructions were also generated. COMPARISON:  None. FINDINGS: Bones/Joint/Cartilage No acute fracture or suspicious osseous lesions. No joint dislocations. Mild femorotibial joint space narrowing consistent with osteoarthritis. Ligaments Suboptimally assessed by CT. Muscles and Tendons No muscle atrophy, intramuscular hemorrhage or  mass. Intact appearing extensor mechanism tendons. Soft tissues No significant soft tissue swelling.  Trace joint effusion. IMPRESSION: 1. No fracture or malalignment of right knee. 2. Osteoarthritis of the femorotibial compartment with slight joint space narrowing. 3. Trace fluid in the knee joint likely physiologic. Electronically Signed   By: Ashley Royalty M.D.   On: 04/10/2017 17:27   Ct Hip Right Wo Contrast  Result Date: 04/10/2017 CLINICAL DATA:  Syncopal episode yesterday. Fell and injured right hip. EXAM: CT OF THE RIGHT HIP WITHOUT CONTRAST TECHNIQUE: Multidetector CT imaging of the right hip was performed according to the standard protocol. Multiplanar CT image reconstructions were also generated. COMPARISON:  Radiographs 04/02/2017 FINDINGS: The right hip is normally located. No definite hip fracture. Moderate hip joint degenerative changes. The visualized right hemipelvis is intact. No pubic rami fractures. The pubic symphysis is intact. The surrounding right hip and pelvic musculature appear grossly normal.  No obvious hematoma. No significant intrapelvic abnormalities. IMPRESSION: No definite hip fracture. If symptoms persist MRI is recommended for further evaluation. Intact right hemipelvis. No obvious muscle injury. Electronically Signed   By: Marijo Sanes M.D.   On: 04/10/2017 17:23   Dg Knee Complete 4 Views Right  Result Date: 04/10/2017 CLINICAL DATA:  Syncopal episode yesterday. Patient reports right knee pain. EXAM: RIGHT KNEE - COMPLETE 4+ VIEW COMPARISON:  None in PACs FINDINGS: The bones are subjectively adequately mineralized. There is no acute fracture or dislocation. There may be a small suprapatellar effusion. There is no significant joint space loss. IMPRESSION: Probable small suprapatellar effusion. No acute fracture nor dislocation. Electronically Signed   By: David  Martinique M.D.   On: 04/10/2017 12:01   Dg Hand Complete Right  Result Date: 04/10/2017 CLINICAL DATA:   Syncopal episode yesterday. Right hand and wrist pain. Chronic flexion contracture of the fifth finger at the PIP joint. EXAM: RIGHT HAND - COMPLETE 3+ VIEW COMPARISON:  No previous studies available in [facility] FINDINGS: The bones of the right hand are subjectively osteopenic. The fixed flexion contracture at the PIP joint of the fifth finger is observed. No acute fracture is demonstrated. The interphalangeal, MCP, and CMC joint spaces are reasonably well-maintained. There is an erosion at the base of the fifth metacarpal. IMPRESSION: There is no acute bony abnormality of the right hand. Electronically Signed   By: David  Martinique M.D.   On: 04/10/2017 11:59   Dg Hip Unilat  With Pelvis 2-3 Views Right  Result Date: 04/10/2017 CLINICAL DATA:  Syncopal episode.  Fall. EXAM: DG HIP (WITH OR WITHOUT PELVIS) 2-3V RIGHT COMPARISON:  CT 08/17/2004. FINDINGS: Diffuse osteopenia and degenerative change. No acute bony abnormality identified. Left hip replacement. Hardware intact. IMPRESSION: No acute bony abnormality identified. Electronically Signed   By: [place]   On: 04/10/2017 11:57      Scheduled Meds: . amLODipine  5 mg Oral Daily  . atenolol  25 mg Oral Daily  . docusate sodium  100 mg Oral BID  . folic acid  1 mg Oral Daily  . LORazepam  0-4 mg Oral Q6H   Followed by  . [START ON 04/12/2017] LORazepam  0-4 mg Oral Q12H  . multivitamin with minerals  1 tablet Oral Daily  . thiamine  100 mg Oral Daily   Or  . thiamine  100 mg Intravenous Daily   Continuous Infusions: . cefTRIAXone (ROCEPHIN)  IV Stopped (04/10/17 2245)     LOS: 0 days    Time spent: 25 minutes  Greater than 50% of the time spent on counseling and coordinating the care.   Leisa Lenz, MD [facility] Pager [phone]  If 7PM-7AM, please contact night-coverage [email or web address] Password [facility] 04/11/2017, 8:52 AM

## 2017-04-12 LAB — URINE CULTURE

## 2017-04-12 MED ORDER — CEPHALEXIN 500 MG PO CAPS: 500.0000 mg | ORAL_CAPSULE | Freq: Two times a day (BID) | ORAL | 0 refills | Status: DC

## 2017-04-12 NOTE — Discharge Instructions (Signed)

## 2017-04-12 NOTE — Discharge Summary (Addendum)
Physician Discharge Summary  COLE KLUGH YBO:[id number] DOB: September 01, 1940 DOA: 04/10/2017  PCP: Josetta Huddle, MD  Admit date: 04/10/2017 Discharge date: 04/12/2017  Recommendations for Outpatient Follow-up:  1. Continue Keflex for UTI as prescribed.   Discharge Diagnoses:  Active Problems:   Alcohol abuse   PAF (paroxysmal atrial fibrillation) (HCC)   Syncope   Breast cancer of upper-outer quadrant of right female breast ([place])   Fall   Acute lower UTI   Accelerated hypertension    Discharge Condition: stable   Diet recommendation: as tolerated   History of present illness:  76 year old female with past medical history of hypertension, paroxysmal atrial fibrillation but not on anticoagulation secondary to history of alcohol dependence and risk of fall. Patient presented to ED status post fall on her right side. Patient does not fully remember the event. Patient last alcoholic beverage was about 3 days prior to the admission. On admission, patient was hemodynamically stable. Multiple plain films showed no acute fractures of the knee, hip, shoulder or elbow other than nondisplaced fracture of the right seventh and eighth ribs. Urinalysis was suggestive of urinary tract infection and she was started on empiric Rocephin while awaiting urine culture results.   Hospital Course:   Active Problems:   Mechanical fall / syncope and collapse - Secondary to history of alcohol abuse - No acute findings on CT head - No acute fractures on plain films other than the fractures of right seventh and eighth ribs - 2 D ECHO - normal EF, grade 2 DD - Per PT - HH recommended, orders placed   History of alcohol abuse   Alcohol abuse - Alcohol level not obtained on the admission - No reports of withdrawals     PAF (paroxysmal atrial fibrillation) (HCC) - CHADS vasc score 3 - Not on anticoagulation secondary to risk of fall from alcohol abuse - Continue atenolol     Essential hypertension -  Continue Norvasc and atenolol     Acute lower UTI - Stop rocephin  - Urine cx - multiple species none predominant - Will Continue Keflex on discharge    DVT prophylaxis: SCD's Code Status: full code  Family Communication: no family at the bedside    Consultants:   PT  Procedures:   ECHO - pending   Antimicrobials:   Rocephin 04/10/2017 --> 8/30  Signed:  Leisa Lenz, MD  [facility] 04/12/2017, 10:55 AM  Pager #: [phone]  Time spent in minutes: more than 30 minutes  Discharge Exam: Vitals:   04/11/17 2050 04/12/17 0640  BP: 130/70 117/82  Pulse: 67 63  Resp:  19  Temp: 98.5 F (36.9 C) 98 F (36.7 C)  SpO2: 93% 97%   Vitals:   04/11/17 1009 04/11/17 1523 04/11/17 2050 04/12/17 0640  BP: (!) 156/85 131/71 130/70 117/82  Pulse: 66 64 67 63  Resp:  19  19  Temp:  98.1 F (36.7 C) 98.5 F (36.9 C) 98 F (36.7 C)  TempSrc:  Oral Oral Oral  SpO2:  96% 93% 97%  Weight:      Height:        General: Pt is alert, follows commands appropriately, not in acute distress Cardiovascular: Regular rate and rhythm, S1/S2 + Respiratory: Clear to auscultation bilaterally, no wheezing, no crackles, no rhonchi Abdominal: Soft, non tender, non distended, bowel sounds +, no guarding Extremities: no edema, no cyanosis, pulses palpable bilaterally DP and PT Neuro: Grossly nonfocal  Discharge Instructions  Discharge Instructions    Call  MD for:  persistant nausea and vomiting    Complete by:  As directed    Call MD for:  severe uncontrolled pain    Complete by:  As directed    Diet - low sodium heart healthy    Complete by:  As directed    Discharge instructions    Complete by:  As directed    Continue Keflex as prescribed.   Increase activity slowly    Complete by:  As directed      Allergies as of 04/12/2017      Reactions   Ambien [zolpidem Tartrate] Other (See Comments)   Causes nightmares      Medication List    STOP taking these  medications   anastrozole 1 MG tablet Commonly known as:  ARIMIDEX   aspirin EC 093 MG tablet   folic acid 1 MG tablet Commonly known as:  FOLVITE   methocarbamol 500 MG tablet Commonly known as:  ROBAXIN   simvastatin 20 MG tablet Commonly known as:  ZOCOR   thiamine 100 MG tablet     TAKE these medications   ADVIL 200 MG tablet Generic drug:  ibuprofen Take 400 mg by mouth every 6 (six) hours as needed for fever, headache or mild pain. Reported on 01/12/2016   amLODipine 5 MG tablet Commonly known as:  NORVASC Take 5 mg by mouth daily.   atenolol 25 MG tablet Commonly known as:  TENORMIN Take 1 tablet (25 mg total) by mouth daily.   cephALEXin 500 MG capsule Commonly known as:  KEFLEX Take 1 capsule (500 mg total) by mouth 2 (two) times daily.   cholecalciferol 1000 units tablet Commonly known as:  VITAMIN D Take 1,000 Units by mouth once a week.            Discharge Care Instructions        Start     Ordered   04/12/17 0000  Increase activity slowly     04/12/17 1055   04/12/17 0000  Diet - low sodium heart healthy     04/12/17 1055   04/12/17 0000  Discharge instructions    Comments:  Continue Keflex as prescribed.   04/12/17 1055   04/12/17 0000  Call MD for:  persistant nausea and vomiting     04/12/17 1055   04/12/17 0000  Call MD for:  severe uncontrolled pain     04/12/17 1055   04/12/17 0000  cephALEXin (KEFLEX) 500 MG capsule  2 times daily     04/12/17 1100      Follow-up Information    Josetta Huddle, MD. Schedule an appointment as soon as possible for a visit in 2 day(s).   Specialty:  Internal Medicine Contact information: [address] [place] [address] [phone]            The results of significant diagnostics from this hospitalization (including imaging, microbiology, ancillary and laboratory) are listed below for reference.    Significant Diagnostic Studies: Dg Chest 2 View  Result Date:  04/11/2017 CLINICAL DATA:  Shortness of breath for 3 days. Fell at church on Monday. Evaluate for rib fracture. EXAM: CHEST  2 VIEW COMPARISON:  04/10/2017 FINDINGS: Mild atelectasis at the left base, increased. Lateral right seventh and eighth rib fractures are favored chronic and healed. No definite acute fracture is seen. Summation shadows and obliquity limits visualization of thoracic vertebrae, no convincing acute fracture. No evidence of hemothorax or pneumothorax. Biapical pleural based scarring, fairly  symmetric. Normal heart size and mediastinal contours. IMPRESSION: 1. Mild atelectasis at the left base, mildly increased from yesterday. 2. No hemothorax or pneumothorax. 3. Lateral right seventh and eighth rib fractures noted yesterday are favored remote and healed. Electronically Signed   By: Monte Fantasia M.D.   On: 04/11/2017 08:51   Dg Chest 2 View  Result Date: 04/10/2017 CLINICAL DATA:  Syncope and fall. Right sided chest wall tenderness. EXAM: CHEST  2 VIEW COMPARISON:  Chest x-ray dated September 11, 2014. FINDINGS: The cardiomediastinal silhouette is normal in size. Normal pulmonary vascularity. Atherosclerotic calcification of the aortic arch. No focal consolidation, pleural effusion, or pneumothorax. Possible nondisplaced fractures of the right anterolateral seventh and eighth ribs. IMPRESSION: Possible nondisplaced fractures of the right anterolateral seventh and eighth ribs. No active cardiopulmonary disease. Electronically Signed   By: Titus Dubin M.D.   On: 04/10/2017 15:32   Dg Shoulder Right  Result Date: 04/10/2017 CLINICAL DATA:  Syncopal episode. The patient reports shoulder pain. EXAM: RIGHT SHOULDER - 2+ VIEW COMPARISON:  Chest x-ray of September 11, 2014 which included a portion of the right shoulder. FINDINGS: The bones are subjectively mildly osteopenic. There is no lytic nor blastic lesion. No acute fracture nor dislocation is observed. There is mild narrowing of the  glenohumeral joint space. There is moderate narrowing of the AC joint space. The subacromial subdeltoid space is normal. The right clavicle is intact. IMPRESSION: There is no acute bony abnormality of the right shoulder. Mild age-appropriate degenerative changes are observed. Electronically Signed   By: David  Martinique M.D.   On: 04/10/2017 12:04   Dg Elbow Complete Right  Result Date: 04/10/2017 CLINICAL DATA:  Syncopal episode yesterday. Awakened with left elbow pain. EXAM: RIGHT ELBOW - COMPLETE 3+ VIEW COMPARISON:  None in PACs FINDINGS: The bones of the elbow are subjectively mildly osteopenic. The radial head is intact. The a olecranon is normal. The condylar and supracondylar region of the distal humerus are normal. There is no joint effusion. IMPRESSION: There is no acute or significant chronic bony abnormality of the right elbow. Electronically Signed   By: David  Martinique M.D.   On: 04/10/2017 12:02   Dg Wrist Complete Right  Result Date: 04/10/2017 CLINICAL DATA:  Status post syncopal episode yesterday. The patient porch right hand and wrist pain. EXAM: RIGHT WRIST - COMPLETE 3+ VIEW COMPARISON:  Right hand series of today's date FINDINGS: The bones are subjectively osteopenic. The distal radius and ulna are intact. The radiocarpal, intercarpal, and carpometacarpal joint spaces are reasonably well maintained for age. Specific attention to the scaphoid reveals no acute abnormality. There is a small erosion involving the base of the fifth metacarpal. IMPRESSION: There is no acute fracture nor dislocation of the bones of the right wrist. Electronically Signed   By: David  Martinique M.D.   On: 04/10/2017 12:00   Ct Head Wo Contrast  Result Date: 04/10/2017 CLINICAL DATA:  Syncopal episode yesterday status post fall. EXAM: CT HEAD WITHOUT CONTRAST TECHNIQUE: Contiguous axial images were obtained from the base of the skull through the vertex without intravenous contrast. COMPARISON:  Head CT September 11, 2014 FINDINGS: Brain: There is no midline shift hydrocephalus or mass. No acute hemorrhage or acute transcortical infarct is identified. There is chronic diffuse atrophy. Chronic bilateral periventricular white matter small vessel ischemic change is identified. 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 Vascular: No hyperdense vessel or unexpected calcification. Skull: Normal. Negative for fracture or  focal lesion. There is nasal septal deviation from right to left unchanged. Sinuses/Orbits: No acute finding. Other: None. IMPRESSION: No focal acute intracranial abnormality identified. Chronic diffuse atrophy. Chronic bilateral periventricular white matter small vessel ischemic change. Electronically Signed   By: Abelardo Diesel M.D.   On: 04/10/2017 16:04   Ct Knee Right Wo Contrast  Result Date: 04/10/2017 CLINICAL DATA:  Syncopal episode. Patient fell and struck face, knee and wrist. Pain. Unable to weightbear. EXAM: CT OF THE  KNEE WITHOUT CONTRAST TECHNIQUE: Multidetector CT imaging of the knee was performed according to the standard protocol. Multiplanar CT image reconstructions were also generated. COMPARISON:  None. FINDINGS: Bones/Joint/Cartilage No acute fracture or suspicious osseous lesions. No joint dislocations. Mild femorotibial joint space narrowing consistent with osteoarthritis. Ligaments Suboptimally assessed by CT. Muscles and Tendons No muscle atrophy, intramuscular hemorrhage or mass. Intact appearing extensor mechanism tendons. Soft tissues No significant soft tissue swelling.  Trace joint effusion. IMPRESSION: 1. No fracture or malalignment of right knee. 2. Osteoarthritis of the femorotibial compartment with slight joint space narrowing. 3. Trace fluid in the knee joint likely physiologic. Electronically Signed   By: Ashley Royalty M.D.   On: 04/10/2017 17:27   Ct Hip Right Wo Contrast  Result Date: 04/10/2017 CLINICAL DATA:  Syncopal episode yesterday. Fell  and injured right hip. EXAM: CT OF THE RIGHT HIP WITHOUT CONTRAST TECHNIQUE: Multidetector CT imaging of the right hip was performed according to the standard protocol. Multiplanar CT image reconstructions were also generated. COMPARISON:  Radiographs 04/02/2017 FINDINGS: The right hip is normally located. No definite hip fracture. Moderate hip joint degenerative changes. The visualized right hemipelvis is intact. No pubic rami fractures. The pubic symphysis is intact. The surrounding right hip and pelvic musculature appear grossly normal. No obvious hematoma. No significant intrapelvic abnormalities. IMPRESSION: No definite hip fracture. If symptoms persist MRI is recommended for further evaluation. Intact right hemipelvis. No obvious muscle injury. Electronically Signed   By: Marijo Sanes M.D.   On: 04/10/2017 17:23   Dg Knee Complete 4 Views Right  Result Date: 04/10/2017 CLINICAL DATA:  Syncopal episode yesterday. Patient reports right knee pain. EXAM: RIGHT KNEE - COMPLETE 4+ VIEW COMPARISON:  None in PACs FINDINGS: The bones are subjectively adequately mineralized. There is no acute fracture or dislocation. There may be a small suprapatellar effusion. There is no significant joint space loss. IMPRESSION: Probable small suprapatellar effusion. No acute fracture nor dislocation. Electronically Signed   By: David  Martinique M.D.   On: 04/10/2017 12:01   Dg Hand Complete Right  Result Date: 04/10/2017 CLINICAL DATA:  Syncopal episode yesterday. Right hand and wrist pain. Chronic flexion contracture of the fifth finger at the PIP joint. EXAM: RIGHT HAND - COMPLETE 3+ VIEW COMPARISON:  No previous studies available in [facility] FINDINGS: The bones of the right hand are subjectively osteopenic. The fixed flexion contracture at the PIP joint of the fifth finger is observed. No acute fracture is demonstrated. The interphalangeal, MCP, and CMC joint spaces are reasonably well-maintained. There is an erosion at the base  of the fifth metacarpal. IMPRESSION: There is no acute bony abnormality of the right hand. Electronically Signed   By: David  Martinique M.D.   On: 04/10/2017 11:59   Dg Hip Unilat  With Pelvis 2-3 Views Right  Result Date: 04/10/2017 CLINICAL DATA:  Syncopal episode.  Fall. EXAM: DG HIP (WITH OR WITHOUT PELVIS) 2-3V RIGHT COMPARISON:  CT 08/17/2004. FINDINGS: Diffuse osteopenia and degenerative change. No acute bony abnormality  identified. Left hip replacement. Hardware intact. IMPRESSION: No acute bony abnormality identified. Electronically Signed   By: Marcello Moores  Register   On: 04/10/2017 11:57    Microbiology: Recent Results (from the past 240 hour(s))  Urine culture     Status: Abnormal   Collection Time: 04/10/17  4:47 PM  Result Value Ref Range Status   Specimen Description URINE, CLEAN CATCH  Final   Special Requests NONE  Final   Culture MULTIPLE SPECIES PRESENT, SUGGEST RECOLLECTION (A)  Final   Report Status 04/12/2017 FINAL  Final     Labs: Basic Metabolic Panel:  Recent Labs Lab 04/10/17 1411 04/11/17 0414  NA 133* 134*  K 4.4 4.0  CL 99* 102  CO2 24 24  GLUCOSE 119* 121*  BUN 6 10  CREATININE 0.67 0.62  CALCIUM 8.9 8.7*   Liver Function Tests: No results for input(s): AST, ALT, ALKPHOS, BILITOT, PROT, ALBUMIN in the last 168 hours. No results for input(s): LIPASE, AMYLASE in the last 168 hours. No results for input(s): AMMONIA in the last 168 hours. CBC:  Recent Labs Lab 04/10/17 1411 04/11/17 0414  WBC 10.5 9.4  HGB 16.5* 14.2  HCT 48.4* 42.5  MCV 95.8 95.7  PLT 259 233   Cardiac Enzymes: No results for input(s): CKTOTAL, CKMB, CKMBINDEX, TROPONINI in the last 168 hours. BNP: BNP (last 3 results) No results for input(s): BNP in the last 8760 hours.  ProBNP (last 3 results) No results for input(s): PROBNP in the last 8760 hours.  CBG:  Recent Labs Lab 04/10/17 1357  GLUCAP 105*

## 2017-04-12 NOTE — Care Management Note (Signed)
Case Management Note  Patient Details  Name: EVELINA LORE MRN: [id number] Date of Birth: [birth year]  Subjective/Objective: Provided patient w/HHC agency provider list for choice-chose [facility]-rep Jermaine able to accept. [place] ordered. No further CM needs.                   Action/Plan:d/c home w/HHC.   Expected Discharge Date:  04/12/17               Expected Discharge Plan:  [place]  In-House Referral:  Clinical Social Work  Discharge planning Services  CM Consult  Post Acute Care Choice:  Durable Medical Equipment (Has rw) Choice offered to:  Patient  DME Arranged:    DME Agency:     HH Arranged:  RN, PT, OT, Nurse's Aide [place] Agency:  [place]  Status of Service:  Completed, signed off  If discussed at [place] of Stay Meetings, dates discussed:    Additional Comments:  Dessa Phi, RN 04/12/2017, 11:46 AM

## 2017-04-13 DIAGNOSIS — C50411 Malignant neoplasm of upper-outer quadrant of right female breast: Secondary | ICD-10-CM | POA: Diagnosis not present

## 2017-04-26 DIAGNOSIS — R1314 Dysphagia, pharyngoesophageal phase: Secondary | ICD-10-CM | POA: Diagnosis not present

## 2017-04-26 DIAGNOSIS — Z1211 Encounter for screening for malignant neoplasm of colon: Secondary | ICD-10-CM | POA: Diagnosis not present

## 2017-04-26 DIAGNOSIS — Z8719 Personal history of other diseases of the digestive system: Secondary | ICD-10-CM | POA: Diagnosis not present

## 2017-04-30 DIAGNOSIS — K5730 Diverticulosis of large intestine without perforation or abscess without bleeding: Secondary | ICD-10-CM | POA: Diagnosis not present

## 2017-04-30 DIAGNOSIS — K2930 Chronic superficial gastritis without bleeding: Secondary | ICD-10-CM | POA: Diagnosis not present

## 2017-04-30 DIAGNOSIS — D126 Benign neoplasm of colon, unspecified: Secondary | ICD-10-CM | POA: Diagnosis not present

## 2017-04-30 DIAGNOSIS — K222 Esophageal obstruction: Secondary | ICD-10-CM | POA: Diagnosis not present

## 2017-04-30 DIAGNOSIS — Z1211 Encounter for screening for malignant neoplasm of colon: Secondary | ICD-10-CM | POA: Diagnosis not present

## 2017-04-30 DIAGNOSIS — R1310 Dysphagia, unspecified: Secondary | ICD-10-CM | POA: Diagnosis not present

## 2017-05-07 DIAGNOSIS — Z1211 Encounter for screening for malignant neoplasm of colon: Secondary | ICD-10-CM | POA: Diagnosis not present

## 2017-05-07 DIAGNOSIS — D126 Benign neoplasm of colon, unspecified: Secondary | ICD-10-CM | POA: Diagnosis not present

## 2017-05-07 DIAGNOSIS — K2930 Chronic superficial gastritis without bleeding: Secondary | ICD-10-CM | POA: Diagnosis not present

## 2017-06-21 ENCOUNTER — Other Ambulatory Visit: Payer: Self-pay | Admitting: Gastroenterology

## 2017-06-21 NOTE — Progress Notes (Unsigned)
Current Medications Reason for Appointment   1. est pt-dysphagia-refer Dr Lesleigh Noe live-[facility]-dr   2. Dysphagia./KW  Taking  History of Present Illness  Amlodipine Besylate 5 Tablet TAKE 1 TABLET BY MOUTH ONCE DAILY  General:  [name] was seen in 9/17 for history of esopahgeal stricture, dysphagia and for screening colonoscopy. EGD showed a low grade narrowing with schatzki's ring which was dilated with a 20 mm balloon. Antral biopsies showed chronic inactive gastritis , no H pylori. Colonoscopy showed 4 tubular adenomas, 1 was 2.5 cm in size and was recommended to repeat in 1 year.SHe had severe right sided diverticulosis. She reports that post dilatation she did well for 1 week.At that time she had no problems with solids and liquids. When she swallows solids it gets stuck in her throat(lower throat /neck area) and she needs to stick a finger to regurgitate it. She eats cereal or oatmeal in the mornings or grits, has problems swallowing eggs, she eats a sandwich(1/2 of it) for lunch, then at dinner time she has chicken noodle soup or some kind of soup without meat in it, mostly broth(chicken/beef). SHe has no problem swallowing liquids. She has a good appetite and has regular BMs. Denies abdominal pain, nausea or vomiting. SHe stomach feels "swollen" and thinks it is gas mostly in the left side.  Metoprolol Succinate 25 MG Tablet Extended Release 24 Hour 1 tablet Orally Once a day    Vitamin D3 2000 UNIT Capsule 1 capsule Orally Once a day    Vitamin B 12 injection 1000ug liquid injection monthly    Vitamin B-12 500 MCG Tablet 2 tablets Orally Once a day    Folic Acid 1 MG Tablet 1 tablet Orally Once a day    Nexium 24HR(Esomeprazole Magnesium) 20 MG Capsule Delayed Release 1 capsule Orally Once a day    Omeprazole 20 MG Tablet Delayed Release Disintegrating 2 tablet Orally Once a day    Not-Taking    Esomeprazole Magnesium 40 MG Capsule Delayed Release 1 capsule Orally Once  a day    Advil(Ibuprofen) 200 MG Tablet 2 tablets Orally Three times a day as needed for fever, headache, or mild pain    Ensure - Liquid Orally    Simvastatin 20 MG Tablet 1 tablet in the evening Orally Once a day    Thiamine 50 MG Capsule 2 capsules Orally Once a day    Medication List reviewed and reconciled with the patient     Vital Signs   Wt 130.4, Wt change 1.8 lb, Ht 70, BMI 18.71, Temp 98.5, Pulse sitting 76, BP sitting 120/84.  Past Medical History Examination  Gastroesophageal reflux with hiatal hernia and esophageal stricture.  Gastroenterology:: GENERAL APPEARANCE: Well developed, well nourished, no active distress, pleasant, no acute distress .  EYES: Lids and conjunctiva normal. Sclera normal, pupils equal and reactive .  ORAL CAVITY: Lips, teeth and gums are normal. Pharynx, tongue, mucosa normal .  SCLERA: anicteric .  NECK Full ROM, trachea midline, no thyromegaly or masses .  CARDIOVASCULAR PMI LS border. Normal RRR w/o murmers or gallops. No peripheral edema .  RESPIRATORY Breath sounds normal. Respiration even and unlabored .  ABDOMEN No masses palpated. Liver and spleen not palpated, normal. Bowel sounds normal, Abdomen not distended .  EXTREMITIES: No edema, pulses intact .  NEURO: normal strength and reflexes, cranial nerves II-XII grossly intact, normal gait .  PSYCH: mood/affect normal .    Hypertension.    Kidney stone, passed spontaneously 1997.  Chronic insomnia.    Paroxysmal atrial fibrillation.    B complex deficiency.    Chronic alcohol use.    esophageal stricture December, 2009.    vitamin D deficiency.    Hyperlipidemia.    History bacterial vaginosis.    Situational depression.    severe Dupuytren's contracture right fifth digit and left fourth and fifth digits.    episode of gross hematuria, 1998, GU workup normal.    Fibrocystic breast disease.    remote left frontal cerebral infarct, February, 2007, on head CT scanning.     closed left hip fracture, May, 2014.    influenza with ER visit, January, 2015.    hospitalization for syncopal episode, June 04, 2014.    right parietal stroke noted on workup during hospitalization October, 2355, possibly embolic with history of A. fib.    ER visit for headache, September 05, 2014.    ER visit for mental status change, alcohol level 268.    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 Rolm Bookbinder, [facility] Surgery - Dr. Georga Hacking, oncology, 5 - 03/2016.    Dysphagia.    Surgical History Assessments  right ureteroscopy with lithotripsy 08/2001 1. Pharyngoesophageal dysphagia - R13.14 (Primary)  esophageal dilatation, Dr. Earle Gell for stricture 08/2008 2. Tubular adenoma of colon - D12.6  hysterectomy    bilateral mastectomies with sentinel node biopsies, Dr. Rolm Bookbinder 01/2016   esoph dilation - Dr. Wynetta Emery 04/2016   colonoscopy 04/30/17   endoscopy with dilatation - Dr. Therisa Doyne 04/30/17   Family History Treatment  Father: deceased 74 yrs, diagnosed with Hypertension, Coronary artery disease 1. Pharyngoesophageal dysphagia  IMAGING: Esophageal Manometry IMAGING: Esophagoscopy     Whitfield,Dia 06/21/2017 01:57:06 PM > scheduled for 07/13/17-consent read and signed-insurance benefits reviewed and signed-prep instructions reviewed with pt.    Notes: She has a low grade schatzki's ring which was dilated to 20 cm, but patient reports relief in dysphagia for only a week post procedure. Will schedule for a repeat EGD wtih dilatation with 20 mm for longer period and will get esophageal biopsies to r/o EOE. Meanwhile, she may benefit from esophageal manomtery to r/o motility disorder. Patient currently takes omeprazole 20 mg 2 pills day without much relief.  Referral To: Reason:Esophageal Manometry-spoke with [phone]   Mother: deceased 47 yrs, Deceased since 1997-11-14, diagnosed with CVA  2. Tubular adenoma of colon   Notes: Repeat colonoscopy advised in 1 year, a total of 4 tubular adenomas removed, 1 of them was 2.5 cm in size.   Father died age 47 and had hypertension. Mother died in 11-14-97 but not sure what health issues she had,  No Family History of Colon Cancer, Polyps, or Liver Disease.   Social History   General:  Tobacco use  cigarettes: Never smoked Tobacco history last updated 06/13/2017 Alcohol: yes, Treatment sherry and beer.  Caffeine: yes.  no Recreational drug use.  Marital Status: widowed.  Children: 4 children.  no Smoking.    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 Rolm Bookbinder 01/20/2016   had a fall 04/2017   Review of Systems   GI PROCEDURE:  no Pacemaker/ AICD, no. no Artificial heart valves. no MI/heart attack. Abnormal heart rhythm YES, Atrial Fibrillation. no Angina. no CVA. Hypertension YES. no Hypotension. no Asthma, COPD. no Sleep apnea. no Seizure disorders. no Artificial joints. no Severe DJD. no Diabetes. no Significant  headaches. no Vertigo. no Depression/anxiety. no Abnormal bleeding. no Kidney Disease. no Liver disease, no. no Chance of pregnancy. no Blood transfusion.      Ronnette Juniper, MD

## 2017-07-23 NOTE — H&P (Signed)
History of Present Illness  General:  76/female was seen in 9/17 for history of esopahgeal stricture, dysphagia and for screening colonoscopy. EGD showed a low grade narrowing with schatzki's ring which was dilated with a 20 mm balloon. Antral biopsies showed chronic inactive gastritis , no H pylori. Colonoscopy showed 4 tubular adenomas, 1 was 2.5 cm in size and was recommended to repeat in 1 year.SHe had severe right sided diverticulosis. She reports that post dilatation she did well for 1 week.At that time she had no problems with solids and liquids. When she swallows solids it gets stuck in her throat(lower throat /neck area) and she needs to stick a finger to regurgitate it. She eats cereal or oatmeal in the mornings or grits, has problems swallowing eggs, she eats a sandwich(1/2 of it) for lunch, then at dinner time she has chicken noodle soup or some kind of soup without meat in it, mostly broth(chicken/beef). SHe has no problem swallowing liquids. She has a good appetite and has regular BMs. Denies abdominal pain, nausea or vomiting. SHe stomach feels "swollen" and thinks it is gas mostly in the left side.   Current Medications  Taking   Amlodipine Besylate 5 Tablet TAKE 1 TABLET BY MOUTH ONCE DAILY   Metoprolol Succinate 25 MG Tablet Extended Release 24 Hour 1 tablet Orally Once a day   Vitamin D3 2000 UNIT Capsule 1 capsule Orally Once a day   Vitamin B 12 injection 1000ug liquid injection monthly   Vitamin B-12 500 MCG Tablet 2 tablets Orally Once a day   Folic Acid 1 MG Tablet 1 tablet Orally Once a day   Nexium 24HR(Esomeprazole Magnesium) 20 MG Capsule Delayed Release 1 capsule Orally Once a day   Omeprazole 20 MG Tablet Delayed Release Disintegrating 2 tablet Orally Once a day   Not-Taking   Esomeprazole Magnesium 40 MG Capsule Delayed Release 1 capsule Orally Once a day   Advil(Ibuprofen) 200 MG Tablet 2 tablets Orally Three times a day as needed for fever, headache, or  mild pain   Ensure - Liquid Orally   Simvastatin 20 MG Tablet 1 tablet in the evening Orally Once a day   Thiamine 50 MG Capsule 2 capsules Orally Once a day   Medication List reviewed and reconciled with the patient    Past Medical History  Gastroesophageal reflux with hiatal hernia and esophageal stricture.   Hypertension.   Kidney stone, passed spontaneously 1997.   Chronic insomnia.   Paroxysmal atrial fibrillation.   B complex deficiency.   Chronic alcohol use.   esophageal stricture December, 2009.   vitamin D deficiency.   Hyperlipidemia.   History bacterial vaginosis.   Situational depression.   severe Dupuytren's contracture right fifth digit and left fourth and fifth digits.   episode of gross hematuria, 1998, GU workup normal.   Fibrocystic breast disease.   remote left frontal cerebral infarct, February, 2007, on head CT scanning.   closed left hip fracture, May, 2014.   influenza with ER visit, January, 2015.   hospitalization for syncopal episode, June 04, 2014.   right parietal stroke noted on workup during hospitalization October, 5366, possibly embolic with history of A. fib.   ER visit for headache, September 05, 2014.   ER visit for mental status change, alcohol level 268.   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 Rolm Bookbinder, [facility] - Dr. Georga Hacking, oncology, 5 - 03/2016.   Dysphagia.    Surgical History  right ureteroscopy with lithotripsy 08/2001  esophageal dilatation, Dr. Earle Gell for stricture 08/2008  hysterectomy   bilateral mastectomies with sentinel node biopsies, Dr. Rolm Bookbinder 01/2016  esoph dilation - Dr. Wynetta Emery 04/2016  colonoscopy 04/30/17  endoscopy with dilatation - Dr. Therisa Doyne 04/30/17   Family History  Father: deceased 1 yrs, diagnosed with Hypertension, Coronary artery disease  Mother: deceased 71 yrs, Deceased since 1997-11-10, diagnosed with CVA  Father  died age 43 and had hypertension. Mother died in 11-10-1997 but not sure what health issues she had,  No Family History of Colon Cancer, Polyps, or Liver Disease.   Social History  General:  Tobacco use  cigarettes: Never smoked Tobacco history last updated 06/13/2017 Alcohol: yes, Treatment sherry and beer.  Caffeine: yes.  no Recreational drug use.  Marital Status: widowed.  Children: 4 children.  no Smoking.    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 Rolm Bookbinder 01/20/2016  had a fall 04/2017   Review of Systems  GI PROCEDURE:  no Pacemaker/ AICD, no. no Artificial heart valves. no MI/heart attack. Abnormal heart rhythm YES, Atrial Fibrillation. no Angina. no CVA. Hypertension YES. no Hypotension. no Asthma, COPD. no Sleep apnea. no Seizure disorders. no Artificial joints. no Severe DJD. no Diabetes. no Significant headaches. no Vertigo. no Depression/anxiety. no Abnormal bleeding. no Kidney Disease. no Liver disease, no. no Chance of pregnancy. no Blood transfusion.      Vital Signs  Wt 130.4, Wt change 1.8 lb, Ht 70, BMI 18.71, Temp 98.5, Pulse sitting 76, BP sitting 120/84.   Examination  Gastroenterology:: GENERAL APPEARANCE: Well developed, well nourished, no active distress, pleasant, no acute distress .  EYES: Lids and conjunctiva normal. Sclera normal, pupils equal and reactive .  ORAL CAVITY: Lips, teeth and gums are normal. Pharynx, tongue, mucosa normal .  SCLERA: anicteric .  NECK Full ROM, trachea midline, no thyromegaly or masses .  CARDIOVASCULAR PMI LS border. Normal RRR w/o murmers or gallops. No peripheral edema .  RESPIRATORY Breath sounds normal. Respiration even and unlabored .  ABDOMEN No masses palpated. Liver and spleen not palpated, normal. Bowel sounds normal, Abdomen not distended .  EXTREMITIES: No edema, pulses intact .  NEURO: normal strength and reflexes,  cranial nerves II-XII grossly intact, normal gait .  PSYCH: mood/affect normal .     Assessments   1. Pharyngoesophageal dysphagia - R13.14 (Primary)   2. Tubular adenoma of colon - D12.6   Treatment  1. Pharyngoesophageal dysphagia  IMAGING: Esophageal Manometry IMAGING: Esophagoscopy    Whitfield,Dia 06/21/2017 01:57:06 PM > scheduled for 07/13/17-consent read and signed-insurance benefits reviewed and signed-prep instructions reviewed with pt.   Notes: She has a low grade schatzki's ring which was dilated to 20 cm, but patient reports relief in dysphagia for only a week post procedure. Will schedule for a repeat EGD wtih dilatation with 20 mm for longer period and will get esophageal biopsies to r/o EOE. Meanwhile, she may benefit from esophageal manomtery to r/o motility disorder. Patient currently takes omeprazole 20 mg 2 pills day without much relief.  Referral To: Reason:Esophageal Manometry-spoke with [facility]-#[id number]    2. Tubular adenoma of colon  Notes: Repeat colonoscopy advised in 1 year, a total of 4 tubular adenomas removed, 1 of them was 2.5 cm in size.    Ronnette Juniper, MD

## 2017-07-27 ENCOUNTER — Ambulatory Visit (HOSPITAL_COMMUNITY): Admission: RE | Admit: 2017-07-27 | Payer: Medicare Other | Source: Ambulatory Visit | Admitting: Gastroenterology

## 2017-07-27 ENCOUNTER — Encounter (HOSPITAL_COMMUNITY): Payer: Self-pay | Admitting: Gastroenterology

## 2017-07-27 ENCOUNTER — Encounter (HOSPITAL_COMMUNITY): Admission: RE | Payer: Self-pay | Source: Ambulatory Visit

## 2017-07-27 SURGERY — MANOMETRY, ESOPHAGUS

## 2017-07-27 NOTE — Progress Notes (Signed)
Called and spoke with Dia in Dr. Encarnacion Slates office and let her know that patient did not show up for Esophageal Manometry that was scheduled for today. I also let her know that I asked the patient to call and reschedule with their office.. Dia said she would let dr. Therisa Doyne know

## 2017-07-27 NOTE — Progress Notes (Signed)
Pt did not show up for esophageal Manometry.  Called house and left message with family member to call and reschedule test with Dr. Encarnacion Slates office.

## 2017-08-03 ENCOUNTER — Other Ambulatory Visit: Payer: Self-pay | Admitting: Gastroenterology

## 2017-08-08 ENCOUNTER — Ambulatory Visit (HOSPITAL_COMMUNITY): Admission: RE | Admit: 2017-08-08 | Payer: Medicare Other | Source: Ambulatory Visit | Admitting: Gastroenterology

## 2017-08-08 ENCOUNTER — Encounter (HOSPITAL_COMMUNITY): Admission: RE | Payer: Self-pay | Source: Ambulatory Visit

## 2017-08-08 SURGERY — MANOMETRY, ESOPHAGUS

## 2017-08-13 ENCOUNTER — Other Ambulatory Visit: Payer: Self-pay | Admitting: Gastroenterology

## 2017-08-13 NOTE — Progress Notes (Signed)
Called and left message confirming appointment for 1030 08/15/2017.

## 2017-08-15 ENCOUNTER — Ambulatory Visit (HOSPITAL_COMMUNITY)
Admission: RE | Admit: 2017-08-15 | Discharge: 2017-08-15 | Disposition: A | Discharge status: Home / Self Care | Payer: Medicare HMO | Source: Ambulatory Visit | Attending: Gastroenterology | Admitting: Gastroenterology

## 2017-08-15 ENCOUNTER — Encounter (HOSPITAL_COMMUNITY)
Admission: RE | Disposition: A | Discharge status: Home / Self Care | Payer: Self-pay | Source: Ambulatory Visit | Attending: Gastroenterology

## 2017-08-15 ENCOUNTER — Encounter (HOSPITAL_COMMUNITY): Payer: Self-pay

## 2017-08-15 DIAGNOSIS — R1314 Dysphagia, pharyngoesophageal phase: Secondary | ICD-10-CM | POA: Diagnosis not present

## 2017-08-15 DIAGNOSIS — K449 Diaphragmatic hernia without obstruction or gangrene: Secondary | ICD-10-CM | POA: Diagnosis not present

## 2017-08-15 HISTORY — PX: ESOPHAGEAL MANOMETRY: SHX5429

## 2017-08-15 SURGERY — MANOMETRY, ESOPHAGUS
Anesthesia: LOCAL

## 2017-08-15 MED ORDER — LIDOCAINE VISCOUS 2 % MT SOLN
OROMUCOSAL | Status: AC
  Filled 2017-08-15: qty 15

## 2017-08-15 SURGICAL SUPPLY — 2 items
FACESHIELD LNG OPTICON STERILE (SAFETY) IMPLANT
GLOVE BIO SURGEON STRL SZ8 (GLOVE) ×4 IMPLANT

## 2017-08-15 NOTE — Progress Notes (Signed)
Esophageal Manometry done per protocol.  Pt tolerated procedure well, without complications.  Dr. Therisa Doyne to be notified today of study.  Laverta Baltimore, RN

## 2017-08-17 ENCOUNTER — Encounter (HOSPITAL_COMMUNITY): Payer: Self-pay | Admitting: Gastroenterology

## 2017-09-28 NOTE — Progress Notes (Signed)
This encounter was created in error - please disregard.

## 2017-10-05 ENCOUNTER — Encounter (HOSPITAL_COMMUNITY): Admission: EM | Disposition: A | Discharge status: Home / Self Care | Payer: Self-pay | Source: Home / Self Care

## 2017-10-05 ENCOUNTER — Emergency Department (HOSPITAL_COMMUNITY): Payer: Medicare HMO

## 2017-10-05 ENCOUNTER — Emergency Department (HOSPITAL_COMMUNITY): Payer: Medicare HMO | Admitting: Certified Registered"

## 2017-10-05 ENCOUNTER — Encounter (HOSPITAL_COMMUNITY): Payer: Self-pay | Admitting: Emergency Medicine

## 2017-10-05 ENCOUNTER — Other Ambulatory Visit: Payer: Self-pay

## 2017-10-05 ENCOUNTER — Inpatient Hospital Stay (HOSPITAL_COMMUNITY)
Admission: EM | Admit: 2017-10-05 | Discharge: 2017-10-11 | DRG: 338 | Disposition: A | Discharge status: Home / Self Care | Payer: Medicare HMO | Attending: General Surgery | Admitting: General Surgery

## 2017-10-05 DIAGNOSIS — Z9013 Acquired absence of bilateral breasts and nipples: Secondary | ICD-10-CM | POA: Diagnosis not present

## 2017-10-05 DIAGNOSIS — I480 Paroxysmal atrial fibrillation: Secondary | ICD-10-CM | POA: Diagnosis present

## 2017-10-05 DIAGNOSIS — K3532 Acute appendicitis with perforation and localized peritonitis, without abscess: Secondary | ICD-10-CM | POA: Diagnosis present

## 2017-10-05 DIAGNOSIS — K219 Gastro-esophageal reflux disease without esophagitis: Secondary | ICD-10-CM | POA: Diagnosis present

## 2017-10-05 DIAGNOSIS — E785 Hyperlipidemia, unspecified: Secondary | ICD-10-CM | POA: Diagnosis present

## 2017-10-05 DIAGNOSIS — M810 Age-related osteoporosis without current pathological fracture: Secondary | ICD-10-CM | POA: Diagnosis present

## 2017-10-05 DIAGNOSIS — Z87891 Personal history of nicotine dependence: Secondary | ICD-10-CM

## 2017-10-05 DIAGNOSIS — R0781 Pleurodynia: Secondary | ICD-10-CM | POA: Diagnosis not present

## 2017-10-05 DIAGNOSIS — R0902 Hypoxemia: Secondary | ICD-10-CM | POA: Diagnosis not present

## 2017-10-05 DIAGNOSIS — I361 Nonrheumatic tricuspid (valve) insufficiency: Secondary | ICD-10-CM | POA: Diagnosis not present

## 2017-10-05 DIAGNOSIS — I110 Hypertensive heart disease with heart failure: Secondary | ICD-10-CM | POA: Diagnosis present

## 2017-10-05 DIAGNOSIS — R1031 Right lower quadrant pain: Secondary | ICD-10-CM | POA: Diagnosis present

## 2017-10-05 DIAGNOSIS — Z96642 Presence of left artificial hip joint: Secondary | ICD-10-CM | POA: Diagnosis present

## 2017-10-05 DIAGNOSIS — Z789 Other specified health status: Secondary | ICD-10-CM | POA: Diagnosis not present

## 2017-10-05 DIAGNOSIS — R05 Cough: Secondary | ICD-10-CM | POA: Diagnosis not present

## 2017-10-05 DIAGNOSIS — Z853 Personal history of malignant neoplasm of breast: Secondary | ICD-10-CM | POA: Diagnosis not present

## 2017-10-05 DIAGNOSIS — K352 Acute appendicitis with generalized peritonitis, without abscess: Secondary | ICD-10-CM | POA: Diagnosis not present

## 2017-10-05 DIAGNOSIS — M109 Gout, unspecified: Secondary | ICD-10-CM | POA: Diagnosis not present

## 2017-10-05 DIAGNOSIS — R079 Chest pain, unspecified: Secondary | ICD-10-CM | POA: Diagnosis not present

## 2017-10-05 DIAGNOSIS — I2510 Atherosclerotic heart disease of native coronary artery without angina pectoris: Secondary | ICD-10-CM | POA: Diagnosis present

## 2017-10-05 DIAGNOSIS — R339 Retention of urine, unspecified: Secondary | ICD-10-CM | POA: Diagnosis not present

## 2017-10-05 DIAGNOSIS — R110 Nausea: Secondary | ICD-10-CM | POA: Diagnosis not present

## 2017-10-05 DIAGNOSIS — Z90710 Acquired absence of both cervix and uterus: Secondary | ICD-10-CM

## 2017-10-05 DIAGNOSIS — N200 Calculus of kidney: Secondary | ICD-10-CM | POA: Diagnosis not present

## 2017-10-05 DIAGNOSIS — E538 Deficiency of other specified B group vitamins: Secondary | ICD-10-CM | POA: Diagnosis not present

## 2017-10-05 DIAGNOSIS — K3580 Unspecified acute appendicitis: Secondary | ICD-10-CM | POA: Diagnosis present

## 2017-10-05 DIAGNOSIS — I10 Essential (primary) hypertension: Secondary | ICD-10-CM | POA: Diagnosis present

## 2017-10-05 DIAGNOSIS — Z09 Encounter for follow-up examination after completed treatment for conditions other than malignant neoplasm: Secondary | ICD-10-CM | POA: Diagnosis not present

## 2017-10-05 DIAGNOSIS — J90 Pleural effusion, not elsewhere classified: Secondary | ICD-10-CM | POA: Diagnosis not present

## 2017-10-05 DIAGNOSIS — Z8673 Personal history of transient ischemic attack (TIA), and cerebral infarction without residual deficits: Secondary | ICD-10-CM | POA: Diagnosis not present

## 2017-10-05 DIAGNOSIS — I639 Cerebral infarction, unspecified: Secondary | ICD-10-CM | POA: Diagnosis not present

## 2017-10-05 DIAGNOSIS — I5033 Acute on chronic diastolic (congestive) heart failure: Secondary | ICD-10-CM | POA: Diagnosis not present

## 2017-10-05 HISTORY — PX: LAPAROSCOPIC APPENDECTOMY: SHX408

## 2017-10-05 LAB — URINALYSIS, ROUTINE W REFLEX MICROSCOPIC
Bacteria, UA: NONE SEEN
Bilirubin Urine: NEGATIVE
Glucose, UA: NEGATIVE mg/dL
Ketones, ur: NEGATIVE mg/dL
Nitrite: NEGATIVE
PH: 5 (ref 5.0–8.0)
Protein, ur: NEGATIVE mg/dL
Specific Gravity, Urine: 1.016 (ref 1.005–1.030)

## 2017-10-05 LAB — COMPREHENSIVE METABOLIC PANEL
ALK PHOS: 80 U/L (ref 38–126)
ALT: 13 U/L — AB (ref 14–54)
AST: 16 U/L (ref 15–41)
Albumin: 3.9 g/dL (ref 3.5–5.0)
Anion gap: 13 (ref 5–15)
BILIRUBIN TOTAL: 0.6 mg/dL (ref 0.3–1.2)
BUN: 8 mg/dL (ref 6–20)
CALCIUM: 9 mg/dL (ref 8.9–10.3)
CO2: 24 mmol/L (ref 22–32)
CREATININE: 0.64 mg/dL (ref 0.44–1.00)
Chloride: 97 mmol/L — ABNORMAL LOW (ref 101–111)
GFR calc non Af Amer: >60 mL/min (ref >60)
GLUCOSE: 114 mg/dL — AB (ref 65–99)
Potassium: 3.9 mmol/L (ref 3.5–5.1)
Sodium: 134 mmol/L — ABNORMAL LOW (ref 135–145)
Total Protein: 7.2 g/dL (ref 6.5–8.1)

## 2017-10-05 LAB — CBC
HCT: 45.7 % (ref 36.0–46.0)
Hemoglobin: 15.1 g/dL — ABNORMAL HIGH (ref 12.0–15.0)
MCH: 31.7 pg (ref 26.0–34.0)
MCHC: 33 g/dL (ref 30.0–36.0)
MCV: 95.8 fL (ref 78.0–100.0)
PLATELETS: 317 10*3/uL (ref 150–400)
RBC: 4.77 MIL/uL (ref 3.87–5.11)
RDW: 12.3 % (ref 11.5–15.5)
WBC: 13.6 10*3/uL — ABNORMAL HIGH (ref 4.0–10.5)

## 2017-10-05 LAB — PROTIME-INR
INR: 0.96
Prothrombin Time: 12.6 seconds (ref 11.4–15.2)

## 2017-10-05 LAB — LIPASE, BLOOD: Lipase: 33 U/L (ref 11–51)

## 2017-10-05 SURGERY — APPENDECTOMY, LAPAROSCOPIC
Anesthesia: General | Site: Abdomen

## 2017-10-05 MED ORDER — VITAMIN B-1 100 MG PO TABS
100.0000 mg | ORAL_TABLET | Freq: Every day | ORAL | Status: DC
Start: 2017-10-05 — End: 2017-10-06

## 2017-10-05 MED ORDER — FENTANYL CITRATE (PF) 100 MCG/2ML IJ SOLN: 25.0000 ug | INTRAMUSCULAR | Status: DC | PRN

## 2017-10-05 MED ORDER — SUGAMMADEX SODIUM 200 MG/2ML IV SOLN
INTRAVENOUS | Status: DC | PRN
  Administered 2017-10-05: 150 mg via INTRAVENOUS

## 2017-10-05 MED ORDER — LACTATED RINGERS IV SOLN
INTRAVENOUS | Status: DC | PRN
  Administered 2017-10-05: 23:00:00 via INTRAVENOUS

## 2017-10-05 MED ORDER — FENTANYL CITRATE (PF) 100 MCG/2ML IJ SOLN
75.0000 ug | Freq: Once | INTRAMUSCULAR | Status: AC
  Administered 2017-10-05: 75 ug via INTRAVENOUS
  Filled 2017-10-05: qty 2

## 2017-10-05 MED ORDER — THIAMINE HCL 100 MG/ML IJ SOLN
100.0000 mg | Freq: Every day | INTRAMUSCULAR | Status: DC
  Administered 2017-10-05: 100 mg via INTRAVENOUS
  Filled 2017-10-05: qty 2

## 2017-10-05 MED ORDER — PIPERACILLIN-TAZOBACTAM 3.375 G IVPB 30 MIN
3.3750 g | Freq: Once | INTRAVENOUS | Status: AC
  Administered 2017-10-05: 3.375 g via INTRAVENOUS
  Filled 2017-10-05: qty 50

## 2017-10-05 MED ORDER — IOPAMIDOL (ISOVUE-300) INJECTION 61%
INTRAVENOUS | Status: AC
  Administered 2017-10-05: 100 mL via INTRAVENOUS
  Filled 2017-10-05: qty 100

## 2017-10-05 MED ORDER — MIDAZOLAM HCL 2 MG/2ML IJ SOLN
INTRAMUSCULAR | Status: DC | PRN
  Administered 2017-10-05: 1 mg via INTRAVENOUS

## 2017-10-05 MED ORDER — LORAZEPAM 1 MG PO TABS: 0.0000 mg | ORAL_TABLET | Freq: Four times a day (QID) | ORAL | Status: DC

## 2017-10-05 MED ORDER — ONDANSETRON HCL 4 MG/2ML IJ SOLN
4.0000 mg | Freq: Once | INTRAMUSCULAR | Status: AC
  Administered 2017-10-05: 4 mg via INTRAVENOUS
  Filled 2017-10-05: qty 2

## 2017-10-05 MED ORDER — 0.9 % SODIUM CHLORIDE (POUR BTL) OPTIME
TOPICAL | Status: DC | PRN
  Administered 2017-10-05: 1000 mL

## 2017-10-05 MED ORDER — ROCURONIUM BROMIDE 10 MG/ML (PF) SYRINGE
PREFILLED_SYRINGE | INTRAVENOUS | Status: DC | PRN
  Administered 2017-10-05: 20 mg via INTRAVENOUS

## 2017-10-05 MED ORDER — FENTANYL CITRATE (PF) 250 MCG/5ML IJ SOLN
INTRAMUSCULAR | Status: DC | PRN
  Administered 2017-10-05 (×2): 50 ug via INTRAVENOUS

## 2017-10-05 MED ORDER — LACTATED RINGERS IR SOLN
Status: DC | PRN
  Administered 2017-10-05: 1000 mL

## 2017-10-05 MED ORDER — PROPOFOL 10 MG/ML IV BOLUS
INTRAVENOUS | Status: DC | PRN
  Administered 2017-10-05: 120 mg via INTRAVENOUS

## 2017-10-05 MED ORDER — LORAZEPAM 1 MG PO TABS: 0.0000 mg | ORAL_TABLET | Freq: Two times a day (BID) | ORAL | Status: DC

## 2017-10-05 MED ORDER — BUPIVACAINE-EPINEPHRINE 0.25% -1:200000 IJ SOLN
INTRAMUSCULAR | Status: AC
  Filled 2017-10-05: qty 1

## 2017-10-05 MED ORDER — PROPOFOL 10 MG/ML IV BOLUS
INTRAVENOUS | Status: AC
  Filled 2017-10-05: qty 20

## 2017-10-05 MED ORDER — SODIUM CHLORIDE 0.9 % IV BOLUS (SEPSIS)
1000.0000 mL | Freq: Once | INTRAVENOUS | Status: AC
  Administered 2017-10-05: 1000 mL via INTRAVENOUS

## 2017-10-05 MED ORDER — LORAZEPAM 2 MG/ML IJ SOLN
0.0000 mg | Freq: Four times a day (QID) | INTRAMUSCULAR | Status: DC
  Administered 2017-10-05: 1 mg via INTRAVENOUS
  Filled 2017-10-05: qty 1

## 2017-10-05 MED ORDER — PHENYLEPHRINE HCL 10 MG/ML IJ SOLN
INTRAVENOUS | Status: DC | PRN
  Administered 2017-10-05: 25 ug/min via INTRAVENOUS

## 2017-10-05 MED ORDER — SUCCINYLCHOLINE CHLORIDE 200 MG/10ML IV SOSY
PREFILLED_SYRINGE | INTRAVENOUS | Status: DC | PRN
  Administered 2017-10-05: 110 mg via INTRAVENOUS

## 2017-10-05 MED ORDER — FENTANYL CITRATE (PF) 250 MCG/5ML IJ SOLN
INTRAMUSCULAR | Status: AC
  Filled 2017-10-05: qty 5

## 2017-10-05 MED ORDER — MIDAZOLAM HCL 2 MG/2ML IJ SOLN
INTRAMUSCULAR | Status: AC
  Filled 2017-10-05: qty 2

## 2017-10-05 MED ORDER — LIDOCAINE 2% (20 MG/ML) 5 ML SYRINGE
INTRAMUSCULAR | Status: DC | PRN
  Administered 2017-10-05: 60 mg via INTRAVENOUS

## 2017-10-05 MED ORDER — BUPIVACAINE-EPINEPHRINE 0.25% -1:200000 IJ SOLN
INTRAMUSCULAR | Status: DC | PRN
  Administered 2017-10-05: 33 mL

## 2017-10-05 MED ORDER — LORAZEPAM 2 MG/ML IJ SOLN: 0.0000 mg | Freq: Two times a day (BID) | INTRAMUSCULAR | Status: DC

## 2017-10-05 MED ORDER — PHENYLEPHRINE 40 MCG/ML (10ML) SYRINGE FOR IV PUSH (FOR BLOOD PRESSURE SUPPORT)
PREFILLED_SYRINGE | INTRAVENOUS | Status: DC | PRN
  Administered 2017-10-05: 200 ug via INTRAVENOUS
  Administered 2017-10-05: 160 ug via INTRAVENOUS

## 2017-10-05 SURGICAL SUPPLY — 33 items
APPLIER CLIP 5 13 M/L LIGAMAX5 (MISCELLANEOUS)
APR CLP MED LRG 5 ANG JAW (MISCELLANEOUS)
BAG SPEC RTRVL 10 TROC 200 (ENDOMECHANICALS) ×1
CABLE HIGH FREQUENCY MONO STRZ (ELECTRODE) IMPLANT
CLIP APPLIE 5 13 M/L LIGAMAX5 (MISCELLANEOUS) IMPLANT
CLOSURE WOUND 1/2 X4 (GAUZE/BANDAGES/DRESSINGS)
COVER SURGICAL LIGHT HANDLE (MISCELLANEOUS) ×3 IMPLANT
CUTTER FLEX LINEAR 45M (STAPLE) ×2 IMPLANT
DECANTER SPIKE VIAL GLASS SM (MISCELLANEOUS) ×3 IMPLANT
GLOVE BIO SURGEON STRL SZ7.5 (GLOVE) ×3 IMPLANT
GLOVE INDICATOR 8.0 STRL GRN (GLOVE) ×3 IMPLANT
GOWN STRL REUS W/TWL XL LVL3 (GOWN DISPOSABLE) ×6 IMPLANT
GRASPER SUT TROCAR 14GX15 (MISCELLANEOUS) IMPLANT
IV LACTATED RINGERS 1000ML (IV SOLUTION) ×3 IMPLANT
KIT BASIN OR (CUSTOM PROCEDURE TRAY) ×3 IMPLANT
POUCH RETRIEVAL ECOSAC 10 (ENDOMECHANICALS) ×1 IMPLANT
POUCH RETRIEVAL ECOSAC 10MM (ENDOMECHANICALS) ×2
RELOAD 45 VASCULAR/THIN (ENDOMECHANICALS) IMPLANT
RELOAD STAPLE 45 2.5 WHT GRN (ENDOMECHANICALS) IMPLANT
RELOAD STAPLE 45 3.5 BLU ETS (ENDOMECHANICALS) IMPLANT
RELOAD STAPLE TA45 3.5 REG BLU (ENDOMECHANICALS) IMPLANT
SET IRRIG TUBING LAPAROSCOPIC (IRRIGATION / IRRIGATOR) ×3 IMPLANT
SHEARS HARMONIC ACE PLUS 36CM (ENDOMECHANICALS) ×3 IMPLANT
SLEEVE XCEL OPT CAN 5 100 (ENDOMECHANICALS) ×3 IMPLANT
STRIP CLOSURE SKIN 1/2X4 (GAUZE/BANDAGES/DRESSINGS) IMPLANT
SUT MNCRL AB 4-0 PS2 18 (SUTURE) ×3 IMPLANT
SUT VICRYL 0 TIES 12 18 (SUTURE) IMPLANT
TOWEL OR 17X26 10 PK STRL BLUE (TOWEL DISPOSABLE) ×3 IMPLANT
TRAY FOLEY W/METER SILVER 16FR (SET/KITS/TRAYS/PACK) ×3 IMPLANT
TRAY LAPAROSCOPIC (CUSTOM PROCEDURE TRAY) ×3 IMPLANT
TROCAR BLADELESS OPT 5 100 (ENDOMECHANICALS) ×3 IMPLANT
TROCAR XCEL BLUNT TIP 100MML (ENDOMECHANICALS) ×3 IMPLANT
TUBING INSUF HEATED (TUBING) ×3 IMPLANT

## 2017-10-05 NOTE — Progress Notes (Signed)
A consult was received from an ED physician for Zosyn per pharmacy dosing.  The patient's profile has been reviewed for ht/wt/allergies/indication/available labs.   A one time order has been placed for Zosyn 3.375g.    Further antibiotics/pharmacy consults should be ordered by admitting physician if indicated.                       Thank you, Peggyann Juba, PharmD, BCPS 10/05/2017  9:25 PM

## 2017-10-05 NOTE — Anesthesia Procedure Notes (Signed)
Procedure Name: Intubation Date/Time: 10/05/2017 11:08 PM Performed by: Cynda Familia, CRNA Pre-anesthesia Checklist: Patient identified, Emergency Drugs available, Suction available and Patient being monitored Patient Re-evaluated:Patient Re-evaluated prior to induction Oxygen Delivery Method: Circle System Utilized Preoxygenation: Pre-oxygenation with 100% oxygen Induction Type: IV induction Ventilation: Mask ventilation without difficulty Laryngoscope Size: 2 and Miller Grade View: Grade I Tube type: Oral Number of attempts: 1 Airway Equipment and Method: Stylet Placement Confirmation: ETT inserted through vocal cords under direct vision,  positive ETCO2 and breath sounds checked- equal and bilateral Secured at: 21 cm Tube secured with: Tape Dental Injury: Teeth and Oropharynx as per pre-operative assessment  Comments: Smooth IV induction Hollis-- intubation AM CRNA atraumatic-- mouth as preop -- no teeth--- bilat BS Hollis

## 2017-10-05 NOTE — Anesthesia Preprocedure Evaluation (Addendum)
Anesthesia Evaluation  Patient identified by MRN, date of birth, ID band Patient awake    Reviewed: Allergy & Precautions, NPO status , Patient's Chart, lab work & pertinent test results  Airway Mallampati: I  TM Distance: >3 FB Neck ROM: Full    Dental  (+) Edentulous Upper, Edentulous Lower   Pulmonary    breath sounds clear to auscultation       Cardiovascular hypertension, + dysrhythmias Atrial Fibrillation  Rhythm:Regular Rate:Normal     Neuro/Psych  Headaches, PSYCHIATRIC DISORDERS Depression CVA    GI/Hepatic Neg liver ROS, GERD  ,  Endo/Other  negative endocrine ROS  Renal/GU Renal disease  negative genitourinary   Musculoskeletal  (+) Arthritis ,   Abdominal (+)  Abdomen: tender.    Peds  Hematology   Anesthesia Other Findings - HLD  Reproductive/Obstetrics                            Lab Results  Component Value Date   WBC 13.6 (H) 10/05/2017   HGB 15.1 (H) 10/05/2017   HCT 45.7 10/05/2017   MCV 95.8 10/05/2017   PLT 317 10/05/2017   Lab Results  Component Value Date   CREATININE 0.64 10/05/2017   BUN 8 10/05/2017   NA 134 (L) 10/05/2017   K 3.9 10/05/2017   CL 97 (L) 10/05/2017   CO2 24 10/05/2017   Lab Results  Component Value Date   INR 0.96 10/05/2017   INR 1.00 06/04/2014   INR 1.0 06/21/2007   Echo: Left ventricle: The cavity size was normal. Wall thickness was   normal. Systolic function was normal. The estimated ejection   fraction was in the range of 60% to 65%. Wall motion was normal;   there were no regional wall motion abnormalities. Features are   consistent with a pseudonormal left ventricular filling pattern,   with concomitant abnormal relaxation and increased filling   pressure (grade 2 diastolic dysfunction).   Anesthesia Physical Anesthesia Plan  ASA: III  Anesthesia Plan: General   Post-op Pain Management:    Induction:  Intravenous, Rapid sequence and Cricoid pressure planned  PONV Risk Score and Plan: 4 or greater and Ondansetron and Treatment may vary due to age or medical condition  Airway Management Planned: Oral ETT  Additional Equipment: None  Intra-op Plan:   Post-operative Plan: Extubation in OR  Informed Consent: I have reviewed the patients History and Physical, chart, labs and discussed the procedure including the risks, benefits and alternatives for the proposed anesthesia with the patient or authorized representative who has indicated his/her understanding and acceptance.   Dental advisory given  Plan Discussed with: CRNA  Anesthesia Plan Comments:         Anesthesia Quick Evaluation

## 2017-10-05 NOTE — ED Triage Notes (Signed)
Pt complaint of RLQ pain without n/v/d or GU symptoms.

## 2017-10-05 NOTE — Op Note (Signed)
[name] [id number] Jan 11, 1941 10/05/2017  Appendectomy, Lap, Procedure Note  Indications: The patient presented with a history of right-sided abdominal pain. A CT revealed findings consistent with acute appendicitis with microperforation  Pre-operative Diagnosis: Acute appendicitis without mention of peritonitis  Post-operative Diagnosis: acute appendicitis with microperforation  Surgeon: Greer Pickerel MD FACS  Assistants: none  Anesthesia: General endotracheal anesthesia   Procedure Details  The patient was seen again in the Holding Room. The risks, benefits, complications, treatment options, and expected outcomes were discussed with the patient and/or family. The possibilities of perforation of viscus, bleeding, recurrent infection, the need for additional procedures, failure to diagnose a condition, and creating a complication requiring transfusion or operation were discussed. There was concurrence with the proposed plan and informed consent was obtained. The site of surgery was properly noted. The patient was taken to Operating Room, identified as [name] and the procedure verified as Appendectomy. A Time Out was held and the above information confirmed.  The patient was placed in the supine position and general anesthesia was induced, along with placement of orogastric tube, SCDs, and a Foley catheter. The abdomen was prepped and draped in a sterile fashion. A 1.5 centimeter infraumbilical incision was made.  The umbilical stalk was elevated, and the midline fascia was incised with a #11 blade.  A Kelly clamp was used to confirm entrance into the peritoneal cavity.  A pursestring suture was passed around the incision with a 0 Vicryl.  A 31mm Hasson was introduced into the abdomen and the tails of the suture were used to hold the Hasson in place.   The pneumoperitoneum was then established to steady pressure of 15 mmHg.  Additional 5 mm cannulas then placed in the left lower  quadrant of the abdomen and the suprapubic region under direct visualization. A careful evaluation of the entire abdomen was carried out. The patient was placed in Trendelenburg and left lateral decubitus position. The small intestines were retracted in the cephalad and left lateral direction away from the pelvis and right lower quadrant. The patient was found to have an inflammed appendix that was extending into the pelvis. There was some evidence of perforation but no gross contamination. The Tip of the appendix was stick to epipolic appendage of the sigmoid. There was thin yellowish exudate on right pelvic sidewall  The appendix was carefully dissected. The appendix was was skeletonized with the harmonic scalpel.   The appendix was divided at its base using an endo-GIA stapler with a white load. No appendiceal stump was left in place. The appendix was removed from the abdomen with an Ecco bag through the umbilical port.  There was no evidence of bleeding, leakage, or complication after division of the appendix. Irrigation was also performed and irrigate suctioned from the abdomen as well.  The umbilical port site was closed with the purse string suture. The closure was viewed laparoscopically. There was no residual palpable fascial defect.  The trocar site skin wounds were closed with 4-0 Monocryl. Dermabond was applied to the skin incisions.  Instrument, sponge, and needle counts were correct at the conclusion of the case.   Findings: The appendix was found to be inflamed. There were not signs of necrosis.  There was perforation. There was not abscess formation.  Estimated Blood Loss:  Minimal         Drains: none         Specimens: appendix         Complications:  None; patient  tolerated the procedure well.         Disposition: PACU - hemodynamically stable.         Condition: stable  Leighton Ruff. Redmond Pulling, MD, FACS General, Bariatric, & Minimally Invasive Surgery [facility],  [place]

## 2017-10-05 NOTE — ED Notes (Signed)
General surgeon at bedside.  

## 2017-10-05 NOTE — ED Provider Notes (Signed)
[facility] DEPT Provider Note  CSN: [id number] Arrival date & time: 10/05/17 1624  Chief Complaint(s) Abdominal Pain  HPI [name] is a 77 y.o. female   The history is provided by the patient.  Abdominal Pain   This is a new problem. The current episode started 12 to 24 hours ago. The problem occurs constantly. The problem has been rapidly worsening. The pain is associated with an unknown factor. The pain is located in the RLQ. The quality of the pain is aching and sharp. The pain is moderate. Associated symptoms include nausea. Pertinent negatives include fever, diarrhea, hematochezia, melena, vomiting and dysuria. The symptoms are aggravated by palpation and certain positions. Nothing relieves the symptoms.    Past Medical History Past Medical History:  Diagnosis Date  . Alcohol abuse   . Anemia   . Arthritis   . Bacterial vaginosis   . Cancer ([place]) 01/2015   Breast cancer right   . Cerebrovascular accident (stroke) ([place]) 2/07   left frontal lobe  . Closed left hip fracture ([place]) 5/14  . Complication of anesthesia    woke up too early  with kidney stone surgery and wrist surgery  . Depression   . Dupuytren's contracture   . Fibrocystic breast disease   . Fracture of metatarsal bone of right foot    5th metatarsal  . GERD (gastroesophageal reflux disease)   . Gout   . Headache   . Hyperlipidemia LDL goal <70   . Hypertension   . Paroxysmal A-fib ([place])   . Renal calculus   . Vitamin B12 deficiency   . Vitamin D deficiency    Patient Active Problem List   Diagnosis Date Noted  . Fall 04/10/2017  . Acute lower UTI 04/10/2017  . Accelerated hypertension 04/10/2017  . Breast cancer of upper-outer quadrant of right female breast ([place]) 01/07/2016  . Stroke ([place]) 06/05/2014  . Alcohol dependence ([place]) 06/05/2014  . Dyslipidemia 06/05/2014  . Syncope 06/04/2014  . Postoperative anemia due to acute blood loss 12/19/2012  .  Osteoporosis 12/19/2012  . Osteoporosis with fracture 12/19/2012  . Hip fracture, left ([place]) 12/16/2012  . Gout 12/16/2012  . fracture of fifth right metatarsal bone 12/16/2012  . Alcohol abuse 12/16/2012  . GERD (gastroesophageal reflux disease) 12/16/2012  . PAF (paroxysmal atrial fibrillation) ([place]) 12/16/2012  . B12 deficiency 12/16/2012  . Vitamin D deficiency 12/16/2012  . Esophageal stricture 12/16/2012  . Other and unspecified hyperlipidemia 12/16/2012  . Reactive depression (situational) 12/16/2012  . Insomnia 12/16/2012  . H/O: CVA (cerebrovascular accident) 12/16/2012  . Dupuytren's contracture of both hands 12/16/2012  . Fibrocystic breast disease 12/16/2012   Home Medication(s) Prior to Admission medications   Medication Sig Start Date End Date Taking? Authorizing Provider  amLODipine (NORVASC) 5 MG tablet Take 5 mg by mouth daily.   Yes [provider]  atenolol (TENORMIN) 25 MG tablet Take 1 tablet (25 mg total) by mouth daily. 06/06/14  Yes Hongalgi, Lenis Dickinson, MD  Cyanocobalamin (VITAMIN B-12 PO) Take 1 tablet by mouth at bedtime.   Yes [provider]  cephALEXin (KEFLEX) 500 MG capsule Take 1 capsule (500 mg total) by mouth 2 (two) times daily. Patient not taking: Reported on 10/05/2017 04/12/17   Robbie Lis, MD  cholecalciferol (VITAMIN D) 1000 UNITS tablet Take 1,000 Units by mouth once a week.     [provider]  ibuprofen (ADVIL) 200 MG tablet Take 400 mg by mouth every 6 (six)  hours as needed for fever, headache or mild pain. Reported on 01/12/2016    [provider]                                                                                                                                    Past Surgical History Past Surgical History:  Procedure Laterality Date  . ABDOMINAL HYSTERECTOMY    . cataracts Bilateral   . ESOPHAGEAL MANOMETRY N/A 08/15/2017   Procedure: ESOPHAGEAL MANOMETRY (EM);  Surgeon: Ronnette Juniper, MD;   Location: [facility];  Service: Gastroenterology;  Laterality: N/A;  . ESOPHAGOGASTRODUODENOSCOPY (EGD) WITH PROPOFOL N/A 05/02/2016   Procedure: ESOPHAGOGASTRODUODENOSCOPY (EGD) WITH PROPOFOL;  Surgeon: Garlan Fair, MD;  Location: [facility];  Service: Endoscopy;  Laterality: N/A;  . HIP ARTHROPLASTY Left 12/17/2012   Procedure: LEFT HIP HEMI ARTHROPLASTY ;  Surgeon: Rozanna Box, MD;  Location: [facility];  Service: Orthopedics;  Laterality: Left;  . left wrist surgery surgery  7 yrs ago  . LEG SURGERY Left   . LITHOTRIPSY    . MASTECTOMY W/ SENTINEL NODE BIOPSY Bilateral 01/20/2016   Procedure: BILATERAL TOTAL MASTECTOMY WITH RIGHT SENTINEL LYMPH NODE BIOPSY;  Surgeon: Rolm Bookbinder, MD;  Location: [facility];  Service: General;  Laterality: Bilateral;  . WRIST SURGERY     left wrist   Family History Family History  Problem Relation Age of Onset  . Stroke Mother     Social History Social History   Tobacco Use  . Smoking status: Never Smoker  . Smokeless tobacco: Current User    Types: Chew  Substance Use Topics  . Alcohol use: Yes    Comment: daily -drinks a quart of beer  . Drug use: No   Allergies Ambien [zolpidem tartrate]  Review of Systems Review of Systems  Constitutional: Negative for fever.  Gastrointestinal: Positive for abdominal pain and nausea. Negative for diarrhea, hematochezia, melena and vomiting.  Genitourinary: Negative for dysuria.   All other systems are reviewed and are negative for acute change except as noted in the HPI  Physical Exam Vital Signs  I have reviewed the triage vital signs BP (!) 145/78   Pulse 79   Temp 98.6 F (37 C) (Oral)   Resp 18   SpO2 100%   Physical Exam  Constitutional: She is oriented to person, place, and time. She appears well-developed and well-nourished. No distress.  HENT:  Head: Normocephalic and atraumatic.  Nose: Nose normal.  Eyes: Conjunctivae and EOM are normal. Pupils are equal, round, and reactive  to light. Right eye exhibits no discharge. Left eye exhibits no discharge. No scleral icterus.  Neck: Normal range of motion. Neck supple.  Cardiovascular: Normal rate and regular rhythm. Exam reveals no gallop and no friction rub.  No murmur heard. Pulmonary/Chest: Effort normal and breath sounds normal. No stridor. No respiratory distress. She has no rales.  Abdominal: Soft. She exhibits no distension. There  is tenderness in the right lower quadrant. There is rebound, guarding and tenderness at McBurney's point. There is no rigidity.  Musculoskeletal: She exhibits no edema or tenderness.  Neurological: She is alert and oriented to person, place, and time.  Skin: Skin is warm and dry. No rash noted. She is not diaphoretic. No erythema.  Psychiatric: She has a normal mood and affect.  Vitals reviewed.   ED Results and Treatments Labs (all labs ordered are listed, but only abnormal results are displayed) Labs Reviewed  COMPREHENSIVE METABOLIC PANEL - Abnormal; Notable for the following components:      Result Value   Sodium 134 (*)    Chloride 97 (*)    Glucose, Bld 114 (*)    ALT 13 (*)    All other components within normal limits  CBC - Abnormal; Notable for the following components:   WBC 13.6 (*)    Hemoglobin 15.1 (*)    All other components within normal limits  CULTURE, BLOOD (ROUTINE X 2)  CULTURE, BLOOD (ROUTINE X 2)  LIPASE, BLOOD  PROTIME-INR  URINALYSIS, ROUTINE W REFLEX MICROSCOPIC                                                                                                                         EKG  EKG Interpretation  Date/Time:  Friday October 05 2017 21:05:51 EST Ventricular Rate:  75 PR Interval:    QRS Duration: 138 QT Interval:  416 QTC Calculation: 465 R Axis:   71 Text Interpretation:  Sinus rhythm Right bundle branch block No significant change since last tracing Confirmed by Addison Lank [phone]) on 10/05/2017 9:13:24 PM      Radiology Ct  Abdomen Pelvis W Contrast  Result Date: 10/05/2017 CLINICAL DATA:  77 y/o F; right lower quadrant pain for 1 day. History of breast cancer. EXAM: CT ABDOMEN AND PELVIS WITH CONTRAST TECHNIQUE: Multidetector CT imaging of the abdomen and pelvis was performed using the standard protocol following bolus administration of intravenous contrast. CONTRAST:  174mL ISOVUE-300 IOPAMIDOL (ISOVUE-300) INJECTION 61% COMPARISON:  08/17/2004 CT abdomen and pelvis. FINDINGS: Lower chest: Moderate hiatal hernia. Mild coronary artery calcification. Hepatobiliary: Renal cysts are present within segment 2 and segment 8 of the liver as seen on prior CT. There is an indeterminate low-density lesion adjacent to the segment 8 cyst measuring 11 mm (series 2, image 17), previously 32 mm. Given interval loss of size this is likely benign. No additional kidney lesion. Cholelithiasis. No biliary ductal dilatation. Pancreas: Unremarkable. No pancreatic ductal dilatation or surrounding inflammatory changes. Spleen: Normal in size without focal abnormality. Adrenals/Urinary Tract: Normal adrenal glands. Multiple small peripheral subcentimeter lucencies, likely cysts. Left kidney interpolar and lower pole stones measuring up to 9 mm. No hydronephrosis or ureter stone identified. Normal bladder. Left ureterovesicular junction is partially obscured by streak artifact from hip hardware. Stomach/Bowel: Normal stomach and small bowel. Scattered colonic diverticulosis. Dilated appendix tip measuring 2.2 x 2.2 x 2.9 cm (AP x ML  x CC series 2, image 74 and series 5, image 40) with surrounding inflammatory change and punctate focus of extraluminal indicating microperforation. Vascular/Lymphatic: Aortic atherosclerosis with moderate calcified plaque. Mild-to-moderate proximal SMA stenosis. No enlarged abdominal or pelvic lymph nodes. Reproductive: Status post hysterectomy. No adnexal masses. Other: No abdominal wall hernia or abnormality. No abdominopelvic  ascites. Musculoskeletal: Left hip replacement. Moderate lumbar spine levocurvature. No acute fracture IMPRESSION: 1. Acute appendicitis with phlegmonous changes of appendix tip. Micro perforation. Appendix: Location: Right lateral to bladder dome. Diameter: Up to 29 mm Appendicolith: Negative Mucosal hyper-enhancement: Positive Extraluminal gas: Positive Periappendiceal collection: Negative 2. Cholelithiasis. 3. Left kidney nephrolithiasis. These results were called by telephone at the time of interpretation on 10/05/2017 at 9:04 pm to Dr. Addison Lank , who verbally acknowledged these results. Electronically Signed   By: Kristine Garbe M.D.   On: 10/05/2017 21:05   Pertinent labs & imaging results that were available during my care of the patient were reviewed by me and considered in my medical decision making (see chart for details).  Medications Ordered in ED Medications  piperacillin-tazobactam (ZOSYN) IVPB 3.375 g (not administered)  fentaNYL (SUBLIMAZE) injection 75 mcg (not administered)  sodium chloride 0.9 % bolus 1,000 mL (not administered)  LORazepam (ATIVAN) injection 0-4 mg (not administered)    Or  LORazepam (ATIVAN) tablet 0-4 mg (not administered)  LORazepam (ATIVAN) injection 0-4 mg (not administered)    Or  LORazepam (ATIVAN) tablet 0-4 mg (not administered)  thiamine (VITAMIN B-1) tablet 100 mg (not administered)    Or  thiamine (B-1) injection 100 mg (not administered)  sodium chloride 0.9 % bolus 1,000 mL (1,000 mLs Intravenous New Bag/Given 10/05/17 1959)  fentaNYL (SUBLIMAZE) injection 75 mcg (75 mcg Intravenous Given 10/05/17 2002)  ondansetron (ZOFRAN) injection 4 mg (4 mg Intravenous Given 10/05/17 2000)  iopamidol (ISOVUE-300) 61 % injection (100 mLs Intravenous Contrast Given 10/05/17 2031)                                                                                                                                    Procedures Procedures CRITICAL  CARE Performed by: Grayce Sessions Lelani Garnett Total critical care time: 35 minutes Critical care time was exclusive of separately billable procedures and treating other patients. Critical care was necessary to treat or prevent imminent or life-threatening deterioration. Critical care was time spent personally by me on the following activities: development of treatment plan with patient and/or surrogate as well as nursing, discussions with consultants, evaluation of patient's response to treatment, examination of patient, obtaining history from patient or surrogate, ordering and performing treatments and interventions, ordering and review of laboratory studies, ordering and review of radiographic studies, pulse oximetry and re-evaluation of patient's condition.   (including critical care time)  Medical Decision Making / ED Course I have reviewed the nursing notes for this encounter and the patient's prior records (if available in EHR or on provided paperwork).  Workup consistent with appendicitis with microperforation and phlegmon of the appendiceal tip.  Empiric antibiotics were initiated.  Patient was treated with IV fluids and pain medicine.  Discuss case with Dr. Redmond Pulling from general surgery who will evaluate the patient for further management.  Patient does not appear to be withdrawing from alcohol but given her history of daily alcohol use CIWA protocol was initiated.  Final Clinical Impression(s) / ED Diagnoses Final diagnoses:  Appendicitis with perforation     This chart was dictated using voice recognition software.  Despite best efforts to proofread,  errors can occur which can change the documentation meaning.   Fatima Blank, MD 10/05/17 2135

## 2017-10-05 NOTE — H&P (Addendum)
[name] is an 77 y.o. female.   Chief Complaint: abdominal pain HPI: 77 yo female with h/o htn, hpl, PAF, alcohol abuse/dependence comes into ED c/o acute onset of RLQ abdominal pain that began after midnight. Pain persistent/constant. Initially thought it was kidney stone. Some nausea but emesis. Last BM today. No f/c. Denies prior abd surgery. Denies tob. Endorses drinking 1 'can' beer per day. Last beer today. No dysuria/hematuria/melena.   Past Medical History:  Diagnosis Date  . Alcohol abuse   . Anemia   . Arthritis   . Bacterial vaginosis   . Cancer ([place]) 01/2015   Breast cancer right   . Cerebrovascular accident (stroke) ([place]) 2/07   left frontal lobe  . Closed left hip fracture ([place]) 5/14  . Complication of anesthesia    woke up too early  with kidney stone surgery and wrist surgery  . Depression   . Dupuytren's contracture   . Fibrocystic breast disease   . Fracture of metatarsal bone of right foot    5th metatarsal  . GERD (gastroesophageal reflux disease)   . Gout   . Headache   . Hyperlipidemia LDL goal <70   . Hypertension   . Paroxysmal A-fib ([place])   . Renal calculus   . Vitamin B12 deficiency   . Vitamin D deficiency     Past Surgical History:  Procedure Laterality Date  . ABDOMINAL HYSTERECTOMY    . cataracts Bilateral   . ESOPHAGEAL MANOMETRY N/A 08/15/2017   Procedure: ESOPHAGEAL MANOMETRY (EM);  Surgeon: Ronnette Juniper, MD;  [facility];  Service: Gastroenterology;  Laterality: N/A;  . ESOPHAGOGASTRODUODENOSCOPY (EGD) WITH PROPOFOL N/A 05/02/2016   Procedure: ESOPHAGOGASTRODUODENOSCOPY (EGD) WITH PROPOFOL;  Surgeon: Garlan Fair, MD;  [facility];  Service: Endoscopy;  Laterality: N/A;  . HIP ARTHROPLASTY Left 12/17/2012   Procedure: LEFT HIP HEMI ARTHROPLASTY ;  Surgeon: Rozanna Box, MD;  Location: [facility];  Service: Orthopedics;  Laterality: Left;  . left wrist surgery surgery  7 yrs ago  . LEG SURGERY Left   . LITHOTRIPSY     . MASTECTOMY W/ SENTINEL NODE BIOPSY Bilateral 01/20/2016   Procedure: BILATERAL TOTAL MASTECTOMY WITH RIGHT SENTINEL LYMPH NODE BIOPSY;  Surgeon: Rolm Bookbinder, MD;  Location: [facility];  Service: General;  Laterality: Bilateral;  . WRIST SURGERY     left wrist    Family History  Problem Relation Age of Onset  . Stroke Mother    Social History:  reports that  has never smoked. Her smokeless tobacco use includes chew. She reports that she drinks alcohol. She reports that she does not use drugs.  Allergies:  Allergies  Allergen Reactions  . Ambien [Zolpidem Tartrate] Other (See Comments)    Causes nightmares     (Not in a hospital admission)  Results for orders placed or performed during the hospital encounter of 10/05/17 (from the past 48 hour(s))  Lipase, blood     Status: None   Collection Time: 10/05/17  5:17 PM  Result Value Ref Range   Lipase 33 11 - 51 U/L    Comment: Performed at [facility], [place] [address] [place], [place] [address]  Comprehensive metabolic panel     Status: Abnormal   Collection Time: 10/05/17  5:17 PM  Result Value Ref Range   Sodium 134 (L) 135 - 145 mmol/L   Potassium 3.9 3.5 - 5.1 mmol/L   Chloride 97 (L) 101 - 111 mmol/L   CO2 24  22 - 32 mmol/L   Glucose, Bld 114 (H) 65 - 99 mg/dL   BUN 8 6 - 20 mg/dL   Creatinine, Ser 0.64 0.44 - 1.00 mg/dL   Calcium 9.0 8.9 - 10.3 mg/dL   Total Protein 7.2 6.5 - 8.1 g/dL   Albumin 3.9 3.5 - 5.0 g/dL   AST 16 15 - 41 U/L   ALT 13 (L) 14 - 54 U/L   Alkaline Phosphatase 80 38 - 126 U/L   Total Bilirubin 0.6 0.3 - 1.2 mg/dL   GFR calc non Af Amer >60 >60 mL/min   GFR calc Af Amer >60 >60 mL/min    Comment: (NOTE) The eGFR has been calculated using the CKD EPI equation. This calculation has not been validated in all clinical situations. eGFR's persistently <60 mL/min signify possible Chronic Kidney Disease.    Anion gap 13 5 - 15    Comment: Performed at [facility], [place] [address] [place], [place] [address]  CBC     Status: Abnormal   Collection Time: 10/05/17  5:17 PM  Result Value Ref Range   WBC 13.6 (H) 4.0 - 10.5 K/uL   RBC 4.77 3.87 - 5.11 MIL/uL   Hemoglobin 15.1 (H) 12.0 - 15.0 g/dL   HCT 45.7 36.0 - 46.0 %   MCV 95.8 78.0 - 100.0 fL   MCH 31.7 26.0 - 34.0 pg   MCHC 33.0 30.0 - 36.0 g/dL   RDW 12.3 11.5 - 15.5 %   Platelets 317 150 - 400 K/uL    Comment: Performed at [facility], [place] [address] [place], [place] [address]  Protime-INR     Status: None   Collection Time: 10/05/17  8:11 PM  Result Value Ref Range   Prothrombin Time 12.6 11.4 - 15.2 seconds   INR 0.96     Comment: Performed at [facility], [place] [address] [place], [place] [address]   Ct Abdomen Pelvis W Contrast  Result Date: 10/05/2017 CLINICAL DATA:  77 y/o F; right lower quadrant pain for 1 day. History of breast cancer. EXAM: CT ABDOMEN AND PELVIS WITH CONTRAST TECHNIQUE: Multidetector CT imaging of the abdomen and pelvis was performed using the standard protocol following bolus administration of intravenous contrast. CONTRAST:  161m ISOVUE-300 IOPAMIDOL (ISOVUE-300) INJECTION 61% COMPARISON:  08/17/2004 CT abdomen and pelvis. FINDINGS: Lower chest: Moderate hiatal hernia. Mild coronary artery calcification. Hepatobiliary: Renal cysts are present within segment 2 and segment 8 of the liver as seen on prior CT. There is an indeterminate low-density lesion adjacent to the segment 8 cyst measuring 11 mm (series 2, image 17), previously 32 mm. Given interval loss of size this is likely benign. No additional kidney lesion. Cholelithiasis. No biliary ductal dilatation. Pancreas: Unremarkable. No pancreatic ductal dilatation or surrounding inflammatory changes. Spleen: Normal in size without focal abnormality. Adrenals/Urinary Tract: Normal adrenal glands. Multiple small peripheral subcentimeter lucencies, likely cysts. Left kidney  interpolar and lower pole stones measuring up to 9 mm. No hydronephrosis or ureter stone identified. Normal bladder. Left ureterovesicular junction is partially obscured by streak artifact from hip hardware. Stomach/Bowel: Normal stomach and small bowel. Scattered colonic diverticulosis. Dilated appendix tip measuring 2.2 x 2.2 x 2.9 cm (AP x ML x CC series 2, image 74 and series 5, image 40) with surrounding inflammatory change and punctate focus of extraluminal indicating microperforation. Vascular/Lymphatic: Aortic atherosclerosis with moderate calcified plaque. Mild-to-moderate proximal SMA stenosis. No enlarged abdominal or pelvic lymph nodes. Reproductive:  Status post hysterectomy. No adnexal masses. Other: No abdominal wall hernia or abnormality. No abdominopelvic ascites. Musculoskeletal: Left hip replacement. Moderate lumbar spine levocurvature. No acute fracture IMPRESSION: 1. Acute appendicitis with phlegmonous changes of appendix tip. Micro perforation. Appendix: Location: Right lateral to bladder dome. Diameter: Up to 29 mm Appendicolith: Negative Mucosal hyper-enhancement: Positive Extraluminal gas: Positive Periappendiceal collection: Negative 2. Cholelithiasis. 3. Left kidney nephrolithiasis. These results were called by telephone at the time of interpretation on 10/05/2017 at 9:04 pm to Dr. Addison Lank , who verbally acknowledged these results. Electronically Signed   By: Kristine Garbe M.D.   On: 10/05/2017 21:05    Review of Systems  Constitutional: Negative for weight loss.  HENT: Negative for nosebleeds.   Eyes: Negative for blurred vision.  Respiratory: Positive for shortness of breath.   Cardiovascular: Negative for chest pain, palpitations, orthopnea and PND.       Denies DOE  Gastrointestinal: Positive for abdominal pain and nausea. Negative for blood in stool and constipation.  Genitourinary: Negative for dysuria and hematuria.  Musculoskeletal: Positive for falls.   Skin: Negative for itching and rash.  Neurological: Negative for dizziness, focal weakness, seizures, loss of consciousness and headaches.       Denies TIAs, amaurosis fugax  Endo/Heme/Allergies: Does not bruise/bleed easily.  Psychiatric/Behavioral: Positive for substance abuse (daily alcohol use). The patient is not nervous/anxious.     Blood pressure 139/79, pulse 74, temperature 98.6 F (37 C), temperature source Oral, resp. rate 18, SpO2 100 %. Physical Exam  Vitals reviewed. Constitutional: She appears well-developed. No distress.  Older than stated age. Nontoxic. Appears chronically ill; sleepy  HENT:  Head: Normocephalic and atraumatic.  Right Ear: External ear normal.  Left Ear: External ear normal.  Eyes: Conjunctivae are normal. No scleral icterus.  Muddy sclera  Neck: Normal range of motion. Neck supple. No tracheal deviation present. No thyromegaly present.  Cardiovascular: Normal rate, normal heart sounds and intact distal pulses.  Respiratory: Effort normal and breath sounds normal. No stridor. No respiratory distress. She has no wheezes.  GI: Soft. She exhibits no distension and no mass. There is tenderness. There is guarding (voluntary; RLQ). There is no rebound.  Musculoskeletal: She exhibits no edema or tenderness.  Lymphadenopathy:    She has no cervical adenopathy.  Neurological: She exhibits normal muscle tone. GCS eye subscore is 4. GCS verbal subscore is 5. GCS motor subscore is 6.  Takes a few seconds to list date, time, year, etc; Feb 2019, [place], [place]; "bill clinton"  Skin: Skin is warm and dry. No rash noted. She is not diaphoretic. No erythema. No pallor.  Psychiatric: She has a normal mood and affect. Her behavior is normal. Judgment and thought content normal.     Assessment/Plan Acute appendicitis probable perforated  HTN HPL paroxymal atrial fib Alcohol abuse vs dependence Osteoporosis GERD  We discussed the etiology and management of acute  appendicitis. We discussed operative and nonoperative management.  I recommended operative management along with IV antibiotics.  We discussed laparoscopic appendectomy. We discussed the risk and benefits of surgery including but not limited to bleeding, infection, injury to surrounding structures, need to convert to an open procedure, blood clot formation, post operative abscess or wound infection, staple line complications such as leak or bleeding, hernia formation, post operative ileus, need for additional procedures, anesthesia complications, and the typical postoperative course. I explained that the patient should expect a good improvement in their symptoms.  CIWA IV ABX Banana bag; got IV  thiamine in ED scds Home BP meds  Leighton Ruff. Redmond Pulling, MD, FACS General, Bariatric, & Minimally Invasive Surgery [facility], [place]   Greer Pickerel, MD 10/05/2017, 10:19 PM

## 2017-10-05 NOTE — ED Notes (Signed)
ED TO INPATIENT HANDOFF REPORT  Name/Age/Gender [name] 77 y.o. female  Code Status    Code Status Orders  (From admission, onward)        Start     Ordered   10/05/17 2128  Full code  Continuous     10/05/17 2130    Code Status History    Date Active Date Inactive Code Status Order ID Comments User Context   04/10/2017 20:05 04/12/2017 15:53 Full Code [id number]  Krishnan, Gokul, MD Inpatient   01/20/2016 16:25 01/22/2016 17:11 Full Code [id number]  Wakefield, Matthew, MD Inpatient   06/04/2014 21:34 06/06/2014 19:33 Full Code [id number]  Hijazi, Ali, MD Inpatient      Home/SNF/Other Home  Chief Complaint right flank pain  Level of Care/Admitting Diagnosis ED Disposition    None      Medical History Past Medical History:  Diagnosis Date  . Alcohol abuse   . Anemia   . Arthritis   . Bacterial vaginosis   . Cancer (HCC) 01/2015   Breast cancer right   . Cerebrovascular accident (stroke) (HCC) 2/07   left frontal lobe  . Closed left hip fracture (HCC) 5/14  . Complication of anesthesia    woke up too early  with kidney stone surgery and wrist surgery  . Depression   . Dupuytren's contracture   . Fibrocystic breast disease   . Fracture of metatarsal bone of right foot    5th metatarsal  . GERD (gastroesophageal reflux disease)   . Gout   . Headache   . Hyperlipidemia LDL goal <70   . Hypertension   . Paroxysmal A-fib (HCC)   . Renal calculus   . Vitamin B12 deficiency   . Vitamin D deficiency     Allergies Allergies  Allergen Reactions  . Ambien [Zolpidem Tartrate] Other (See Comments)    Causes nightmares    IV Location/Drains/Wounds Patient Lines/Drains/Airways Status   Active Line/Drains/Airways    Name:   Placement date:   Placement time:   Site:   Days:   Peripheral IV 10/05/17 Left Forearm   10/05/17    1957    Forearm   less than 1   Peripheral IV 10/05/17 Right;Upper Arm   10/05/17    2140    Arm   less than 1           Labs/Imaging Results for orders placed or performed during the hospital encounter of 10/05/17 (from the past 48 hour(s))  Lipase, blood     Status: None   Collection Time: 10/05/17  5:17 PM  Result Value Ref Range   Lipase 33 11 - 51 U/L    Comment: Performed at [facility], [address] [place], [place] [address]  Comprehensive metabolic panel     Status: Abnormal   Collection Time: 10/05/17  5:17 PM  Result Value Ref Range   Sodium 134 (L) 135 - 145 mmol/L   Potassium 3.9 3.5 - 5.1 mmol/L   Chloride 97 (L) 101 - 111 mmol/L   CO2 24 22 - 32 mmol/L   Glucose, Bld 114 (H) 65 - 99 mg/dL   BUN 8 6 - 20 mg/dL   Creatinine, Ser 0.64 0.44 - 1.00 mg/dL   Calcium 9.0 8.9 - 10.3 mg/dL   Total Protein 7.2 6.5 - 8.1 g/dL   Albumin 3.9 3.5 - 5.0 g/dL   AST 16 15 - 41 U/L   ALT 13 (L) 14 - 54 U/L     Alkaline Phosphatase 80 38 - 126 U/L   Total Bilirubin 0.6 0.3 - 1.2 mg/dL   GFR calc non Af Amer >60 >60 mL/min   GFR calc Af Amer >60 >60 mL/min    Comment: (NOTE) The eGFR has been calculated using the CKD EPI equation. This calculation has not been validated in all clinical situations. eGFR's persistently <60 mL/min signify possible Chronic Kidney Disease.    Anion gap 13 5 - 15    Comment: Performed at [facility], [address] [place], [place] [address]  CBC     Status: Abnormal   Collection Time: 10/05/17  5:17 PM  Result Value Ref Range   WBC 13.6 (H) 4.0 - 10.5 K/uL   RBC 4.77 3.87 - 5.11 MIL/uL   Hemoglobin 15.1 (H) 12.0 - 15.0 g/dL   HCT 45.7 36.0 - 46.0 %   MCV 95.8 78.0 - 100.0 fL   MCH 31.7 26.0 - 34.0 pg   MCHC 33.0 30.0 - 36.0 g/dL   RDW 12.3 11.5 - 15.5 %   Platelets 317 150 - 400 K/uL    Comment: Performed at [facility], [address] [place], [place] [address]  Protime-INR     Status: None   Collection Time: 10/05/17  8:11 PM  Result Value Ref Range   Prothrombin Time 12.6 11.4 - 15.2 seconds    INR 0.96     Comment: Performed at [facility], [address] [place],  [address]   Ct Abdomen Pelvis W Contrast  Result Date: 10/05/2017 CLINICAL DATA:  77 y/o F; right lower quadrant pain for 1 day. History of breast cancer. EXAM: CT ABDOMEN AND PELVIS WITH CONTRAST TECHNIQUE: Multidetector CT imaging of the abdomen and pelvis was performed using the standard protocol following bolus administration of intravenous contrast. CONTRAST:  100mL ISOVUE-300 IOPAMIDOL (ISOVUE-300) INJECTION 61% COMPARISON:  08/17/2004 CT abdomen and pelvis. FINDINGS: Lower chest: Moderate hiatal hernia. Mild coronary artery calcification. Hepatobiliary: Renal cysts are present within segment 2 and segment 8 of the liver as seen on prior CT. There is an indeterminate low-density lesion adjacent to the segment 8 cyst measuring 11 mm (series 2, image 17), previously 32 mm. Given interval loss of size this is likely benign. No additional kidney lesion. Cholelithiasis. No biliary ductal dilatation. Pancreas: Unremarkable. No pancreatic ductal dilatation or surrounding inflammatory changes. Spleen: Normal in size without focal abnormality. Adrenals/Urinary Tract: Normal adrenal glands. Multiple small peripheral subcentimeter lucencies, likely cysts. Left kidney interpolar and lower pole stones measuring up to 9 mm. No hydronephrosis or ureter stone identified. Normal bladder. Left ureterovesicular junction is partially obscured by streak artifact from hip hardware. Stomach/Bowel: Normal stomach and small bowel. Scattered colonic diverticulosis. Dilated appendix tip measuring 2.2 x 2.2 x 2.9 cm (AP x ML x CC series 2, image 74 and series 5, image 40) with surrounding inflammatory change and punctate focus of extraluminal indicating microperforation. Vascular/Lymphatic: Aortic atherosclerosis with moderate calcified plaque. Mild-to-moderate proximal SMA stenosis. No enlarged abdominal or pelvic lymph nodes.  Reproductive: Status post hysterectomy. No adnexal masses. Other: No abdominal wall hernia or abnormality. No abdominopelvic ascites. Musculoskeletal: Left hip replacement. Moderate lumbar spine levocurvature. No acute fracture IMPRESSION: 1. Acute appendicitis with phlegmonous changes of appendix tip. Micro perforation. Appendix: Location: Right lateral to bladder dome. Diameter: Up to 29 mm Appendicolith: Negative Mucosal hyper-enhancement: Positive Extraluminal gas: Positive Periappendiceal collection: Negative 2. Cholelithiasis. 3. Left kidney nephrolithiasis. These results were called by telephone at   the time of interpretation on 10/05/2017 at 9:04 pm to Dr. Addison Lank , who verbally acknowledged these results. Electronically Signed   By: Kristine Garbe M.D.   On: 10/05/2017 21:05    Pending Labs Unresulted Labs (From admission, onward)   Start     Ordered   10/05/17 2113  Blood culture (routine x 2)  BLOOD CULTURE X 2,   STAT     10/05/17 2112   10/05/17 1650  Urinalysis, Routine w reflex microscopic  STAT,   STAT     10/05/17 1649      Vitals/Pain Today's Vitals   10/05/17 2149 10/05/17 2155 10/05/17 2224 10/05/17 2225  BP: 139/79  134/75   Pulse: 74  76   Resp:   18   Temp:   98.4 F (36.9 C)   TempSrc:   Oral   SpO2:   100%   PainSc:  8   6     Isolation Precautions No active isolations  Medications Medications  piperacillin-tazobactam (ZOSYN) IVPB 3.375 g (3.375 g Intravenous New Bag/Given 10/05/17 2200)  LORazepam (ATIVAN) injection 0-4 mg (1 mg Intravenous Given 10/05/17 2158)    Or  LORazepam (ATIVAN) tablet 0-4 mg ( Oral See Alternative 10/05/17 2158)  LORazepam (ATIVAN) injection 0-4 mg (not administered)    Or  LORazepam (ATIVAN) tablet 0-4 mg (not administered)  thiamine (VITAMIN B-1) tablet 100 mg ( Oral See Alternative 10/05/17 2202)    Or  thiamine (B-1) injection 100 mg (100 mg Intravenous Given 10/05/17 2202)  sodium chloride 0.9 % bolus 1,000 mL  (0 mLs Intravenous Stopped 10/05/17 2156)  fentaNYL (SUBLIMAZE) injection 75 mcg (75 mcg Intravenous Given 10/05/17 2002)  ondansetron (ZOFRAN) injection 4 mg (4 mg Intravenous Given 10/05/17 2000)  iopamidol (ISOVUE-300) 61 % injection (100 mLs Intravenous Contrast Given 10/05/17 2031)  fentaNYL (SUBLIMAZE) injection 75 mcg (75 mcg Intravenous Given 10/05/17 2157)  sodium chloride 0.9 % bolus 1,000 mL (1,000 mLs Intravenous New Bag/Given 10/05/17 2156)    Mobility walks

## 2017-10-06 ENCOUNTER — Other Ambulatory Visit: Payer: Self-pay

## 2017-10-06 DIAGNOSIS — K3580 Unspecified acute appendicitis: Secondary | ICD-10-CM | POA: Diagnosis present

## 2017-10-06 LAB — BASIC METABOLIC PANEL
Anion gap: 11 (ref 5–15)
BUN: 7 mg/dL (ref 6–20)
CALCIUM: 7.8 mg/dL — AB (ref 8.9–10.3)
CO2: 21 mmol/L — AB (ref 22–32)
Chloride: 103 mmol/L (ref 101–111)
Creatinine, Ser: 0.75 mg/dL (ref 0.44–1.00)
Glucose, Bld: 117 mg/dL — ABNORMAL HIGH (ref 65–99)
POTASSIUM: 3.9 mmol/L (ref 3.5–5.1)
Sodium: 135 mmol/L (ref 135–145)

## 2017-10-06 LAB — CBC
HEMATOCRIT: 39.9 % (ref 36.0–46.0)
Hemoglobin: 13 g/dL (ref 12.0–15.0)
MCH: 31.6 pg (ref 26.0–34.0)
MCHC: 32.6 g/dL (ref 30.0–36.0)
MCV: 97.1 fL (ref 78.0–100.0)
PLATELETS: 260 10*3/uL (ref 150–400)
RBC: 4.11 MIL/uL (ref 3.87–5.11)
RDW: 12.6 % (ref 11.5–15.5)
WBC: 17.5 10*3/uL — ABNORMAL HIGH (ref 4.0–10.5)

## 2017-10-06 MED ORDER — OXYCODONE HCL 5 MG PO TABS
2.5000 mg | ORAL_TABLET | ORAL | Status: DC | PRN
  Administered 2017-10-06 – 2017-10-08 (×4): 5 mg via ORAL
  Filled 2017-10-06 (×4): qty 1

## 2017-10-06 MED ORDER — PIPERACILLIN-TAZOBACTAM 3.375 G IVPB
3.3750 g | Freq: Three times a day (TID) | INTRAVENOUS | Status: AC
  Administered 2017-10-06: 3.375 g via INTRAVENOUS
  Filled 2017-10-06: qty 50

## 2017-10-06 MED ORDER — AMLODIPINE BESYLATE 5 MG PO TABS
5.0000 mg | ORAL_TABLET | Freq: Every day | ORAL | Status: DC
  Administered 2017-10-06 – 2017-10-11 (×4): 5 mg via ORAL
  Filled 2017-10-06 (×6): qty 1

## 2017-10-06 MED ORDER — ACETAMINOPHEN 325 MG PO TABS
650.0000 mg | ORAL_TABLET | Freq: Four times a day (QID) | ORAL | Status: AC
  Administered 2017-10-06 – 2017-10-07 (×7): 650 mg via ORAL
  Filled 2017-10-06 (×8): qty 2

## 2017-10-06 MED ORDER — FOLIC ACID 1 MG PO TABS
1.0000 mg | ORAL_TABLET | Freq: Every day | ORAL | Status: DC
  Administered 2017-10-06 – 2017-10-11 (×6): 1 mg via ORAL
  Filled 2017-10-06 (×6): qty 1

## 2017-10-06 MED ORDER — MORPHINE SULFATE (PF) 2 MG/ML IV SOLN: 1.0000 mg | INTRAVENOUS | Status: DC | PRN

## 2017-10-06 MED ORDER — ONDANSETRON HCL 4 MG/2ML IJ SOLN
INTRAMUSCULAR | Status: DC | PRN
  Administered 2017-10-05: 4 mg via INTRAVENOUS

## 2017-10-06 MED ORDER — DIPHENHYDRAMINE HCL 50 MG/ML IJ SOLN: 12.5000 mg | Freq: Four times a day (QID) | INTRAMUSCULAR | Status: DC | PRN

## 2017-10-06 MED ORDER — DIPHENHYDRAMINE HCL 12.5 MG/5ML PO ELIX: 12.5000 mg | ORAL_SOLUTION | Freq: Four times a day (QID) | ORAL | Status: DC | PRN

## 2017-10-06 MED ORDER — ONDANSETRON HCL 4 MG/2ML IJ SOLN
4.0000 mg | Freq: Four times a day (QID) | INTRAMUSCULAR | Status: DC | PRN
  Administered 2017-10-07 – 2017-10-09 (×2): 4 mg via INTRAVENOUS
  Filled 2017-10-06 (×2): qty 2

## 2017-10-06 MED ORDER — VITAMIN B-1 100 MG PO TABS
100.0000 mg | ORAL_TABLET | Freq: Every day | ORAL | Status: DC
  Administered 2017-10-06 – 2017-10-11 (×5): 100 mg via ORAL
  Filled 2017-10-06 (×6): qty 1

## 2017-10-06 MED ORDER — THIAMINE HCL 100 MG/ML IJ SOLN
100.0000 mg | Freq: Every day | INTRAMUSCULAR | Status: DC
  Filled 2017-10-06: qty 2

## 2017-10-06 MED ORDER — LORAZEPAM 2 MG/ML IJ SOLN: 1.0000 mg | Freq: Four times a day (QID) | INTRAMUSCULAR | Status: AC | PRN

## 2017-10-06 MED ORDER — ATENOLOL 25 MG PO TABS
25.0000 mg | ORAL_TABLET | Freq: Every day | ORAL | Status: DC
  Administered 2017-10-06 – 2017-10-11 (×4): 25 mg via ORAL
  Filled 2017-10-06 (×6): qty 1

## 2017-10-06 MED ORDER — GABAPENTIN 100 MG PO CAPS
100.0000 mg | ORAL_CAPSULE | Freq: Two times a day (BID) | ORAL | Status: DC
  Administered 2017-10-06 – 2017-10-11 (×12): 100 mg via ORAL
  Filled 2017-10-06 (×12): qty 1

## 2017-10-06 MED ORDER — ADULT MULTIVITAMIN W/MINERALS CH
1.0000 | ORAL_TABLET | Freq: Every day | ORAL | Status: DC
  Administered 2017-10-06 – 2017-10-11 (×6): 1 via ORAL
  Filled 2017-10-06 (×6): qty 1

## 2017-10-06 MED ORDER — LORAZEPAM 1 MG PO TABS: 1.0000 mg | ORAL_TABLET | Freq: Four times a day (QID) | ORAL | Status: AC | PRN

## 2017-10-06 MED ORDER — PANTOPRAZOLE SODIUM 40 MG PO TBEC
40.0000 mg | DELAYED_RELEASE_TABLET | Freq: Every day | ORAL | Status: DC
  Administered 2017-10-06 – 2017-10-11 (×6): 40 mg via ORAL
  Filled 2017-10-06 (×6): qty 1

## 2017-10-06 MED ORDER — ENOXAPARIN SODIUM 40 MG/0.4ML ~~LOC~~ SOLN
40.0000 mg | Freq: Every day | SUBCUTANEOUS | Status: DC
  Administered 2017-10-06 – 2017-10-10 (×5): 40 mg via SUBCUTANEOUS
  Filled 2017-10-06 (×7): qty 0.4

## 2017-10-06 MED ORDER — POTASSIUM CHLORIDE IN NACL 20-0.9 MEQ/L-% IV SOLN
INTRAVENOUS | Status: DC
  Administered 2017-10-06: 1000 mL via INTRAVENOUS
  Administered 2017-10-07: 01:00:00 via INTRAVENOUS
  Administered 2017-10-09: 1000 mL via INTRAVENOUS
  Filled 2017-10-06 (×7): qty 1000

## 2017-10-06 MED ORDER — DOCUSATE SODIUM 100 MG PO CAPS
100.0000 mg | ORAL_CAPSULE | Freq: Two times a day (BID) | ORAL | Status: DC
  Administered 2017-10-06 – 2017-10-11 (×11): 100 mg via ORAL
  Filled 2017-10-06 (×11): qty 1

## 2017-10-06 MED ORDER — ONDANSETRON 4 MG PO TBDP
4.0000 mg | ORAL_TABLET | Freq: Four times a day (QID) | ORAL | Status: DC | PRN
  Administered 2017-10-07 – 2017-10-10 (×3): 4 mg via ORAL
  Filled 2017-10-06 (×3): qty 1

## 2017-10-06 NOTE — Progress Notes (Signed)
1 Day Post-Op   Subjective/Chief Complaint: Doing ok    Objective: Vital signs in last 24 hours: Temp:  [98.4 F (36.9 C)-99.2 F (37.3 C)] 99.2 F (37.3 C) (02/23 0430) Pulse Rate:  [69-85] 72 (02/23 0430) Resp:  [14-22] 18 (02/23 0430) BP: (96-170)/(58-118) 106/64 (02/23 0430) SpO2:  [91 %-100 %] 98 % (02/23 0430) Last BM Date: 10/05/17  Intake/Output from previous day: 02/22 0701 - 02/23 0700 In: 2230 [P.O.:50; I.V.:1130; IV Piggyback:1050] Out: 515 [Urine:500; Blood:15] Intake/Output this shift: No intake/output data recorded.  Incision/Wound: CDI soft tender around the incision   Lab Results:  Recent Labs    10/05/17 1717 10/06/17 0521  WBC 13.6* 17.5*  HGB 15.1* 13.0  HCT 45.7 39.9  PLT 317 260   BMET Recent Labs    10/05/17 1717 10/06/17 0521  NA 134* 135  K 3.9 3.9  CL 97* 103  CO2 24 21*  GLUCOSE 114* 117*  BUN 8 7  CREATININE 0.64 0.75  CALCIUM 9.0 7.8*   PT/INR Recent Labs    10/05/17 2011  LABPROT 12.6  INR 0.96   ABG No results for input(s): PHART, HCO3 in the last 72 hours.  Invalid input(s): PCO2, PO2  Studies/Results: Ct Abdomen Pelvis W Contrast  Result Date: 10/05/2017 CLINICAL DATA:  77 y/o F; right lower quadrant pain for 1 day. History of breast cancer. EXAM: CT ABDOMEN AND PELVIS WITH CONTRAST TECHNIQUE: Multidetector CT imaging of the abdomen and pelvis was performed using the standard protocol following bolus administration of intravenous contrast. CONTRAST:  170mL ISOVUE-300 IOPAMIDOL (ISOVUE-300) INJECTION 61% COMPARISON:  08/17/2004 CT abdomen and pelvis. FINDINGS: Lower chest: Moderate hiatal hernia. Mild coronary artery calcification. Hepatobiliary: Renal cysts are present within segment 2 and segment 8 of the liver as seen on prior CT. There is an indeterminate low-density lesion adjacent to the segment 8 cyst measuring 11 mm (series 2, image 17), previously 32 mm. Given interval loss of size this is likely benign. No  additional kidney lesion. Cholelithiasis. No biliary ductal dilatation. Pancreas: Unremarkable. No pancreatic ductal dilatation or surrounding inflammatory changes. Spleen: Normal in size without focal abnormality. Adrenals/Urinary Tract: Normal adrenal glands. Multiple small peripheral subcentimeter lucencies, likely cysts. Left kidney interpolar and lower pole stones measuring up to 9 mm. No hydronephrosis or ureter stone identified. Normal bladder. Left ureterovesicular junction is partially obscured by streak artifact from hip hardware. Stomach/Bowel: Normal stomach and small bowel. Scattered colonic diverticulosis. Dilated appendix tip measuring 2.2 x 2.2 x 2.9 cm (AP x ML x CC series 2, image 74 and series 5, image 40) with surrounding inflammatory change and punctate focus of extraluminal indicating microperforation. Vascular/Lymphatic: Aortic atherosclerosis with moderate calcified plaque. Mild-to-moderate proximal SMA stenosis. No enlarged abdominal or pelvic lymph nodes. Reproductive: Status post hysterectomy. No adnexal masses. Other: No abdominal wall hernia or abnormality. No abdominopelvic ascites. Musculoskeletal: Left hip replacement. Moderate lumbar spine levocurvature. No acute fracture IMPRESSION: 1. Acute appendicitis with phlegmonous changes of appendix tip. Micro perforation. Appendix: Location: Right lateral to bladder dome. Diameter: Up to 29 mm Appendicolith: Negative Mucosal hyper-enhancement: Positive Extraluminal gas: Positive Periappendiceal collection: Negative 2. Cholelithiasis. 3. Left kidney nephrolithiasis. These results were called by telephone at the time of interpretation on 10/05/2017 at 9:04 pm to Dr. Addison Lank , who verbally acknowledged these results. Electronically Signed   By: Kristine Garbe M.D.   On: 10/05/2017 21:05    Anti-infectives: Anti-infectives (From admission, onward)   Start     Dose/Rate Route Frequency  Ordered Stop   10/06/17 0600   piperacillin-tazobactam (ZOSYN) IVPB 3.375 g     3.375 g 12.5 mL/hr over 240 Minutes Intravenous Every 8 hours 10/06/17 0218 10/06/17 0903   10/05/17 2130  piperacillin-tazobactam (ZOSYN) IVPB 3.375 g     3.375 g 100 mL/hr over 30 Minutes Intravenous  Once 10/05/17 2125 10/05/17 2230      Assessment/Plan: s/p Procedure(s): APPENDECTOMY LAPAROSCOPIC (N/A) Advance  Diet  Ambulate  CIWA SL IV  Home sunday  LOS: 0 days    Joyice Faster Terrisa Curfman 10/06/2017

## 2017-10-06 NOTE — Anesthesia Procedure Notes (Signed)
Date/Time: 10/05/2017 11:59 PM Performed by: Cynda Familia, CRNA Oxygen Delivery Method: Simple face mask Placement Confirmation: positive ETCO2 and breath sounds checked- equal and bilateral Dental Injury: Teeth and Oropharynx as per pre-operative assessment

## 2017-10-06 NOTE — Transfer of Care (Signed)
Immediate Anesthesia Transfer of Care Note  Patient: [name]  Procedure(s) Performed: APPENDECTOMY LAPAROSCOPIC (N/A Abdomen)  Patient Location: PACU  Anesthesia Type:General  Level of Consciousness: sedated  Airway & Oxygen Therapy: Patient Spontanous Breathing and Patient connected to face mask oxygen  Post-op Assessment: Report given to RN and Post -op Vital signs reviewed and stable  Post vital signs: Reviewed and stable  Last Vitals:  Vitals:   10/06/17 0000 10/06/17 0006  BP:  136/68  Pulse:  71  Resp:  (!) 22  Temp: 36.9 C   SpO2:  100%    Last Pain:  Vitals:   10/05/17 2225  TempSrc:   PainSc: 6          Complications: No apparent anesthesia complications

## 2017-10-07 LAB — TROPONIN I

## 2017-10-07 MED ORDER — GI COCKTAIL ~~LOC~~
30.0000 mL | Freq: Once | ORAL | Status: AC
  Administered 2017-10-07: 30 mL via ORAL
  Filled 2017-10-07: qty 30

## 2017-10-07 MED ORDER — ASPIRIN EC 81 MG PO TBEC
81.0000 mg | DELAYED_RELEASE_TABLET | Freq: Once | ORAL | Status: AC
  Administered 2017-10-07: 81 mg via ORAL
  Filled 2017-10-07: qty 1

## 2017-10-07 NOTE — Plan of Care (Signed)
  Safety: Ability to remain free from injury will improve 10/07/2017 2052 - Progressing by Mickie Kay, RN

## 2017-10-07 NOTE — Progress Notes (Signed)
No urine output in 7hrs. I&O cath done. 300cc

## 2017-10-07 NOTE — Progress Notes (Signed)
2 Days Post-Op   Subjective/Chief Complaint: Not getting out of bed  Not very motivated    Objective: Vital signs in last 24 hours: Temp:  [98.4 F (36.9 C)-100.7 F (38.2 C)] 98.5 F (36.9 C) (02/24 0519) Pulse Rate:  [63-64] 64 (02/24 0519) Resp:  [17-18] 18 (02/24 0519) BP: (98-105)/(52-60) 105/60 (02/24 0922) SpO2:  [93 %-100 %] 93 % (02/24 0519) Last BM Date: 10/05/17  Intake/Output from previous day: 02/23 0701 - 02/24 0700 In: 1540 [P.O.:340; I.V.:1200] Out: 300 [Urine:300] Intake/Output this shift: No intake/output data recorded.  Incision/Wound:CDI soft ND no peritonitis   Lab Results:  Recent Labs    10/05/17 1717 10/06/17 0521  WBC 13.6* 17.5*  HGB 15.1* 13.0  HCT 45.7 39.9  PLT 317 260   BMET Recent Labs    10/05/17 1717 10/06/17 0521  NA 134* 135  K 3.9 3.9  CL 97* 103  CO2 24 21*  GLUCOSE 114* 117*  BUN 8 7  CREATININE 0.64 0.75  CALCIUM 9.0 7.8*   PT/INR Recent Labs    10/05/17 2011  LABPROT 12.6  INR 0.96   ABG No results for input(s): PHART, HCO3 in the last 72 hours.  Invalid input(s): PCO2, PO2  Studies/Results: Ct Abdomen Pelvis W Contrast  Result Date: 10/05/2017 CLINICAL DATA:  77 y/o F; right lower quadrant pain for 1 day. History of breast cancer. EXAM: CT ABDOMEN AND PELVIS WITH CONTRAST TECHNIQUE: Multidetector CT imaging of the abdomen and pelvis was performed using the standard protocol following bolus administration of intravenous contrast. CONTRAST:  184mL ISOVUE-300 IOPAMIDOL (ISOVUE-300) INJECTION 61% COMPARISON:  08/17/2004 CT abdomen and pelvis. FINDINGS: Lower chest: Moderate hiatal hernia. Mild coronary artery calcification. Hepatobiliary: Renal cysts are present within segment 2 and segment 8 of the liver as seen on prior CT. There is an indeterminate low-density lesion adjacent to the segment 8 cyst measuring 11 mm (series 2, image 17), previously 32 mm. Given interval loss of size this is likely benign. No  additional kidney lesion. Cholelithiasis. No biliary ductal dilatation. Pancreas: Unremarkable. No pancreatic ductal dilatation or surrounding inflammatory changes. Spleen: Normal in size without focal abnormality. Adrenals/Urinary Tract: Normal adrenal glands. Multiple small peripheral subcentimeter lucencies, likely cysts. Left kidney interpolar and lower pole stones measuring up to 9 mm. No hydronephrosis or ureter stone identified. Normal bladder. Left ureterovesicular junction is partially obscured by streak artifact from hip hardware. Stomach/Bowel: Normal stomach and small bowel. Scattered colonic diverticulosis. Dilated appendix tip measuring 2.2 x 2.2 x 2.9 cm (AP x ML x CC series 2, image 74 and series 5, image 40) with surrounding inflammatory change and punctate focus of extraluminal indicating microperforation. Vascular/Lymphatic: Aortic atherosclerosis with moderate calcified plaque. Mild-to-moderate proximal SMA stenosis. No enlarged abdominal or pelvic lymph nodes. Reproductive: Status post hysterectomy. No adnexal masses. Other: No abdominal wall hernia or abnormality. No abdominopelvic ascites. Musculoskeletal: Left hip replacement. Moderate lumbar spine levocurvature. No acute fracture IMPRESSION: 1. Acute appendicitis with phlegmonous changes of appendix tip. Micro perforation. Appendix: Location: Right lateral to bladder dome. Diameter: Up to 29 mm Appendicolith: Negative Mucosal hyper-enhancement: Positive Extraluminal gas: Positive Periappendiceal collection: Negative 2. Cholelithiasis. 3. Left kidney nephrolithiasis. These results were called by telephone at the time of interpretation on 10/05/2017 at 9:04 pm to Dr. Addison Lank , who verbally acknowledged these results. Electronically Signed   By: Kristine Garbe M.D.   On: 10/05/2017 21:05    Anti-infectives: Anti-infectives (From admission, onward)   Start     Dose/Rate  Route Frequency Ordered Stop   10/06/17 0600   piperacillin-tazobactam (ZOSYN) IVPB 3.375 g     3.375 g 12.5 mL/hr over 240 Minutes Intravenous Every 8 hours 10/06/17 0218 10/06/17 0903   10/05/17 2130  piperacillin-tazobactam (ZOSYN) IVPB 3.375 g     3.375 g 100 mL/hr over 30 Minutes Intravenous  Once 10/05/17 2125 10/05/17 2230      Assessment/Plan: s/p Procedure(s): APPENDECTOMY LAPAROSCOPIC (N/A) Needs to move more  Hopefully home Monday    LOS: 0 days    [name] 10/07/2017

## 2017-10-07 NOTE — Anesthesia Postprocedure Evaluation (Signed)
Anesthesia Post Note  Patient: [name]  Procedure(s) Performed: APPENDECTOMY LAPAROSCOPIC (N/A Abdomen)     Patient location during evaluation: PACU Anesthesia Type: General Level of consciousness: awake and alert Pain management: pain level controlled Vital Signs Assessment: post-procedure vital signs reviewed and stable Respiratory status: spontaneous breathing, nonlabored ventilation, respiratory function stable and patient connected to nasal cannula oxygen Cardiovascular status: blood pressure returned to baseline and stable Postop Assessment: no apparent nausea or vomiting Anesthetic complications: no    Last Vitals:  Vitals:   10/06/17 2201 10/07/17 0519  BP: (!) 98/54 (!) 98/54  Pulse: 64 64  Resp: 17 18  Temp: 36.9 C 36.9 C  SpO2: 96% 93%    Last Pain:  Vitals:   10/07/17 0519  TempSrc: Oral  PainSc:                  Effie Berkshire

## 2017-10-07 NOTE — Progress Notes (Signed)
Dr. Redmond Pulling aware via phone of pt c/o chest pain in left chest rated at 7. EKG in progress. MD asked to be called with results.

## 2017-10-07 NOTE — Progress Notes (Signed)
Dr. Redmond Pulling aware via phone of EKG results. See new orders in Epic. Pt VSS with no distress. Reassurance given.

## 2017-10-08 ENCOUNTER — Observation Stay (HOSPITAL_COMMUNITY): Payer: Medicare HMO

## 2017-10-08 ENCOUNTER — Encounter (HOSPITAL_COMMUNITY): Payer: Self-pay | Admitting: General Surgery

## 2017-10-08 DIAGNOSIS — R079 Chest pain, unspecified: Secondary | ICD-10-CM | POA: Diagnosis not present

## 2017-10-08 DIAGNOSIS — R0902 Hypoxemia: Secondary | ICD-10-CM | POA: Diagnosis not present

## 2017-10-08 DIAGNOSIS — K3532 Acute appendicitis with perforation and localized peritonitis, without abscess: Secondary | ICD-10-CM | POA: Diagnosis present

## 2017-10-08 DIAGNOSIS — N200 Calculus of kidney: Secondary | ICD-10-CM | POA: Diagnosis not present

## 2017-10-08 DIAGNOSIS — I10 Essential (primary) hypertension: Secondary | ICD-10-CM | POA: Diagnosis not present

## 2017-10-08 DIAGNOSIS — I110 Hypertensive heart disease with heart failure: Secondary | ICD-10-CM | POA: Diagnosis present

## 2017-10-08 DIAGNOSIS — Z9013 Acquired absence of bilateral breasts and nipples: Secondary | ICD-10-CM | POA: Diagnosis not present

## 2017-10-08 DIAGNOSIS — I5033 Acute on chronic diastolic (congestive) heart failure: Secondary | ICD-10-CM | POA: Diagnosis not present

## 2017-10-08 DIAGNOSIS — I480 Paroxysmal atrial fibrillation: Secondary | ICD-10-CM | POA: Diagnosis present

## 2017-10-08 DIAGNOSIS — I361 Nonrheumatic tricuspid (valve) insufficiency: Secondary | ICD-10-CM | POA: Diagnosis not present

## 2017-10-08 DIAGNOSIS — Z90710 Acquired absence of both cervix and uterus: Secondary | ICD-10-CM | POA: Diagnosis not present

## 2017-10-08 DIAGNOSIS — R1031 Right lower quadrant pain: Secondary | ICD-10-CM | POA: Diagnosis present

## 2017-10-08 DIAGNOSIS — Z853 Personal history of malignant neoplasm of breast: Secondary | ICD-10-CM | POA: Diagnosis not present

## 2017-10-08 DIAGNOSIS — K219 Gastro-esophageal reflux disease without esophagitis: Secondary | ICD-10-CM | POA: Diagnosis present

## 2017-10-08 DIAGNOSIS — R0781 Pleurodynia: Secondary | ICD-10-CM | POA: Diagnosis not present

## 2017-10-08 DIAGNOSIS — E785 Hyperlipidemia, unspecified: Secondary | ICD-10-CM | POA: Diagnosis present

## 2017-10-08 DIAGNOSIS — I2510 Atherosclerotic heart disease of native coronary artery without angina pectoris: Secondary | ICD-10-CM | POA: Diagnosis present

## 2017-10-08 DIAGNOSIS — Z96642 Presence of left artificial hip joint: Secondary | ICD-10-CM | POA: Diagnosis present

## 2017-10-08 DIAGNOSIS — R339 Retention of urine, unspecified: Secondary | ICD-10-CM | POA: Diagnosis not present

## 2017-10-08 DIAGNOSIS — M810 Age-related osteoporosis without current pathological fracture: Secondary | ICD-10-CM | POA: Diagnosis present

## 2017-10-08 DIAGNOSIS — Z87891 Personal history of nicotine dependence: Secondary | ICD-10-CM | POA: Diagnosis not present

## 2017-10-08 DIAGNOSIS — Z8673 Personal history of transient ischemic attack (TIA), and cerebral infarction without residual deficits: Secondary | ICD-10-CM | POA: Diagnosis not present

## 2017-10-08 LAB — URINALYSIS, ROUTINE W REFLEX MICROSCOPIC
Bilirubin Urine: NEGATIVE
GLUCOSE, UA: NEGATIVE mg/dL
HGB URINE DIPSTICK: NEGATIVE
Ketones, ur: NEGATIVE mg/dL
Leukocytes, UA: NEGATIVE
Nitrite: NEGATIVE
Protein, ur: NEGATIVE mg/dL
SPECIFIC GRAVITY, URINE: 1.023 (ref 1.005–1.030)
pH: 5 (ref 5.0–8.0)

## 2017-10-08 LAB — BASIC METABOLIC PANEL
ANION GAP: 8 (ref 5–15)
BUN: 9 mg/dL (ref 6–20)
CHLORIDE: 107 mmol/L (ref 101–111)
CO2: 23 mmol/L (ref 22–32)
Calcium: 8 mg/dL — ABNORMAL LOW (ref 8.9–10.3)
Creatinine, Ser: 0.88 mg/dL (ref 0.44–1.00)
GFR calc Af Amer: >60 mL/min (ref >60)
GFR calc non Af Amer: >60 mL/min (ref >60)
GLUCOSE: 147 mg/dL — AB (ref 65–99)
Potassium: 4.2 mmol/L (ref 3.5–5.1)
Sodium: 138 mmol/L (ref 135–145)

## 2017-10-08 LAB — CBC
HCT: 36.5 % (ref 36.0–46.0)
Hemoglobin: 11.9 g/dL — ABNORMAL LOW (ref 12.0–15.0)
MCH: 31.9 pg (ref 26.0–34.0)
MCHC: 32.6 g/dL (ref 30.0–36.0)
MCV: 97.9 fL (ref 78.0–100.0)
Platelets: 200 10*3/uL (ref 150–400)
RBC: 3.73 MIL/uL — AB (ref 3.87–5.11)
RDW: 12.5 % (ref 11.5–15.5)
WBC: 8.9 10*3/uL (ref 4.0–10.5)

## 2017-10-08 MED ORDER — SODIUM CHLORIDE 0.9 % IV BOLUS (SEPSIS)
500.0000 mL | Freq: Once | INTRAVENOUS | Status: AC
  Administered 2017-10-08: 500 mL via INTRAVENOUS

## 2017-10-08 NOTE — Progress Notes (Addendum)
[facility] Progress Note  3 Days Post-Op  Subjective: CC: urinary retention Patient not able to urinate on her own. Has had to be cathed several times. Mild nausea, passing flatus. Mild abdominal pain.  VSS.   Objective: Vital signs in last 24 hours: Temp:  [98.5 F (36.9 C)-99.6 F (37.6 C)] 99.6 F (37.6 C) (02/25 0555) Pulse Rate:  [44-65] 65 (02/25 0555) Resp:  [16-18] 16 (02/25 0555) BP: (98-109)/(57-59) 100/57 (02/25 0555) SpO2:  [84 %-100 %] 100 % (02/25 0555) Weight:  [58.1 kg (128 lb)] 58.1 kg (128 lb) (02/24 1800) Last BM Date: 10/07/17  Intake/Output from previous day: 02/24 0701 - 02/25 0700 In: 743.3 [I.V.:743.3] Out: 600 [Urine:600] Intake/Output this shift: Total I/O In: 120 [P.O.:120] Out: -   PE: Gen:  Alert, NAD, pleasant Card:  Regular rate and rhythm, pedal pulses 2+ BL Pulm:  Normal effort, clear to auscultation bilaterally Abd: Soft, appropriately tender, non-distended, bowel sounds present, no HSM, incisions C/D/I Skin: warm and dry, no rashes  Psych: A&Ox3   Lab Results:  Recent Labs    10/05/17 1717 10/06/17 0521  WBC 13.6* 17.5*  HGB 15.1* 13.0  HCT 45.7 39.9  PLT 317 260   BMET Recent Labs    10/05/17 1717 10/06/17 0521  NA 134* 135  K 3.9 3.9  CL 97* 103  CO2 24 21*  GLUCOSE 114* 117*  BUN 8 7  CREATININE 0.64 0.75  CALCIUM 9.0 7.8*   PT/INR Recent Labs    10/05/17 2011  LABPROT 12.6  INR 0.96   CMP     Component Value Date/Time   NA 135 10/06/2017 0521   NA 138 01/12/2016 1205   K 3.9 10/06/2017 0521   K 4.4 01/12/2016 1205   CL 103 10/06/2017 0521   CO2 21 (L) 10/06/2017 0521   CO2 27 01/12/2016 1205   GLUCOSE 117 (H) 10/06/2017 0521   GLUCOSE 137 01/12/2016 1205   BUN 7 10/06/2017 0521   BUN 7.4 01/12/2016 1205   CREATININE 0.75 10/06/2017 0521   CREATININE 0.9 01/12/2016 1205   CALCIUM 7.8 (L) 10/06/2017 0521   CALCIUM 9.4 01/12/2016 1205   PROT 7.2 10/05/2017 1717   PROT 7.4  01/12/2016 1205   ALBUMIN 3.9 10/05/2017 1717   ALBUMIN 3.6 01/12/2016 1205   AST 16 10/05/2017 1717   AST 19 01/12/2016 1205   ALT 13 (L) 10/05/2017 1717   ALT 12 01/12/2016 1205   ALKPHOS 80 10/05/2017 1717   ALKPHOS 108 01/12/2016 1205   BILITOT 0.6 10/05/2017 1717   BILITOT 0.81 01/12/2016 1205   GFRNONAA >60 10/06/2017 0521   GFRAA >60 10/06/2017 0521   Lipase     Component Value Date/Time   LIPASE 33 10/05/2017 1717       Studies/Results: No results found.  Anti-infectives: Anti-infectives (From admission, onward)   Start     Dose/Rate Route Frequency Ordered Stop   10/06/17 0600  piperacillin-tazobactam (ZOSYN) IVPB 3.375 g     3.375 g 12.5 mL/hr over 240 Minutes Intravenous Every 8 hours 10/06/17 0218 10/06/17 0903   10/05/17 2130  piperacillin-tazobactam (ZOSYN) IVPB 3.375 g     3.375 g 100 mL/hr over 30 Minutes Intravenous  Once 10/05/17 2125 10/05/17 2230       Assessment/Plan HTN - home meds HLD PAF Alcohol abuse vs dependence - CIWA Osteoporosis GERD  Acute appendicitis S/P laparoscopic appendectomy 10/05/17 Dr. Redmond Pulling - POD#3 - tolerating diet and having bowel function -  Acute  Urinary retention - possibly due to dehydration  - patient has had to be in and out cathed multiple times, bladder scan shows 60 cc this AM - place foley, check UA and urine culture - check STAT BMET, renal US - non obstructing left kidney stone, no hydro - fluid challenge with 500 cc bolus, increased IVF  FEN: regular diet, IVF VTE: SCDs, lovenox ID: IV zosyn 2/22>>  LOS: 0 days    Brigid Re , [facility] 10/08/2017, 10:05 AM Pager: [phone] Consults: [phone]  Agree with above.  Hurts, but doing okay. Not ambulating much.  Needs to ambulate more. Urine in foley looks okay - 600 cc since insertion - so she has an adequate UO.  Alphonsa Overall, MD, [facility] Surgery Pager: [phone] Office phone:   [phone]

## 2017-10-08 NOTE — Progress Notes (Signed)
Discussed low bp with Modena Jansky, PA and he gave order to hold BP meds, Atenolol and Norvasc.

## 2017-10-09 ENCOUNTER — Inpatient Hospital Stay (HOSPITAL_COMMUNITY): Payer: Medicare HMO

## 2017-10-09 DIAGNOSIS — R0781 Pleurodynia: Secondary | ICD-10-CM | POA: Diagnosis not present

## 2017-10-09 DIAGNOSIS — R0902 Hypoxemia: Secondary | ICD-10-CM | POA: Diagnosis present

## 2017-10-09 DIAGNOSIS — K3532 Acute appendicitis with perforation and localized peritonitis, without abscess: Principal | ICD-10-CM

## 2017-10-09 LAB — URINE CULTURE: CULTURE: NO GROWTH

## 2017-10-09 LAB — BASIC METABOLIC PANEL
Anion gap: 8 (ref 5–15)
BUN: 6 mg/dL (ref 6–20)
CALCIUM: 7.7 mg/dL — AB (ref 8.9–10.3)
CO2: 21 mmol/L — AB (ref 22–32)
CREATININE: 0.68 mg/dL (ref 0.44–1.00)
Chloride: 109 mmol/L (ref 101–111)
GFR calc non Af Amer: >60 mL/min (ref >60)
Glucose, Bld: 114 mg/dL — ABNORMAL HIGH (ref 65–99)
Potassium: 3.9 mmol/L (ref 3.5–5.1)
SODIUM: 138 mmol/L (ref 135–145)

## 2017-10-09 LAB — BRAIN NATRIURETIC PEPTIDE: B NATRIURETIC PEPTIDE 5: 299.7 pg/mL — AB (ref 0.0–100.0)

## 2017-10-09 LAB — TROPONIN I: Troponin I: <0.03 ng/mL (ref <0.03)

## 2017-10-09 MED ORDER — IOPAMIDOL (ISOVUE-370) INJECTION 76%
INTRAVENOUS | Status: AC
  Filled 2017-10-09: qty 100

## 2017-10-09 MED ORDER — OXYCODONE HCL 5 MG PO TABS
2.5000 mg | ORAL_TABLET | ORAL | Status: DC | PRN
  Administered 2017-10-09 – 2017-10-10 (×6): 5 mg via ORAL
  Filled 2017-10-09 (×7): qty 1

## 2017-10-09 MED ORDER — CODEINE SULFATE 30 MG PO TABS: 30.0000 mg | ORAL_TABLET | Freq: Four times a day (QID) | ORAL | Status: DC | PRN

## 2017-10-09 MED ORDER — IOPAMIDOL (ISOVUE-370) INJECTION 76%
100.0000 mL | Freq: Once | INTRAVENOUS | Status: AC | PRN
  Administered 2017-10-09: 100 mL via INTRAVENOUS

## 2017-10-09 MED ORDER — SODIUM CHLORIDE 0.9 % IJ SOLN
INTRAMUSCULAR | Status: AC
  Filled 2017-10-09: qty 50

## 2017-10-09 MED ORDER — TAMSULOSIN HCL 0.4 MG PO CAPS
0.4000 mg | ORAL_CAPSULE | Freq: Every day | ORAL | Status: DC
  Administered 2017-10-09 – 2017-10-11 (×3): 0.4 mg via ORAL
  Filled 2017-10-09 (×3): qty 1

## 2017-10-09 NOTE — Consult Note (Signed)
History and Physical    [name] DOB: Jan 13, 1941 DOA: 10/05/2017  PCP: Josetta Huddle, MD   reason for consult: Hypoxia Consulting team: General surgery  HPI: [name] is a 77 y.o. female with medical history significant of hypertension, proximal A. fib, hyperlipidemia is postop day 4 from appendectomy.  Patient has been having routine postoperative course except for some urinary retention.  Patient reports that she has been having pleuritic chest pain since yesterday that only hurts when she takes a deep breath.  Also since yesterday she is been having a couple of episodes of hypoxia down to 84% on room air.  She denies any chest pain at rest when she is not deep breathing.  She denies any shortness of breath.  She denies any cough.  She denies any lower extremity pain or swelling.  She has been getting up and moving around.  We are being consulted to evaluate her hypoxia.  She is also had a recent chest x-ray which showed some small pleural effusions but no infiltrate.  Patient otherwise is also complaining of nausea.  She states her abdomen is a little sore but is better than what it was 4 days ago.  Review of Systems: As per HPI otherwise 10 point review of systems negative.   Past Medical History:  Diagnosis Date  . Alcohol abuse   . Anemia   . Arthritis   . Bacterial vaginosis   . Cancer ([place]) 01/2015   Breast cancer right   . Cerebrovascular accident (stroke) ([place]) 2/07   left frontal lobe  . Closed left hip fracture ([place]) 5/14  . Complication of anesthesia    woke up too early  with kidney stone surgery and wrist surgery  . Depression   . Dupuytren's contracture   . Fibrocystic breast disease   . Fracture of metatarsal bone of right foot    5th metatarsal  . GERD (gastroesophageal reflux disease)   . Gout   . Headache   . Hyperlipidemia LDL goal <70   . Hypertension   . Paroxysmal A-fib ([place])   . Renal calculus   . Vitamin B12 deficiency   .  Vitamin D deficiency     Past Surgical History:  Procedure Laterality Date  . ABDOMINAL HYSTERECTOMY    . cataracts Bilateral   . ESOPHAGEAL MANOMETRY N/A 08/15/2017   Procedure: ESOPHAGEAL MANOMETRY (EM);  Surgeon: Ronnette Juniper, MD;  Location: [facility];  Service: Gastroenterology;  Laterality: N/A;  . ESOPHAGOGASTRODUODENOSCOPY (EGD) WITH PROPOFOL N/A 05/02/2016   Procedure: ESOPHAGOGASTRODUODENOSCOPY (EGD) WITH PROPOFOL;  Surgeon: Garlan Fair, MD;  Location: [facility];  Service: Endoscopy;  Laterality: N/A;  . HIP ARTHROPLASTY Left 12/17/2012   Procedure: LEFT HIP HEMI ARTHROPLASTY ;  Surgeon: Rozanna Box, MD;  Location: [facility];  Service: Orthopedics;  Laterality: Left;  . LAPAROSCOPIC APPENDECTOMY N/A 10/05/2017   Procedure: APPENDECTOMY LAPAROSCOPIC;  Surgeon: Greer Pickerel, MD;  Location: [facility];  Service: General;  Laterality: N/A;  . left wrist surgery surgery  7 yrs ago  . LEG SURGERY Left   . LITHOTRIPSY    . MASTECTOMY W/ SENTINEL NODE BIOPSY Bilateral 01/20/2016   Procedure: BILATERAL TOTAL MASTECTOMY WITH RIGHT SENTINEL LYMPH NODE BIOPSY;  Surgeon: Rolm Bookbinder, MD;  Location: [facility];  Service: General;  Laterality: Bilateral;  . WRIST SURGERY     left wrist     reports that  has never smoked. Her smokeless tobacco use includes chew. She reports that she  drinks alcohol. She reports that she does not use drugs.  Allergies  Allergen Reactions  . Ambien [Zolpidem Tartrate] Other (See Comments)    Causes nightmares    Family History  Problem Relation Age of Onset  . Stroke Mother     Prior to Admission medications   Medication Sig Start Date End Date Taking? Authorizing Provider  amLODipine (NORVASC) 5 MG tablet Take 5 mg by mouth daily.   Yes [provider]  atenolol (TENORMIN) 25 MG tablet Take 1 tablet (25 mg total) by mouth daily. 06/06/14  Yes Hongalgi, Lenis Dickinson, MD  Cyanocobalamin (VITAMIN B-12 PO) Take 1 tablet by mouth at bedtime.   Yes  [provider]  cephALEXin (KEFLEX) 500 MG capsule Take 1 capsule (500 mg total) by mouth 2 (two) times daily. Patient not taking: Reported on 10/05/2017 04/12/17   Robbie Lis, MD  cholecalciferol (VITAMIN D) 1000 UNITS tablet Take 1,000 Units by mouth once a week.     [provider]  ibuprofen (ADVIL) 200 MG tablet Take 400 mg by mouth every 6 (six) hours as needed for fever, headache or mild pain. Reported on 01/12/2016    [provider]    Physical Exam: Vitals:   10/08/17 1409 10/08/17 1941 10/08/17 2030 10/09/17 0544  BP: 126/70 119/60 123/60 114/60  Pulse: 67 68 (!) 59 63  Resp: 18  18 18   Temp: 98.8 F (37.1 C)  99.1 F (37.3 C) 98.4 F (36.9 C)  TempSrc: Oral   Oral  SpO2: 91%  (!) 84% (!) 88%  Weight:      Height:        Constitutional: NAD, calm, comfortable Vitals:   10/08/17 1409 10/08/17 1941 10/08/17 2030 10/09/17 0544  BP: 126/70 119/60 123/60 114/60  Pulse: 67 68 (!) 59 63  Resp: 18  18 18   Temp: 98.8 F (37.1 C)  99.1 F (37.3 C) 98.4 F (36.9 C)  TempSrc: Oral   Oral  SpO2: 91%  (!) 84% (!) 88%  Weight:      Height:       Eyes: PERRL, lids and conjunctivae normal ENMT: Mucous membranes are moist. Posterior pharynx clear of any exudate or lesions.Normal dentition.  Neck: normal, supple, no masses, no thyromegaly Respiratory: clear to auscultation bilaterally, no wheezing, no crackles. Normal respiratory effort. No accessory muscle use.  Cardiovascular: Regular rate and rhythm, no murmurs / rubs / gallops. No extremity edema. 2+ pedal pulses. No carotid bruits.  Abdomen: no tenderness, no masses palpated. No hepatosplenomegaly. Bowel sounds positive.  Musculoskeletal: no clubbing / cyanosis. No joint deformity upper and lower extremities. Good ROM, no contractures. Normal muscle tone.  Skin: no rashes, lesions, ulcers. No induration Neurologic: CN 2-12 grossly intact. Sensation intact, DTR normal. Strength 5/5 in all 4.    Psychiatric: Normal judgment and insight. Alert and oriented x 3. Normal mood.    Labs on Admission: I have personally reviewed following labs and imaging studies  CBC: Recent Labs  Lab 10/05/17 1717 10/06/17 0521 10/08/17 1021  WBC 13.6* 17.5* 8.9  HGB 15.1* 13.0 11.9*  HCT 45.7 39.9 36.5  MCV 95.8 97.1 97.9  PLT 317 260 341   Basic Metabolic Panel: Recent Labs  Lab 10/05/17 1717 10/06/17 0521 10/08/17 1021 10/09/17 0426  NA 134* 135 138 138  K 3.9 3.9 4.2 3.9  CL 97* 103 107 109  CO2 24 21* 23 21*  GLUCOSE 114* 117* 147* 114*  BUN 8 7 9  6  CREATININE 0.64 0.75 0.88 0.68  CALCIUM 9.0 7.8* 8.0* 7.7*   GFR: Estimated Creatinine Clearance: 54 mL/min (by C-G formula based on SCr of 0.68 mg/dL). Liver Function Tests: Recent Labs  Lab 10/05/17 1717  AST 16  ALT 13*  ALKPHOS 80  BILITOT 0.6  PROT 7.2  ALBUMIN 3.9   Recent Labs  Lab 10/05/17 1717  LIPASE 33   No results for input(s): AMMONIA in the last 168 hours. Coagulation Profile: Recent Labs  Lab 10/05/17 2011  INR 0.96   Cardiac Enzymes: Recent Labs  Lab 10/07/17 1454  TROPONINI <0.03   BNP (last 3 results) No results for input(s): PROBNP in the last 8760 hours. HbA1C: No results for input(s): HGBA1C in the last 72 hours. CBG: No results for input(s): GLUCAP in the last 168 hours. Lipid Profile: No results for input(s): CHOL, HDL, LDLCALC, TRIG, CHOLHDL, LDLDIRECT in the last 72 hours. Thyroid Function Tests: No results for input(s): TSH, T4TOTAL, FREET4, T3FREE, THYROIDAB in the last 72 hours. Anemia Panel: No results for input(s): VITAMINB12, FOLATE, FERRITIN, TIBC, IRON, RETICCTPCT in the last 72 hours. Urine analysis:    Component Value Date/Time   COLORURINE AMBER (A) 10/08/2017 1015   APPEARANCEUR CLEAR 10/08/2017 1015   LABSPEC 1.023 10/08/2017 1015   PHURINE 5.0 10/08/2017 1015   GLUCOSEU NEGATIVE 10/08/2017 1015   HGBUR NEGATIVE 10/08/2017 1015   BILIRUBINUR NEGATIVE  10/08/2017 1015   KETONESUR NEGATIVE 10/08/2017 1015   PROTEINUR NEGATIVE 10/08/2017 1015   UROBILINOGEN 0.2 09/11/2014 2309   NITRITE NEGATIVE 10/08/2017 1015   LEUKOCYTESUR NEGATIVE 10/08/2017 1015   Sepsis Labs: !!!!!!!!!!!!!!!!!!!!!!!!!!!!!!!!!!!!!!!!!!!! @LABRCNTIP (procalcitonin:4,lacticidven:4) ) Recent Results (from the past 240 hour(s))  Blood culture (routine x 2)     Status: None (Preliminary result)   Collection Time: 10/05/17  9:13 PM  Result Value Ref Range Status   Specimen Description   Final    BLOOD RIGHT ANTECUBITAL Performed at [facility], [facility] [address], [place], [place] [address]    Special Requests   Final    BOTTLES DRAWN AEROBIC AND ANAEROBIC Blood Culture adequate volume Performed at [facility] [address] [place], [place] [address]    Culture   Final    NO GROWTH 2 DAYS Performed at [facility] [address], [place], [place] [address]    Report Status PENDING  Incomplete  Blood culture (routine x 2)     Status: None (Preliminary result)   Collection Time: 10/05/17  9:39 PM  Result Value Ref Range Status   Specimen Description   Final    BLOOD RIGHT HAND Performed at [facility] [address] [place], [place] [address]    Special Requests   Final    IN PEDIATRIC BOTTLE Blood Culture adequate volume Performed at [facility] [address] [place], [place] [address]    Culture   Final    NO GROWTH 2 DAYS Performed at [facility] [address] [place], [place] [address]    Report Status PENDING  Incomplete     Radiological Exams on Admission: US Renal  Result Date: 10/08/2017 CLINICAL DATA:  Urinary retention and hypertension EXAM: RENAL / URINARY TRACT ULTRASOUND COMPLETE COMPARISON:  CT abdomen and pelvis October 05, 2017 FINDINGS: Right Kidney: Length: 9.5 cm. Echogenicity and renal cortical thickness are within normal limits. No  perinephric fluid or hydronephrosis visualized. There is a cyst arising from the mid to lower  pole right kidney measuring 0.7 x 0.7 x 0.9 cm. No sonographically demonstrable calculus or ureterectasis. Left Kidney: Length: 9.6 cm. Echogenicity okay mm antrum with is within normal limits. No perinephric fluid or hydronephrosis visualized. There is an 8 x 8 x 8 mm cyst in the medial mid left kidney. There is a calculus measuring 8 mm in the upper pole region. There is a 1.3 cm calculus in the mid left kidney. No ureterectasis. Bladder: Appears normal for degree of bladder distention. IMPRESSION: Nonobstructing calculi left kidney. Small cyst in each kidney. No obstructing focus in either kidney. Renal cortical thickness and echogenicity within normal limits bilaterally. Electronically Signed   By: Lowella Grip III M.D.   On: 10/08/2017 11:53   Dg Chest Port 1 View  Result Date: 10/09/2017 CLINICAL DATA:  Cough, hypoxia, chest discomfort. History of atrial fibrillation, previous CVA. EXAM: PORTABLE CHEST 1 VIEW COMPARISON:  Chest x-ray of April 11, 2017 FINDINGS: The lungs are adequately inflated. The interstitial markings are coarse. There is a small left pleural effusion and probable tiny right pleural effusion. The heart is mildly enlarged. The central pulmonary vascularity is prominent. The trachea is midline. There is calcification in the wall of the aortic arch. IMPRESSION: Chronic bronchitic changes, stable. Mild central pulmonary vascular congestion. Small left-sided pleural effusion. Thoracic aortic atherosclerosis. Electronically Signed   By: Jerrard Bradburn  [place] M.D.   On: 10/09/2017 09:38    EKG: Pending Old chart reviewed Case discussed with general surgery PA Kelly Chest x-ray reviewed with small left-sided pleural effusion with no edema or infiltrate  Assessment/Plan 77 year old female postop day 4 from a status post appendectomy noted to have pleuritic chest pain and brief episodes of  hypoxia Principal Problem:   Pleuritic chest pain-will obtain a stat troponin.  Also obtain a stat 12-lead EKG.  Agree with getting a CTA of her chest to rule out PE.  Patient is afebrile with normal white count doubt pneumonia.  She has been on Zosyn however.  Follow-up with CTA results.  Follow-up in reviewing EKG and troponin.   Active Problems:   Hypoxia-as above obtain CTA   PAF (paroxysmal atrial fibrillation) (HCC)-currently rate controlled   H/O: CVA (cerebrovascular accident)-stable  accelerated hypertension stable-   Acute appendicitis-per general surgery team   We will follow along with you daily.  Please call with any questions.  Thank you.   DVT prophylaxis: Lovenox Code Status: Full Family Communication: None Disposition Plan: Per surgical team  Isauro Skelley A MD [facility]  If 7PM-7AM, please contact night-coverage [email or web address] Password [facility]  10/09/2017, 11:31 AM

## 2017-10-09 NOTE — Progress Notes (Addendum)
[facility] Progress Note  4 Days Post-Op  Subjective: CC: abdominal pain Patient complaining of suprapubic abdominal pain. More nausea this AM, no vomiting.   Had a BM this AM and last night. UOP improving.   Some hypoxia noted, no swelling in LEs and no LE cramping.   Objective: Vital signs in last 24 hours: Temp:  [98.4 F (36.9 C)-99.1 F (37.3 C)] 98.4 F (36.9 C) (02/26 0544) Pulse Rate:  [59-68] 63 (02/26 0544) Resp:  [18] 18 (02/26 0544) BP: (104-126)/(60-71) 114/60 (02/26 0544) SpO2:  [84 %-91 %] 88 % (02/26 0544) Last BM Date: 10/07/17  Intake/Output from previous day: 02/25 0701 - 02/26 0700 In: 2890 [P.O.:360; I.V.:1530; IV Piggyback:1000] Out: 500 [Urine:500] Intake/Output this shift: Total I/O In: 360 [P.O.:360] Out: -   PE: Gen:  Alert, NAD, pleasant Card:  Regular rate and rhythm, pedal pulses 2+ BL, no LE edema, negative Homan's bilaterally  Pulm:  Normal effort, clear to auscultation bilaterally, pulled 500 on IS Abd: Soft, appropriately tender suprapubic, non-distended, bowel sounds present, no HSM, incisions C/D/I Skin: warm and dry, no rashes    Lab Results:  Recent Labs    10/08/17 1021  WBC 8.9  HGB 11.9*  HCT 36.5  PLT 200   BMET Recent Labs    10/08/17 1021 10/09/17 0426  NA 138 138  K 4.2 3.9  CL 107 109  CO2 23 21*  GLUCOSE 147* 114*  BUN 9 6  CREATININE 0.88 0.68  CALCIUM 8.0* 7.7*   PT/INR No results for input(s): LABPROT, INR in the last 72 hours. CMP     Component Value Date/Time   NA 138 10/09/2017 0426   NA 138 01/12/2016 1205   K 3.9 10/09/2017 0426   K 4.4 01/12/2016 1205   CL 109 10/09/2017 0426   CO2 21 (L) 10/09/2017 0426   CO2 27 01/12/2016 1205   GLUCOSE 114 (H) 10/09/2017 0426   GLUCOSE 137 01/12/2016 1205   BUN 6 10/09/2017 0426   BUN 7.4 01/12/2016 1205   CREATININE 0.68 10/09/2017 0426   CREATININE 0.9 01/12/2016 1205   CALCIUM 7.7 (L) 10/09/2017 0426   CALCIUM 9.4 01/12/2016 1205    PROT 7.2 10/05/2017 1717   PROT 7.4 01/12/2016 1205   ALBUMIN 3.9 10/05/2017 1717   ALBUMIN 3.6 01/12/2016 1205   AST 16 10/05/2017 1717   AST 19 01/12/2016 1205   ALT 13 (L) 10/05/2017 1717   ALT 12 01/12/2016 1205   ALKPHOS 80 10/05/2017 1717   ALKPHOS 108 01/12/2016 1205   BILITOT 0.6 10/05/2017 1717   BILITOT 0.81 01/12/2016 1205   GFRNONAA >60 10/09/2017 0426   GFRAA >60 10/09/2017 0426   Lipase     Component Value Date/Time   LIPASE 33 10/05/2017 1717       Studies/Results: US Renal  Result Date: 10/08/2017 CLINICAL DATA:  Urinary retention and hypertension EXAM: RENAL / URINARY TRACT ULTRASOUND COMPLETE COMPARISON:  CT abdomen and pelvis October 05, 2017 FINDINGS: Right Kidney: Length: 9.5 cm. Echogenicity and renal cortical thickness are within normal limits. No perinephric fluid or hydronephrosis visualized. There is a cyst arising from the mid to lower pole right kidney measuring 0.7 x 0.7 x 0.9 cm. No sonographically demonstrable calculus or ureterectasis. Left Kidney: Length: 9.6 cm. Echogenicity okay mm antrum with is within normal limits. No perinephric fluid or hydronephrosis visualized. There is an 8 x 8 x 8 mm cyst in the medial mid left kidney. There is a calculus  measuring 8 mm in the upper pole region. There is a 1.3 cm calculus in the mid left kidney. No ureterectasis. Bladder: Appears normal for degree of bladder distention. IMPRESSION: Nonobstructing calculi left kidney. Small cyst in each kidney. No obstructing focus in either kidney. Renal cortical thickness and echogenicity within normal limits bilaterally. Electronically Signed   By: Lowella Grip III M.D.   On: 10/08/2017 11:53   Dg Chest Port 1 View  Result Date: 10/09/2017 CLINICAL DATA:  Cough, hypoxia, chest discomfort. History of atrial fibrillation, previous CVA. EXAM: PORTABLE CHEST 1 VIEW COMPARISON:  Chest x-ray of April 11, 2017 FINDINGS: The lungs are adequately inflated. The interstitial  markings are coarse. There is a small left pleural effusion and probable tiny right pleural effusion. The heart is mildly enlarged. The central pulmonary vascularity is prominent. The trachea is midline. There is calcification in the wall of the aortic arch. IMPRESSION: Chronic bronchitic changes, stable. Mild central pulmonary vascular congestion. Small left-sided pleural effusion. Thoracic aortic atherosclerosis. Electronically Signed   By: Lashonta Pilling  [place] M.D.   On: 10/09/2017 09:38    Anti-infectives: Anti-infectives (From admission, onward)   Start     Dose/Rate Route Frequency Ordered Stop   10/06/17 0600  piperacillin-tazobactam (ZOSYN) IVPB 3.375 g     3.375 g 12.5 mL/hr over 240 Minutes Intravenous Every 8 hours 10/06/17 0218 10/06/17 0903   10/05/17 2130  piperacillin-tazobactam (ZOSYN) IVPB 3.375 g     3.375 g 100 mL/hr over 30 Minutes Intravenous  Once 10/05/17 2125 10/05/17 2230       Assessment/Plan HTN - holding home meds for now, BP fine HLD PAF Alcohol abuse vs dependence - CIWA Osteoporosis GERD  Acute appendicitis S/P laparoscopic appendectomy 10/05/17 Dr. Redmond Pulling - POD#4 - tolerating diet and having bowel function - needs to mobilize more   Acute Urinary retention - possibly due to dehydration, continue IVF and encourage PO intake of fluids - UA negative, cr trending down, renal US normal - started flomax, will trial d/c foley tomorrow Hypoxia - CXR negative except for very small pleural effusions - patient has been on lovenox but will check CTA to r/o PE - medicine consulted  FEN: regular diet, IVF VTE: SCDs, lovenox ID: [id number] [id number] 2/22>>   LOS: 1 day   Brigid Re , [facility] 10/09/2017, 10:51 AM Pager: [phone] Consults: [phone]  Agree with above. Needs to ambulate more  Alphonsa Overall, MD, [facility] Pager: [phone] Office phone:  [phone]

## 2017-10-10 ENCOUNTER — Inpatient Hospital Stay (HOSPITAL_COMMUNITY): Payer: Medicare HMO

## 2017-10-10 DIAGNOSIS — R0781 Pleurodynia: Secondary | ICD-10-CM

## 2017-10-10 DIAGNOSIS — I361 Nonrheumatic tricuspid (valve) insufficiency: Secondary | ICD-10-CM

## 2017-10-10 DIAGNOSIS — I10 Essential (primary) hypertension: Secondary | ICD-10-CM

## 2017-10-10 DIAGNOSIS — R0902 Hypoxemia: Secondary | ICD-10-CM

## 2017-10-10 DIAGNOSIS — I480 Paroxysmal atrial fibrillation: Secondary | ICD-10-CM

## 2017-10-10 LAB — ECHOCARDIOGRAM COMPLETE
AOASC: 38 cm
AVLVOTPG: 2 mmHg
CHL CUP MV DEC (S): 254
E decel time: 254 msec
FS: 15 % — AB (ref 28–44)
Height: 70 in
IV/PV OW: 1.3
LA diam index: 2.14 cm/m2
LASIZE: 37 mm
LAVOL: 47.2 mL
LAVOLA4C: 46.4 mL
LAVOLIN: 27.3 mL/m2
LEFT ATRIUM END SYS DIAM: 37 mm
LV PW d: 10 mm — AB (ref 0.6–1.1)
LVELAT: 7.29 cm/s
LVOT SV: 46 mL
LVOT VTI: 14.6 cm
LVOT area: 3.14 cm2
LVOT diameter: 20 mm
LVOT peak vel: 76.1 cm/s
Lateral S' vel: 10.3 cm/s
MV pk E vel: 2.6 m/s
PV Reg grad dias: 8 mmHg
PV Reg vel dias: 145 cm/s
RV sys press: 44 mmHg
Reg peak vel: 267 cm/s
TAPSE: 19.1 mm
TDI e' lateral: 7.29
TDI e' medial: 5.44
TRMAXVEL: 267 cm/s
Weight: 2048 oz

## 2017-10-10 LAB — TROPONIN I: Troponin I: <0.03 ng/mL (ref <0.03)

## 2017-10-10 MED ORDER — ACETAMINOPHEN 500 MG PO TABS
500.0000 mg | ORAL_TABLET | Freq: Four times a day (QID) | ORAL | Status: DC | PRN
  Administered 2017-10-10 (×2): 500 mg via ORAL
  Filled 2017-10-10 (×2): qty 1

## 2017-10-10 MED ORDER — FUROSEMIDE 10 MG/ML IJ SOLN
20.0000 mg | Freq: Once | INTRAMUSCULAR | Status: AC
  Administered 2017-10-10: 20 mg via INTRAVENOUS
  Filled 2017-10-10: qty 2

## 2017-10-10 MED ORDER — ASPIRIN 81 MG PO CHEW
81.0000 mg | CHEWABLE_TABLET | Freq: Every day | ORAL | Status: DC
  Administered 2017-10-10 – 2017-10-11 (×2): 81 mg via ORAL
  Filled 2017-10-10 (×2): qty 1

## 2017-10-10 NOTE — Progress Notes (Addendum)
[facility] Progress Note  5 Days Post-Op  Subjective: CC: no new complaints Patient still with some mild abdominal pain. Reports she walked in the halls 4x yesterday. Using IS and pulling up to 1000.  UOP good. VSS.   Objective: Vital signs in last 24 hours: Temp:  [98.2 F (36.8 C)-98.7 F (37.1 C)] 98.6 F (37 C) (02/27 0620) Pulse Rate:  [68-87] 87 (02/27 0620) Resp:  [18] 18 (02/27 0620) BP: (117-123)/(54-64) 118/58 (02/27 0620) SpO2:  [90 %-96 %] 96 % (02/27 0620) Last BM Date: 10/09/17  Intake/Output from previous day: 02/26 0701 - 02/27 0700 In: 3254.2 [P.O.:2120; I.V.:1134.2] Out: 3000 [Urine:3000] Intake/Output this shift: No intake/output data recorded.  PE: Gen: Alert, NAD, pleasant Card: Regular rate and rhythm, pedal pulses 2+ BL, no LE edema Pulm: Normal effort, clear to auscultation bilaterally, pulled 1000 on IS Abd: Soft, diffuse mild tenderness, non-distended, bowel sounds present, no HSM, incisions C/D/I Skin: warm and dry, no rashes    Lab Results:  Recent Labs    10/08/17 1021  WBC 8.9  HGB 11.9*  HCT 36.5  PLT 200   BMET Recent Labs    10/08/17 1021 10/09/17 0426  NA 138 138  K 4.2 3.9  CL 107 109  CO2 23 21*  GLUCOSE 147* 114*  BUN 9 6  CREATININE 0.88 0.68  CALCIUM 8.0* 7.7*   PT/INR No results for input(s): LABPROT, INR in the last 72 hours. CMP     Component Value Date/Time   NA 138 10/09/2017 0426   NA 138 01/12/2016 1205   K 3.9 10/09/2017 0426   K 4.4 01/12/2016 1205   CL 109 10/09/2017 0426   CO2 21 (L) 10/09/2017 0426   CO2 27 01/12/2016 1205   GLUCOSE 114 (H) 10/09/2017 0426   GLUCOSE 137 01/12/2016 1205   BUN 6 10/09/2017 0426   BUN 7.4 01/12/2016 1205   CREATININE 0.68 10/09/2017 0426   CREATININE 0.9 01/12/2016 1205   CALCIUM 7.7 (L) 10/09/2017 0426   CALCIUM 9.4 01/12/2016 1205   PROT 7.2 10/05/2017 1717   PROT 7.4 01/12/2016 1205   ALBUMIN 3.9 10/05/2017 1717   ALBUMIN 3.6  01/12/2016 1205   AST 16 10/05/2017 1717   AST 19 01/12/2016 1205   ALT 13 (L) 10/05/2017 1717   ALT 12 01/12/2016 1205   ALKPHOS 80 10/05/2017 1717   ALKPHOS 108 01/12/2016 1205   BILITOT 0.6 10/05/2017 1717   BILITOT 0.81 01/12/2016 1205   GFRNONAA >60 10/09/2017 0426   GFRAA >60 10/09/2017 0426   Lipase     Component Value Date/Time   LIPASE 33 10/05/2017 1717       Studies/Results: Ct Angio Chest Pe W Or Wo Contrast  Result Date: 10/09/2017 CLINICAL DATA:  Cough; lu cp since yesterdaySOB on exertionHx bil breast ca--surg only EXAM: CT ANGIOGRAPHY CHEST WITH CONTRAST TECHNIQUE: Multidetector CT imaging of the chest was performed using the standard protocol during bolus administration of intravenous contrast. Multiplanar CT image reconstructions and MIPs were obtained to evaluate the vascular anatomy. CONTRAST:  125mL ISOVUE-370 IOPAMIDOL (ISOVUE-370) INJECTION 76% COMPARISON:  Current chest radiograph. Abdomen and pelvis CT, 10/05/2017. FINDINGS: Cardiovascular: Satisfactory opacification of the pulmonary arteries to the segmental level. No evidence of pulmonary embolism. Heart top-normal in size. No pericardial effusion. Mild three-vessel coronary artery calcifications. Great vessels are normal in caliber. Atherosclerotic calcifications are noted along the aortic arch and descending thoracic aorta. Mediastinum/Nodes: No neck base or axillary masses or pathologically enlarged  lymph nodes. There are scattered subcentimeter shotty mediastinal lymph nodes. A subcarinal lymph node is mildly enlarged measuring 1.6 cm in short axis. No other enlarged lymph nodes. No discrete masses. No hilar masses or pathologically enlarged lymph nodes. Trachea is patent. Esophagus is unremarkable. Lungs/Pleura: Small to moderate right and small left pleural effusions. Mild interstitial thickening noted in the upper lungs. There is dependent opacity most evident in the lower lobes, a combination of ground-glass  type opacity and more confluent opacity adjacent to the pleural effusions. This may all reflect atelectasis. A component of infection or asymmetric edema is possible. Small nodule in the right lower lobe adjacent to the oblique fissure, image 61, series 10, measuring 4 mm. No other discrete nodules. No pneumothorax. Upper Abdomen: Low-density liver lesions as noted on the recent prior CT, likely cysts. No acute findings. Musculoskeletal: No fracture or acute finding. No osteoblastic or osteolytic lesions. Review of the MIP images confirms the above findings. IMPRESSION: 1. Small to moderate right and small left pleural effusions with associated dependent atelectasis. Additional ground-glass type opacity in the posterior lungs is most likely additional atelectasis or mild asymmetric pulmonary edema. Infection is possible. Suspect a mild degree of congestive heart failure/fluid overload in combination with atelectasis and pleural fluid. 2. 4 mm nodule in the right lower lobe. No follow-up needed if patient is low-risk. Non-contrast chest CT can be considered in 12 months if patient is high-risk. This recommendation follows the consensus statement: Guidelines for Management of Incidental Pulmonary Nodules Detected on CT Images: From the Fleischner Society 2017; Radiology 2017; 284:228-243. Aortic Atherosclerosis (ICD10-I70.0). Electronically Signed   By: Lajean Manes M.D.   On: 10/09/2017 12:55   US Renal  Result Date: 10/08/2017 CLINICAL DATA:  Urinary retention and hypertension EXAM: RENAL / URINARY TRACT ULTRASOUND COMPLETE COMPARISON:  CT abdomen and pelvis October 05, 2017 FINDINGS: Right Kidney: Length: 9.5 cm. Echogenicity and renal cortical thickness are within normal limits. No perinephric fluid or hydronephrosis visualized. There is a cyst arising from the mid to lower pole right kidney measuring 0.7 x 0.7 x 0.9 cm. No sonographically demonstrable calculus or ureterectasis. Left Kidney: Length: 9.6 cm.  Echogenicity okay mm antrum with is within normal limits. No perinephric fluid or hydronephrosis visualized. There is an 8 x 8 x 8 mm cyst in the medial mid left kidney. There is a calculus measuring 8 mm in the upper pole region. There is a 1.3 cm calculus in the mid left kidney. No ureterectasis. Bladder: Appears normal for degree of bladder distention. IMPRESSION: Nonobstructing calculi left kidney. Small cyst in each kidney. No obstructing focus in either kidney. Renal cortical thickness and echogenicity within normal limits bilaterally. Electronically Signed   By: Lowella Grip III M.D.   On: 10/08/2017 11:53   Dg Chest Port 1 View  Result Date: 10/09/2017 CLINICAL DATA:  Cough, hypoxia, chest discomfort. History of atrial fibrillation, previous CVA. EXAM: PORTABLE CHEST 1 VIEW COMPARISON:  Chest x-ray of April 11, 2017 FINDINGS: The lungs are adequately inflated. The interstitial markings are coarse. There is a small left pleural effusion and probable tiny right pleural effusion. The heart is mildly enlarged. The central pulmonary vascularity is prominent. The trachea is midline. There is calcification in the wall of the aortic arch. IMPRESSION: Chronic bronchitic changes, stable. Mild central pulmonary vascular congestion. Small left-sided pleural effusion. Thoracic aortic atherosclerosis. Electronically Signed   By: Maci Eickholt  [place] M.D.   On: 10/09/2017 09:38    Anti-infectives: Anti-infectives (From admission,  onward)   Start     Dose/Rate Route Frequency Ordered Stop   10/06/17 0600  piperacillin-tazobactam (ZOSYN) IVPB 3.375 g     3.375 g 12.5 mL/hr over 240 Minutes Intravenous Every 8 hours 10/06/17 0218 10/06/17 0903   10/05/17 2130  piperacillin-tazobactam (ZOSYN) IVPB 3.375 g     3.375 g 100 mL/hr over 30 Minutes Intravenous  Once 10/05/17 2125 10/05/17 2230       Assessment/Plan HTN - holding home meds for now, BP fine HLD PAF Alcohol abuse vs dependence -  CIWA Osteoporosis GERD  Acute appendicitis S/P laparoscopic appendectomy 10/05/17 Dr. Redmond Pulling - POD#5 - tolerating diet and having bowel function - needs to mobilize more   AcuteUrinary retention- possibly due to dehydration, continue IVF and encourage PO intake of fluids - UA negative, cr trending down, renal USnormal -  voiding trial today, d/c foley  Hypoxia - CXR negative except for very small pleural effusions - CTA negative, troponin negative  - BNP very mildy elevated - EKG shows RBBB - will consult cards - medicine involved, appreciate assistance  FEN: regular diet, IVF VTE: SCDs, lovenox ID: [id number] zosyn 2/22>>  Dispo: Cards consult pending. Voiding trial today, possibly home in the next 24-48 hrs  LOS: 2 days    Brigid Re , [facility] 10/10/2017, 9:02 AM Pager: [phone] Consults: [phone]  Agree with above. She is not motivated and trying to get her to walk is difficult. She has done okay from the appendix standpoint.  Seen by Dr. Tamala Julian of cardiology today.  Cardiac markers pending (first troponin normal).  Consider myocardial perfusion   Alphonsa Overall, MD, [facility] Pager: [phone] Office phone:  [phone]

## 2017-10-10 NOTE — Progress Notes (Addendum)
PROGRESS NOTE    [name]  HYW:[id number] DOB: 1941-03-02 DOA: 10/05/2017 PCP: Josetta Huddle, MD    Brief Narrative: [name] is a 77 y.o. female with medical history significant of hypertension, proximal A. fib, hyperlipidemia is status post appendectomy presents with difficulty breathing and sharp chest pain and she was found to be hypoxic.  Hospitalist requested for medical consult for further evaluation.  Chest x-ray showed pleural effusions but no infiltrates.  CT angiogram of the chest does not show any pulmonary infarcts shows bilateral pleural effusions and coronary calcification.  Today she denies any complaints of chest pain or shortness of breath.  Cardiology consulted by surgery for right bundle branch block.    Assessment & Plan:   Principal Problem:   Pleuritic chest pain Active Problems:   PAF (paroxysmal atrial fibrillation) (HCC)   H/O: CVA (cerebrovascular accident)   Accelerated hypertension   Acute appendicitis   Hypoxia   Appendicitis with perforation   Shortness of breath pleuritic chest pain Have resolved unclear if patient's chest pain is probably from pleural effusions.  CT angiogram rules out pneumonia and PE.  Patient probably has a component of mild acute on chronic diastolic heart failure.  Cardiology consulted by surgery for further evaluation.  An echocardiogram has been ordered.   Paroxysmal atrial fibrillation In sinus.  Rate controlled.  Hypertension well controlled.  Appendicitis with perforation status post appendectomy further evaluation as per surgery.   DVT prophylaxis: (Lovenox Code Status: Full code  Family Communication: None at bedside  Disposition Plan: Per primary service  Procedures echocardiogram ordered  Antimicrobials: None   Subjective: Reports mild headache but no other complaints chest pain and shortness of breath have resolved  Objective: Vitals:   10/10/17 0620 10/10/17 1057 10/10/17 1200 10/10/17  1400  BP: (!) 118/58 138/77 123/67 115/62  Pulse: 87 66 (!) 58 (!) 56  Resp: 18   18  Temp: 98.6 F (37 C)   99 F (37.2 C)  TempSrc: Oral   Oral  SpO2: 96%   95%  Weight:      Height:        Intake/Output Summary (Last 24 hours) at 10/10/2017 1745 Last data filed at 10/10/2017 1710 Gross per 24 hour  Intake 1791 ml  Output 3200 ml  Net -1409 ml   Filed Weights   10/07/17 1800  Weight: 58.1 kg (128 lb)    Examination:  General exam: Appears calm and comfortable  Respiratory system: Clear to auscultation. Respiratory effort normal. Cardiovascular system: S1 & S2 heard, RRR. No JVD, murmurs,  No pedal edema. Gastrointestinal system: Abdomen is nondistended, soft and nontender. No organomegaly or masses felt. Normal bowel sounds heard. Central nervous system: Alert and oriented. No focal neurological deficits. Extremities: Symmetric 5 x 5 power. Skin: No rashes, lesions or ulcers Psychiatry: Judgement and insight appear normal. Mood & affect appropriate.     Data Reviewed: I have personally reviewed following labs and imaging studies  CBC: Recent Labs  Lab 10/05/17 1717 10/06/17 0521 10/08/17 1021  WBC 13.6* 17.5* 8.9  HGB 15.1* 13.0 11.9*  HCT 45.7 39.9 36.5  MCV 95.8 97.1 97.9  PLT 317 260 485   Basic Metabolic Panel: Recent Labs  Lab 10/05/17 1717 10/06/17 0521 10/08/17 1021 10/09/17 0426  NA 134* 135 138 138  K 3.9 3.9 4.2 3.9  CL 97* 103 107 109  CO2 24 21* 23 21*  GLUCOSE 114* 117* 147* 114*  BUN 8 7 9  6  CREATININE 0.64 0.75 0.88 0.68  CALCIUM 9.0 7.8* 8.0* 7.7*   GFR: Estimated Creatinine Clearance: 54 mL/min (by C-G formula based on SCr of 0.68 mg/dL). Liver Function Tests: Recent Labs  Lab 10/05/17 1717  AST 16  ALT 13*  ALKPHOS 80  BILITOT 0.6  PROT 7.2  ALBUMIN 3.9   Recent Labs  Lab 10/05/17 1717  LIPASE 33   No results for input(s): AMMONIA in the last 168 hours. Coagulation Profile: Recent Labs  Lab 10/05/17 2011    INR 0.96   Cardiac Enzymes: Recent Labs  Lab 10/07/17 1454 10/09/17 1256 10/10/17 1437  TROPONINI <0.03 <0.03 <0.03   BNP (last 3 results) No results for input(s): PROBNP in the last 8760 hours. HbA1C: No results for input(s): HGBA1C in the last 72 hours. CBG: No results for input(s): GLUCAP in the last 168 hours. Lipid Profile: No results for input(s): CHOL, HDL, LDLCALC, TRIG, CHOLHDL, LDLDIRECT in the last 72 hours. Thyroid Function Tests: No results for input(s): TSH, T4TOTAL, FREET4, T3FREE, THYROIDAB in the last 72 hours. Anemia Panel: No results for input(s): VITAMINB12, FOLATE, FERRITIN, TIBC, IRON, RETICCTPCT in the last 72 hours. Sepsis Labs: No results for input(s): PROCALCITON, LATICACIDVEN in the last 168 hours.  Recent Results (from the past 240 hour(s))  Blood culture (routine x 2)     Status: None (Preliminary result)   Collection Time: 10/05/17  9:13 PM  Result Value Ref Range Status   Specimen Description   Final    BLOOD RIGHT ANTECUBITAL Performed at [facility] [address] [place], [place] [address]    Special Requests   Final    BOTTLES DRAWN AEROBIC AND ANAEROBIC Blood Culture adequate volume Performed at [place] [address] [place], [place] [address]    Culture   Final    NO GROWTH 4 DAYS Performed at [facility], [place] [address], [place], [place] [address]    Report Status PENDING  Incomplete  Blood culture (routine x 2)     Status: None (Preliminary result)   Collection Time: 10/05/17  9:39 PM  Result Value Ref Range Status   Specimen Description   Final    BLOOD RIGHT HAND Performed at [facility] [address], [place], [place] [address]    Special Requests   Final    IN PEDIATRIC BOTTLE Blood Culture adequate volume Performed at [place] [address], [place], [place] [address]    Culture   Final    NO GROWTH 4 DAYS Performed  at [facility], [place] [address] [place], [place] [address]    Report Status PENDING  Incomplete  Culture, Urine     Status: None   Collection Time: 10/08/17 10:15 AM  Result Value Ref Range Status   Specimen Description   Final    URINE, CATHETERIZED Performed at [place] [address] [place], [place] [address]    Special Requests   Final    NONE Performed at [facility], [place] [address], [place],  [address]    Culture   Final    NO GROWTH Performed at [facility], [place] [address], [place],  [address]    Report Status 10/09/2017 FINAL  Final         Radiology Studies: Ct Angio Chest Pe W Or Wo Contrast  Result Date: 10/09/2017 CLINICAL DATA:  Cough; lu cp since yesterdaySOB on  exertionHx bil breast ca--surg only EXAM: CT ANGIOGRAPHY CHEST WITH CONTRAST TECHNIQUE: Multidetector CT imaging of the chest was performed using the standard protocol during bolus administration of intravenous contrast. Multiplanar CT image reconstructions and MIPs were obtained to evaluate the vascular anatomy. CONTRAST:  126mL ISOVUE-370 IOPAMIDOL (ISOVUE-370) INJECTION 76% COMPARISON:  Current chest radiograph. Abdomen and pelvis CT, 10/05/2017. FINDINGS: Cardiovascular: Satisfactory opacification of the pulmonary arteries to the segmental level. No evidence of pulmonary embolism. Heart top-normal in size. No pericardial effusion. Mild three-vessel coronary artery calcifications. Great vessels are normal in caliber. Atherosclerotic calcifications are noted along the aortic arch and descending thoracic aorta. Mediastinum/Nodes: No neck base or axillary masses or pathologically enlarged lymph nodes. There are scattered subcentimeter shotty mediastinal lymph nodes. A subcarinal lymph node is mildly enlarged measuring 1.6 cm in short axis. No other enlarged lymph nodes. No discrete masses. No hilar masses or pathologically enlarged lymph nodes.  Trachea is patent. Esophagus is unremarkable. Lungs/Pleura: Small to moderate right and small left pleural effusions. Mild interstitial thickening noted in the upper lungs. There is dependent opacity most evident in the lower lobes, a combination of ground-glass type opacity and more confluent opacity adjacent to the pleural effusions. This may all reflect atelectasis. A component of infection or asymmetric edema is possible. Small nodule in the right lower lobe adjacent to the oblique fissure, image 61, series 10, measuring 4 mm. No other discrete nodules. No pneumothorax. Upper Abdomen: Low-density liver lesions as noted on the recent prior CT, likely cysts. No acute findings. Musculoskeletal: No fracture or acute finding. No osteoblastic or osteolytic lesions. Review of the MIP images confirms the above findings. IMPRESSION: 1. Small to moderate right and small left pleural effusions with associated dependent atelectasis. Additional ground-glass type opacity in the posterior lungs is most likely additional atelectasis or mild asymmetric pulmonary edema. Infection is possible. Suspect a mild degree of congestive heart failure/fluid overload in combination with atelectasis and pleural fluid. 2. 4 mm nodule in the right lower lobe. No follow-up needed if patient is low-risk. Non-contrast chest CT can be considered in 12 months if patient is high-risk. This recommendation follows the consensus statement: Guidelines for Management of Incidental Pulmonary Nodules Detected on CT Images: From the Fleischner Society 2017; Radiology 2017; 284:228-243. Aortic Atherosclerosis (ICD10-I70.0). Electronically Signed   By: Lajean Manes M.D.   On: 10/09/2017 12:55   Dg Chest Port 1 View  Result Date: 10/09/2017 CLINICAL DATA:  Cough, hypoxia, chest discomfort. History of atrial fibrillation, previous CVA. EXAM: PORTABLE CHEST 1 VIEW COMPARISON:  Chest x-ray of April 11, 2017 FINDINGS: The lungs are adequately inflated. The  interstitial markings are coarse. There is a small left pleural effusion and probable tiny right pleural effusion. The heart is mildly enlarged. The central pulmonary vascularity is prominent. The trachea is midline. There is calcification in the wall of the aortic arch. IMPRESSION: Chronic bronchitic changes, stable. Mild central pulmonary vascular congestion. Small left-sided pleural effusion. Thoracic aortic atherosclerosis. Electronically Signed   By: David  Martinique M.D.   On: 10/09/2017 09:38        Scheduled Meds: . amLODipine  5 mg Oral Daily  . aspirin  81 mg Oral Daily  . atenolol  25 mg Oral Daily  . docusate sodium  100 mg Oral BID  . enoxaparin (LOVENOX) injection  40 mg Subcutaneous Daily  . folic acid  1 mg Oral Daily  . gabapentin  100 mg Oral BID  . multivitamin with minerals  1 tablet  Oral Daily  . pantoprazole  40 mg Oral Daily  . tamsulosin  0.4 mg Oral Daily  . thiamine  100 mg Oral Daily   Or  . thiamine  100 mg Intravenous Daily   Continuous Infusions: . 0.9 % NaCl with KCl 20 mEq / L 10 mL/hr at 10/10/17 1400     LOS: 2 days    Time spent: 40 minutes    Hosie Poisson, MD [facility] Pager [phone] If 7PM-7AM, please contact night-coverage [email or web address] Password [facility] 10/10/2017, 5:45 PM

## 2017-10-10 NOTE — Progress Notes (Signed)
  Echocardiogram 2D Echocardiogram has been performed.  Darlina Sicilian M 10/10/2017, 3:52 PM

## 2017-10-10 NOTE — Discharge Instructions (Signed)
Please arrive at least 30 min before your appointment to complete your check in paperwork.  If you are unable to arrive 30 min prior to your appointment time we may have to cancel or reschedule you. ° °LAPAROSCOPIC SURGERY: POST OP INSTRUCTIONS  °1. DIET: Follow a light bland diet the first 24 hours after arrival home, such as soup, liquids, crackers, etc. Be sure to include lots of fluids daily. Avoid fast food or heavy meals as your are more likely to get nauseated. Eat a low fat the next few days after surgery.  °2. Take your usually prescribed home medications unless otherwise directed. °3. PAIN CONTROL:  °1. Pain is best controlled by a usual combination of three different methods TOGETHER:  °1. Ice/Heat °2. Over the counter pain medication °3. Prescription pain medication °2. Most patients will experience some swelling and bruising around the incisions. Ice packs or heating pads (30-60 minutes up to 6 times a day) will help. Use ice for the first few days to help decrease swelling and bruising, then switch to heat to help relax tight/sore spots and speed recovery. Some people prefer to use ice alone, heat alone, alternating between ice & heat. Experiment to what works for you. Swelling and bruising can take several weeks to resolve.  °3. It is helpful to take an over-the-counter pain medication regularly for the first few weeks. Choose one of the following that works best for you:  °1. Naproxen (Aleve, etc) Two 220mg tabs twice a day °2. Ibuprofen (Advil, etc) Three 200mg tabs four times a day (every meal & bedtime) °3. Acetaminophen (Tylenol, etc) 500-650mg four times a day (every meal & bedtime) °4. A prescription for pain medication (such as oxycodone, hydrocodone, etc) should be given to you upon discharge. Take your pain medication as prescribed.  °1. If you are having problems/concerns with the prescription medicine (does not control pain, nausea, vomiting, rash, itching, etc), please call us [phone]  [phone] to see if we need to switch you to a different pain medicine that will work better for you and/or control your side effect better. °2. If you need a refill on your pain medication, please contact your pharmacy. They will contact our office to request authorization. Prescriptions will not be filled after 5 pm or on week-ends. °4. Avoid getting constipated. Between the surgery and the pain medications, it is common to experience some constipation. Increasing fluid intake and taking a fiber supplement (such as Metamucil, Citrucel, FiberCon, MiraLax, etc) 1-2 times a day regularly will usually help prevent this problem from occurring. A mild laxative (prune juice, Milk of Magnesia, MiraLax, etc) should be taken according to package directions if there are no bowel movements after 48 hours.  °5. Watch out for diarrhea. If you have many loose bowel movements, simplify your diet to bland foods & liquids for a few days. Stop any stool softeners and decrease your fiber supplement. Switching to mild anti-diarrheal medications (Kayopectate, Pepto Bismol) can help. If this worsens or does not improve, please call us. °6. Wash / shower every day. You may shower over the dressings as they are waterproof. Continue to shower over incision(s) after the dressing is off. °7. Remove your waterproof bandages 5 days after surgery. You may leave the incision open to air. You may replace a dressing/Band-Aid to cover the incision for comfort if you wish.  °8. ACTIVITIES as tolerated:  °1. You may resume regular (light) daily activities beginning the next day--such as daily self-care, walking, climbing stairs--gradually   increasing activities as tolerated. If you can walk 30 minutes without difficulty, it is safe to try more intense activity such as jogging, treadmill, bicycling, low-impact aerobics, swimming, etc. °2. Save the most intensive and strenuous activity for last such as sit-ups, heavy lifting, contact sports, etc Refrain  from any heavy lifting or straining until you are off narcotics for pain control.  °3. DO NOT PUSH THROUGH PAIN. Let pain be your guide: If it hurts to do something, don't do it. Pain is your body warning you to avoid that activity for another week until the pain goes down. °4. You may drive when you are no longer taking prescription pain medication, you can comfortably wear a seatbelt, and you can safely maneuver your car and apply brakes. °5. You may have sexual intercourse when it is comfortable.  °9. FOLLOW UP in our office  °1. Please call [facility] at [phone] to set up an appointment to see your surgeon in the office for a follow-up appointment approximately 2-3 weeks after your surgery. °2. Make sure that you call for this appointment the day you arrive home to insure a convenient appointment time. °     10. IF YOU HAVE DISABILITY OR FAMILY LEAVE FORMS, BRING THEM TO THE               OFFICE FOR PROCESSING.  ° °WHEN TO CALL US [phone]:  °1. Poor pain control °2. Reactions / problems with new medications (rash/itching, nausea, etc)  °3. Fever over 101.5 F (38.5 C) °4. Inability to urinate °5. Nausea and/or vomiting °6. Worsening swelling or bruising °7. Continued bleeding from incision. °8. Increased pain, redness, or drainage from the incision ° °The clinic staff is available to answer your questions during regular business hours (8:30am-5pm). Please don’t hesitate to call and ask to speak to one of our nurses for clinical concerns.  °If you have a medical emergency, go to the nearest emergency room or call 911.  °A surgeon from [facility] is always on call at the hospitals  ° °[facility]  °[address], [address], [place], [place] [address] ?  °MAIN: [phone] ? TOLL FREE: [phone] ?  °FAX [phone]  °[email or web address] ° °

## 2017-10-10 NOTE — Progress Notes (Signed)
Pt complaining of left chest pain/pressure.  No numbness or tingling in arms or jaw. No back pain.  PA notified, EKG ordered, will continue to monitor.

## 2017-10-10 NOTE — Consult Note (Addendum)
Cardiology Consultation:   Patient ID: [name]; [id number]; 06-Mar-1941   Admit date: 10/05/2017 Date of Consult: 10/10/2017  Primary Care Provider: Josetta Huddle, MD Primary Cardiologist: [name] Primary Electrophysiologist:  None   Patient Profile:   [name] is a 77 y.o. female with a hx of acute appendicitis treated surgically on 10/05/17 who is being seen today for the evaluation of chest pain at the request of Melanie Billings A.  Rayburn PA-C for Dr.  Greer Paul.  There is no prior history of cardiac disease other than paroxysmal atrial fibrillation.  Prior history of CVA, hypertension, and alcohol abuse.  History of Present Illness:   [name] was admitted to the hospital on 10/05/17 with abdominal pain and subsequently underwent laparoscopic appendectomy for acute appendicitis.  She has had a relatively uneventful recuperative course.  Earlier this morning she began complaining of soreness in the chest.  She describes the discomfort is been in the left lateral chest and axillary region with radiation into the left precordium.  States she has had similar discomfort in the remote past on the right chest.  The discomfort also moves into the sternal area.  The sternal and left precordial area are also tender to touch but different than her complaint.  She denies dyspnea, radiation to the arm, and abdominal pain.  Past Medical History:  Diagnosis Date  . Alcohol abuse   . Anemia   . Arthritis   . Bacterial vaginosis   . Cancer ([place]) 01/2015   Breast cancer right   . Cerebrovascular accident (stroke) ([place]) 2/07   left frontal lobe  . Closed left hip fracture ([place]) 5/14  . Complication of anesthesia    woke up too early  with kidney stone surgery and wrist surgery  . Depression   . Dupuytren's contracture   . Fibrocystic breast disease   . Fracture of metatarsal bone of right foot    5th metatarsal  . GERD (gastroesophageal reflux disease)   . Gout   . Headache   .  Hyperlipidemia LDL goal <70   . Hypertension   . Paroxysmal A-fib ([place])   . Renal calculus   . Vitamin B12 deficiency   . Vitamin D deficiency     Past Surgical History:  Procedure Laterality Date  . ABDOMINAL HYSTERECTOMY    . cataracts Bilateral   . ESOPHAGEAL MANOMETRY N/A 08/15/2017   Procedure: ESOPHAGEAL MANOMETRY (EM);  Surgeon: Melanie Juniper, MD;  Location: [facility];  Service: Gastroenterology;  Laterality: N/A;  . ESOPHAGOGASTRODUODENOSCOPY (EGD) WITH PROPOFOL N/A 05/02/2016   Procedure: ESOPHAGOGASTRODUODENOSCOPY (EGD) WITH PROPOFOL;  Surgeon: Melanie Fair, MD;  Location: [facility];  Service: Endoscopy;  Laterality: N/A;  . HIP ARTHROPLASTY Left 12/17/2012   Procedure: LEFT HIP HEMI ARTHROPLASTY ;  Surgeon: Melanie Box, MD;  Location: [facility];  Service: Orthopedics;  Laterality: Left;  . LAPAROSCOPIC APPENDECTOMY N/A 10/05/2017   Procedure: APPENDECTOMY LAPAROSCOPIC;  Surgeon: Melanie Pickerel, MD;  Location: [facility];  Service: General;  Laterality: N/A;  . left wrist surgery surgery  7 yrs ago  . LEG SURGERY Left   . LITHOTRIPSY    . MASTECTOMY W/ SENTINEL NODE BIOPSY Bilateral 01/20/2016   Procedure: BILATERAL TOTAL MASTECTOMY WITH RIGHT SENTINEL LYMPH NODE BIOPSY;  Surgeon: Melanie Bookbinder, MD;  Location: [facility];  Service: General;  Laterality: Bilateral;  . WRIST SURGERY     left wrist     Home Medications:  Prior to Admission medications   Medication Sig  Start Date End Date Taking? Authorizing Provider  amLODipine (NORVASC) 5 MG tablet Take 5 mg by mouth daily.   Yes [provider]  atenolol (TENORMIN) 25 MG tablet Take 1 tablet (25 mg total) by mouth daily. 06/06/14  Yes Hongalgi, Lenis Dickinson, MD  Cyanocobalamin (VITAMIN B-12 PO) Take 1 tablet by mouth at bedtime.   Yes [provider]  cephALEXin (KEFLEX) 500 MG capsule Take 1 capsule (500 mg total) by mouth 2 (two) times daily. Patient not taking: Reported on 10/05/2017 04/12/17   Melanie Lis, MD    cholecalciferol (VITAMIN D) 1000 UNITS tablet Take 1,000 Units by mouth once a week.     [provider]  ibuprofen (ADVIL) 200 MG tablet Take 400 mg by mouth every 6 (six) hours as needed for fever, headache or mild pain. Reported on 01/12/2016    [provider]    Inpatient Medications: Scheduled Meds: . amLODipine  5 mg Oral Daily  . atenolol  25 mg Oral Daily  . docusate sodium  100 mg Oral BID  . enoxaparin (LOVENOX) injection  40 mg Subcutaneous Daily  . folic acid  1 mg Oral Daily  . gabapentin  100 mg Oral BID  . multivitamin with minerals  1 tablet Oral Daily  . pantoprazole  40 mg Oral Daily  . tamsulosin  0.4 mg Oral Daily  . thiamine  100 mg Oral Daily   Or  . thiamine  100 mg Intravenous Daily   Continuous Infusions: . 0.9 % NaCl with KCl 20 mEq / L 10 mL/hr at 10/09/17 1514   PRN Meds: acetaminophen, diphenhydrAMINE **OR** diphenhydrAMINE, ondansetron **OR** ondansetron (ZOFRAN) IV, oxyCODONE  Allergies:    Allergies  Allergen Reactions  . Ambien [Zolpidem Tartrate] Other (See Comments)    Causes nightmares    Social History:   Social History   Socioeconomic History  . Marital status: Widowed    Spouse name: Not on file  . Number of children: Not on file  . Years of education: Not on file  . Highest education level: Not on file  Social Needs  . Financial resource strain: Not on file  . Food insecurity - worry: Not on file  . Food insecurity - inability: Not on file  . Transportation needs - medical: Not on file  . Transportation needs - non-medical: Not on file  Occupational History  . Not on file  Tobacco Use  . Smoking status: Never Smoker  . Smokeless tobacco: Current User    Types: Chew  Substance and Sexual Activity  . Alcohol use: Yes    Comment: daily -drinks a quart of beer  . Drug use: No  . Sexual activity: Not on file  Other Topics Concern  . Not on file  Social History Narrative  . Not on file    Family  History:    Family History  Problem Relation Age of Onset  . Stroke Mother      ROS:  Please see the history of present illness.  Decreased memory, contracture in her right hand, history of recurring headaches, esophageal reflux, with known esophageal stricture. All other ROS reviewed and negative.     Physical Exam/Data:   Vitals:   10/09/17 2104 10/10/17 0620 10/10/17 1057 10/10/17 1200  BP:  (!) 118/58 138/77 123/67  Pulse:  87 66 (!) 58  Resp: 18 18    Temp: 98.7 F (37.1 C) 98.6 F (37 C)    TempSrc: Oral Oral  SpO2: 92% 96%    Weight:      Height:        Intake/Output Summary (Last 24 hours) at 10/10/2017 1410 Last data filed at 10/10/2017 1106 Gross per 24 hour  Intake 1784.17 ml  Output 3175 ml  Net -1390.83 ml   Filed Weights   10/07/17 1800  Weight: 128 lb (58.1 kg)   Body mass index is 18.37 kg/m.  General:  Well nourished, well developed, in no acute distress. HEENT: normal Lymph: no adenopathy Neck: no JVD Endocrine:  No thryomegaly Vascular: No carotid bruits; FA pulses 2+ bilaterally without bruits  Cardiac:  normal S1, S2; RRR; no murmur. Lungs:  clear to auscultation bilaterally, no wheezing, rhonchi or rales.  Evidence of prior bilateral mastectomy. Abd: soft, nontender, no hepatomegaly  Ext: no edema Musculoskeletal:  No deformities, BUE and BLE strength normal and equal Skin: warm and dry  Neuro:  CNs 2-12 intact, no focal abnormalities noted Psych:  Normal affect   EKG:  The EKG was personally reviewed and demonstrates: Sinus rhythm, tall R wave in V1, and no ischemic ST-T wave change.  When compared to prior tracings right bundle branch block is not currently present. Telemetry:  Telemetry was personally reviewed and demonstrates: No data  Relevant CV Studies:  Echocardiogram April 11, 2017: Study Conclusions   - Left ventricle: The cavity size was normal. Wall thickness was   normal. Systolic function was normal. The estimated  ejection   fraction was in the range of 60% to 65%. Wall motion was normal;   there were no regional wall motion abnormalities. Features are   consistent with a pseudonormal left ventricular filling pattern,   with concomitant abnormal relaxation and increased filling   pressure (grade 2 diastolic dysfunction).   Laboratory Data:  Chemistry Recent Labs  Lab 10/06/17 0521 10/08/17 1021 10/09/17 0426  NA 135 138 138  K 3.9 4.2 3.9  CL 103 107 109  CO2 21* 23 21*  GLUCOSE 117* 147* 114*  BUN 7 9 6   CREATININE 0.75 0.88 0.68  CALCIUM 7.8* 8.0* 7.7*  GFRNONAA >60 >60 >60  GFRAA >60 >60 >60  ANIONGAP 11 8 8     Recent Labs  Lab 10/05/17 1717  PROT 7.2  ALBUMIN 3.9  AST 16  ALT 13*  ALKPHOS 80  BILITOT 0.6   Hematology Recent Labs  Lab 10/05/17 1717 10/06/17 0521 10/08/17 1021  WBC 13.6* 17.5* 8.9  RBC 4.77 4.11 3.73*  HGB 15.1* 13.0 11.9*  HCT 45.7 39.9 36.5  MCV 95.8 97.1 97.9  MCH 31.7 31.6 31.9  MCHC 33.0 32.6 32.6  RDW 12.3 12.6 12.5  PLT 317 260 200   Cardiac Enzymes Recent Labs  Lab 10/07/17 1454 10/09/17 1256  TROPONINI <0.03 <0.03   No results for input(s): TROPIPOC in the last 168 hours.  BNP Recent Labs  Lab 10/09/17 1530  BNP 299.7*    DDimer No results for input(s): DDIMER in the last 168 hours.  Radiology/Studies:  Ct Angio Chest Pe W Or Wo Contrast  Result Date: 10/09/2017 CLINICAL DATA:  Cough; lu cp since yesterdaySOB on exertionHx bil breast ca--surg only EXAM: CT ANGIOGRAPHY CHEST WITH CONTRAST TECHNIQUE: Multidetector CT imaging of the chest was performed using the standard protocol during bolus administration of intravenous contrast. Multiplanar CT image reconstructions and MIPs were obtained to evaluate the vascular anatomy. CONTRAST:  154mL ISOVUE-370 IOPAMIDOL (ISOVUE-370) INJECTION 76% COMPARISON:  Current chest radiograph. Abdomen and pelvis CT, 10/05/2017. FINDINGS: Cardiovascular:  Satisfactory opacification of the pulmonary  arteries to the segmental level. No evidence of pulmonary embolism. Heart top-normal in size. No pericardial effusion. Mild three-vessel coronary artery calcifications. Great vessels are normal in caliber. Atherosclerotic calcifications are noted along the aortic arch and descending thoracic aorta. Mediastinum/Nodes: No neck base or axillary masses or pathologically enlarged lymph nodes. There are scattered subcentimeter shotty mediastinal lymph nodes. A subcarinal lymph node is mildly enlarged measuring 1.6 cm in short axis. No other enlarged lymph nodes. No discrete masses. No hilar masses or pathologically enlarged lymph nodes. Trachea is patent. Esophagus is unremarkable. Lungs/Pleura: Small to moderate right and small left pleural effusions. Mild interstitial thickening noted in the upper lungs. There is dependent opacity most evident in the lower lobes, a combination of ground-glass type opacity and more confluent opacity adjacent to the pleural effusions. This may all reflect atelectasis. A component of infection or asymmetric edema is possible. Small nodule in the right lower lobe adjacent to the oblique fissure, image 61, series 10, measuring 4 mm. No other discrete nodules. No pneumothorax. Upper Abdomen: Low-density liver lesions as noted on the recent prior CT, likely cysts. No acute findings. Musculoskeletal: No fracture or acute finding. No osteoblastic or osteolytic lesions. Review of the MIP images confirms the above findings. IMPRESSION: 1. Small to moderate right and small left pleural effusions with associated dependent atelectasis. Additional ground-glass type opacity in the posterior lungs is most likely additional atelectasis or mild asymmetric pulmonary edema. Infection is possible. Suspect a mild degree of congestive heart failure/fluid overload in combination with atelectasis and pleural fluid. 2. 4 mm nodule in the right lower lobe. No follow-up needed if patient is low-risk. Non-contrast  chest CT can be considered in 12 months if patient is high-risk. This recommendation follows the consensus statement: Guidelines for Management of Incidental Pulmonary Nodules Detected on CT Images: From the Fleischner Society 2017; Radiology 2017; 284:228-243. Aortic Atherosclerosis (ICD10-I70.0). Electronically Signed   By: Lajean Manes M.D.   On: 10/09/2017 12:55   US Renal  Result Date: 10/08/2017 CLINICAL DATA:  Urinary retention and hypertension EXAM: RENAL / URINARY TRACT ULTRASOUND COMPLETE COMPARISON:  CT abdomen and pelvis October 05, 2017 FINDINGS: Right Kidney: Length: 9.5 cm. Echogenicity and renal cortical thickness are within normal limits. No perinephric fluid or hydronephrosis visualized. There is a cyst arising from the mid to lower pole right kidney measuring 0.7 x 0.7 x 0.9 cm. No sonographically demonstrable calculus or ureterectasis. Left Kidney: Length: 9.6 cm. Echogenicity okay mm antrum with is within normal limits. No perinephric fluid or hydronephrosis visualized. There is an 8 x 8 x 8 mm cyst in the medial mid left kidney. There is a calculus measuring 8 mm in the upper pole region. There is a 1.3 cm calculus in the mid left kidney. No ureterectasis. Bladder: Appears normal for degree of bladder distention. IMPRESSION: Nonobstructing calculi left kidney. Small cyst in each kidney. No obstructing focus in either kidney. Renal cortical thickness and echogenicity within normal limits bilaterally. Electronically Signed   By: Lowella Grip III M.D.   On: 10/08/2017 11:53   Dg Chest Port 1 View  Result Date: 10/09/2017 CLINICAL DATA:  Cough, hypoxia, chest discomfort. History of atrial fibrillation, previous CVA. EXAM: PORTABLE CHEST 1 VIEW COMPARISON:  Chest x-ray of April 11, 2017 FINDINGS: The lungs are adequately inflated. The interstitial markings are coarse. There is a small left pleural effusion and probable tiny right pleural effusion. The heart is mildly enlarged. The  central  pulmonary vascularity is prominent. The trachea is midline. There is calcification in the wall of the aortic arch. IMPRESSION: Chronic bronchitic changes, stable. Mild central pulmonary vascular congestion. Small left-sided pleural effusion. Thoracic aortic atherosclerosis. Electronically Signed   By: David  Martinique M.D.   On: 10/09/2017 09:38    Assessment and Plan:   1. Chest pain of uncertain etiology.  EKG and cardiac markers do not suggest acute coronary syndrome.  Does have bilateral pleural effusions on chest x-ray.  Chest discomfort could be related to pleuritis if effusions are inflammatory.  Repeat serial cardiac markers to rule out infarction.  Will consider performing a myocardial perfusion study to exclude ischemia.  Start aspirin 81 mg/day if safe. 2. Coronary artery disease denoted by the presence of three-vessel calcification on chest CT scan performed 10/09/2017. 3. Pulmonary congestion noted on chest CT along with effusions could be consistent with acute on chronic diastolic heart failure.  Rule out systolic dysfunction.  Repeat 2D Doppler echocardiogram.  Will give 1 dose of IV Lasix.  Follow kidney function. May need thoracentesis.   For questions or updates, please contact [facility] Please consult [email or web address] for contact info under Cardiology/STEMI.   Signed, Sinclair Grooms, MD  10/10/2017 2:10 PM

## 2017-10-10 NOTE — Progress Notes (Signed)
Pt said she was too tired to walk in the hall today. Stated she felt weak.

## 2017-10-11 ENCOUNTER — Ambulatory Visit (HOSPITAL_COMMUNITY)
Admit: 2017-10-11 | Discharge: 2017-10-11 | Disposition: A | Discharge status: Home / Self Care | Payer: Medicare HMO | Source: Ambulatory Visit | Attending: Interventional Cardiology | Admitting: Interventional Cardiology

## 2017-10-11 ENCOUNTER — Other Ambulatory Visit: Payer: Self-pay | Admitting: Internal Medicine

## 2017-10-11 DIAGNOSIS — Z853 Personal history of malignant neoplasm of breast: Secondary | ICD-10-CM | POA: Diagnosis not present

## 2017-10-11 DIAGNOSIS — E538 Deficiency of other specified B group vitamins: Secondary | ICD-10-CM | POA: Diagnosis not present

## 2017-10-11 DIAGNOSIS — Z09 Encounter for follow-up examination after completed treatment for conditions other than malignant neoplasm: Secondary | ICD-10-CM | POA: Diagnosis not present

## 2017-10-11 DIAGNOSIS — I10 Essential (primary) hypertension: Secondary | ICD-10-CM | POA: Diagnosis not present

## 2017-10-11 DIAGNOSIS — E785 Hyperlipidemia, unspecified: Secondary | ICD-10-CM | POA: Insufficient documentation

## 2017-10-11 DIAGNOSIS — I2510 Atherosclerotic heart disease of native coronary artery without angina pectoris: Secondary | ICD-10-CM | POA: Insufficient documentation

## 2017-10-11 DIAGNOSIS — R079 Chest pain, unspecified: Secondary | ICD-10-CM | POA: Diagnosis not present

## 2017-10-11 DIAGNOSIS — J90 Pleural effusion, not elsewhere classified: Secondary | ICD-10-CM | POA: Diagnosis not present

## 2017-10-11 DIAGNOSIS — Z789 Other specified health status: Secondary | ICD-10-CM | POA: Diagnosis not present

## 2017-10-11 LAB — NM MYOCAR MULTI W/SPECT W/WALL MOTION / EF
CHL CUP MPHR: 143 {beats}/min
CSEPED: 5 min
CSEPPHR: 80 {beats}/min
Estimated workload: 1 METS
Percent HR: 55 %
Rest HR: 63 {beats}/min

## 2017-10-11 LAB — CULTURE, BLOOD (ROUTINE X 2)
CULTURE: NO GROWTH
Culture: NO GROWTH
Special Requests: ADEQUATE
Special Requests: ADEQUATE

## 2017-10-11 LAB — TROPONIN I

## 2017-10-11 MED ORDER — TECHNETIUM TC 99M TETROFOSMIN IV KIT
30.0000 | PACK | Freq: Once | INTRAVENOUS | Status: AC | PRN
  Administered 2017-10-11: 30 via INTRAVENOUS

## 2017-10-11 MED ORDER — REGADENOSON 0.4 MG/5ML IV SOLN
INTRAVENOUS | Status: AC
  Filled 2017-10-11: qty 5

## 2017-10-11 MED ORDER — TECHNETIUM TC 99M TETROFOSMIN IV KIT
10.0000 | PACK | Freq: Once | INTRAVENOUS | Status: AC | PRN
  Administered 2017-10-11: 10 via INTRAVENOUS

## 2017-10-11 MED ORDER — ACETAMINOPHEN 500 MG PO TABS: 500.0000 mg | ORAL_TABLET | Freq: Four times a day (QID) | ORAL | 0 refills | Status: DC | PRN

## 2017-10-11 MED ORDER — ASPIRIN 81 MG PO CHEW: 81.0000 mg | CHEWABLE_TABLET | Freq: Every day | ORAL | Status: DC

## 2017-10-11 MED ORDER — TRAMADOL HCL 50 MG PO TABS: 50.0000 mg | ORAL_TABLET | Freq: Four times a day (QID) | ORAL | 0 refills | Status: DC | PRN

## 2017-10-11 MED ORDER — REGADENOSON 0.4 MG/5ML IV SOLN
0.4000 mg | Freq: Once | INTRAVENOUS | Status: AC
  Administered 2017-10-11: 0.4 mg via INTRAVENOUS

## 2017-10-11 NOTE — Progress Notes (Signed)
[name] a 77 y.o.femalewith medical history significant ofhypertension, proximal A. fib, hyperlipidemia is status post appendectomy presents with difficulty breathing and sharp chest pain and she was found to be hypoxic.  Hospitalist requested for medical consult for further evaluation.  Chest x-ray showed pleural effusions but no infiltrates.  CT angiogram of the chest does not show any pulmonary infarcts shows bilateral pleural effusions and coronary calcification.  Today she denies any complaints of chest pain or shortness of breath.  She underwent NST ordered by cardiology which came out as low RISK.  [facility] will sign off and plan for discharge as per surgery.    Hosie Poisson.  [phone]

## 2017-10-11 NOTE — Care Management Important Message (Signed)
Important Message  Patient Details  Name: [name] MRN: [id number] Date of Birth: 1941/06/18   Medicare Important Message Given:  Yes    Kerin Salen 10/11/2017, 10:27 AMImportant Message  Patient Details  Name: [name] MRN: [id number] Date of Birth: 1940/12/31   Medicare Important Message Given:  Yes    Kerin Salen 10/11/2017, 10:27 AM

## 2017-10-11 NOTE — Discharge Summary (Addendum)
[facility] Discharge Summary   Patient ID: [name] MRN: [id number] DOB/AGE: [birth year] 77 y.o.  Admit date: 10/05/2017 Discharge date: 10/11/2017  Admitting Diagnosis: Acute appendicitis  Discharge Diagnosis S/P laparoscopic appendectomy Acute urinary retention - resolved RBBB - resolved  Consultants Internal medicine Cardiology  Imaging: Ct Angio Chest Pe W Or Wo Contrast  Result Date: 10/09/2017 CLINICAL DATA:  Cough; lu cp since yesterdaySOB on exertionHx bil breast ca--surg only EXAM: CT ANGIOGRAPHY CHEST WITH CONTRAST TECHNIQUE: Multidetector CT imaging of the chest was performed using the standard protocol during bolus administration of intravenous contrast. Multiplanar CT image reconstructions and MIPs were obtained to evaluate the vascular anatomy. CONTRAST:  141mL ISOVUE-370 IOPAMIDOL (ISOVUE-370) INJECTION 76% COMPARISON:  Current chest radiograph. Abdomen and pelvis CT, 10/05/2017. FINDINGS: Cardiovascular: Satisfactory opacification of the pulmonary arteries to the segmental level. No evidence of pulmonary embolism. Heart top-normal in size. No pericardial effusion. Mild three-vessel coronary artery calcifications. Great vessels are normal in caliber. Atherosclerotic calcifications are noted along the aortic arch and descending thoracic aorta. Mediastinum/Nodes: No neck base or axillary masses or pathologically enlarged lymph nodes. There are scattered subcentimeter shotty mediastinal lymph nodes. A subcarinal lymph node is mildly enlarged measuring 1.6 cm in short axis. No other enlarged lymph nodes. No discrete masses. No hilar masses or pathologically enlarged lymph nodes. Trachea is patent. Esophagus is unremarkable. Lungs/Pleura: Small to moderate right and small left pleural effusions. Mild interstitial thickening noted in the upper lungs. There is dependent opacity most evident in the lower lobes, a combination of ground-glass type opacity and more  confluent opacity adjacent to the pleural effusions. This may all reflect atelectasis. A component of infection or asymmetric edema is possible. Small nodule in the right lower lobe adjacent to the oblique fissure, image 61, series 10, measuring 4 mm. No other discrete nodules. No pneumothorax. Upper Abdomen: Low-density liver lesions as noted on the recent prior CT, likely cysts. No acute findings. Musculoskeletal: No fracture or acute finding. No osteoblastic or osteolytic lesions. Review of the MIP images confirms the above findings. IMPRESSION: 1. Small to moderate right and small left pleural effusions with associated dependent atelectasis. Additional ground-glass type opacity in the posterior lungs is most likely additional atelectasis or mild asymmetric pulmonary edema. Infection is possible. Suspect a mild degree of congestive heart failure/fluid overload in combination with atelectasis and pleural fluid. 2. 4 mm nodule in the right lower lobe. No follow-up needed if patient is low-risk. Non-contrast chest CT can be considered in 12 months if patient is high-risk. This recommendation follows the consensus statement: Guidelines for Management of Incidental Pulmonary Nodules Detected on CT Images: From the Fleischner Society 2017; Radiology 2017; 284:228-243. Aortic Atherosclerosis (ICD10-I70.0). Electronically Signed   By: Lajean Manes M.D.   On: 10/09/2017 12:55   Dg Chest Port 1 View  Result Date: 10/09/2017 CLINICAL DATA:  Cough, hypoxia, chest discomfort. History of atrial fibrillation, previous CVA. EXAM: PORTABLE CHEST 1 VIEW COMPARISON:  Chest x-ray of April 11, 2017 FINDINGS: The lungs are adequately inflated. The interstitial markings are coarse. There is a small left pleural effusion and probable tiny right pleural effusion. The heart is mildly enlarged. The central pulmonary vascularity is prominent. The trachea is midline. There is calcification in the wall of the aortic arch. IMPRESSION:  Chronic bronchitic changes, stable. Mild central pulmonary vascular congestion. Small left-sided pleural effusion. Thoracic aortic atherosclerosis. Electronically Signed   By: Burlie Cajamarca  [place] M.D.   On: 10/09/2017 09:38    Procedures  Dr. Redmond Pulling (10/05/17) - Laparoscopic Appendectomy  Hospital Course:  Patient is a 77 year old female who presented to [facility] with abdominal pain.  Workup showed acute appendicitis.  Patient was admitted and underwent procedure listed above.  Tolerated procedure well and was transferred to the floor.  Diet was advanced as tolerated. Patient developed some acute urinary retention and a foley was placed 2/25. Patient was noted to be mildly hypoxic 2/26, but reported she was not really using her incentive spirometer. Internal medicine consulted. CTA done to rule out PE and was negative, but did show small pleural effusions. BNP was mildly elevated, and it was thought that she may have some very mild congestive heart failure. EKG 2/26 showed a RBBB that was not seen on previous EKGs, cardiology consulted. Echo done 2/27 and was normal. Repeat EKG 2/27 showed NSR. Patient's oxygen saturation improved with one dose lasix and more aggressive incentive spirometry. Foley catheter was removed 2/27 and patient was able to void on her own.   On POD#6, the patient was voiding well, tolerating diet, pain well controlled, vital signs stable, incisions c/d/i and felt stable for discharge home.  Patient will follow up in our office in 2 weeks and knows to call with questions or concerns. She will call to confirm appointment date/time.    Physical Exam: General:  Alert, NAD, pleasant, comfortable Abd:  Soft, ND, mild tenderness, incisions C/D/I  I have personally looked this patient up in the [place] Controlled Substance Database and reviewed their medications.  Allergies as of 10/11/2017      Reactions   Ambien [zolpidem Tartrate] Other (See Comments)   Causes nightmares      Medication List     STOP taking these medications   cephALEXin 500 MG capsule Commonly known as:  KEFLEX     TAKE these medications   acetaminophen 500 MG tablet Commonly known as:  TYLENOL Take 1 tablet (500 mg total) by mouth every 6 (six) hours as needed for mild pain or headache.   ADVIL 200 MG tablet Generic drug:  ibuprofen Take 400 mg by mouth every 6 (six) hours as needed for fever, headache or mild pain. Reported on 01/12/2016   amLODipine 5 MG tablet Commonly known as:  NORVASC Take 5 mg by mouth daily.   aspirin 81 MG chewable tablet Chew 1 tablet (81 mg total) by mouth daily.   atenolol 25 MG tablet Commonly known as:  TENORMIN Take 1 tablet (25 mg total) by mouth daily.   cholecalciferol 1000 units tablet Commonly known as:  VITAMIN D Take 1,000 Units by mouth once a week.   traMADol 50 MG tablet Commonly known as:  ULTRAM Take 1 tablet (50 mg total) by mouth every 6 (six) hours as needed.   VITAMIN B-12 PO Take 1 tablet by mouth at bedtime.        Follow-up Information    [facility] Go on 10/23/2017.   Specialty:  General Surgery Why:  Your appointment is at 10:15 AM. Please arrive 30 min prior to appointment time. Bring photo ID and insurance information.  Contact information: [address] [place] [address] [phone]        Josetta Huddle, MD Follow up.   Specialty:  Internal Medicine Why:  Follow up with your PCP in 4-6 weeks  Contact information: [address] [place] [address] [phone]           Signed: Brigid Re, PA-C  [facility] 10/11/2017, 8:00 AM Pager: [phone] Consults: [phone]  Agree with above.  Alphonsa Overall, MD, [facility] Pager: [phone] Office phone:  [phone]

## 2017-10-11 NOTE — Progress Notes (Addendum)
The patient has been seen in conjunction with Lyda Jester, PAC. All aspects of care have been considered and discussed. The patient has been personally interviewed, examined, and all clinical data has been reviewed.   Nuclear study as low risk.  No evidence of infarction despite continuous chest discomfort.  No further cardiac workup felt indicated at this time.  Agree with plans to discharge.  Progress Note  Patient Name: [name] Date of Encounter: 10/11/2017  Primary Cardiologist: Dr. Tamala Julian   Subjective   Pt denies CP. Tolerated stress test ok.   Inpatient Medications    Scheduled Meds: . amLODipine  5 mg Oral Daily  . aspirin  81 mg Oral Daily  . atenolol  25 mg Oral Daily  . docusate sodium  100 mg Oral BID  . enoxaparin (LOVENOX) injection  40 mg Subcutaneous Daily  . folic acid  1 mg Oral Daily  . gabapentin  100 mg Oral BID  . multivitamin with minerals  1 tablet Oral Daily  . pantoprazole  40 mg Oral Daily  . tamsulosin  0.4 mg Oral Daily  . thiamine  100 mg Oral Daily   Or  . thiamine  100 mg Intravenous Daily   Continuous Infusions: . 0.9 % NaCl with KCl 20 mEq / L 10 mL/hr at 10/10/17 1800   PRN Meds: acetaminophen, diphenhydrAMINE **OR** diphenhydrAMINE, ondansetron **OR** ondansetron (ZOFRAN) IV, oxyCODONE   Vital Signs    Vitals:   10/10/17 1900 10/10/17 2125 10/11/17 0000 10/11/17 0541  BP: 121/69 112/70 116/68 129/74  Pulse: 60 66 68 61  Resp:  17  16  Temp:  99.1 F (37.3 C)  98.7 F (37.1 C)  TempSrc:  Oral  Oral  SpO2:  90%  91%  Weight:      Height:        Intake/Output Summary (Last 24 hours) at 10/11/2017 1202 Last data filed at 10/11/2017 0542 Gross per 24 hour  Intake 1010 ml  Output 2225 ml  Net -1215 ml   Filed Weights   10/07/17 1800  Weight: 128 lb (58.1 kg)    Telemetry    NSR - Personally Reviewed  ECG    NSR  - Personally Reviewed  Physical Exam   GEN: No acute distress. Thin appearing, elderly  female  Neck: No JVD Cardiac: RRR, no murmurs, rubs, or gallops.  Respiratory: Clear to auscultation bilaterally. GI: Soft, nontender, non-distended  MS: No edema; No deformity. Neuro:  Nonfocal  Psych: Normal affect   Labs    Chemistry Recent Labs  Lab 10/05/17 1717 10/06/17 0521 10/08/17 1021 10/09/17 0426  NA 134* 135 138 138  K 3.9 3.9 4.2 3.9  CL 97* 103 107 109  CO2 24 21* 23 21*  GLUCOSE 114* 117* 147* 114*  BUN 8 7 9 6   CREATININE 0.64 0.75 0.88 0.68  CALCIUM 9.0 7.8* 8.0* 7.7*  PROT 7.2  --   --   --   ALBUMIN 3.9  --   --   --   AST 16  --   --   --   ALT 13*  --   --   --   ALKPHOS 80  --   --   --   BILITOT 0.6  --   --   --   GFRNONAA >60 >60 >60 >60  GFRAA >60 >60 >60 >60  ANIONGAP 13 11 8 8      Hematology Recent Labs  Lab 10/05/17 1717 10/06/17  5885 10/08/17 1021  WBC 13.6* 17.5* 8.9  RBC 4.77 4.11 3.73*  HGB 15.1* 13.0 11.9*  HCT 45.7 39.9 36.5  MCV 95.8 97.1 97.9  MCH 31.7 31.6 31.9  MCHC 33.0 32.6 32.6  RDW 12.3 12.6 12.5  PLT 317 260 200    Cardiac Enzymes Recent Labs  Lab 10/09/17 1256 10/10/17 1437 10/10/17 2028 10/11/17 0222  TROPONINI <0.03 <0.03 <0.03 <0.03   No results for input(s): TROPIPOC in the last 168 hours.   BNP Recent Labs  Lab 10/09/17 1530  BNP 299.7*     DDimer No results for input(s): DDIMER in the last 168 hours.   Radiology    Ct Angio Chest Pe W Or Wo Contrast  Result Date: 10/09/2017 CLINICAL DATA:  Cough; lu cp since yesterdaySOB on exertionHx bil breast ca--surg only EXAM: CT ANGIOGRAPHY CHEST WITH CONTRAST TECHNIQUE: Multidetector CT imaging of the chest was performed using the standard protocol during bolus administration of intravenous contrast. Multiplanar CT image reconstructions and MIPs were obtained to evaluate the vascular anatomy. CONTRAST:  137mL ISOVUE-370 IOPAMIDOL (ISOVUE-370) INJECTION 76% COMPARISON:  Current chest radiograph. Abdomen and pelvis CT, 10/05/2017. FINDINGS:  Cardiovascular: Satisfactory opacification of the pulmonary arteries to the segmental level. No evidence of pulmonary embolism. Heart top-normal in size. No pericardial effusion. Mild three-vessel coronary artery calcifications. Great vessels are normal in caliber. Atherosclerotic calcifications are noted along the aortic arch and descending thoracic aorta. Mediastinum/Nodes: No neck base or axillary masses or pathologically enlarged lymph nodes. There are scattered subcentimeter shotty mediastinal lymph nodes. A subcarinal lymph node is mildly enlarged measuring 1.6 cm in short axis. No other enlarged lymph nodes. No discrete masses. No hilar masses or pathologically enlarged lymph nodes. Trachea is patent. Esophagus is unremarkable. Lungs/Pleura: Small to moderate right and small left pleural effusions. Mild interstitial thickening noted in the upper lungs. There is dependent opacity most evident in the lower lobes, a combination of ground-glass type opacity and more confluent opacity adjacent to the pleural effusions. This may all reflect atelectasis. A component of infection or asymmetric edema is possible. Small nodule in the right lower lobe adjacent to the oblique fissure, image 61, series 10, measuring 4 mm. No other discrete nodules. No pneumothorax. Upper Abdomen: Low-density liver lesions as noted on the recent prior CT, likely cysts. No acute findings. Musculoskeletal: No fracture or acute finding. No osteoblastic or osteolytic lesions. Review of the MIP images confirms the above findings. IMPRESSION: 1. Small to moderate right and small left pleural effusions with associated dependent atelectasis. Additional ground-glass type opacity in the posterior lungs is most likely additional atelectasis or mild asymmetric pulmonary edema. Infection is possible. Suspect a mild degree of congestive heart failure/fluid overload in combination with atelectasis and pleural fluid. 2. 4 mm nodule in the right lower  lobe. No follow-up needed if patient is low-risk. Non-contrast chest CT can be considered in 12 months if patient is high-risk. This recommendation follows the consensus statement: Guidelines for Management of Incidental Pulmonary Nodules Detected on CT Images: From the Fleischner Society 2017; Radiology 2017; 284:228-243. Aortic Atherosclerosis (ICD10-I70.0). Electronically Signed   By: Lajean Manes M.D.   On: 10/09/2017 12:55    Cardiac Studies   NST 10/11/17- results pending. GR to read.   2D Echo 10/10/17 Study Conclusions  - Left ventricle: The cavity size was normal. Wall thickness was   increased in a pattern of mild LVH. Systolic function was normal.   The estimated ejection fraction was in the range  of 60% to 65%.   Wall motion was normal; there were no regional wall motion   abnormalities. Features are consistent with a pseudonormal left   ventricular filling pattern, with concomitant abnormal relaxation   and increased filling pressure (grade 2 diastolic dysfunction).   Doppler parameters are consistent with high ventricular filling   pressure. - Aortic valve: Transvalvular velocity was within the normal range.   There was no stenosis. There was no regurgitation. - Aorta: Ascending aortic diameter: 38 mm (S). - Ascending aorta: The ascending aorta was mildly dilated. - Mitral valve: Transvalvular velocity was within the normal range.   There was no evidence for stenosis. There was mild regurgitation. - Right ventricle: The cavity size was normal. Wall thickness was   normal. Systolic function was normal. - Tricuspid valve: There was mild regurgitation. - Pulmonary arteries: Systolic pressure was mildly increased. PA   peak pressure: 44 mm Hg (S).  Patient Profile     [name] is a 77 y.o. female with a hx of acute appendicitis treated surgically on 10/05/17 who is being seen today for the evaluation of chest pain at the request of Claiborne Billings A.  [place] PA-C for Dr.  Greer Pickerel.  There is no prior history of cardiac disease other than paroxysmal atrial fibrillation.  Prior history of CVA, hypertension, and alcohol abuse.  Assessment & Plan    1. Chest Pain of Uncertain Etiology: cardiac enzymes negative. NST has been completed. Radiologist interpretation pending. Dr. Tamala Julian to provide recommendations based on results.   2. Coronary artery disease denoted by the presence of three-vessel calcification on chest CT scan performed 10/09/2017. NST pending.   3. Pulmonary Congestion Noted on Chest CT: effusion also noted. Echo yesterday showed normal LVEF and G2DD. Lasix given yesterday. Good urinary response, 2.6L out yesterday.   For questions or updates, please contact [facility] Please consult [email or web address] for contact info under Cardiology/STEMI.      Signed, Lyda Jester, PA-C  10/11/2017, 12:02 PM

## 2017-10-12 ENCOUNTER — Telehealth: Payer: Self-pay | Admitting: Oncology

## 2017-10-12 NOTE — Telephone Encounter (Signed)
Called patient however he did not answer. I did leave a message regarding 3/11

## 2017-10-21 NOTE — Progress Notes (Signed)
No show

## 2017-10-22 ENCOUNTER — Encounter: Payer: Self-pay | Admitting: Oncology

## 2017-10-22 ENCOUNTER — Ambulatory Visit (HOSPITAL_BASED_OUTPATIENT_CLINIC_OR_DEPARTMENT_OTHER): Payer: Medicare HMO | Admitting: Oncology

## 2017-10-22 DIAGNOSIS — Z170 Estrogen receptor positive status [ER+]: Secondary | ICD-10-CM

## 2017-10-22 DIAGNOSIS — C50411 Malignant neoplasm of upper-outer quadrant of right female breast: Secondary | ICD-10-CM

## 2017-11-01 DIAGNOSIS — H5203 Hypermetropia, bilateral: Secondary | ICD-10-CM | POA: Diagnosis not present

## 2017-11-01 DIAGNOSIS — H524 Presbyopia: Secondary | ICD-10-CM | POA: Diagnosis not present

## 2017-11-13 DIAGNOSIS — E538 Deficiency of other specified B group vitamins: Secondary | ICD-10-CM | POA: Diagnosis not present

## 2017-11-13 DIAGNOSIS — R0789 Other chest pain: Secondary | ICD-10-CM | POA: Diagnosis not present

## 2017-11-13 DIAGNOSIS — R350 Frequency of micturition: Secondary | ICD-10-CM | POA: Diagnosis not present

## 2017-11-13 DIAGNOSIS — K219 Gastro-esophageal reflux disease without esophagitis: Secondary | ICD-10-CM | POA: Diagnosis not present

## 2017-11-13 DIAGNOSIS — K222 Esophageal obstruction: Secondary | ICD-10-CM | POA: Diagnosis not present

## 2017-11-13 DIAGNOSIS — R002 Palpitations: Secondary | ICD-10-CM | POA: Diagnosis not present

## 2017-11-13 DIAGNOSIS — I10 Essential (primary) hypertension: Secondary | ICD-10-CM | POA: Diagnosis not present

## 2017-11-13 DIAGNOSIS — R300 Dysuria: Secondary | ICD-10-CM | POA: Diagnosis not present

## 2017-11-13 DIAGNOSIS — Z7289 Other problems related to lifestyle: Secondary | ICD-10-CM | POA: Diagnosis not present

## 2017-11-15 ENCOUNTER — Encounter: Payer: Self-pay | Admitting: Oncology

## 2017-11-15 ENCOUNTER — Telehealth: Payer: Self-pay

## 2017-11-15 NOTE — Telephone Encounter (Signed)
Faxed notes to [facility]

## 2017-11-19 ENCOUNTER — Ambulatory Visit (INDEPENDENT_AMBULATORY_CARE_PROVIDER_SITE_OTHER): Payer: Medicare HMO | Admitting: Cardiology

## 2017-11-19 ENCOUNTER — Encounter: Payer: Self-pay | Admitting: Cardiology

## 2017-11-19 VITALS — BP 124/60 | HR 63 | Ht 70.0 in | Wt 127.4 lb

## 2017-11-19 DIAGNOSIS — R002 Palpitations: Secondary | ICD-10-CM | POA: Diagnosis not present

## 2017-11-19 DIAGNOSIS — I480 Paroxysmal atrial fibrillation: Secondary | ICD-10-CM

## 2017-11-19 DIAGNOSIS — E785 Hyperlipidemia, unspecified: Secondary | ICD-10-CM

## 2017-11-19 DIAGNOSIS — F1010 Alcohol abuse, uncomplicated: Secondary | ICD-10-CM

## 2017-11-19 DIAGNOSIS — K222 Esophageal obstruction: Secondary | ICD-10-CM | POA: Diagnosis not present

## 2017-11-19 DIAGNOSIS — R0789 Other chest pain: Secondary | ICD-10-CM | POA: Diagnosis not present

## 2017-11-19 DIAGNOSIS — E782 Mixed hyperlipidemia: Secondary | ICD-10-CM | POA: Diagnosis not present

## 2017-11-19 DIAGNOSIS — Z853 Personal history of malignant neoplasm of breast: Secondary | ICD-10-CM | POA: Diagnosis not present

## 2017-11-19 DIAGNOSIS — I10 Essential (primary) hypertension: Secondary | ICD-10-CM | POA: Diagnosis not present

## 2017-11-19 NOTE — Addendum Note (Signed)
Addended by: Austin Miles on: 11/19/2017 02:16 PM   Modules accepted: Orders

## 2017-11-19 NOTE — Patient Instructions (Signed)
Medication Instructions:  Your physician recommends that you continue on your current medications as directed. Please refer to the Current Medication list given to you today.   Labwork: Your physician recommends that you return for lab work when you come back to the office for your holter monitor appointment: lipid panel.   Testing/Procedures: You had an EKG today.   Follow-Up: Your physician recommends that you schedule a follow-up appointment in: 1 month.   Any Other Special Instructions Will Be Listed Below (If Applicable).     If you need a refill on your cardiac medications before your next appointment, please call your pharmacy.

## 2017-11-19 NOTE — Progress Notes (Signed)
Cardiology Consultation:    Date:  11/19/2017   ID:  [name], [name] 1941-04-07, MRN [id number]  PCP:  Josetta Huddle, MD  Cardiologist:  Jenne Campus, MD   Referring MD: Josetta Huddle, MD   Chief Complaint  Patient presents with  . Coronary Ischemia  Palpitations  History of Present Illness:    [name] is a 77 y.o. female who is being seen today for the evaluation of dictations and atypical chest pain at the request of Josetta Huddle, MD.  She is a 77 years old lady with past medical history significant for EtOH abuse, recent admission to [place] in the hospital for acute appendicitis that required appendectomy.  She also got remote history of paroxysmal atrial fibrillation apparently documented in 2014.  She does not remember that event.  She presented to my office because of palpitations that happening every 2 or 3 days especially at evening time when she is trying to lay down she will feel her heart skipping and spitting up.  There is no shortness of breath no sweating associated with this sensation but reports to have some uneasy sensation in the chest that she grades scale up to 10 x 6.  Typically that sensation goes away by itself eventually.  She does not have any dizziness or passing out.  Her ability to exercise is somewhat limited.  However does not report to have any symptoms of chest pain tightness squeezing pressure burning chest while exercising.  She does have some history of fall.  Couple months ago she fell down and sustained injury to her right ribs.  Also she ended up going to hospital a few months ago with another episode of fall.  It looks like there is no syncope at least recently.  She does have history of drinking however she tells me that she does not drink anymore.  She is to drink beer.  Past Medical History:  Diagnosis Date  . Alcohol abuse   . Anemia   . Arthritis   . Bacterial vaginosis   . Cancer ([place]) 01/2015   Breast cancer right   .  Cerebrovascular accident (stroke) ([place]) 2/07   left frontal lobe  . Closed left hip fracture ([place]) 5/14  . Complication of anesthesia    woke up too early  with kidney stone surgery and wrist surgery  . Depression   . Dupuytren's contracture   . Fibrocystic breast disease   . Fracture of metatarsal bone of right foot    5th metatarsal  . GERD (gastroesophageal reflux disease)   . Gout   . Headache   . Hyperlipidemia LDL goal <70   . Hypertension   . Paroxysmal A-fib ([place])   . Renal calculus   . Vitamin B12 deficiency   . Vitamin D deficiency     Past Surgical History:  Procedure Laterality Date  . ABDOMINAL HYSTERECTOMY    . cataracts Bilateral   . ESOPHAGEAL MANOMETRY N/A 08/15/2017   Procedure: ESOPHAGEAL MANOMETRY (EM);  Surgeon: Ronnette Juniper, MD;  [facility];  Service: Gastroenterology;  Laterality: N/A;  . ESOPHAGOGASTRODUODENOSCOPY (EGD) WITH PROPOFOL N/A 05/02/2016   Procedure: ESOPHAGOGASTRODUODENOSCOPY (EGD) WITH PROPOFOL;  Surgeon: Garlan Fair, MD;  [facility];  Service: Endoscopy;  Laterality: N/A;  . HIP ARTHROPLASTY Left 12/17/2012   Procedure: LEFT HIP HEMI ARTHROPLASTY ;  Surgeon: Rozanna Box, MD;  Location: [facility];  Service: Orthopedics;  Laterality: Left;  . LAPAROSCOPIC APPENDECTOMY N/A 10/05/2017   Procedure:  APPENDECTOMY LAPAROSCOPIC;  Surgeon: Greer Pickerel, MD;  [facility];  Service: General;  Laterality: N/A;  . left wrist surgery surgery  7 yrs ago  . LEG SURGERY Left   . LITHOTRIPSY    . MASTECTOMY W/ SENTINEL NODE BIOPSY Bilateral 01/20/2016   Procedure: BILATERAL TOTAL MASTECTOMY WITH RIGHT SENTINEL LYMPH NODE BIOPSY;  Surgeon: Rolm Bookbinder, MD;  Location: [facility];  Service: General;  Laterality: Bilateral;  . WRIST SURGERY     left wrist    Current Medications: Current Meds  Medication Sig  . acetaminophen (TYLENOL) 500 MG tablet Take 1 tablet (500 mg total) by mouth every 6 (six) hours as needed for mild pain or  headache.  Marland Kitchen amLODipine (NORVASC) 5 MG tablet Take 5 mg by mouth daily.  Marland Kitchen aspirin 81 MG chewable tablet Chew 1 tablet (81 mg total) by mouth daily.  . metoprolol succinate (TOPROL-XL) 25 MG 24 hr tablet Take 25 mg by mouth daily.  . nitroGLYCERIN (NITROSTAT) 0.4 MG SL tablet Place 1 tablet under the tongue as needed.     Allergies:   Vicodin [hydrocodone-acetaminophen] and Ambien [zolpidem tartrate]   Social History   Socioeconomic History  . Marital status: Widowed    Spouse name: Not on file  . Number of children: Not on file  . Years of education: Not on file  . Highest education level: Not on file  Occupational History  . Not on file  Social Needs  . Financial resource strain: Not on file  . Food insecurity:    Worry: Not on file    Inability: Not on file  . Transportation needs:    Medical: Not on file    Non-medical: Not on file  Tobacco Use  . Smoking status: Never Smoker  . Smokeless tobacco: Current User    Types: Chew  Substance and Sexual Activity  . Alcohol use: Not Currently    Comment: daily -drinks a quart of beer  . Drug use: No  . Sexual activity: Not on file  Lifestyle  . Physical activity:    Days per week: Not on file    Minutes per session: Not on file  . Stress: Not on file  Relationships  . Social connections:    Talks on phone: Not on file    Gets together: Not on file    Attends religious service: Not on file    Active member of club or organization: Not on file    Attends meetings of clubs or organizations: Not on file    Relationship status: Not on file  Other Topics Concern  . Not on file  Social History Narrative  . Not on file     Family History: The patient's family history includes CAD in her father; Hypertension in her father; Stroke in her mother. ROS:   Please see the history of present illness.    All 14 point review of systems negative except as described per history of present illness.  EKGs/Labs/Other Studies  Reviewed:    The following studies were reviewed today:   EKG:  EKG is  ordered today.  The ekg ordered today demonstrates G showed normal sinus rhythm normal PR interval right bundle branch block no ST segment changes  Recent Labs: 10/05/2017: ALT 13 10/08/2017: Hemoglobin 11.9; Platelets 200 10/09/2017: B Natriuretic Peptide 299.7; BUN 6; Creatinine, Ser 0.68; Potassium 3.9; Sodium 138  Recent Lipid Panel    Component Value Date/Time   CHOL 180 06/05/2014 0314   TRIG  275 (H) 06/05/2014 0314   HDL 41 06/05/2014 0314   CHOLHDL 4.4 06/05/2014 0314   VLDL 55 (H) 06/05/2014 0314   LDLCALC 84 06/05/2014 0314    Physical Exam:    VS:  BP 124/60   Pulse 63   Ht 5\' 10"  (1.778 m)   Wt 127 lb 6.4 oz (57.8 kg)   SpO2 98%   BMI 18.28 kg/m     Wt Readings from Last 3 Encounters:  11/19/17 127 lb 6.4 oz (57.8 kg)  10/07/17 128 lb (58.1 kg)  04/10/17 123 lb 7.3 oz (56 kg)     GEN:  Well nourished, well developed in no acute distress HEENT: Normal NECK: No JVD; No carotid bruits LYMPHATICS: No lymphadenopathy CARDIAC: RRR, no murmurs, no rubs, no gallops RESPIRATORY:  Clear to auscultation without rales, wheezing or rhonchi  ABDOMEN: Soft, non-tender, non-distended MUSCULOSKELETAL:  No edema; No deformity  SKIN: Warm and dry NEUROLOGIC:  Alert and oriented x 3 PSYCHIATRIC:  Normal affect   ASSESSMENT:    1. PAF (paroxysmal atrial fibrillation) (HCC)   2. Palpitations   3. Esophageal stricture   4. Alcohol abuse   5. Atypical chest pain    PLAN:    In order of problems listed above:  1. Palpitations with history of paroxysmal atrial fibrillation.  I think the key will be to determine exactly what kind of arrhythmia she has.  She does have history of drinking therefore atrial fibrillation that she suffered from previously could be related to it.  I will ask her to wear monitor long-term Holter for about 3-4 days.  She did have echocardiogram done while in [facility] which  was just a few months ago I did review that the echocardiogram ejection fraction was normal there were no critical valvular pathology. 2. He is to assess esophageal stricture: Apparently stable that could be responsible for her pain. 3. History of alcohol abuse: Apparently she is not drinking anymore. 4. Atypical chest pain.  I wanted to stress test on her she is reluctant she is telling me that she got stress test while she was in [place] long however I do not see that test.  Interestingly she recalls to have some test when she was given some medication make her feel weird and she had 2 sets of pictures taken suspect she described Lexiscan however I do not see any documentation of this recently in our record.  Regardless will wait for results of her event recorder/Holter monitor based on that we will decide about stress test.  He is planning to go to a different state for a while.  We will try to put a monitor on her.  And will decide about future approach based on results of the test.  She was given metoprolol long-acting and she said it seems to be helping.  She may require more in the future but again the key will be to determine what kind of arrhythmia with dealing with.  I will also check her fasting lipid profile when she comes to pick up her monitor.   Medication Adjustments/Labs and Tests Ordered: Current medicines are reviewed at length with the patient today.  Concerns regarding medicines are outlined above.  No orders of the defined types were placed in this encounter.  No orders of the defined types were placed in this encounter.   Signed, Park Liter, MD, [facility]. 11/19/2017 2:00 PM    [place]

## 2017-11-21 ENCOUNTER — Ambulatory Visit: Payer: Medicare HMO

## 2017-11-21 DIAGNOSIS — F1010 Alcohol abuse, uncomplicated: Secondary | ICD-10-CM

## 2017-11-21 DIAGNOSIS — E785 Hyperlipidemia, unspecified: Secondary | ICD-10-CM | POA: Diagnosis not present

## 2017-11-21 DIAGNOSIS — R002 Palpitations: Secondary | ICD-10-CM | POA: Diagnosis not present

## 2017-11-22 ENCOUNTER — Telehealth: Payer: Self-pay | Admitting: *Deleted

## 2017-11-22 ENCOUNTER — Other Ambulatory Visit: Payer: Self-pay | Admitting: Cardiology

## 2017-11-22 DIAGNOSIS — F1010 Alcohol abuse, uncomplicated: Secondary | ICD-10-CM

## 2017-11-22 LAB — LIPID PANEL
Chol/HDL Ratio: 4.3 ratio (ref 0.0–4.4)
Cholesterol, Total: 226 mg/dL — ABNORMAL HIGH (ref 100–199)
HDL: 53 mg/dL (ref >39)
LDL Calculated: 112 mg/dL — ABNORMAL HIGH (ref 0–99)
Triglycerides: 306 mg/dL — ABNORMAL HIGH (ref 0–149)
VLDL Cholesterol Cal: 61 mg/dL — ABNORMAL HIGH (ref 5–40)

## 2017-11-22 NOTE — Telephone Encounter (Signed)
-----   Message from Park Liter, MD sent at 11/22/2017  9:04 AM EDT ----- Chol elevated, should be on med but with hx of ETOH lets check LFT before starting meds

## 2017-11-22 NOTE — Telephone Encounter (Signed)
Patient's son, Chevis Pretty, informed of results. Advised that patient needed follow up lab work and that the patient could come back into our office without an appointment to get that done. [name] verbalized understanding and stated that he would let his mom know. Advised them to call if they had any questions.

## 2017-11-26 ENCOUNTER — Encounter: Payer: Self-pay | Admitting: *Deleted

## 2017-11-26 DIAGNOSIS — F1010 Alcohol abuse, uncomplicated: Secondary | ICD-10-CM | POA: Diagnosis not present

## 2017-11-27 LAB — HEPATIC FUNCTION PANEL
ALT: 9 IU/L (ref 0–32)
AST: 12 IU/L (ref 0–40)
Albumin: 4.5 g/dL (ref 3.5–4.8)
Alkaline Phosphatase: 82 IU/L (ref 39–117)
BILIRUBIN TOTAL: 0.4 mg/dL (ref 0.0–1.2)
BILIRUBIN, DIRECT: 0.13 mg/dL (ref 0.00–0.40)
TOTAL PROTEIN: 6.8 g/dL (ref 6.0–8.5)

## 2017-12-05 ENCOUNTER — Telehealth: Payer: Self-pay | Admitting: Cardiology

## 2017-12-05 NOTE — Telephone Encounter (Signed)
[name] is calling for results of long term monitor.

## 2017-12-05 NOTE — Telephone Encounter (Signed)
Patient's daughter-in-law, Macky Lower, informed of results and asked to reschedule one month follow up appointment due to patient being out of town. Follow up appointment rescheduled for 01/28/18. No further questions.

## 2017-12-19 ENCOUNTER — Ambulatory Visit: Payer: Medicare HMO | Admitting: Cardiology

## 2018-01-28 ENCOUNTER — Ambulatory Visit: Payer: Medicare HMO | Admitting: Cardiology

## 2018-01-31 ENCOUNTER — Ambulatory Visit: Payer: Medicare HMO | Admitting: Cardiology

## 2018-05-14 DIAGNOSIS — E538 Deficiency of other specified B group vitamins: Secondary | ICD-10-CM | POA: Diagnosis not present

## 2018-05-14 DIAGNOSIS — E782 Mixed hyperlipidemia: Secondary | ICD-10-CM | POA: Diagnosis not present

## 2018-05-14 DIAGNOSIS — K2970 Gastritis, unspecified, without bleeding: Secondary | ICD-10-CM | POA: Diagnosis not present

## 2018-05-14 DIAGNOSIS — I10 Essential (primary) hypertension: Secondary | ICD-10-CM | POA: Diagnosis not present

## 2018-05-14 DIAGNOSIS — R309 Painful micturition, unspecified: Secondary | ICD-10-CM | POA: Diagnosis not present

## 2018-05-14 DIAGNOSIS — E559 Vitamin D deficiency, unspecified: Secondary | ICD-10-CM | POA: Diagnosis not present

## 2018-05-14 DIAGNOSIS — Z23 Encounter for immunization: Secondary | ICD-10-CM | POA: Diagnosis not present

## 2018-05-14 DIAGNOSIS — Z853 Personal history of malignant neoplasm of breast: Secondary | ICD-10-CM | POA: Diagnosis not present

## 2018-07-02 DIAGNOSIS — Z79899 Other long term (current) drug therapy: Secondary | ICD-10-CM | POA: Diagnosis not present

## 2018-07-02 DIAGNOSIS — J40 Bronchitis, not specified as acute or chronic: Secondary | ICD-10-CM | POA: Diagnosis not present

## 2018-07-02 DIAGNOSIS — R0602 Shortness of breath: Secondary | ICD-10-CM | POA: Diagnosis not present

## 2018-07-02 DIAGNOSIS — R05 Cough: Secondary | ICD-10-CM | POA: Diagnosis not present

## 2018-07-02 DIAGNOSIS — J189 Pneumonia, unspecified organism: Secondary | ICD-10-CM | POA: Diagnosis not present

## 2018-07-10 DIAGNOSIS — C50919 Malignant neoplasm of unspecified site of unspecified female breast: Secondary | ICD-10-CM | POA: Diagnosis not present

## 2018-07-10 DIAGNOSIS — I10 Essential (primary) hypertension: Secondary | ICD-10-CM | POA: Diagnosis not present

## 2018-07-10 DIAGNOSIS — E782 Mixed hyperlipidemia: Secondary | ICD-10-CM | POA: Diagnosis not present

## 2018-07-10 DIAGNOSIS — Z853 Personal history of malignant neoplasm of breast: Secondary | ICD-10-CM | POA: Diagnosis not present

## 2018-07-16 DIAGNOSIS — E538 Deficiency of other specified B group vitamins: Secondary | ICD-10-CM | POA: Diagnosis not present

## 2018-07-16 DIAGNOSIS — K219 Gastro-esophageal reflux disease without esophagitis: Secondary | ICD-10-CM | POA: Diagnosis not present

## 2018-07-16 DIAGNOSIS — I10 Essential (primary) hypertension: Secondary | ICD-10-CM | POA: Diagnosis not present

## 2018-07-16 DIAGNOSIS — Z8701 Personal history of pneumonia (recurrent): Secondary | ICD-10-CM | POA: Diagnosis not present

## 2018-07-16 DIAGNOSIS — K222 Esophageal obstruction: Secondary | ICD-10-CM | POA: Diagnosis not present

## 2018-07-16 DIAGNOSIS — Z853 Personal history of malignant neoplasm of breast: Secondary | ICD-10-CM | POA: Diagnosis not present

## 2018-07-18 ENCOUNTER — Encounter (HOSPITAL_COMMUNITY): Payer: Self-pay | Admitting: Emergency Medicine

## 2018-07-18 ENCOUNTER — Other Ambulatory Visit: Payer: Self-pay

## 2018-07-18 ENCOUNTER — Emergency Department (HOSPITAL_COMMUNITY): Payer: Medicare HMO

## 2018-07-18 ENCOUNTER — Emergency Department (HOSPITAL_COMMUNITY)
Admission: EM | Admit: 2018-07-18 | Discharge: 2018-07-18 | Disposition: A | Discharge status: Home / Self Care | Payer: Medicare HMO | Attending: Emergency Medicine | Admitting: Emergency Medicine

## 2018-07-18 DIAGNOSIS — I4891 Unspecified atrial fibrillation: Secondary | ICD-10-CM | POA: Insufficient documentation

## 2018-07-18 DIAGNOSIS — Z853 Personal history of malignant neoplasm of breast: Secondary | ICD-10-CM | POA: Insufficient documentation

## 2018-07-18 DIAGNOSIS — Z79899 Other long term (current) drug therapy: Secondary | ICD-10-CM | POA: Insufficient documentation

## 2018-07-18 DIAGNOSIS — R0600 Dyspnea, unspecified: Secondary | ICD-10-CM

## 2018-07-18 DIAGNOSIS — F17290 Nicotine dependence, other tobacco product, uncomplicated: Secondary | ICD-10-CM | POA: Insufficient documentation

## 2018-07-18 DIAGNOSIS — I10 Essential (primary) hypertension: Secondary | ICD-10-CM | POA: Insufficient documentation

## 2018-07-18 DIAGNOSIS — R079 Chest pain, unspecified: Secondary | ICD-10-CM | POA: Diagnosis not present

## 2018-07-18 DIAGNOSIS — R0602 Shortness of breath: Secondary | ICD-10-CM | POA: Diagnosis not present

## 2018-07-18 DIAGNOSIS — R0789 Other chest pain: Secondary | ICD-10-CM | POA: Diagnosis not present

## 2018-07-18 LAB — I-STAT TROPONIN, ED
TROPONIN I, POC: 0 ng/mL (ref 0.00–0.08)
Troponin i, poc: 0 ng/mL (ref 0.00–0.08)

## 2018-07-18 LAB — COMPREHENSIVE METABOLIC PANEL
ALT: 13 U/L (ref 0–44)
ANION GAP: 14 (ref 5–15)
AST: 19 U/L (ref 15–41)
Albumin: 3.8 g/dL (ref 3.5–5.0)
Alkaline Phosphatase: 98 U/L (ref 38–126)
BUN: 5 mg/dL — ABNORMAL LOW (ref 8–23)
CHLORIDE: 100 mmol/L (ref 98–111)
CO2: 23 mmol/L (ref 22–32)
Calcium: 9.5 mg/dL (ref 8.9–10.3)
Creatinine, Ser: 0.78 mg/dL (ref 0.44–1.00)
GFR calc Af Amer: >60 mL/min (ref >60)
GFR calc non Af Amer: >60 mL/min (ref >60)
Glucose, Bld: 156 mg/dL — ABNORMAL HIGH (ref 70–99)
Potassium: 4 mmol/L (ref 3.5–5.1)
Sodium: 137 mmol/L (ref 135–145)
Total Bilirubin: 0.7 mg/dL (ref 0.3–1.2)
Total Protein: 7.4 g/dL (ref 6.5–8.1)

## 2018-07-18 LAB — CBC WITH DIFFERENTIAL/PLATELET
Abs Immature Granulocytes: 0.02 10*3/uL (ref 0.00–0.07)
BASOS ABS: 0.1 10*3/uL (ref 0.0–0.1)
Basophils Relative: 1 %
Eosinophils Absolute: 0.2 10*3/uL (ref 0.0–0.5)
Eosinophils Relative: 3 %
HEMATOCRIT: 54.1 % — AB (ref 36.0–46.0)
Hemoglobin: 16.9 g/dL — ABNORMAL HIGH (ref 12.0–15.0)
IMMATURE GRANULOCYTES: 0 %
LYMPHS ABS: 2.2 10*3/uL (ref 0.7–4.0)
LYMPHS PCT: 27 %
MCH: 29.4 pg (ref 26.0–34.0)
MCHC: 31.2 g/dL (ref 30.0–36.0)
MCV: 94.1 fL (ref 80.0–100.0)
Monocytes Absolute: 0.5 10*3/uL (ref 0.1–1.0)
Monocytes Relative: 6 %
NEUTROS PCT: 63 %
NRBC: 0 % (ref 0.0–0.2)
Neutro Abs: 5.2 10*3/uL (ref 1.7–7.7)
Platelets: 333 10*3/uL (ref 150–400)
RBC: 5.75 MIL/uL — ABNORMAL HIGH (ref 3.87–5.11)
RDW: 12.4 % (ref 11.5–15.5)
WBC: 8.2 10*3/uL (ref 4.0–10.5)

## 2018-07-18 LAB — BRAIN NATRIURETIC PEPTIDE: B NATRIURETIC PEPTIDE 5: 28.2 pg/mL (ref 0.0–100.0)

## 2018-07-18 MED ORDER — METHYLPREDNISOLONE SODIUM SUCC 125 MG IJ SOLR
125.0000 mg | Freq: Once | INTRAMUSCULAR | Status: AC
  Administered 2018-07-18: 125 mg via INTRAVENOUS
  Filled 2018-07-18: qty 2

## 2018-07-18 MED ORDER — ONDANSETRON HCL 4 MG/2ML IJ SOLN
4.0000 mg | Freq: Once | INTRAMUSCULAR | Status: AC
  Administered 2018-07-18: 4 mg via INTRAVENOUS
  Filled 2018-07-18: qty 2

## 2018-07-18 MED ORDER — IPRATROPIUM-ALBUTEROL 0.5-2.5 (3) MG/3ML IN SOLN
3.0000 mL | Freq: Once | RESPIRATORY_TRACT | Status: AC
  Administered 2018-07-18: 3 mL via RESPIRATORY_TRACT
  Filled 2018-07-18: qty 3

## 2018-07-18 MED ORDER — IOPAMIDOL (ISOVUE-370) INJECTION 76%
100.0000 mL | Freq: Once | INTRAVENOUS | Status: AC | PRN
  Administered 2018-07-18: 75 mL via INTRAVENOUS

## 2018-07-18 MED ORDER — IOPAMIDOL (ISOVUE-370) INJECTION 76%
INTRAVENOUS | Status: AC
  Filled 2018-07-18: qty 100

## 2018-07-18 NOTE — ED Triage Notes (Addendum)
Pt in from home with c/o cp and sob. States earlier this am, she had severe heartburn w/sob. Took NTG x 3 at home, felt some relief. Sats 98% on RA, able to speak in short sentences. Treated for PNA 2 wks ago

## 2018-07-18 NOTE — ED Notes (Signed)
Patient speaking with son to come pick her up from ED.

## 2018-07-18 NOTE — ED Provider Notes (Signed)
[facility] EMERGENCY DEPARTMENT Provider Note   CSN: [id number] Arrival date & time: 07/18/18  1036     History   Chief Complaint Chief Complaint  Patient presents with  . Chest Pain  . Shortness of Breath    HPI [name] is a 77 y.o. female.  Patient is a 77 year old female with past medical history of prior CVA, breast cancer, hypertension, and paroxysmal A. fib.  She presents today for evaluation of shortness of breath.  This started yesterday evening and is worsening.  She reports an episode of heartburn yesterday evening that seemed to improve with a nitroglycerin.  She denies any fevers, chills, or productive cough.  The history is provided by the patient.  Shortness of Breath  This is a new problem. The average episode lasts 12 hours. The problem occurs continuously.The problem has been rapidly worsening. Associated symptoms include chest pain. Pertinent negatives include no fever, no cough, no sputum production, no leg pain and no leg swelling. It is unknown what precipitated the problem. Associated medical issues do not include COPD or heart failure.    Past Medical History:  Diagnosis Date  . Alcohol abuse   . Anemia   . Arthritis   . Bacterial vaginosis   . Cancer ([place]) 01/2015   Breast cancer right   . Cerebrovascular accident (stroke) ([place]) 2/07   left frontal lobe  . Closed left hip fracture ([place]) 5/14  . Complication of anesthesia    woke up too early  with kidney stone surgery and wrist surgery  . Depression   . Dupuytren's contracture   . Fibrocystic breast disease   . Fracture of metatarsal bone of right foot    5th metatarsal  . GERD (gastroesophageal reflux disease)   . Gout   . Headache   . Hyperlipidemia LDL goal <70   . Hypertension   . Paroxysmal A-fib ([place])   . Renal calculus   . Vitamin B12 deficiency   . Vitamin D deficiency     Patient Active Problem List   Diagnosis Date Noted  . Palpitations 11/19/2017  .  Atypical chest pain 11/19/2017  . Pleuritic chest pain 10/09/2017  . Hypoxia 10/09/2017  . Appendicitis with perforation   . Acute appendicitis 10/06/2017  . Fall 04/10/2017  . Acute lower UTI 04/10/2017  . Accelerated hypertension 04/10/2017  . Malignant neoplasm of upper-outer quadrant of right breast in female, estrogen receptor positive ([place]) 01/07/2016  . Stroke ([place]) 06/05/2014  . Alcohol dependence ([place]) 06/05/2014  . Dyslipidemia 06/05/2014  . Syncope 06/04/2014  . Postoperative anemia due to acute blood loss 12/19/2012  . Osteoporosis 12/19/2012  . Osteoporosis with fracture 12/19/2012  . Hip fracture, left ([place]) 12/16/2012  . Gout 12/16/2012  . fracture of fifth right metatarsal bone 12/16/2012  . Alcohol abuse 12/16/2012  . GERD (gastroesophageal reflux disease) 12/16/2012  . PAF (paroxysmal atrial fibrillation) ([place]) 12/16/2012  . B12 deficiency 12/16/2012  . Vitamin D deficiency 12/16/2012  . Esophageal stricture 12/16/2012  . Other and unspecified hyperlipidemia 12/16/2012  . Reactive depression (situational) 12/16/2012  . Insomnia 12/16/2012  . H/O: CVA (cerebrovascular accident) 12/16/2012  . Dupuytren's contracture of both hands 12/16/2012  . Fibrocystic breast disease 12/16/2012    Past Surgical History:  Procedure Laterality Date  . ABDOMINAL HYSTERECTOMY    . cataracts Bilateral   . ESOPHAGEAL MANOMETRY N/A 08/15/2017   Procedure: ESOPHAGEAL MANOMETRY (EM);  Surgeon: Ronnette Juniper, MD;  [facility];  Service: Gastroenterology;  Laterality: N/A;  . ESOPHAGOGASTRODUODENOSCOPY (EGD) WITH PROPOFOL N/A 05/02/2016   Procedure: ESOPHAGOGASTRODUODENOSCOPY (EGD) WITH PROPOFOL;  Surgeon: Garlan Fair, MD;  [facility];  Service: Endoscopy;  Laterality: N/A;  . HIP ARTHROPLASTY Left 12/17/2012   Procedure: LEFT HIP HEMI ARTHROPLASTY ;  Surgeon: Rozanna Box, MD;  Location: [facility];  Service: Orthopedics;  Laterality: Left;  . LAPAROSCOPIC  APPENDECTOMY N/A 10/05/2017   Procedure: APPENDECTOMY LAPAROSCOPIC;  Surgeon: Greer Pickerel, MD;  [facility];  Service: General;  Laterality: N/A;  . left wrist surgery surgery  7 yrs ago  . LEG SURGERY Left   . LITHOTRIPSY    . MASTECTOMY W/ SENTINEL NODE BIOPSY Bilateral 01/20/2016   Procedure: BILATERAL TOTAL MASTECTOMY WITH RIGHT SENTINEL LYMPH NODE BIOPSY;  Surgeon: Rolm Bookbinder, MD;  Location: [facility];  Service: General;  Laterality: Bilateral;  . WRIST SURGERY     left wrist     OB History   None      Home Medications    Prior to Admission medications   Medication Sig Start Date End Date Taking? Authorizing Provider  acetaminophen (TYLENOL) 500 MG tablet Take 1 tablet (500 mg total) by mouth every 6 (six) hours as needed for mild pain or headache. 10/11/17   Rayburn, Claiborne Billings A, PA-C  amLODipine (NORVASC) 5 MG tablet Take 5 mg by mouth daily.    [provider]  aspirin 81 MG chewable tablet Chew 1 tablet (81 mg total) by mouth daily. 10/11/17   Rayburn, Floyce Stakes, PA-C  metoprolol succinate (TOPROL-XL) 25 MG 24 hr tablet Take 25 mg by mouth daily.    [provider]  nitroGLYCERIN (NITROSTAT) 0.4 MG SL tablet Place 1 tablet under the tongue as needed. 11/13/17   [provider]    Family History Family History  Problem Relation Age of Onset  . Stroke Mother   . Hypertension Father   . CAD Father     Social History Social History   Tobacco Use  . Smoking status: Never Smoker  . Smokeless tobacco: Current User    Types: Chew  Substance Use Topics  . Alcohol use: Yes    Comment: daily -drinks a quart of beer  . Drug use: No     Allergies   Vicodin [hydrocodone-acetaminophen] and Ambien [zolpidem tartrate]   Review of Systems Review of Systems  Constitutional: Negative for fever.  Respiratory: Positive for shortness of breath. Negative for cough and sputum production.   Cardiovascular: Positive for chest pain. Negative for leg  swelling.  All other systems reviewed and are negative.    Physical Exam Updated Vital Signs Wt 54.4 kg   BMI 17.22 kg/m   Physical Exam  Constitutional: She is oriented to person, place, and time. She appears well-developed and well-nourished. No distress.  HENT:  Head: Normocephalic and atraumatic.  Neck: Normal range of motion. Neck supple.  Cardiovascular: Normal rate and regular rhythm. Exam reveals no gallop and no friction rub.  No murmur heard. Pulmonary/Chest: Effort normal. No respiratory distress. She has no wheezes. She has rhonchi in the right middle field and the left middle field.  There are slight expiratory rhonchi bilaterally.  Abdominal: Soft. Bowel sounds are normal. She exhibits no distension. There is no tenderness.  Musculoskeletal: Normal range of motion.       Right lower leg: Normal. She exhibits no tenderness and no edema.       Left lower leg: Normal. She exhibits no tenderness and no edema.  Neurological: She is alert and oriented to person, place, and time.  Skin: Skin is warm and dry. She is not diaphoretic.  Nursing note and vitals reviewed.    ED Treatments / Results  Labs (all labs ordered are listed, but only abnormal results are displayed) Labs Reviewed - No data to display  EKG EKG Interpretation  Date/Time:  Thursday July 18 2018 10:43:52 EST Ventricular Rate:  98 PR Interval:    QRS Duration: 124 QT Interval:  378 QTC Calculation: 483 R Axis:   82 Text Interpretation:  Sinus rhythm Right bundle branch block Anteroseptal infarct, age indeterminate Baseline wander in lead(s) V5 Confirmed by Veryl Speak [phone]) on 07/18/2018 10:50:52 AM   Radiology No results found.  Procedures Procedures (including critical care time)  Medications Ordered in ED Medications  ipratropium-albuterol (DUONEB) 0.5-2.5 (3) MG/3ML nebulizer solution 3 mL (has no administration in time range)     Initial Impression / Assessment and Plan / ED  Course  I have reviewed the triage vital signs and the nursing notes.  Pertinent labs & imaging results that were available during my care of the patient were reviewed by me and considered in my medical decision making (see chart for details).  Patient presenting with complaints of chest pain and shortness of breath.  She has a history of COPD.  There is no hypoxia and her chest x-ray shows no obvious abnormality.  Patient will undergo a CT to rule out pulmonary embolism and repeat troponin.  Care will be signed out to Dr. Kathrynn Humble at shift change who will obtain the results of the studies and determine the final disposition.  Final Clinical Impressions(s) / ED Diagnoses   Final diagnoses:  None    ED Discharge Orders    None       Veryl Speak, MD 07/19/18 [phone]

## 2018-07-18 NOTE — Discharge Instructions (Addendum)
All the results in the ER are normal, labs and imaging. We are not sure what is causing your symptoms. The workup in the ER is not complete, and is limited to screening for life threatening and emergent conditions only, so please see a primary care doctor for further evaluation.  

## 2018-07-18 NOTE — ED Notes (Signed)
Patient verbalized understanding of discharge instructions and denies any further needs or questions at this time. VS stable. Patient ambulatory with steady gait. Assisted to ED entrance in wheelchair where she's waiting for her son to pick her up.

## 2018-07-18 NOTE — ED Provider Notes (Signed)
  Physical Exam  BP 136/78   Pulse 94   Resp 18   Wt 54.4 kg   SpO2 97%   BMI 17.22 kg/m   Physical Exam  ED Course/Procedures     Procedures  MDM  Assuming care of patient from Dr. Stark Jock.   Patient in the ED for dib and some chest discomfort. Workup thus far shows no acute findings.  Concerning findings are as following : none. Important pending results are CT angio and repeat trop.  According to Dr. Stark Jock, plan is to f/u on pending results. If results are neg and pt is feeling well, she should be able to be discharged. Pt has received copd tx.   Patient had no complains, no concerns from the nursing side. Will continue to monitor.   EKG Interpretation  Date/Time:  Thursday July 18 2018 10:43:52 EST Ventricular Rate:  98 PR Interval:    QRS Duration: 124 QT Interval:  378 QTC Calculation: 483 R Axis:   82 Text Interpretation:  Sinus rhythm Right bundle branch block Anteroseptal infarct, age indeterminate Baseline wander in lead(s) V5 Confirmed by Veryl Speak [phone]) on 07/18/2018 10:50:52 AM            Varney Biles, MD 07/18/18 1605

## 2018-07-29 DIAGNOSIS — C50919 Malignant neoplasm of unspecified site of unspecified female breast: Secondary | ICD-10-CM | POA: Diagnosis not present

## 2018-07-29 DIAGNOSIS — Z853 Personal history of malignant neoplasm of breast: Secondary | ICD-10-CM | POA: Diagnosis not present

## 2018-07-29 DIAGNOSIS — E782 Mixed hyperlipidemia: Secondary | ICD-10-CM | POA: Diagnosis not present

## 2018-07-29 DIAGNOSIS — I10 Essential (primary) hypertension: Secondary | ICD-10-CM | POA: Diagnosis not present

## 2018-10-23 ENCOUNTER — Emergency Department (HOSPITAL_COMMUNITY)
Admission: EM | Admit: 2018-10-23 | Discharge: 2018-10-23 | Disposition: A | Discharge status: Home / Self Care | Payer: Medicare Other | Attending: Emergency Medicine | Admitting: Emergency Medicine

## 2018-10-23 ENCOUNTER — Other Ambulatory Visit: Payer: Self-pay

## 2018-10-23 ENCOUNTER — Encounter (HOSPITAL_COMMUNITY): Payer: Self-pay

## 2018-10-23 ENCOUNTER — Emergency Department (HOSPITAL_COMMUNITY): Payer: Medicare Other

## 2018-10-23 DIAGNOSIS — Y999 Unspecified external cause status: Secondary | ICD-10-CM | POA: Insufficient documentation

## 2018-10-23 DIAGNOSIS — Z853 Personal history of malignant neoplasm of breast: Secondary | ICD-10-CM | POA: Diagnosis not present

## 2018-10-23 DIAGNOSIS — Y9389 Activity, other specified: Secondary | ICD-10-CM | POA: Diagnosis not present

## 2018-10-23 DIAGNOSIS — Z8673 Personal history of transient ischemic attack (TIA), and cerebral infarction without residual deficits: Secondary | ICD-10-CM | POA: Diagnosis not present

## 2018-10-23 DIAGNOSIS — W19XXXA Unspecified fall, initial encounter: Secondary | ICD-10-CM | POA: Insufficient documentation

## 2018-10-23 DIAGNOSIS — I10 Essential (primary) hypertension: Secondary | ICD-10-CM | POA: Insufficient documentation

## 2018-10-23 DIAGNOSIS — F17220 Nicotine dependence, chewing tobacco, uncomplicated: Secondary | ICD-10-CM | POA: Insufficient documentation

## 2018-10-23 DIAGNOSIS — S52531A Colles' fracture of right radius, initial encounter for closed fracture: Secondary | ICD-10-CM | POA: Diagnosis not present

## 2018-10-23 DIAGNOSIS — S6991XA Unspecified injury of right wrist, hand and finger(s), initial encounter: Secondary | ICD-10-CM | POA: Diagnosis present

## 2018-10-23 DIAGNOSIS — Y92000 Kitchen of unspecified non-institutional (private) residence as  the place of occurrence of the external cause: Secondary | ICD-10-CM | POA: Insufficient documentation

## 2018-10-23 DIAGNOSIS — Z7982 Long term (current) use of aspirin: Secondary | ICD-10-CM | POA: Insufficient documentation

## 2018-10-23 LAB — URINALYSIS, ROUTINE W REFLEX MICROSCOPIC
Bilirubin Urine: NEGATIVE
Glucose, UA: NEGATIVE mg/dL
Ketones, ur: NEGATIVE mg/dL
Leukocytes,Ua: NEGATIVE
Nitrite: NEGATIVE
Protein, ur: NEGATIVE mg/dL
Specific Gravity, Urine: 1.011 (ref 1.005–1.030)
pH: 5 (ref 5.0–8.0)

## 2018-10-23 LAB — CBC WITH DIFFERENTIAL/PLATELET
Abs Immature Granulocytes: 0.03 10*3/uL (ref 0.00–0.07)
Basophils Absolute: 0.1 10*3/uL (ref 0.0–0.1)
Basophils Relative: 1 %
Eosinophils Absolute: 0.2 10*3/uL (ref 0.0–0.5)
Eosinophils Relative: 2 %
HCT: 46.2 % — ABNORMAL HIGH (ref 36.0–46.0)
Hemoglobin: 15 g/dL (ref 12.0–15.0)
Immature Granulocytes: 0 %
Lymphocytes Relative: 22 %
Lymphs Abs: 2.1 10*3/uL (ref 0.7–4.0)
MCH: 31.4 pg (ref 26.0–34.0)
MCHC: 32.5 g/dL (ref 30.0–36.0)
MCV: 96.7 fL (ref 80.0–100.0)
MONO ABS: 0.6 10*3/uL (ref 0.1–1.0)
Monocytes Relative: 6 %
Neutro Abs: 6.8 10*3/uL (ref 1.7–7.7)
Neutrophils Relative %: 69 %
PLATELETS: 238 10*3/uL (ref 150–400)
RBC: 4.78 MIL/uL (ref 3.87–5.11)
RDW: 12.6 % (ref 11.5–15.5)
WBC: 9.8 10*3/uL (ref 4.0–10.5)
nRBC: 0 % (ref 0.0–0.2)

## 2018-10-23 LAB — BASIC METABOLIC PANEL
Anion gap: 10 (ref 5–15)
BUN: 11 mg/dL (ref 8–23)
CO2: 26 mmol/L (ref 22–32)
Calcium: 9 mg/dL (ref 8.9–10.3)
Chloride: 101 mmol/L (ref 98–111)
Creatinine, Ser: 0.73 mg/dL (ref 0.44–1.00)
GFR calc Af Amer: >60 mL/min (ref >60)
GFR calc non Af Amer: >60 mL/min (ref >60)
Glucose, Bld: 111 mg/dL — ABNORMAL HIGH (ref 70–99)
Potassium: 3.9 mmol/L (ref 3.5–5.1)
Sodium: 137 mmol/L (ref 135–145)

## 2018-10-23 MED ORDER — OXYCODONE-ACETAMINOPHEN 5-325 MG PO TABS
1.0000 | ORAL_TABLET | Freq: Once | ORAL | Status: AC
  Administered 2018-10-23: 1 via ORAL
  Filled 2018-10-23: qty 1

## 2018-10-23 MED ORDER — OXYCODONE-ACETAMINOPHEN 5-325 MG PO TABS: 1.0000 | ORAL_TABLET | Freq: Four times a day (QID) | ORAL | 0 refills | Status: DC | PRN

## 2018-10-23 NOTE — ED Provider Notes (Signed)
[facility] DEPT Provider Note   CSN: [id number] Arrival date & time: 10/23/18  1443    History   Chief Complaint Chief Complaint  Patient presents with  . Wrist Pain    right  . Fall    HPI [name] is a 78 y.o. female.  She is presenting here today after a fall yesterday.  She said she was in the kitchen and felt lightheaded and fell to the ground.  She thinks she might of passed out for a while.  She is complaining of moderate throbbing pain in her right wrist that is worsened with any kind of movement.  No numbness or tingling.  No recent illness no chest pain shortness of breath abdominal pain vomiting or diarrhea.  No urinary symptoms.  No fevers or chills.      Wrist Pain  This is a new problem. The current episode started yesterday. The problem occurs constantly. The problem has not changed since onset.Pertinent negatives include no chest pain, no abdominal pain, no headaches and no shortness of breath. The symptoms are aggravated by bending and twisting. The symptoms are relieved by position. She has tried nothing for the symptoms. The treatment provided no relief.  Fall  This is a new problem. The current episode started yesterday. The problem has been resolved. Pertinent negatives include no chest pain, no abdominal pain, no headaches and no shortness of breath. Nothing aggravates the symptoms. Nothing relieves the symptoms. She has tried nothing for the symptoms. The treatment provided no relief.    Past Medical History:  Diagnosis Date  . Alcohol abuse   . Anemia   . Arthritis   . Bacterial vaginosis   . Cancer ([place]) 01/2015   Breast cancer right   . Cerebrovascular accident (stroke) ([place]) 2/07   left frontal lobe  . Closed left hip fracture ([place]) 5/14  . Complication of anesthesia    woke up too early  with kidney stone surgery and wrist surgery  . Depression   . Dupuytren's contracture   . Fibrocystic breast disease   .  Fracture of metatarsal bone of right foot    5th metatarsal  . GERD (gastroesophageal reflux disease)   . Gout   . Headache   . Hyperlipidemia LDL goal <70   . Hypertension   . Paroxysmal A-fib ([place])   . Renal calculus   . Vitamin B12 deficiency   . Vitamin D deficiency     Patient Active Problem List   Diagnosis Date Noted  . Palpitations 11/19/2017  . Atypical chest pain 11/19/2017  . Pleuritic chest pain 10/09/2017  . Hypoxia 10/09/2017  . Appendicitis with perforation   . Acute appendicitis 10/06/2017  . Fall 04/10/2017  . Acute lower UTI 04/10/2017  . Accelerated hypertension 04/10/2017  . Malignant neoplasm of upper-outer quadrant of right breast in female, estrogen receptor positive ([place]) 01/07/2016  . Stroke ([place]) 06/05/2014  . Alcohol dependence ([place]) 06/05/2014  . Dyslipidemia 06/05/2014  . Syncope 06/04/2014  . Postoperative anemia due to acute blood loss 12/19/2012  . Osteoporosis 12/19/2012  . Osteoporosis with fracture 12/19/2012  . Hip fracture, left ([place]) 12/16/2012  . Gout 12/16/2012  . fracture of fifth right metatarsal bone 12/16/2012  . Alcohol abuse 12/16/2012  . GERD (gastroesophageal reflux disease) 12/16/2012  . PAF (paroxysmal atrial fibrillation) ([place]) 12/16/2012  . B12 deficiency 12/16/2012  . Vitamin D deficiency 12/16/2012  . Esophageal stricture 12/16/2012  . Other and unspecified hyperlipidemia 12/16/2012  .  Reactive depression (situational) 12/16/2012  . Insomnia 12/16/2012  . H/O: CVA (cerebrovascular accident) 12/16/2012  . Dupuytren's contracture of both hands 12/16/2012  . Fibrocystic breast disease 12/16/2012    Past Surgical History:  Procedure Laterality Date  . ABDOMINAL HYSTERECTOMY    . cataracts Bilateral   . ESOPHAGEAL MANOMETRY N/A 08/15/2017   Procedure: ESOPHAGEAL MANOMETRY (EM);  Surgeon: Ronnette Juniper, MD;  [facility];  Service: Gastroenterology;  Laterality: N/A;  . ESOPHAGOGASTRODUODENOSCOPY (EGD) WITH  PROPOFOL N/A 05/02/2016   Procedure: ESOPHAGOGASTRODUODENOSCOPY (EGD) WITH PROPOFOL;  Surgeon: Garlan Fair, MD;  [facility];  Service: Endoscopy;  Laterality: N/A;  . HIP ARTHROPLASTY Left 12/17/2012   Procedure: LEFT HIP HEMI ARTHROPLASTY ;  Surgeon: Rozanna Box, MD;  Location: [facility];  Service: Orthopedics;  Laterality: Left;  . LAPAROSCOPIC APPENDECTOMY N/A 10/05/2017   Procedure: APPENDECTOMY LAPAROSCOPIC;  Surgeon: Greer Pickerel, MD;  [facility]: [facility];  Service: General;  Laterality: N/A;  . left wrist surgery surgery  7 yrs ago  . LEG SURGERY Left   . LITHOTRIPSY    . MASTECTOMY W/ SENTINEL NODE BIOPSY Bilateral 01/20/2016   Procedure: BILATERAL TOTAL MASTECTOMY WITH RIGHT SENTINEL LYMPH NODE BIOPSY;  Surgeon: Rolm Bookbinder, MD;  Location: [facility];  Service: General;  Laterality: Bilateral;  . WRIST SURGERY     left wrist     OB History   No obstetric history on file.      Home Medications    Prior to Admission medications   Medication Sig Start Date End Date Taking? Authorizing Provider  acetaminophen (TYLENOL) 500 MG tablet Take 1 tablet (500 mg total) by mouth every 6 (six) hours as needed for mild pain or headache. 10/11/17   Rayburn, Claiborne Billings A, PA-C  amLODipine (NORVASC) 5 MG tablet Take 5 mg by mouth daily.    [provider]  Ascorbic Acid (VITAMIN C) 100 MG tablet Take 100 mg by mouth daily.    [provider]  aspirin 81 MG chewable tablet Chew 1 tablet (81 mg total) by mouth daily. Patient taking differently: Chew 81 mg by mouth 2 (two) times a week.  10/11/17   Rayburn, Floyce Stakes, PA-C  cholecalciferol (VITAMIN D3) 25 MCG (1000 UT) tablet Take 1,000 Units by mouth daily.    [provider]  metoprolol succinate (TOPROL-XL) 25 MG 24 hr tablet Take 25 mg by mouth daily.    [provider]  nitroGLYCERIN (NITROSTAT) 0.4 MG SL tablet Place 1 tablet under the tongue as needed for chest pain.  11/13/17   [provider]   vitamin B-12 (CYANOCOBALAMIN) 100 MCG tablet Take 100 mcg by mouth daily.    [provider]    Family History Family History  Problem Relation Age of Onset  . Stroke Mother   . Hypertension Father   . CAD Father     Social History Social History   Tobacco Use  . Smoking status: Never Smoker  . Smokeless tobacco: Current User    Types: Chew  Substance Use Topics  . Alcohol use: Yes    Comment: daily -drinks a quart of beer  . Drug use: No     Allergies   Vicodin [hydrocodone-acetaminophen] and Ambien [zolpidem tartrate]   Review of Systems Review of Systems  Constitutional: Negative for fever.  HENT: Negative for sore throat.   Eyes: Negative for visual disturbance.  Respiratory: Negative for shortness of breath.   Cardiovascular: Negative for chest pain.  Gastrointestinal: Negative for  abdominal pain.  Genitourinary: Negative for dysuria.  Musculoskeletal: Negative for neck pain.  Skin: Negative for rash.  Neurological: Positive for syncope. Negative for headaches.     Physical Exam Updated Vital Signs BP (!) 141/73 (BP Location: Left Arm)   Pulse 80   Temp 98.9 F (37.2 C) (Oral)   Resp 13   Ht 5\' 10"  (1.778 m)   Wt 54 kg   SpO2 97%   BMI 17.07 kg/m   Physical Exam Vitals signs and nursing note reviewed.  Constitutional:      General: She is not in acute distress.    Appearance: She is well-developed.  HENT:     Head: Normocephalic and atraumatic.  Eyes:     Conjunctiva/sclera: Conjunctivae normal.  Neck:     Musculoskeletal: Neck supple.  Cardiovascular:     Rate and Rhythm: Normal rate and regular rhythm.     Heart sounds: No murmur.  Pulmonary:     Effort: Pulmonary effort is normal. No respiratory distress.     Breath sounds: Normal breath sounds.  Abdominal:     Palpations: Abdomen is soft.     Tenderness: There is no abdominal tenderness.  Musculoskeletal:        General: Swelling, tenderness and signs of injury present.      Right lower leg: No edema.     Left lower leg: No edema.     Comments: She is got moderate swelling of her right wrist and unable to range of motion secondary to pain.  Distal neurovascular intact.  Nontender shoulder elbow along with left upper extremity and bilateral lower extremities.  Radial pulse 2+ cap refill brisk.  Skin:    General: Skin is warm and dry.     Capillary Refill: Capillary refill takes less than 2 seconds.  Neurological:     General: No focal deficit present.     Mental Status: She is alert.     Sensory: No sensory deficit.     Motor: No weakness.      ED Treatments / Results  Labs (all labs ordered are listed, but only abnormal results are displayed) Labs Reviewed  BASIC METABOLIC PANEL - Abnormal; Notable for the following components:      Result Value   Glucose, Bld 111 (*)    All other components within normal limits  CBC WITH DIFFERENTIAL/PLATELET - Abnormal; Notable for the following components:   HCT 46.2 (*)    All other components within normal limits  URINALYSIS, ROUTINE W REFLEX MICROSCOPIC - Abnormal; Notable for the following components:   APPearance CLOUDY (*)    Hgb urine dipstick SMALL (*)    Bacteria, UA MANY (*)    All other components within normal limits    EKG EKG Interpretation  Date/Time:  Wednesday October 23 2018 10:11:11 EDT Ventricular Rate:  71 PR Interval:    QRS Duration: 88 QT Interval:  426 QTC Calculation: 463 R Axis:   86 Text Interpretation:  Sinus rhythm Borderline right axis deviation Minimal ST elevation, inferior leads RBBB on prior improved, c/w 12/19 Confirmed by Aletta Edouard [phone]) on 10/23/2018 10:30:28 AM   Radiology Dg Forearm Right  Result Date: 10/23/2018 CLINICAL DATA:  [place].  Right forearm pain. EXAM: RIGHT FOREARM - 2 VIEW COMPARISON:  None. FINDINGS: Collies fracture noted at the wrist. The elbow joint is maintained. Mild degenerative changes. No forearm fractures. IMPRESSION: Distal radius  fracture/Colles fracture. No forearm fractures. Electronically Signed   By: Ricky Stabs.D.  On: 10/23/2018 11:23   Dg Wrist Complete Right  Result Date: 10/23/2018 CLINICAL DATA:  [place] at home.  Injured right wrist. EXAM: RIGHT WRIST - COMPLETE 3+ VIEW COMPARISON:  None. FINDINGS: There is a dorsally impacted distal radius fracture (Colles type fracture). No obvious intra-articular involvement. Small density near the ulnar styloid could be TFCC calcification. I do not see an ulnar styloid avulsion fracture. The carpal bones are intact and the visualized metacarpal bones are intact. IMPRESSION: Dorsally impacted distal radius fracture (Colles fracture). No obvious intra-articular involvement. Electronically Signed   By: Marijo Sanes M.D.   On: 10/23/2018 11:23    Procedures Procedures (including critical care time)  Medications Ordered in ED Medications  oxyCODONE-acetaminophen (PERCOCET/ROXICET) 5-325 MG per tablet 1 tablet (1 tablet Oral Given 10/23/18 1303)     Initial Impression / Assessment and Plan / ED Course  I have reviewed the triage vital signs and the nursing notes.  Pertinent labs & imaging results that were available during my care of the patient were reviewed by me and considered in my medical decision making (see chart for details).  Clinical Course as of Oct 22 1800  Wed Oct 23, 2018  1105 Patient has a distal radius fracture.  I placed a call into the hand consult for recommendations.  I have updated the patient.  She said she lives alone.  Does not use a cane or walker to get around.   [MB]  3953 Discussed with Silvestre Gunner, PA for hand consult.  He is recommending the patient be splinted with a volar short arm and follow-up with Dr. Caralyn Guile within 1 week.   [MB]    Clinical Course User Index [MB] Hayden Rasmussen, MD        Final Clinical Impressions(s) / ED Diagnoses   Final diagnoses:  Closed Colles' fracture of right radius, initial encounter     ED Discharge Orders         Ordered    oxyCODONE-acetaminophen (PERCOCET/ROXICET) 5-325 MG tablet  Every 6 hours PRN     10/23/18 1303           Hayden Rasmussen, MD 10/23/18 1802

## 2018-10-23 NOTE — ED Triage Notes (Signed)
Pt states she got dizzy yesterday and fell, injuring right wrist. Wrist is noted to having swelling .

## 2018-10-23 NOTE — Discharge Instructions (Addendum)
You were seen in the emergency department for a fall in which she injured her right wrist.  Your x-ray showed that you have a fracture of your distal radius.  You were placed in a splint and will need to keep this on clean and dry.  You will need to follow-up with a hand specialist Dr. Caralyn Guile.  Please call his office to schedule an appointment either later this week or the beginning of next week.  Tylenol as needed for pain.  Percocet for breakthrough pain.  Return if any concerns.

## 2018-10-23 NOTE — ED Notes (Signed)
Pt @ x-ray

## 2018-10-28 ENCOUNTER — Ambulatory Visit (INDEPENDENT_AMBULATORY_CARE_PROVIDER_SITE_OTHER): Payer: Medicare Other | Admitting: Cardiology

## 2018-10-28 ENCOUNTER — Encounter: Payer: Self-pay | Admitting: Cardiology

## 2018-10-28 ENCOUNTER — Other Ambulatory Visit: Payer: Self-pay

## 2018-10-28 VITALS — BP 124/70 | HR 66 | Ht 70.0 in | Wt 124.0 lb

## 2018-10-28 DIAGNOSIS — I480 Paroxysmal atrial fibrillation: Secondary | ICD-10-CM | POA: Diagnosis not present

## 2018-10-28 DIAGNOSIS — W19XXXD Unspecified fall, subsequent encounter: Secondary | ICD-10-CM | POA: Diagnosis not present

## 2018-10-28 DIAGNOSIS — R002 Palpitations: Secondary | ICD-10-CM | POA: Diagnosis not present

## 2018-10-28 DIAGNOSIS — R55 Syncope and collapse: Secondary | ICD-10-CM

## 2018-10-28 DIAGNOSIS — F1010 Alcohol abuse, uncomplicated: Secondary | ICD-10-CM

## 2018-10-28 NOTE — Progress Notes (Signed)
Cardiology Office Note:    Date:  10/28/2018   ID:  [name], [name] Nov 17, 1940, MRN [id number]  PCP:  Josetta Huddle, MD  Cardiologist:  Jenne Campus, MD    Referring MD: Josetta Huddle, MD   Chief Complaint  Patient presents with  . Follow-up  I fell down  History of Present Illness:    [name] is a 78 y.o. female with a quite complex past medical history.  She is a poor historian she tells me that last Friday she was trying to bend to help her granddaughter and then she lost her balance fell down she is not sure if she passed out did not eventually end up sustaining fracture on the right wrist.  She is scheduled to have surgery on Wednesday she described to have some shortness of breath while walking.  She actually end up going to [facility] few weeks ago for that reason she was in the emergency room and was discharged home and then she tells me that she went to [facility] with the same complaint couple weeks later but have no documentation of it.  She said she walks she gets short of breath there is no swelling of lower extremities there is no chest pain tightness squeezing pressure burning chest.  She was able to walk from her car to our office using elevator with not much difficulties.  Used to drink alcohol however now she said she drinks only milk  Past Medical History:  Diagnosis Date  . Alcohol abuse   . Anemia   . Arthritis   . Bacterial vaginosis   . Cancer ([place]) 01/2015   Breast cancer right   . Cerebrovascular accident (stroke) ([place]) 2/07   left frontal lobe  . Closed left hip fracture ([place]) 5/14  . Complication of anesthesia    woke up too early  with kidney stone surgery and wrist surgery  . Depression   . Dupuytren's contracture   . Fibrocystic breast disease   . Fracture of metatarsal bone of right foot    5th metatarsal  . GERD (gastroesophageal reflux disease)   . Gout   . Headache   . Hyperlipidemia LDL goal <70   . Hypertension   . Paroxysmal  A-fib ([place])   . Renal calculus   . Vitamin B12 deficiency   . Vitamin D deficiency     Past Surgical History:  Procedure Laterality Date  . ABDOMINAL HYSTERECTOMY    . cataracts Bilateral   . ESOPHAGEAL MANOMETRY N/A 08/15/2017   Procedure: ESOPHAGEAL MANOMETRY (EM);  Surgeon: Ronnette Juniper, MD;  [facility];  Service: Gastroenterology;  Laterality: N/A;  . ESOPHAGOGASTRODUODENOSCOPY (EGD) WITH PROPOFOL N/A 05/02/2016   Procedure: ESOPHAGOGASTRODUODENOSCOPY (EGD) WITH PROPOFOL;  Surgeon: Garlan Fair, MD;  [facility];  Service: Endoscopy;  Laterality: N/A;  . HIP ARTHROPLASTY Left 12/17/2012   Procedure: LEFT HIP HEMI ARTHROPLASTY ;  Surgeon: Rozanna Box, MD;  Location: [facility];  Service: Orthopedics;  Laterality: Left;  . LAPAROSCOPIC APPENDECTOMY N/A 10/05/2017   Procedure: APPENDECTOMY LAPAROSCOPIC;  Surgeon: Greer Pickerel, MD;  [facility];  Service: General;  Laterality: N/A;  . left wrist surgery surgery  7 yrs ago  . LEG SURGERY Left   . LITHOTRIPSY    . MASTECTOMY W/ SENTINEL NODE BIOPSY Bilateral 01/20/2016   Procedure: BILATERAL TOTAL MASTECTOMY WITH RIGHT SENTINEL LYMPH NODE BIOPSY;  Surgeon: Rolm Bookbinder, MD;  Location: [facility];  Service: General;  Laterality: Bilateral;  .  WRIST SURGERY     left wrist    Current Medications: Current Meds  Medication Sig  . albuterol (VENTOLIN HFA) 108 (90 Base) MCG/ACT inhaler Inhale 1-2 puffs into the lungs every 6 (six) hours as needed for wheezing or shortness of breath.  Marland Kitchen amLODipine (NORVASC) 5 MG tablet Take 5 mg by mouth daily.  . cholecalciferol (VITAMIN D3) 25 MCG (1000 UT) tablet Take 1,000 Units by mouth daily.  . Cyanocobalamin (VITAMIN B-12 IJ) Inject 1 application as directed every 30 (thirty) days.  . metoprolol succinate (TOPROL-XL) 25 MG 24 hr tablet Take 25 mg by mouth daily.  . nitroGLYCERIN (NITROSTAT) 0.4 MG SL tablet Place 1 tablet under the tongue as needed for chest pain.   Marland Kitchen  omeprazole (PRILOSEC) 20 MG capsule Take 1 capsule by mouth 2 (two) times daily.  Marland Kitchen oxyCODONE-acetaminophen (PERCOCET/ROXICET) 5-325 MG tablet Take 1 tablet by mouth every 6 (six) hours as needed for severe pain.  . vitamin B-12 (CYANOCOBALAMIN) 100 MCG tablet Take 100 mcg by mouth daily.     Allergies:   Vicodin [hydrocodone-acetaminophen] and Ambien [zolpidem tartrate]   Social History   Socioeconomic History  . Marital status: Widowed    Spouse name: Not on file  . Number of children: Not on file  . Years of education: Not on file  . Highest education level: Not on file  Occupational History  . Not on file  Social Needs  . Financial resource strain: Not on file  . Food insecurity:    Worry: Not on file    Inability: Not on file  . Transportation needs:    Medical: Not on file    Non-medical: Not on file  Tobacco Use  . Smoking status: Never Smoker  . Smokeless tobacco: Current User    Types: Chew  Substance and Sexual Activity  . Alcohol use: Yes    Comment: daily -drinks a quart of beer  . Drug use: No  . Sexual activity: Not on file  Lifestyle  . Physical activity:    Days per week: Not on file    Minutes per session: Not on file  . Stress: Not on file  Relationships  . Social connections:    Talks on phone: Not on file    Gets together: Not on file    Attends religious service: Not on file    Active member of club or organization: Not on file    Attends meetings of clubs or organizations: Not on file    Relationship status: Not on file  Other Topics Concern  . Not on file  Social History Narrative  . Not on file     Family History: The patient's family history includes CAD in her father; Hypertension in her father; Stroke in her mother. ROS:   Please see the history of present illness.    All 14 point review of systems negative except as described per history of present illness  EKGs/Labs/Other Studies Reviewed:      Recent Labs: 07/18/2018: ALT  13; B Natriuretic Peptide 28.2 10/23/2018: BUN 11; Creatinine, Ser 0.73; Hemoglobin 15.0; Platelets 238; Potassium 3.9; Sodium 137  Recent Lipid Panel    Component Value Date/Time   CHOL 226 (H) 11/21/2017 0850   TRIG 306 (H) 11/21/2017 0850   HDL 53 11/21/2017 0850   CHOLHDL 4.3 11/21/2017 0850   CHOLHDL 4.4 06/05/2014 0314   VLDL 55 (H) 06/05/2014 0314   LDLCALC 112 (H) 11/21/2017 0850    Physical Exam:  VS:  BP 124/70   Pulse 66   Ht 5\' 10"  (1.778 m)   Wt 124 lb (56.2 kg)   SpO2 95%   BMI 17.79 kg/m     Wt Readings from Last 3 Encounters:  10/28/18 124 lb (56.2 kg)  10/23/18 119 lb (54 kg)  07/18/18 120 lb (54.4 kg)     GEN:  Well nourished, well developed in no acute distress HEENT: Normal NECK: No JVD; No carotid bruits LYMPHATICS: No lymphadenopathy CARDIAC: RRR, no murmurs, no rubs, no gallops RESPIRATORY:  Clear to auscultation without rales, wheezing or rhonchi  ABDOMEN: Soft, non-tender, non-distended MUSCULOSKELETAL:  No edema; No deformity  SKIN: Warm and dry LOWER EXTREMITIES: no swelling NEUROLOGIC:  Alert and oriented x 3 PSYCHIATRIC:  Normal affect   ASSESSMENT:    1. PAF (paroxysmal atrial fibrillation) ([place])   2. Syncope, unspecified syncope type   3. Palpitations   4. Fall, subsequent encounter   5. Alcohol abuse    PLAN:    In order of problems listed above:  1. Paroxysmal atrial fibrillation.  Do EKG today to check the rhythm she denies having any palpitations she is not anticoagulated because of frequent falls and also alcohol drinking.  We will ask her to have echocardiogram to look at left ventricular ejection fraction as well as left atrial size 2. Syncope I am not even sure if she truly passed out.  She is a poor historian obviously this is something that is concerning.  She does have a recurrent episode of passing out.  It could be related to alcohol drinking however she tells me she does not do it anymore. 3. Palpitations denies  having any.  Overall is a very complex and difficult situation she sustained a fall that resulted in fracture of the right wrist.  She is here my office to be evaluated before the surgery but because she is such a poor historian it is extremely difficult task.  I think we need to get at least echocardiogram to assess later left ventricular ejection fraction.  Luckily her exercise tolerance is quite decent she was able to walk from the parking lot to our waiting room downstairs.  If her echocardiogram is normal then I think we can proceed with surgery as scheduled we will try to get appointment for her to have echocardiogram done tomorrow.  I do not think she would be candidate for anticoagulation because of frequent falls   Medication Adjustments/Labs and Tests Ordered: Current medicines are reviewed at length with the patient today.  Concerns regarding medicines are outlined above.  No orders of the defined types were placed in this encounter.  Medication changes: No orders of the defined types were placed in this encounter.   Signed, Park Liter, MD, [facility] 10/28/2018 3:38 PM    [place]

## 2018-10-28 NOTE — Patient Instructions (Signed)
Medication Instructions:  Your physician recommends that you continue on your current medications as directed. Please refer to the Current Medication list given to you today.  If you need a refill on your cardiac medications before your next appointment, please call your pharmacy.   Lab work:  None  If you have labs (blood work) drawn today and your tests are completely normal, you will receive your results only by: Marland Kitchen MyChart Message (if you have MyChart) OR . A paper copy in the mail If you have any lab test that is abnormal or we need to change your treatment, we will call you to review the results.  Testing/Procedures:  Echocardiogram  Your physician has requested that you have an echocardiogram. Echocardiography is a painless test that uses sound waves to create images of your heart. It provides your doctor with information about the size and shape of your heart and how well your heart's chambers and valves are working. This procedure takes approximately one hour. There are no restrictions for this procedure.  Echocardiogram An echocardiogram is a procedure that uses painless sound waves (ultrasound) to produce an image of the heart. Images from an echocardiogram can provide important information about: Signs of coronary artery disease (CAD). Aneurysm detection. An aneurysm is a weak or damaged part of an artery wall that bulges out from the normal force of blood pumping through the body. Heart size and shape. Changes in the size or shape of the heart can be associated with certain conditions, including heart failure, aneurysm, and CAD. Heart muscle function. Heart valve function. Signs of a past heart attack. Fluid buildup around the heart. Thickening of the heart muscle. A tumor or infectious growth around the heart valves. Tell a health care provider about: Any allergies you have. All medicines you are taking, including vitamins, herbs, eye drops, creams, and over-the-counter  medicines. Any blood disorders you have. Any surgeries you have had. Any medical conditions you have. Whether you are pregnant or may be pregnant. What are the risks? Generally, this is a safe procedure. However, problems may occur, including: Allergic reaction to dye (contrast) that may be used during the procedure. What happens before the procedure? No specific preparation is needed. You may eat and drink normally. What happens during the procedure?  An IV tube may be inserted into one of your veins. You may receive contrast through this tube. A contrast is an injection that improves the quality of the pictures from your heart. A gel will be applied to your chest. A wand-like tool (transducer) will be moved over your chest. The gel will help to transmit the sound waves from the transducer. The sound waves will harmlessly bounce off of your heart to allow the heart images to be captured in real-time motion. The images will be recorded on a computer. The procedure may vary among health care providers and hospitals. What happens after the procedure? You may return to your normal, everyday life, including diet, activities, and medicines, unless your health care provider tells you not to do that. Summary An echocardiogram is a procedure that uses painless sound waves (ultrasound) to produce an image of the heart. Images from an echocardiogram can provide important information about the size and shape of your heart, heart muscle function, heart valve function, and fluid buildup around your heart. You do not need to do anything to prepare before this procedure. You may eat and drink normally. After the echocardiogram is completed, you may return to your normal, everyday  life, unless your health care provider tells you not to do that. This information is not intended to replace advice given to you by your health care provider. Make sure you discuss any questions you have with your health care  provider. Document Released: 07/28/2000 Document Revised: 09/02/2016 Document Reviewed: 09/02/2016 Elsevier Interactive Patient Education  2019 [employer].   Follow-Up: At [facility], you and your health needs are our priority.  As part of our continuing mission to provide you with exceptional heart care, we have created designated Provider Care Teams.  These Care Teams include your primary Cardiologist (physician) and Advanced Practice Providers (APPs -  Physician Assistants and Nurse Practitioners) who all work together to provide you with the care you need, when you need it.  . You will need a follow up appointment in 3 months.  Please call our office 2 months in advance to schedule.

## 2018-10-29 ENCOUNTER — Other Ambulatory Visit: Payer: Self-pay

## 2018-10-29 ENCOUNTER — Ambulatory Visit (HOSPITAL_COMMUNITY)
Admission: RE | Admit: 2018-10-29 | Discharge: 2018-10-29 | Disposition: A | Discharge status: Home / Self Care | Payer: Medicare Other | Source: Ambulatory Visit | Attending: Cardiology | Admitting: Cardiology

## 2018-10-29 DIAGNOSIS — R002 Palpitations: Secondary | ICD-10-CM

## 2018-10-29 DIAGNOSIS — Z8673 Personal history of transient ischemic attack (TIA), and cerebral infarction without residual deficits: Secondary | ICD-10-CM | POA: Insufficient documentation

## 2018-10-29 DIAGNOSIS — R9431 Abnormal electrocardiogram [ECG] [EKG]: Secondary | ICD-10-CM | POA: Insufficient documentation

## 2018-10-29 DIAGNOSIS — R55 Syncope and collapse: Secondary | ICD-10-CM

## 2018-10-29 DIAGNOSIS — I10 Essential (primary) hypertension: Secondary | ICD-10-CM | POA: Insufficient documentation

## 2018-10-29 DIAGNOSIS — E785 Hyperlipidemia, unspecified: Secondary | ICD-10-CM | POA: Insufficient documentation

## 2018-10-29 DIAGNOSIS — I480 Paroxysmal atrial fibrillation: Secondary | ICD-10-CM | POA: Diagnosis present

## 2018-10-29 DIAGNOSIS — K219 Gastro-esophageal reflux disease without esophagitis: Secondary | ICD-10-CM | POA: Insufficient documentation

## 2018-10-29 DIAGNOSIS — I4891 Unspecified atrial fibrillation: Secondary | ICD-10-CM | POA: Diagnosis not present

## 2018-10-29 NOTE — Progress Notes (Signed)
  Echocardiogram 2D Echocardiogram has been performed.  Darlina Sicilian M 10/29/2018, 3:43 PM

## 2018-10-31 ENCOUNTER — Encounter (HOSPITAL_COMMUNITY): Payer: Self-pay | Admitting: *Deleted

## 2018-10-31 ENCOUNTER — Other Ambulatory Visit: Payer: Self-pay

## 2018-10-31 NOTE — Progress Notes (Signed)
I spoke with Marlowe Alt, patient's daughter- in- law, who patient lives with.  Mrs Haig Prophet states that patient has not experience chest pain in a long time. Ms Bieker says cardiologist Dr.Krrasowski 3/16/ 2020, patient had an echo on 10/29/2018.  Patient and the person accompany patient denies any of the following: Cough Fever ( >100.4) Runny nose Sore Throat Difficulty breathing/shortness of breath Travel in past  2 weeks  Travel to  Been around anyone who has been tested or diagnosed with Corona Virus

## 2018-10-31 NOTE — H&P (Addendum)
Orthopaedic Trauma Service (OTS) Consult   Patient ID: [name] MRN: [id number] DOB/AGE: 11-08-1940 78 y.o.   HPI: [name] is an 78 y.o. right-hand-dominant female who is well-known to the orthopedic trauma service who sustained a closed right distal radius fracture on 10/23/2018.  Patient was seen and evaluated in the emergency department at [facility].  She was placed into a splint and referred for orthopedic follow-up.  Patient was seen and evaluated on 10/30/2018 at our office.  There was significant change in her overall alignment of her distal radius fracture and we recommended surgical intervention.  Well aware of patient's medical comorbidities.  It does appear that she had a echocardiogram that was performed on 10/29/2018 which looks okay.  Patient presents today for repair of her right distal radius fracture. Pt has baseline contracture of her 5 small finger from previous surgery   Past Medical History:  Diagnosis Date  . Alcohol abuse   . Anemia   . Arthritis   . Bacterial vaginosis   . Cancer ([place]) 01/2015   Breast cancer right   . Cerebrovascular accident (stroke) ([place]) 2/07   left frontal lobe  . Closed left hip fracture ([place]) 5/14  . Complication of anesthesia    woke up too early  with kidney stone surgery and wrist surgery  . Depression   . Dupuytren's contracture   . Fibrocystic breast disease   . Fracture of metatarsal bone of right foot    5th metatarsal  . GERD (gastroesophageal reflux disease)   . Gout   . Headache   . Hyperlipidemia LDL goal <70   . Hypertension   . Paroxysmal A-fib ([place])   . Renal calculus   . Vitamin B12 deficiency   . Vitamin D deficiency     Past Surgical History:  Procedure Laterality Date  . ABDOMINAL HYSTERECTOMY    . cataracts Bilateral   . ESOPHAGEAL MANOMETRY N/A 08/15/2017   Procedure: ESOPHAGEAL MANOMETRY (EM);  Surgeon: Ronnette Juniper, MD;  Location: [facility];  Service: Gastroenterology;   Laterality: N/A;  . ESOPHAGOGASTRODUODENOSCOPY (EGD) WITH PROPOFOL N/A 05/02/2016   Procedure: ESOPHAGOGASTRODUODENOSCOPY (EGD) WITH PROPOFOL;  Surgeon: Garlan Fair, MD;  Location: [facility];  Service: Endoscopy;  Laterality: N/A;  . HIP ARTHROPLASTY Left 12/17/2012   Procedure: LEFT HIP HEMI ARTHROPLASTY ;  Surgeon: Rozanna Box, MD;  Location: [facility];  Service: Orthopedics;  Laterality: Left;  . LAPAROSCOPIC APPENDECTOMY N/A 10/05/2017   Procedure: APPENDECTOMY LAPAROSCOPIC;  Surgeon: Greer Pickerel, MD;  Location: [facility];  Service: General;  Laterality: N/A;  . left wrist surgery surgery  7 yrs ago  . LEG SURGERY Left   . LITHOTRIPSY    . MASTECTOMY W/ SENTINEL NODE BIOPSY Bilateral 01/20/2016   Procedure: BILATERAL TOTAL MASTECTOMY WITH RIGHT SENTINEL LYMPH NODE BIOPSY;  Surgeon: Rolm Bookbinder, MD;  Location: [facility];  Service: General;  Laterality: Bilateral;  . WRIST SURGERY     left wrist    Family History  Problem Relation Age of Onset  . Stroke Mother   . Hypertension Father   . CAD Father     Social History:  reports that she has never smoked. Her smokeless tobacco use includes chew. She reports current alcohol use. She reports that she does not use drugs.  Allergies:  Allergies  Allergen Reactions  . Vicodin [Hydrocodone-Acetaminophen]     UNSPECIFIED REACTION   . Ambien [Zolpidem Tartrate] Other (See Comments)  Causes nightmares    Medications:  No outpatient medications have been marked as taking for the 11/01/18 encounter [facility] Encounter).     No results found for this or any previous visit (from the past 48 hour(s)).  No results found.  Review of Systems  Constitutional: Negative for chills and fever.  Respiratory: Negative for shortness of breath and wheezing.   Cardiovascular: Negative for chest pain and palpitations.  Gastrointestinal: Negative for nausea and vomiting.  Neurological: Negative for tingling and sensory change.   There were no  vitals taken for this visit. Physical Exam Constitutional:      General: She is not in acute distress. HENT:     Head: Atraumatic.     Mouth/Throat:     Mouth: Mucous membranes are moist.  Cardiovascular:     Rate and Rhythm: Normal rate and regular rhythm.  Pulmonary:     Effort: Pulmonary effort is normal. No respiratory distress.  Abdominal:     General: Bowel sounds are normal.     Palpations: Abdomen is soft.  Musculoskeletal:     Comments: Right Upper Extremity  Patient is in a volar splint Tender to palpation of her distal radius Fixed flexion contracture of her fifth small finger Elbow is nontender and shoulder is nontender Swelling is well controlled No pain with passive stretching Extremity is warm Brisk capillary refill Radial, ulnar, median nerve motor and sensory functions are grossly intact AIN and PIN motor functions are grossly intact Excellent elbow and shoulder range of motion   Neurological:     Mental Status: She is alert and oriented to person, place, and time.  Psychiatric:     Comments: Patient appears to be at her baseline      Assessment/Plan:  78 year old right-hand-dominant female s/p ground-level fall on 10/23/2018 with right distal radius fracture with further displacement  -Ground-level fall  -Unstable right distal radius fracture  Recommend surgical intervention for ORIF as this is her dominant side and shows evidence of unstable fracture as its alignment has shifted since her ED visit  Anticipate doing this as an outpatient procedure  She will be immobilized for 2 weeks and then transition to a removable brace for range of motion exercises  Low-energy fracture suggestive of osteoporosis patient is on vitamin D supplementation  Outpatient work-up and bone density scan   Long standing history of chronic alcohol use although she denies usage at this point   She does continue to chew tobacco    Patient does have risk factors for nonunion  and complications in the perioperative period  - Pain management:  Medications have already been E- prescribed and includes Percocet.  Would likely provide prescription for nausea  - ID:   Perioperative antibiotics  - Metabolic Bone Disease:  Patient with known vitamin D deficiency  History of previous femoral neck fracture as well as current right distal radius fracture continues to suggest osteoporosis.  Patient will need bone density scan as an outpatient and treatment accordingly   - Impediments to fracture healing:  Chewing tobacco  Osteoporosis  Vitamin D deficiency  History of frequent falls  Age  - Dispo:  OR for ORIF right distal radius  Discharge home with daughter-in-law and son after surgery     Jari Pigg, PA-C [phone] (C) 10/31/2018, 12:28 PM  [facility] [place] [phone] Jenetta [phone] (F)   I have seen and examined the patient. I agree with the findings above.  I  discussed with the patient the risks and benefits of surgery for right distal radius repair, including the possibility of infection, nerve injury, vessel injury, wound breakdown, arthritis, symptomatic hardware, DVT/ PE, loss of motion, malunion, nonunion, and need for further surgery among others.  She acknowledged these risks and wished to proceed.   Rozanna Box, MD 11/01/2018 8:01 AM

## 2018-10-31 NOTE — Anesthesia Preprocedure Evaluation (Addendum)
Anesthesia Evaluation  Patient identified by MRN, date of birth, ID band Patient awake    Reviewed: Allergy & Precautions, NPO status , Patient's Chart, lab work & pertinent test results  History of Anesthesia Complications (+) history of anesthetic complications  Airway Mallampati: II  TM Distance: >3 FB Neck ROM: Full    Dental no notable dental hx. (+) Edentulous Upper, Edentulous Lower   Pulmonary neg pulmonary ROS,    Pulmonary exam normal breath sounds clear to auscultation       Cardiovascular hypertension, Normal cardiovascular exam Rhythm:Regular Rate:Normal  10/24/2018 Ekg  SR R71 NSST Changes   Neuro/Psych  Headaches, PSYCHIATRIC DISORDERS Depression CVA    GI/Hepatic Neg liver ROS, GERD  ,  Endo/Other    Renal/GU K+ 3.9 Cr 0.73     Musculoskeletal   Abdominal   Peds  Hematology  (+) anemia , Hgb 15.0   Anesthesia Other Findings   Reproductive/Obstetrics                            Anesthesia Physical Anesthesia Plan  ASA: III  Anesthesia Plan: General   Post-op Pain Management:    Induction: Intravenous  PONV Risk Score and Plan: 3 and Treatment may vary due to age or medical condition, Ondansetron and Dexamethasone  Airway Management Planned: LMA  Additional Equipment:   Intra-op Plan:   Post-operative Plan:   Informed Consent: I have reviewed the patients History and Physical, chart, labs and discussed the procedure including the risks, benefits and alternatives for the proposed anesthesia with the patient or authorized representative who has indicated his/her understanding and acceptance.     Dental advisory given  Plan Discussed with:   Anesthesia Plan Comments: (GA w LMA w R Supraclavicular)       Anesthesia Quick Evaluation

## 2018-11-01 ENCOUNTER — Encounter (HOSPITAL_COMMUNITY): Payer: Self-pay | Admitting: *Deleted

## 2018-11-01 ENCOUNTER — Encounter (HOSPITAL_COMMUNITY)
Admission: RE | Disposition: A | Discharge status: Home / Self Care | Payer: Self-pay | Source: Home / Self Care | Attending: Orthopedic Surgery

## 2018-11-01 ENCOUNTER — Ambulatory Visit (HOSPITAL_COMMUNITY): Payer: Medicare Other | Admitting: Anesthesiology

## 2018-11-01 ENCOUNTER — Ambulatory Visit (HOSPITAL_COMMUNITY)
Admission: RE | Admit: 2018-11-01 | Discharge: 2018-11-01 | Disposition: A | Discharge status: Home / Self Care | Payer: Medicare Other | Attending: Orthopedic Surgery | Admitting: Orthopedic Surgery

## 2018-11-01 ENCOUNTER — Ambulatory Visit (HOSPITAL_COMMUNITY): Payer: Medicare Other

## 2018-11-01 DIAGNOSIS — I480 Paroxysmal atrial fibrillation: Secondary | ICD-10-CM | POA: Insufficient documentation

## 2018-11-01 DIAGNOSIS — I10 Essential (primary) hypertension: Secondary | ICD-10-CM | POA: Insufficient documentation

## 2018-11-01 DIAGNOSIS — Z8673 Personal history of transient ischemic attack (TIA), and cerebral infarction without residual deficits: Secondary | ICD-10-CM | POA: Insufficient documentation

## 2018-11-01 DIAGNOSIS — T148XXA Other injury of unspecified body region, initial encounter: Secondary | ICD-10-CM

## 2018-11-01 DIAGNOSIS — W19XXXA Unspecified fall, initial encounter: Secondary | ICD-10-CM | POA: Insufficient documentation

## 2018-11-01 DIAGNOSIS — E559 Vitamin D deficiency, unspecified: Secondary | ICD-10-CM | POA: Insufficient documentation

## 2018-11-01 DIAGNOSIS — Z853 Personal history of malignant neoplasm of breast: Secondary | ICD-10-CM | POA: Insufficient documentation

## 2018-11-01 DIAGNOSIS — S52501A Unspecified fracture of the lower end of right radius, initial encounter for closed fracture: Secondary | ICD-10-CM | POA: Insufficient documentation

## 2018-11-01 DIAGNOSIS — S52571A Other intraarticular fracture of lower end of right radius, initial encounter for closed fracture: Secondary | ICD-10-CM

## 2018-11-01 HISTORY — PX: OPEN REDUCTION INTERNAL FIXATION (ORIF) DISTAL RADIAL FRACTURE: SHX5989

## 2018-11-01 HISTORY — DX: Personal history of other medical treatment: Z92.89

## 2018-11-01 LAB — HEPATIC FUNCTION PANEL
ALT: 10 U/L (ref 0–44)
AST: 15 U/L (ref 15–41)
Albumin: 3.4 g/dL — ABNORMAL LOW (ref 3.5–5.0)
Alkaline Phosphatase: 78 U/L (ref 38–126)
Bilirubin, Direct: 0.1 mg/dL (ref 0.0–0.2)
Indirect Bilirubin: 0.4 mg/dL (ref 0.3–0.9)
Total Bilirubin: 0.5 mg/dL (ref 0.3–1.2)
Total Protein: 6.3 g/dL — ABNORMAL LOW (ref 6.5–8.1)

## 2018-11-01 SURGERY — OPEN REDUCTION INTERNAL FIXATION (ORIF) DISTAL RADIUS FRACTURE
Anesthesia: General | Site: Arm Lower | Laterality: Right

## 2018-11-01 MED ORDER — CEFAZOLIN SODIUM-DEXTROSE 2-4 GM/100ML-% IV SOLN
INTRAVENOUS | Status: AC
  Filled 2018-11-01: qty 100

## 2018-11-01 MED ORDER — DEXAMETHASONE SODIUM PHOSPHATE 10 MG/ML IJ SOLN
INTRAMUSCULAR | Status: DC | PRN
  Administered 2018-11-01: 4 mg via INTRAVENOUS

## 2018-11-01 MED ORDER — EPHEDRINE SULFATE 50 MG/ML IJ SOLN
INTRAMUSCULAR | Status: DC | PRN
  Administered 2018-11-01: 5 mg via INTRAVENOUS
  Administered 2018-11-01: 10 mg via INTRAVENOUS

## 2018-11-01 MED ORDER — ONDANSETRON HCL 4 MG/2ML IJ SOLN: 4.0000 mg | Freq: Once | INTRAMUSCULAR | Status: DC | PRN

## 2018-11-01 MED ORDER — PROPOFOL 10 MG/ML IV BOLUS
INTRAVENOUS | Status: AC
  Filled 2018-11-01: qty 20

## 2018-11-01 MED ORDER — PROPOFOL 10 MG/ML IV BOLUS
INTRAVENOUS | Status: DC | PRN
  Administered 2018-11-01: 50 mg via INTRAVENOUS
  Administered 2018-11-01: 110 mg via INTRAVENOUS

## 2018-11-01 MED ORDER — CEFAZOLIN SODIUM-DEXTROSE 2-4 GM/100ML-% IV SOLN
2.0000 g | INTRAVENOUS | Status: AC
  Administered 2018-11-01: 2 g via INTRAVENOUS

## 2018-11-01 MED ORDER — FENTANYL CITRATE (PF) 100 MCG/2ML IJ SOLN: 25.0000 ug | Freq: Once | INTRAMUSCULAR | Status: DC

## 2018-11-01 MED ORDER — EPHEDRINE 5 MG/ML INJ
INTRAVENOUS | Status: AC
  Filled 2018-11-01: qty 10

## 2018-11-01 MED ORDER — LIDOCAINE HCL (CARDIAC) PF 100 MG/5ML IV SOSY
PREFILLED_SYRINGE | INTRAVENOUS | Status: DC | PRN
  Administered 2018-11-01: 100 mg via INTRAVENOUS

## 2018-11-01 MED ORDER — 0.9 % SODIUM CHLORIDE (POUR BTL) OPTIME
TOPICAL | Status: DC | PRN
  Administered 2018-11-01: 1000 mL

## 2018-11-01 MED ORDER — FENTANYL CITRATE (PF) 250 MCG/5ML IJ SOLN
INTRAMUSCULAR | Status: AC
  Filled 2018-11-01: qty 5

## 2018-11-01 MED ORDER — ROPIVACAINE HCL 5 MG/ML IJ SOLN
INTRAMUSCULAR | Status: DC | PRN
  Administered 2018-11-01: 30 mL via PERINEURAL

## 2018-11-01 MED ORDER — ACETAMINOPHEN 500 MG PO TABS: 1000.0000 mg | ORAL_TABLET | Freq: Once | ORAL | Status: DC

## 2018-11-01 MED ORDER — CLONIDINE HCL (ANALGESIA) 100 MCG/ML EP SOLN
EPIDURAL | Status: DC | PRN
  Administered 2018-11-01: 100 ug

## 2018-11-01 MED ORDER — MIDAZOLAM HCL 2 MG/2ML IJ SOLN
INTRAMUSCULAR | Status: AC
  Filled 2018-11-01: qty 2

## 2018-11-01 MED ORDER — FENTANYL CITRATE (PF) 100 MCG/2ML IJ SOLN: 25.0000 ug | INTRAMUSCULAR | Status: DC | PRN

## 2018-11-01 MED ORDER — FENTANYL CITRATE (PF) 100 MCG/2ML IJ SOLN
INTRAMUSCULAR | Status: AC
  Administered 2018-11-01: 25 ug
  Filled 2018-11-01: qty 2

## 2018-11-01 MED ORDER — SODIUM CHLORIDE 0.9 % IV SOLN
INTRAVENOUS | Status: DC | PRN
  Administered 2018-11-01: 25 ug/min via INTRAVENOUS

## 2018-11-01 MED ORDER — CHLORHEXIDINE GLUCONATE 4 % EX LIQD: 60.0000 mL | Freq: Once | CUTANEOUS | Status: DC

## 2018-11-01 MED ORDER — ONDANSETRON HCL 4 MG/2ML IJ SOLN
INTRAMUSCULAR | Status: DC | PRN
  Administered 2018-11-01: 4 mg via INTRAVENOUS

## 2018-11-01 MED ORDER — LACTATED RINGERS IV SOLN
INTRAVENOUS | Status: DC
  Administered 2018-11-01: 07:00:00 via INTRAVENOUS

## 2018-11-01 MED ORDER — POVIDONE-IODINE 10 % EX SWAB: 2.0000 "application " | Freq: Once | CUTANEOUS | Status: DC

## 2018-11-01 MED ORDER — LIDOCAINE 2% (20 MG/ML) 5 ML SYRINGE
INTRAMUSCULAR | Status: AC
  Filled 2018-11-01: qty 5

## 2018-11-01 MED ORDER — ONDANSETRON HCL 4 MG/2ML IJ SOLN
INTRAMUSCULAR | Status: AC
  Filled 2018-11-01: qty 2

## 2018-11-01 MED ORDER — ACETAMINOPHEN 10 MG/ML IV SOLN: 1000.0000 mg | Freq: Once | INTRAVENOUS | Status: DC | PRN

## 2018-11-01 MED ORDER — PHENYLEPHRINE 40 MCG/ML (10ML) SYRINGE FOR IV PUSH (FOR BLOOD PRESSURE SUPPORT)
80.0000 ug | PREFILLED_SYRINGE | INTRAVENOUS | Status: DC | PRN
  Filled 2018-11-01: qty 10

## 2018-11-01 MED ORDER — ACETAMINOPHEN 500 MG PO TABS
ORAL_TABLET | ORAL | Status: AC
  Administered 2018-11-01: 1000 mg
  Filled 2018-11-01: qty 2

## 2018-11-01 MED ORDER — PHENYLEPHRINE 40 MCG/ML (10ML) SYRINGE FOR IV PUSH (FOR BLOOD PRESSURE SUPPORT)
PREFILLED_SYRINGE | INTRAVENOUS | Status: AC
  Filled 2018-11-01: qty 10

## 2018-11-01 MED ORDER — EPHEDRINE SULFATE 50 MG/ML IJ SOLN
10.0000 mg | INTRAMUSCULAR | Status: DC | PRN
  Filled 2018-11-01: qty 0.2

## 2018-11-01 SURGICAL SUPPLY — 60 items
BANDAGE ACE 4X5 VEL STRL LF (GAUZE/BANDAGES/DRESSINGS) ×3 IMPLANT
BIT DRILL 2.2 SS TIBIAL (BIT) ×2 IMPLANT
BLADE CLIPPER SURG (BLADE) ×3 IMPLANT
BNDG CMPR 9X4 STRL LF SNTH (GAUZE/BANDAGES/DRESSINGS) ×1
BNDG ESMARK 4X9 LF (GAUZE/BANDAGES/DRESSINGS) ×3 IMPLANT
BRUSH SCRUB SURG 4.25 DISP (MISCELLANEOUS) ×6 IMPLANT
COVER SURGICAL LIGHT HANDLE (MISCELLANEOUS) ×3 IMPLANT
COVER WAND RF STERILE (DRAPES) ×3 IMPLANT
DECANTER SPIKE VIAL GLASS SM (MISCELLANEOUS) IMPLANT
DRAPE C-ARM 42X72 X-RAY (DRAPES) ×3 IMPLANT
DRIVER BIT SQUARE 1.7/2.2 (TRAUMA) ×2 IMPLANT
DRSG EMULSION OIL 3X3 NADH (GAUZE/BANDAGES/DRESSINGS) ×3 IMPLANT
ELECT REM PT RETURN 9FT ADLT (ELECTROSURGICAL) ×3
ELECTRODE REM PT RTRN 9FT ADLT (ELECTROSURGICAL) ×1 IMPLANT
GAUZE SPONGE 4X4 12PLY STRL (GAUZE/BANDAGES/DRESSINGS) ×3 IMPLANT
GAUZE SPONGE 4X4 12PLY STRL LF (GAUZE/BANDAGES/DRESSINGS) ×2 IMPLANT
GLOVE BIO SURGEON STRL SZ7.5 (GLOVE) ×3 IMPLANT
GLOVE BIO SURGEON STRL SZ8 (GLOVE) ×3 IMPLANT
GLOVE BIOGEL PI IND STRL 7.5 (GLOVE) ×1 IMPLANT
GLOVE BIOGEL PI IND STRL 8 (GLOVE) ×1 IMPLANT
GLOVE BIOGEL PI INDICATOR 7.5 (GLOVE) ×2
GLOVE BIOGEL PI INDICATOR 8 (GLOVE) ×2
GOWN STRL REUS W/ TWL LRG LVL3 (GOWN DISPOSABLE) ×2 IMPLANT
GOWN STRL REUS W/ TWL XL LVL3 (GOWN DISPOSABLE) ×1 IMPLANT
GOWN STRL REUS W/TWL LRG LVL3 (GOWN DISPOSABLE) ×6
GOWN STRL REUS W/TWL XL LVL3 (GOWN DISPOSABLE) ×3
K-WIRE 1.6 (WIRE) ×6
K-WIRE FX5X1.6XNS BN SS (WIRE) ×2
KIT BASIN OR (CUSTOM PROCEDURE TRAY) ×3 IMPLANT
KIT TURNOVER KIT B (KITS) ×3 IMPLANT
KWIRE FX5X1.6XNS BN SS (WIRE) IMPLANT
NDL HYPO 25GX1X1/2 BEV (NEEDLE) IMPLANT
NEEDLE HYPO 25GX1X1/2 BEV (NEEDLE) IMPLANT
NS IRRIG 1000ML POUR BTL (IV SOLUTION) ×3 IMPLANT
PACK ORTHO EXTREMITY (CUSTOM PROCEDURE TRAY) ×3 IMPLANT
PAD ABD 8X10 STRL (GAUZE/BANDAGES/DRESSINGS) ×1 IMPLANT
PAD ARMBOARD 7.5X6 YLW CONV (MISCELLANEOUS) ×6 IMPLANT
PAD CAST 3X4 CTTN HI CHSV (CAST SUPPLIES) ×1 IMPLANT
PAD CAST 4YDX4 CTTN HI CHSV (CAST SUPPLIES) IMPLANT
PADDING CAST COTTON 3X4 STRL (CAST SUPPLIES) ×3
PADDING CAST COTTON 4X4 STRL (CAST SUPPLIES) ×3
PEG LOCKING SMOOTH 2.2X20 (Screw) ×2 IMPLANT
PEG LOCKING SMOOTH 2.2X22 (Screw) ×4 IMPLANT
PEG LOCKING SMOOTH 2.2X24 (Peg) ×6 IMPLANT
PLATE NARROW DVR RIGHT (Plate) ×2 IMPLANT
SCREW LOCK 14X2.7X 3 LD TPR (Screw) IMPLANT
SCREW LOCK 22X2.7X 3 LD TPR (Screw) IMPLANT
SCREW LOCKING 2.7X14 (Screw) ×9 IMPLANT
SCREW LOCKING 2.7X22MM (Screw) ×3 IMPLANT
SUT ETHILON 3 0 PS 1 (SUTURE) ×6 IMPLANT
SUT VIC AB 0 CT1 27 (SUTURE) ×6
SUT VIC AB 0 CT1 27XBRD ANBCTR (SUTURE) ×2 IMPLANT
SUT VIC AB 2-0 CT1 27 (SUTURE)
SUT VIC AB 2-0 CT1 TAPERPNT 27 (SUTURE) IMPLANT
SYR CONTROL 10ML LL (SYRINGE) IMPLANT
TOWEL OR 17X24 6PK STRL BLUE (TOWEL DISPOSABLE) ×3 IMPLANT
TOWEL OR 17X26 10 PK STRL BLUE (TOWEL DISPOSABLE) ×6 IMPLANT
TUBE CONNECTING 12'X1/4 (SUCTIONS) ×1
TUBE CONNECTING 12X1/4 (SUCTIONS) ×2 IMPLANT
UNDERPAD 30X30 (UNDERPADS AND DIAPERS) ×3 IMPLANT

## 2018-11-01 NOTE — Anesthesia Procedure Notes (Signed)
Anesthesia Regional Block: Supraclavicular block   Pre-Anesthetic Checklist: ,, timeout performed, Correct Patient, Correct Site, Correct Laterality, Correct Procedure, Correct Position, site marked, Risks and benefits discussed,  Surgical consent,  Pre-op evaluation,  At surgeon's request and post-op pain management  Laterality: Right  Prep: chloraprep       Needles:  Injection technique: Single-shot  Needle Type: Echogenic Needle     Needle Length: 5cm  Needle Gauge: 21     Additional Needles:   Procedures:,,,, ultrasound used (permanent image in chart),,,,  Narrative:  Start time: 11/01/2018 7:07 AM End time: 11/01/2018 7:16 AM Injection made incrementally with aspirations every 5 mL.  Performed by: Personally  Anesthesiologist: Barnet Glasgow, MD  Additional Notes: Block Assesed prior to surgery

## 2018-11-01 NOTE — Anesthesia Postprocedure Evaluation (Signed)
Anesthesia Post Note  Patient: [name]  Procedure(s) Performed: OPEN REDUCTION INTERNAL FIXATION (ORIF)  RIGHT DISTAL RADIAL FRACTURE (Right Arm Lower)     Patient location during evaluation: Endoscopy Anesthesia Type: General Level of consciousness: awake and alert Pain management: pain level controlled Vital Signs Assessment: post-procedure vital signs reviewed and stable Respiratory status: spontaneous breathing, nonlabored ventilation, respiratory function stable and patient connected to nasal cannula oxygen Cardiovascular status: blood pressure returned to baseline and stable Postop Assessment: no apparent nausea or vomiting Anesthetic complications: no    Last Vitals:  Vitals:   11/01/18 0725 11/01/18 1011  BP: (!) 120/51 (!) 107/56  Pulse: (!) 58 64  Resp: 14 11  Temp:    SpO2: 99% 98%    Last Pain:  Vitals:   11/01/18 1011  TempSrc:   PainSc: [place] A [place]

## 2018-11-01 NOTE — Discharge Instructions (Signed)
Orthopaedic Trauma Service Discharge Instructions   General Discharge Instructions  WEIGHT BEARING STATUS: Nonweightbearing Right wrist and arm   RANGE OF MOTION/ACTIVITY: ok to move fingers to tolerance, Range of motion right elbow as tolerated.  Elevate fingers above wrist and and wrist above heart to help with swelling control.   Wound Care: do not remove splint, do not get splint wet. Keep splint dry    Diet: as you were eating previously.  Can use over the counter stool softeners and bowel preparations, such as Miralax, to help with bowel movements.  Narcotics can be constipating.  Be sure to drink plenty of fluids  PAIN MEDICATION USE AND EXPECTATIONS  You have likely been given narcotic medications to help control your pain.  After a traumatic event that results in an fracture (broken bone) with or without surgery, it is ok to use narcotic pain medications to help control one's pain.  We understand that everyone responds to pain differently and each individual patient will be evaluated on a regular basis for the continued need for narcotic medications. Ideally, narcotic medication use should last no more than 6-8 weeks (coinciding with fracture healing).   As a patient it is your responsibility as well to monitor narcotic medication use and report the amount and frequency you use these medications when you come to your office visit.   We would also advise that if you are using narcotic medications, you should take a dose prior to therapy to maximize you participation.  IF YOU ARE ON NARCOTIC MEDICATIONS IT IS NOT PERMISSIBLE TO OPERATE A MOTOR VEHICLE (MOTORCYCLE/CAR/TRUCK/MOPED) OR HEAVY MACHINERY DO NOT MIX NARCOTICS WITH OTHER CNS (CENTRAL NERVOUS SYSTEM) DEPRESSANTS SUCH AS ALCOHOL   STOP SMOKING OR USING NICOTINE PRODUCTS!!!!  As discussed nicotine severely impairs your body's ability to heal surgical and traumatic wounds but also impairs bone healing.  Wounds and bone heal by  forming microscopic blood vessels (angiogenesis) and nicotine is a vasoconstrictor (essentially, shrinks blood vessels).  Therefore, if vasoconstriction occurs to these microscopic blood vessels they essentially disappear and are unable to deliver necessary nutrients to the healing tissue.  This is one modifiable factor that you can do to dramatically increase your chances of healing your injury.    (This means no smoking, no nicotine gum, patches, etc)  DO NOT USE NONSTEROIDAL ANTI-INFLAMMATORY DRUGS (NSAID'S)  Using products such as Advil (ibuprofen), Aleve (naproxen), Motrin (ibuprofen) for additional pain control during fracture healing can delay and/or prevent the healing response.  If you would like to take over the counter (OTC) medication, Tylenol (acetaminophen) is ok.  However, some narcotic medications that are given for pain control contain acetaminophen as well. Therefore, you should not exceed more than 4000 mg of tylenol in a day if you do not have liver disease.  Also note that there are may OTC medicines, such as cold medicines and allergy medicines that my contain tylenol as well.  If you have any questions about medications and/or interactions please ask your doctor/PA or your pharmacist.      ICE AND ELEVATE INJURED/OPERATIVE EXTREMITY  Using ice and elevating the injured extremity above your heart can help with swelling and pain control.  Icing in a pulsatile fashion, such as 20 minutes on and 20 minutes off, can be followed.    Do not place ice directly on skin. Make sure there is a barrier between to skin and the ice pack.    Using frozen items such as frozen peas works  well as the conform nicely to the are that needs to be iced.    IF YOU ARE IN A SPLINT OR CAST DO NOT REMOVE IT FOR ANY REASON   If your splint gets wet for any reason please contact the office immediately. You may shower in your splint or cast as long as you keep it dry.  This can be done by wrapping in a cast  cover or garbage back (or similar)  Do Not stick any thing down your splint or cast such as pencils, money, or hangers to try and scratch yourself with.  If you feel itchy take benadryl as prescribed on the bottle for itching   CALL THE OFFICE WITH ANY QUESTIONS OR CONCERNS: [phone]

## 2018-11-01 NOTE — Transfer of Care (Signed)
Immediate Anesthesia Transfer of Care Note  Patient: [name]  Procedure(s) Performed: OPEN REDUCTION INTERNAL FIXATION (ORIF)  RIGHT DISTAL RADIAL FRACTURE (Right Arm Lower)  Patient Location: PACU  Anesthesia Type:General  Level of Consciousness: awake, alert  and oriented  Airway & Oxygen Therapy: Patient Spontanous Breathing and Patient connected to nasal cannula oxygen  Post-op Assessment: Report given to RN and Post -op Vital signs reviewed and stable  Post vital signs: Reviewed and stable  Last Vitals:  Vitals Value Taken Time  BP 107/56 11/01/2018 10:11 AM  Temp    Pulse 63 11/01/2018 10:13 AM  Resp 12 11/01/2018 10:13 AM  SpO2 97 % 11/01/2018 10:13 AM  Vitals shown include unvalidated device data.  Last Pain:  Vitals:   11/01/18 0725  TempSrc:   PainSc: 0-No pain      Patients Stated Pain Goal: 5 (30/05/11 0211)  Complications: No apparent anesthesia complications

## 2018-11-01 NOTE — Op Note (Signed)
11/01/2018  10:14 AM  PATIENT:  [name]  78 y.o. female  PRE-OPERATIVE DIAGNOSIS:  RIGHT DISTAL RADIUS FRACTURE  POST-OPERATIVE DIAGNOSIS:  RIGHT DISTAL RADIUS FRACTURE  PROCEDURE:  Procedure(s): OPEN REDUCTION INTERNAL FIXATION (ORIF)  RIGHT DISTAL RADIAL FRACTURE (Right)  SURGEON:  Surgeon(s) and Role:    Altamese [place], MD - Primary  ASSISTANT: None  ANESTHESIA:   general  I/O:  Total I/O In: 700 [I.V.:700] Out: 20 [Blood:20]  SPECIMEN:  No Specimen  TOURNIQUET:  * No tourniquets in log *  DICTATION: .Note written in EPIC  DISPOSITION: PACU, Stable   BRIEF SUMMARY AND INDICATION FOR PROCEDURE:   [name] is a 78 y.o. who sustained a displaced, severely angulated distal radius fracture.  I did discuss with the patient the risks and benefits of surgical repair including the potential for arthritis, loss of motion, DVT, PE, symptomatic hardware, nerve injury, vessel injury, infection, and need for further surgery among others. After full discussion, the patient wished to proceed.   BRIEF SUMMARY OF PROCEDURE:  After administration of preoperative antibiotics, the patient was taken to the operating room where general anesthesia was induced.  The upper extremity was prepped and draped in usual sterile fashion. Tourniquet was placed about the arm, but never inflated during the procedure.  I began with a longitudinal incision extending to the wrist flexion crease.  The FCR tendon sheath was exposed.  The tendon retracted radially and the deep portion of the tendon sheath incised.  The pronator was divided from the radial edge and this exposed the distal volar radius.  A reduction maneuver was performed and held on a towel bump with improvement in alignment but it was inadequate to completely restore appropriate tilt and angulation. While holding this provisional reduction, the subchondral bone was elevated into congruence. I then applied the distal radius plate  with the plate tilted off the bone by 30 degrees and secured it into the epiphyseal segment. I then placed a series of K-wires and later PEG fixation into the epiphysis.  I then checked plate position and reduction at the articular surface.  I brought the plate down to the metaphysis and secured it with standard screws and then checked the tilt, which had been restored to what appeared to be anatomic.  This was followed by additional screws in the metaphysis.  Final reconstruction showed excellent articular congruity with restoration of radial inclination, height, and tilt.  All screws appeared to be of appropriate length.  The wound was irrigated copiously.  The pronator tagged back along the radial edge and then the subcu with 2-0 Vicryl and the skin with nylon.  Sterile gently compressive dressing and volar splint was then applied with the patient's hand and wrist in neutral position. A scrub assisted me throughout with retraction, reduction, and closure.   PROGNOSIS: The patient will have unrestricted range of motion of the elbow and return to the office in 10-14 days for removal of sutures.  At that time, we will likely convert into a removable splint vs cast application if concerns about noncompliance.          Astrid Divine. Marcelino Scot, M.D.

## 2018-11-01 NOTE — Anesthesia Procedure Notes (Signed)
Procedure Name: Intubation Date/Time: 11/01/2018 8:30 AM Performed by: Inda Coke, CRNA Pre-anesthesia Checklist: Patient identified, Emergency Drugs available, Suction available and Patient being monitored Patient Re-evaluated:Patient Re-evaluated prior to induction Oxygen Delivery Method: Circle System Utilized Preoxygenation: Pre-oxygenation with 100% oxygen Induction Type: IV induction Ventilation: Mask ventilation without difficulty Laryngoscope Size: Mac and 3 Grade View: Grade I Tube type: Oral Tube size: 7.0 mm Number of attempts: 1 Airway Equipment and Method: Stylet and Oral airway Placement Confirmation: ETT inserted through vocal cords under direct vision,  positive ETCO2 and breath sounds checked- equal and bilateral Secured at: 22 cm Tube secured with: Tape Dental Injury: Teeth and Oropharynx as per pre-operative assessment

## 2018-11-02 ENCOUNTER — Other Ambulatory Visit: Payer: Self-pay

## 2018-11-02 ENCOUNTER — Emergency Department (HOSPITAL_COMMUNITY): Payer: Medicare Other

## 2018-11-02 ENCOUNTER — Encounter (HOSPITAL_COMMUNITY): Payer: Self-pay | Admitting: Emergency Medicine

## 2018-11-02 ENCOUNTER — Emergency Department (HOSPITAL_COMMUNITY)
Admission: EM | Admit: 2018-11-02 | Discharge: 2018-11-02 | Disposition: A | Discharge status: Home / Self Care | Payer: Medicare Other | Attending: Emergency Medicine | Admitting: Emergency Medicine

## 2018-11-02 DIAGNOSIS — Z853 Personal history of malignant neoplasm of breast: Secondary | ICD-10-CM | POA: Diagnosis not present

## 2018-11-02 DIAGNOSIS — Z79899 Other long term (current) drug therapy: Secondary | ICD-10-CM | POA: Insufficient documentation

## 2018-11-02 DIAGNOSIS — I10 Essential (primary) hypertension: Secondary | ICD-10-CM | POA: Insufficient documentation

## 2018-11-02 DIAGNOSIS — M25531 Pain in right wrist: Secondary | ICD-10-CM | POA: Diagnosis present

## 2018-11-02 DIAGNOSIS — G8918 Other acute postprocedural pain: Secondary | ICD-10-CM | POA: Diagnosis not present

## 2018-11-02 DIAGNOSIS — F17220 Nicotine dependence, chewing tobacco, uncomplicated: Secondary | ICD-10-CM | POA: Diagnosis not present

## 2018-11-02 DIAGNOSIS — Z96642 Presence of left artificial hip joint: Secondary | ICD-10-CM | POA: Diagnosis not present

## 2018-11-02 LAB — VITAMIN D 25 HYDROXY (VIT D DEFICIENCY, FRACTURES): Vit D, 25-Hydroxy: 19.4 ng/mL — ABNORMAL LOW (ref 30.0–100.0)

## 2018-11-02 MED ORDER — OXYCODONE-ACETAMINOPHEN 5-325 MG PO TABS
2.0000 | ORAL_TABLET | Freq: Once | ORAL | Status: AC
  Administered 2018-11-02: 2 via ORAL
  Filled 2018-11-02: qty 2

## 2018-11-02 NOTE — Discharge Instructions (Signed)
Please see your surgeon at your follow up appointment You may take up to 2 tablets of the pain medicine (oxycodone / acetaminophen), every 6 hours - we prefer that you take 1 but if you need 2, you may take 2.  Please use a stool softener with this.  The pain is likely coming from the nerve block, or anesthesia medicine wearing off yesterday after your surgery.  Emergency department for severe or worsening pain swelling or numbness of the fingers.

## 2018-11-02 NOTE — ED Notes (Signed)
Paged ortho to place splint on right arm prior to discharge.

## 2018-11-02 NOTE — ED Triage Notes (Signed)
Pt had a fall recently and fx her right wrist. Pt had surgery yesterday, but continues to have pain- into upper right arm, neck and back.

## 2018-11-02 NOTE — Progress Notes (Signed)
Orthopedic Tech Progress Note Patient Details:  [name] [birth year] [id number]  Ortho Devices Type of Ortho Device: Ace wrap, Short arm splint Ortho Device/Splint Interventions: Application       [place] Pink 11/02/2018, 9:50 AM

## 2018-11-02 NOTE — ED Provider Notes (Signed)
[facility] EMERGENCY DEPARTMENT Provider Note   CSN: [id number] Arrival date & time: 11/02/18  0830    History   Chief Complaint Chief Complaint  Patient presents with  . Post-op Problem    HPI [name] is a 78 y.o. female.     HPI  The patient is a 78 year old female, she has a known history of alcohol abuse, history of breast cancer, history of a stroke and a history of a recent fall which ended up with a right distal radius fracture which occurred around March 11 and was repaired yesterday by Dr. Marcelino Scot.  The patient is right-hand dominant, this was a closed distal radius fracture, she had been splinted for the week prior to the surgery.  The patient reports that this morning she has had progressive pain which is been progressive overnight, she denies numbness of her fingers but states that any movement of her arm causes pain which not only hurts in the wrist but radiates up into the upper arm and up into the neck.  She denies any other pain in her body, she was given a single oxycodone with Tylenol in it this morning and states it has not helped and continues to have severe pain.  She is wearing her volar immobilizer as well as her sling.  Past Medical History:  Diagnosis Date  . Alcohol abuse   . Anemia   . Arthritis   . Bacterial vaginosis   . Cancer ([place]) 01/2015   Breast cancer right   . Cerebrovascular accident (stroke) ([place]) 2/07   left frontal lobe  . Closed left hip fracture ([place]) 5/14  . Complication of anesthesia    woke up too early  with kidney stone surgery and wrist surgery  . Depression   . Dupuytren's contracture   . Fibrocystic breast disease   . Fracture of metatarsal bone of right foot    5th metatarsal  . GERD (gastroesophageal reflux disease)   . Gout   . Headache   . History of blood transfusion   . Hyperlipidemia LDL goal <70   . Hypertension   . Paroxysmal A-fib ([place])   . Renal calculus   . Vitamin B12 deficiency    . Vitamin D deficiency     Patient Active Problem List   Diagnosis Date Noted  . Palpitations 11/19/2017  . Atypical chest pain 11/19/2017  . Pleuritic chest pain 10/09/2017  . Hypoxia 10/09/2017  . Appendicitis with perforation   . Acute appendicitis 10/06/2017  . Fall 04/10/2017  . Acute lower UTI 04/10/2017  . Accelerated hypertension 04/10/2017  . Malignant neoplasm of upper-outer quadrant of right breast in female, estrogen receptor positive ([place]) 01/07/2016  . Stroke ([place]) 06/05/2014  . Alcohol dependence ([place]) 06/05/2014  . Dyslipidemia 06/05/2014  . Syncope 06/04/2014  . Postoperative anemia due to acute blood loss 12/19/2012  . Osteoporosis 12/19/2012  . Osteoporosis with fracture 12/19/2012  . Hip fracture, left ([place]) 12/16/2012  . Gout 12/16/2012  . fracture of fifth right metatarsal bone 12/16/2012  . Alcohol abuse 12/16/2012  . GERD (gastroesophageal reflux disease) 12/16/2012  . PAF (paroxysmal atrial fibrillation) ([place]) 12/16/2012  . B12 deficiency 12/16/2012  . Vitamin D deficiency 12/16/2012  . Esophageal stricture 12/16/2012  . Other and unspecified hyperlipidemia 12/16/2012  . Reactive depression (situational) 12/16/2012  . Insomnia 12/16/2012  . H/O: CVA (cerebrovascular accident) 12/16/2012  . Dupuytren's contracture of both hands 12/16/2012  . Fibrocystic breast disease 12/16/2012    Past  Surgical History:  Procedure Laterality Date  . ABDOMINAL HYSTERECTOMY    . cataracts Bilateral   . COLONOSCOPY    . ESOPHAGEAL MANOMETRY N/A 08/15/2017   Procedure: ESOPHAGEAL MANOMETRY (EM);  Surgeon: Ronnette Juniper, MD;  Location: [facility];  Service: Gastroenterology;  Laterality: N/A;  . ESOPHAGOGASTRODUODENOSCOPY (EGD) WITH PROPOFOL N/A 05/02/2016   Procedure: ESOPHAGOGASTRODUODENOSCOPY (EGD) WITH PROPOFOL;  Surgeon: Garlan Fair, MD;  Location: [facility];  Service: Endoscopy;  Laterality: N/A;  . HIP ARTHROPLASTY Left 12/17/2012   Procedure: LEFT HIP  HEMI ARTHROPLASTY ;  Surgeon: Rozanna Box, MD;  Location: [facility];  Service: Orthopedics;  Laterality: Left;  . LAPAROSCOPIC APPENDECTOMY N/A 10/05/2017   Procedure: APPENDECTOMY LAPAROSCOPIC;  Surgeon: Greer Pickerel, MD;  Location: [facility];  Service: General;  Laterality: N/A;  . left wrist surgery surgery  7 yrs ago  . LEG SURGERY Left   . LITHOTRIPSY    . MASTECTOMY W/ SENTINEL NODE BIOPSY Bilateral 01/20/2016   Procedure: BILATERAL TOTAL MASTECTOMY WITH RIGHT SENTINEL LYMPH NODE BIOPSY;  Surgeon: Rolm Bookbinder, MD;  Location: [facility];  Service: General;  Laterality: Bilateral;     OB History   No obstetric history on file.      Home Medications    Prior to Admission medications   Medication Sig Start Date End Date Taking? Authorizing Provider  albuterol (VENTOLIN HFA) 108 (90 Base) MCG/ACT inhaler Inhale 1-2 puffs into the lungs every 6 (six) hours as needed for wheezing or shortness of breath.    [provider]  amLODipine (NORVASC) 5 MG tablet Take 5 mg by mouth daily.    [provider]  cholecalciferol (VITAMIN D3) 25 MCG (1000 UT) tablet Take 1,000 Units by mouth daily.    [provider]  Cyanocobalamin (VITAMIN B-12 IJ) Inject 1 application as directed every 30 (thirty) days.    [provider]  metoprolol succinate (TOPROL-XL) 25 MG 24 hr tablet Take 25 mg by mouth daily.    [provider]  nitroGLYCERIN (NITROSTAT) 0.4 MG SL tablet Place 1 tablet under the tongue as needed for chest pain.  11/13/17   [provider]  omeprazole (PRILOSEC) 20 MG capsule Take 1 capsule by mouth 2 (two) times daily. 10/24/18   [provider]  oxyCODONE-acetaminophen (PERCOCET/ROXICET) 5-325 MG tablet Take 1 tablet by mouth every 6 (six) hours as needed for severe pain. 10/23/18   Hayden Rasmussen, MD  vitamin B-12 (CYANOCOBALAMIN) 100 MCG tablet Take 100 mcg by mouth daily.    [provider]    Family History Family  History  Problem Relation Age of Onset  . Stroke Mother   . Hypertension Father   . CAD Father     Social History Social History   Tobacco Use  . Smoking status: Never Smoker  . Smokeless tobacco: Current User    Types: Chew  Substance Use Topics  . Alcohol use: Yes    Alcohol/week: 21.0 standard drinks    Types: 21 Cans of beer per week    Comment:  a little liquor   . Drug use: No     Allergies   Vicodin [hydrocodone-acetaminophen] and Ambien [zolpidem tartrate]   Review of Systems Review of Systems  Musculoskeletal: Positive for joint swelling and neck pain.  Skin: Positive for wound.  Neurological: Negative for numbness.  Hematological: Does not bruise/bleed easily.     Physical Exam Updated Vital Signs BP (!) 165/88 (BP Location: Left Arm)   Pulse 74  Temp 97.7 F (36.5 C) (Oral)   Resp 15   Ht 1.778 m (5\' 10" )   Wt 54.4 kg   SpO2 100%   BMI 17.22 kg/m   Physical Exam Vitals signs and nursing note reviewed.  Constitutional:      Appearance: She is well-developed. She is not diaphoretic.     Comments: Uncomfortable appearing  HENT:     Head: Normocephalic and atraumatic.  Eyes:     General:        Right eye: No discharge.        Left eye: No discharge.     Conjunctiva/sclera: Conjunctivae normal.  Cardiovascular:     Rate and Rhythm: Normal rate and regular rhythm.  Pulmonary:     Effort: Pulmonary effort is normal. No respiratory distress.  Musculoskeletal:        General: Swelling and tenderness present.     Comments: Right wrist with mild swelling, distal forearm with mild swelling, incision is intact, small amount of blood present at the wound, no surrounding erythema or warmth, compartment feels soft though tender.  The patient has tenderness to palpation around the elbow, the biceps and triceps and up into the shoulder though there is no visible deformity or redness of the skin.  There is no swelling of those other compartments.  Skin:     General: Skin is warm and dry.     Findings: No erythema or rash.  Neurological:     Mental Status: She is alert.     Coordination: Coordination normal.     Comments: The patient is able to flex and extend the fingers a small amount but states it hurts to do that.      ED Treatments / Results  Labs (all labs ordered are listed, but only abnormal results are displayed) Labs Reviewed - No data to display   Radiology Dg Wrist 2 Views Right  Result Date: 11/01/2018 CLINICAL DATA:  Fracture. EXAM: DG C-ARM 61-120 MIN; RIGHT WRIST - 2 VIEW COMPARISON:  10/23/2018. FINDINGS: ORIF distal right radial fracture. Hardware intact. Anatomic alignment. IMPRESSION: ORIF distal right radial fracture with anatomic alignment. Electronically Signed   By: Marcello Moores  Register   On: 11/01/2018 10:12   Dg Wrist Complete Right  Result Date: 11/01/2018 CLINICAL DATA:  Status post ORIF of distal radius fracture. EXAM: RIGHT WRIST - COMPLETE 3+ VIEW COMPARISON:  Intraoperative fluoroscopic images 11/01/2018. Right wrist radiographs 10/23/2018. FINDINGS: ORIF of a distal radius fracture is again noted with a volar plate and screws in place. Alignment is anatomic. No new fracture or dislocation is identified. Splint material is in place. IMPRESSION: ORIF of distal radius fracture in anatomic alignment. Electronically Signed   By: Logan Bores M.D.   On: 11/01/2018 11:07   Dg C-arm 1-60 Min  Result Date: 11/01/2018 CLINICAL DATA:  Fracture. EXAM: DG C-ARM 61-120 MIN; RIGHT WRIST - 2 VIEW COMPARISON:  10/23/2018. FINDINGS: ORIF distal right radial fracture. Hardware intact. Anatomic alignment. IMPRESSION: ORIF distal right radial fracture with anatomic alignment. Electronically Signed   By: Marcello Moores  Register   On: 11/01/2018 10:12    Procedures Procedures (including critical care time)  Medications Ordered in ED Medications  oxyCODONE-acetaminophen (PERCOCET/ROXICET) 5-325 MG per tablet 2 tablet (has no  administration in time range)     Initial Impression / Assessment and Plan / ED Course  I have reviewed the triage vital signs and the nursing notes.  Pertinent labs & imaging results that were available during my care  of the patient were reviewed by me and considered in my medical decision making (see chart for details).  Clinical Course as of Nov 02 931  Sat Nov 02, 2018  0931 Has been seen by Sophronia Simas - they agree with d/c home and f/u.  No concern for compartment syndrome   [BM]    Clinical Course User Index [BM] Noemi Chapel, MD      It does not seem that the patient has a compartment syndrome, she is postop about 24 hours and having increased significant pain with tenderness to any palpation over the forearm.  I will discuss with orthopedics, obtain an x-ray postoperatively and give the patient a dose of stronger pain medication.  The patient was given instructions on follow-up  Final Clinical Impressions(s) / ED Diagnoses   Final diagnoses:  Post-operative pain      Noemi Chapel, MD 11/02/18 [phone]

## 2018-11-02 NOTE — Progress Notes (Signed)
Saw patient at ER request.  Had ORIF R distal radius with Dr. Marcelino Scot yesterday and increased pain overnight.  Block seemed to have worn off as pain started.  No numbness and tingling, chronic finger contractures aside soft and supple motion of fingers with some mild discomfort.  Soft compartments of hand and forearm.  WWP hand.  Recommend elevation, reassurance, counseled family on pain control and she should followup with [facility] as scheduled.  Splint replaced by Ortho tech.  Incision CDI.

## 2018-11-04 ENCOUNTER — Other Ambulatory Visit (HOSPITAL_COMMUNITY): Payer: Medicare Other

## 2018-11-04 ENCOUNTER — Encounter (HOSPITAL_COMMUNITY): Payer: Self-pay | Admitting: Orthopedic Surgery

## 2018-11-12 ENCOUNTER — Telehealth: Payer: Self-pay | Admitting: Oncology

## 2018-11-12 NOTE — Telephone Encounter (Signed)
Scheduled appt per 3/27 sch message - sent reminder letter in the mail

## 2018-11-21 ENCOUNTER — Other Ambulatory Visit: Payer: Self-pay | Admitting: Gastroenterology

## 2018-11-21 DIAGNOSIS — K225 Diverticulum of esophagus, acquired: Secondary | ICD-10-CM

## 2018-11-21 DIAGNOSIS — R1310 Dysphagia, unspecified: Secondary | ICD-10-CM

## 2018-11-22 ENCOUNTER — Ambulatory Visit
Admission: RE | Admit: 2018-11-22 | Discharge: 2018-11-22 | Disposition: A | Discharge status: Home / Self Care | Payer: Medicare Other | Source: Ambulatory Visit | Attending: Gastroenterology | Admitting: Gastroenterology

## 2018-11-22 DIAGNOSIS — K225 Diverticulum of esophagus, acquired: Secondary | ICD-10-CM

## 2018-11-22 DIAGNOSIS — R1310 Dysphagia, unspecified: Secondary | ICD-10-CM

## 2019-01-20 ENCOUNTER — Telehealth: Payer: Self-pay | Admitting: Cardiology

## 2019-01-20 NOTE — Telephone Encounter (Signed)
Schedule in the office inext week

## 2019-01-20 NOTE — Telephone Encounter (Signed)
Telephone call to patient  To schedule office visit next week instead of virtual visit. No answer and no voicemail to leave message. Will continue efforts

## 2019-01-20 NOTE — Telephone Encounter (Signed)
[name] has a virtual visit scheduled for 01/24/2019 for surgery clearance but wants it to be an office visit.  I told her we would call her back andlet her know of we can do that

## 2019-01-21 ENCOUNTER — Telehealth: Payer: Self-pay

## 2019-01-21 NOTE — Telephone Encounter (Signed)
New pt referral info faxed to [facility]

## 2019-01-23 NOTE — Telephone Encounter (Signed)
Number was busy when I tried to call back will continue efforts.

## 2019-01-24 ENCOUNTER — Telehealth: Payer: Medicare Other | Admitting: Cardiology

## 2019-01-24 ENCOUNTER — Other Ambulatory Visit: Payer: Self-pay

## 2019-01-27 NOTE — Telephone Encounter (Signed)
Still unable to reach patient at this number.

## 2019-01-27 NOTE — Telephone Encounter (Signed)
Still unable to reach patient due to multiple unsuccessful attempted call will be removed from work que.

## 2019-01-30 ENCOUNTER — Telehealth: Payer: Self-pay | Admitting: *Deleted

## 2019-01-30 NOTE — Telephone Encounter (Signed)
Faxed surgical clearance to Dr. Bertis Ruddy office.

## 2019-02-03 ENCOUNTER — Telehealth: Payer: Medicare Other | Admitting: Cardiology

## 2019-05-14 ENCOUNTER — Inpatient Hospital Stay: Payer: Medicare Other | Attending: Oncology | Admitting: Oncology

## 2019-05-14 ENCOUNTER — Encounter: Payer: Self-pay | Admitting: Oncology

## 2019-05-14 DIAGNOSIS — C50411 Malignant neoplasm of upper-outer quadrant of right female breast: Secondary | ICD-10-CM

## 2019-05-14 DIAGNOSIS — Z170 Estrogen receptor positive status [ER+]: Secondary | ICD-10-CM

## 2019-05-14 NOTE — Progress Notes (Signed)
No show

## 2019-09-22 ENCOUNTER — Other Ambulatory Visit: Payer: Self-pay | Admitting: Internal Medicine

## 2019-09-22 ENCOUNTER — Other Ambulatory Visit (HOSPITAL_COMMUNITY): Payer: Self-pay | Admitting: Internal Medicine

## 2019-09-22 DIAGNOSIS — R109 Unspecified abdominal pain: Secondary | ICD-10-CM

## 2019-10-02 ENCOUNTER — Encounter (HOSPITAL_COMMUNITY)
Admission: RE | Admit: 2019-10-02 | Discharge: 2019-10-02 | Disposition: A | Discharge status: Home / Self Care | Payer: Medicare Other | Source: Ambulatory Visit | Attending: Internal Medicine | Admitting: Internal Medicine

## 2019-10-02 ENCOUNTER — Other Ambulatory Visit: Payer: Self-pay

## 2019-10-02 DIAGNOSIS — R109 Unspecified abdominal pain: Secondary | ICD-10-CM | POA: Insufficient documentation

## 2019-10-02 MED ORDER — TECHNETIUM TC 99M MEBROFENIN IV KIT
5.2000 | PACK | Freq: Once | INTRAVENOUS | Status: AC | PRN
  Administered 2019-10-02: 5.2 via INTRAVENOUS

## 2019-10-12 ENCOUNTER — Ambulatory Visit: Payer: Medicare Other | Attending: Internal Medicine

## 2019-10-12 DIAGNOSIS — Z23 Encounter for immunization: Secondary | ICD-10-CM

## 2019-10-12 NOTE — Progress Notes (Signed)
   Covid-19 Vaccination Clinic  Name:  [name]    MRN: [id number] DOB: 1941/06/03  10/12/2019  [name] was observed post Covid-19 immunization for 15 minutes without incidence. She was provided with Vaccine Information Sheet and instruction to access the V-Safe system.   [name] was instructed to call 911 with any severe reactions post vaccine: Marland Kitchen Difficulty breathing  . Swelling of your face and throat  . A fast heartbeat  . A bad rash all over your body  . Dizziness and weakness    Immunizations Administered    Name Date Dose VIS Date Route   Pfizer COVID-19 Vaccine 10/12/2019  4:24 PM 0.3 mL 07/25/2019 Intramuscular   Manufacturer: [place]   Lot: [id number]   [place]: [id number]

## 2019-11-04 ENCOUNTER — Encounter (HOSPITAL_COMMUNITY)
Admission: EM | Disposition: A | Discharge status: Skilled Nursing Facility | Payer: Self-pay | Source: Home / Self Care | Attending: Internal Medicine

## 2019-11-04 ENCOUNTER — Inpatient Hospital Stay (HOSPITAL_COMMUNITY): Payer: Medicare Other | Admitting: Certified Registered"

## 2019-11-04 ENCOUNTER — Inpatient Hospital Stay (HOSPITAL_COMMUNITY)
Admit: 2019-11-04 | Discharge: 2019-11-04 | Disposition: A | Discharge status: Home / Self Care | Payer: Medicare Other | Attending: Internal Medicine | Admitting: Internal Medicine

## 2019-11-04 ENCOUNTER — Inpatient Hospital Stay (HOSPITAL_COMMUNITY)
Admission: EM | Admit: 2019-11-04 | Discharge: 2019-11-09 | DRG: 521 | Disposition: A | Discharge status: Skilled Nursing Facility | Payer: Medicare Other | Attending: Internal Medicine | Admitting: Internal Medicine

## 2019-11-04 ENCOUNTER — Inpatient Hospital Stay (HOSPITAL_COMMUNITY): Payer: Medicare Other

## 2019-11-04 ENCOUNTER — Other Ambulatory Visit: Payer: Self-pay

## 2019-11-04 ENCOUNTER — Emergency Department (HOSPITAL_COMMUNITY): Payer: Medicare Other

## 2019-11-04 ENCOUNTER — Encounter (HOSPITAL_COMMUNITY): Payer: Self-pay | Admitting: Emergency Medicine

## 2019-11-04 DIAGNOSIS — Z681 Body mass index (BMI) 19 or less, adult: Secondary | ICD-10-CM | POA: Diagnosis not present

## 2019-11-04 DIAGNOSIS — M810 Age-related osteoporosis without current pathological fracture: Secondary | ICD-10-CM | POA: Diagnosis present

## 2019-11-04 DIAGNOSIS — Y92009 Unspecified place in unspecified non-institutional (private) residence as the place of occurrence of the external cause: Secondary | ICD-10-CM | POA: Diagnosis not present

## 2019-11-04 DIAGNOSIS — S72001A Fracture of unspecified part of neck of right femur, initial encounter for closed fracture: Secondary | ICD-10-CM | POA: Diagnosis present

## 2019-11-04 DIAGNOSIS — B952 Enterococcus as the cause of diseases classified elsewhere: Secondary | ICD-10-CM | POA: Diagnosis present

## 2019-11-04 DIAGNOSIS — I10 Essential (primary) hypertension: Secondary | ICD-10-CM | POA: Diagnosis present

## 2019-11-04 DIAGNOSIS — E559 Vitamin D deficiency, unspecified: Secondary | ICD-10-CM | POA: Diagnosis present

## 2019-11-04 DIAGNOSIS — W19XXXA Unspecified fall, initial encounter: Secondary | ICD-10-CM

## 2019-11-04 DIAGNOSIS — Z20822 Contact with and (suspected) exposure to covid-19: Secondary | ICD-10-CM | POA: Diagnosis present

## 2019-11-04 DIAGNOSIS — R739 Hyperglycemia, unspecified: Secondary | ICD-10-CM | POA: Diagnosis present

## 2019-11-04 DIAGNOSIS — R251 Tremor, unspecified: Secondary | ICD-10-CM | POA: Diagnosis present

## 2019-11-04 DIAGNOSIS — M1990 Unspecified osteoarthritis, unspecified site: Secondary | ICD-10-CM | POA: Diagnosis present

## 2019-11-04 DIAGNOSIS — Z96642 Presence of left artificial hip joint: Secondary | ICD-10-CM | POA: Diagnosis present

## 2019-11-04 DIAGNOSIS — D62 Acute posthemorrhagic anemia: Secondary | ICD-10-CM | POA: Diagnosis not present

## 2019-11-04 DIAGNOSIS — Z90710 Acquired absence of both cervix and uterus: Secondary | ICD-10-CM

## 2019-11-04 DIAGNOSIS — M8080XA Other osteoporosis with current pathological fracture, unspecified site, initial encounter for fracture: Secondary | ICD-10-CM | POA: Diagnosis present

## 2019-11-04 DIAGNOSIS — T148XXA Other injury of unspecified body region, initial encounter: Secondary | ICD-10-CM

## 2019-11-04 DIAGNOSIS — F17220 Nicotine dependence, chewing tobacco, uncomplicated: Secondary | ICD-10-CM | POA: Diagnosis present

## 2019-11-04 DIAGNOSIS — F1010 Alcohol abuse, uncomplicated: Secondary | ICD-10-CM | POA: Diagnosis present

## 2019-11-04 DIAGNOSIS — E538 Deficiency of other specified B group vitamins: Secondary | ICD-10-CM | POA: Diagnosis present

## 2019-11-04 DIAGNOSIS — R55 Syncope and collapse: Secondary | ICD-10-CM | POA: Diagnosis not present

## 2019-11-04 DIAGNOSIS — Z888 Allergy status to other drugs, medicaments and biological substances status: Secondary | ICD-10-CM

## 2019-11-04 DIAGNOSIS — R7989 Other specified abnormal findings of blood chemistry: Secondary | ICD-10-CM | POA: Diagnosis not present

## 2019-11-04 DIAGNOSIS — W06XXXA Fall from bed, initial encounter: Secondary | ICD-10-CM | POA: Diagnosis present

## 2019-11-04 DIAGNOSIS — E46 Unspecified protein-calorie malnutrition: Secondary | ICD-10-CM | POA: Diagnosis present

## 2019-11-04 DIAGNOSIS — E876 Hypokalemia: Secondary | ICD-10-CM | POA: Diagnosis present

## 2019-11-04 DIAGNOSIS — Z8673 Personal history of transient ischemic attack (TIA), and cerebral infarction without residual deficits: Secondary | ICD-10-CM

## 2019-11-04 DIAGNOSIS — F329 Major depressive disorder, single episode, unspecified: Secondary | ICD-10-CM | POA: Diagnosis present

## 2019-11-04 DIAGNOSIS — J189 Pneumonia, unspecified organism: Secondary | ICD-10-CM | POA: Diagnosis not present

## 2019-11-04 DIAGNOSIS — R197 Diarrhea, unspecified: Secondary | ICD-10-CM | POA: Diagnosis not present

## 2019-11-04 DIAGNOSIS — K219 Gastro-esophageal reflux disease without esophagitis: Secondary | ICD-10-CM | POA: Diagnosis present

## 2019-11-04 DIAGNOSIS — Z853 Personal history of malignant neoplasm of breast: Secondary | ICD-10-CM

## 2019-11-04 DIAGNOSIS — I4510 Unspecified right bundle-branch block: Secondary | ICD-10-CM | POA: Diagnosis present

## 2019-11-04 DIAGNOSIS — I480 Paroxysmal atrial fibrillation: Secondary | ICD-10-CM | POA: Diagnosis present

## 2019-11-04 DIAGNOSIS — Z823 Family history of stroke: Secondary | ICD-10-CM

## 2019-11-04 DIAGNOSIS — S72009A Fracture of unspecified part of neck of unspecified femur, initial encounter for closed fracture: Secondary | ICD-10-CM

## 2019-11-04 DIAGNOSIS — R0902 Hypoxemia: Secondary | ICD-10-CM | POA: Diagnosis present

## 2019-11-04 DIAGNOSIS — Z8249 Family history of ischemic heart disease and other diseases of the circulatory system: Secondary | ICD-10-CM

## 2019-11-04 DIAGNOSIS — Z79899 Other long term (current) drug therapy: Secondary | ICD-10-CM

## 2019-11-04 DIAGNOSIS — N390 Urinary tract infection, site not specified: Secondary | ICD-10-CM | POA: Diagnosis present

## 2019-11-04 DIAGNOSIS — Z9013 Acquired absence of bilateral breasts and nipples: Secondary | ICD-10-CM

## 2019-11-04 DIAGNOSIS — E785 Hyperlipidemia, unspecified: Secondary | ICD-10-CM | POA: Diagnosis present

## 2019-11-04 DIAGNOSIS — Z885 Allergy status to narcotic agent status: Secondary | ICD-10-CM

## 2019-11-04 HISTORY — PX: HIP ARTHROPLASTY: SHX981

## 2019-11-04 HISTORY — DX: Fracture of unspecified part of neck of right femur, initial encounter for closed fracture: S72.001A

## 2019-11-04 LAB — URINALYSIS, ROUTINE W REFLEX MICROSCOPIC
Bilirubin Urine: NEGATIVE
Glucose, UA: NEGATIVE mg/dL
Ketones, ur: 5 mg/dL — AB
Nitrite: NEGATIVE
Protein, ur: NEGATIVE mg/dL
Specific Gravity, Urine: 1.008 (ref 1.005–1.030)
WBC, UA: >50 WBC/hpf — ABNORMAL HIGH (ref 0–5)
pH: 5 (ref 5.0–8.0)

## 2019-11-04 LAB — SAMPLE TO BLOOD BANK

## 2019-11-04 LAB — RAPID URINE DRUG SCREEN, HOSP PERFORMED
Amphetamines: NOT DETECTED
Barbiturates: NOT DETECTED
Benzodiazepines: NOT DETECTED
Cocaine: NOT DETECTED
Opiates: NOT DETECTED
Tetrahydrocannabinol: NOT DETECTED

## 2019-11-04 LAB — CBC WITH DIFFERENTIAL/PLATELET
Abs Immature Granulocytes: 0.07 10*3/uL (ref 0.00–0.07)
Basophils Absolute: 0 10*3/uL (ref 0.0–0.1)
Basophils Relative: 0 %
Eosinophils Absolute: 0 10*3/uL (ref 0.0–0.5)
Eosinophils Relative: 0 %
HCT: 45.4 % (ref 36.0–46.0)
Hemoglobin: 14.4 g/dL (ref 12.0–15.0)
Immature Granulocytes: 1 %
Lymphocytes Relative: 7 %
Lymphs Abs: 0.8 10*3/uL (ref 0.7–4.0)
MCH: 31.6 pg (ref 26.0–34.0)
MCHC: 31.7 g/dL (ref 30.0–36.0)
MCV: 99.8 fL (ref 80.0–100.0)
Monocytes Absolute: 0.5 10*3/uL (ref 0.1–1.0)
Monocytes Relative: 4 %
Neutro Abs: 10.1 10*3/uL — ABNORMAL HIGH (ref 1.7–7.7)
Neutrophils Relative %: 88 %
Platelets: 162 10*3/uL (ref 150–400)
RBC: 4.55 MIL/uL (ref 3.87–5.11)
RDW: 13.8 % (ref 11.5–15.5)
WBC: 11.6 10*3/uL — ABNORMAL HIGH (ref 4.0–10.5)
nRBC: 0 % (ref 0.0–0.2)

## 2019-11-04 LAB — PROTIME-INR
INR: 1.1 (ref 0.8–1.2)
Prothrombin Time: 13.6 seconds (ref 11.4–15.2)

## 2019-11-04 LAB — COMPREHENSIVE METABOLIC PANEL
ALT: 15 U/L (ref 0–44)
AST: 22 U/L (ref 15–41)
Albumin: 2.8 g/dL — ABNORMAL LOW (ref 3.5–5.0)
Alkaline Phosphatase: 71 U/L (ref 38–126)
Anion gap: 15 (ref 5–15)
BUN: 8 mg/dL (ref 8–23)
CO2: 22 mmol/L (ref 22–32)
Calcium: 7.9 mg/dL — ABNORMAL LOW (ref 8.9–10.3)
Chloride: 99 mmol/L (ref 98–111)
Creatinine, Ser: 0.85 mg/dL (ref 0.44–1.00)
GFR calc Af Amer: >60 mL/min (ref >60)
GFR calc non Af Amer: >60 mL/min (ref >60)
Glucose, Bld: 150 mg/dL — ABNORMAL HIGH (ref 70–99)
Potassium: 2.9 mmol/L — ABNORMAL LOW (ref 3.5–5.1)
Sodium: 136 mmol/L (ref 135–145)
Total Bilirubin: 0.8 mg/dL (ref 0.3–1.2)
Total Protein: 5.9 g/dL — ABNORMAL LOW (ref 6.5–8.1)

## 2019-11-04 LAB — D-DIMER, QUANTITATIVE: D-Dimer, Quant: 2.48 ug/mL-FEU — ABNORMAL HIGH (ref 0.00–0.50)

## 2019-11-04 LAB — MAGNESIUM: Magnesium: 1.8 mg/dL (ref 1.7–2.4)

## 2019-11-04 LAB — BASIC METABOLIC PANEL
Anion gap: 18 — ABNORMAL HIGH (ref 5–15)
BUN: 5 mg/dL — ABNORMAL LOW (ref 8–23)
CO2: 11 mmol/L — ABNORMAL LOW (ref 22–32)
Calcium: 6.5 mg/dL — ABNORMAL LOW (ref 8.9–10.3)
Chloride: 103 mmol/L (ref 98–111)
Creatinine, Ser: <0.3 mg/dL — ABNORMAL LOW (ref 0.44–1.00)
Glucose, Bld: 67 mg/dL — ABNORMAL LOW (ref 70–99)
Potassium: 4.1 mmol/L (ref 3.5–5.1)
Sodium: 132 mmol/L — ABNORMAL LOW (ref 135–145)

## 2019-11-04 LAB — RESPIRATORY PANEL BY RT PCR (FLU A&B, COVID)
Influenza A by PCR: NEGATIVE
Influenza B by PCR: NEGATIVE
SARS Coronavirus 2 by RT PCR: NEGATIVE

## 2019-11-04 LAB — GLUCOSE, CAPILLARY: Glucose-Capillary: 139 mg/dL — ABNORMAL HIGH (ref 70–99)

## 2019-11-04 LAB — SURGICAL PCR SCREEN
MRSA, PCR: NEGATIVE
Staphylococcus aureus: NEGATIVE

## 2019-11-04 LAB — PHOSPHORUS: Phosphorus: 3.3 mg/dL (ref 2.5–4.6)

## 2019-11-04 LAB — ETHANOL: Alcohol, Ethyl (B): <10 mg/dL (ref <10)

## 2019-11-04 LAB — APTT: aPTT: 27 seconds (ref 24–36)

## 2019-11-04 SURGERY — HEMIARTHROPLASTY, HIP, DIRECT ANTERIOR APPROACH, FOR FRACTURE
Anesthesia: General | Site: Hip | Laterality: Right

## 2019-11-04 MED ORDER — ONDANSETRON HCL 4 MG PO TABS: 4.0000 mg | ORAL_TABLET | Freq: Four times a day (QID) | ORAL | Status: DC | PRN

## 2019-11-04 MED ORDER — SODIUM CHLORIDE 0.9 % IV SOLN
1.0000 g | INTRAVENOUS | Status: DC
  Administered 2019-11-04 (×2): 1 g via INTRAVENOUS
  Filled 2019-11-04: qty 1
  Filled 2019-11-04 (×2): qty 10

## 2019-11-04 MED ORDER — EPHEDRINE SULFATE 50 MG/ML IJ SOLN
INTRAMUSCULAR | Status: DC | PRN
  Administered 2019-11-04: 5 mg via INTRAVENOUS

## 2019-11-04 MED ORDER — OXYCODONE HCL 5 MG/5ML PO SOLN: 5.0000 mg | Freq: Once | ORAL | Status: DC | PRN

## 2019-11-04 MED ORDER — ONDANSETRON HCL 4 MG/2ML IJ SOLN
INTRAMUSCULAR | Status: AC
  Filled 2019-11-04: qty 2

## 2019-11-04 MED ORDER — ROCURONIUM BROMIDE 100 MG/10ML IV SOLN
INTRAVENOUS | Status: DC | PRN
  Administered 2019-11-04: 60 mg via INTRAVENOUS
  Administered 2019-11-04: 20 mg via INTRAVENOUS

## 2019-11-04 MED ORDER — SODIUM CHLORIDE 0.9 % IV BOLUS
500.0000 mL | Freq: Once | INTRAVENOUS | Status: AC
  Administered 2019-11-04: 500 mL via INTRAVENOUS

## 2019-11-04 MED ORDER — ACETAMINOPHEN 500 MG PO TABS
1000.0000 mg | ORAL_TABLET | Freq: Once | ORAL | Status: AC
  Administered 2019-11-04: 1000 mg via ORAL
  Filled 2019-11-04: qty 2

## 2019-11-04 MED ORDER — ENOXAPARIN SODIUM 40 MG/0.4ML ~~LOC~~ SOLN
40.0000 mg | SUBCUTANEOUS | Status: DC
  Administered 2019-11-05 – 2019-11-09 (×5): 40 mg via SUBCUTANEOUS
  Filled 2019-11-04 (×5): qty 0.4

## 2019-11-04 MED ORDER — PHENYLEPHRINE HCL-NACL 10-0.9 MG/250ML-% IV SOLN
INTRAVENOUS | Status: DC | PRN
  Administered 2019-11-04: 40 ug/min via INTRAVENOUS

## 2019-11-04 MED ORDER — DEXTROSE 50 % IV SOLN
INTRAVENOUS | Status: DC | PRN
  Administered 2019-11-04: 25 mL via INTRAVENOUS

## 2019-11-04 MED ORDER — OXYCODONE HCL 5 MG PO TABS: 5.0000 mg | ORAL_TABLET | Freq: Once | ORAL | Status: DC | PRN

## 2019-11-04 MED ORDER — LORAZEPAM 1 MG PO TABS
1.0000 mg | ORAL_TABLET | ORAL | Status: AC | PRN
  Administered 2019-11-06 (×2): 1 mg via ORAL
  Filled 2019-11-04 (×2): qty 1

## 2019-11-04 MED ORDER — LIDOCAINE 2% (20 MG/ML) 5 ML SYRINGE
INTRAMUSCULAR | Status: AC
  Filled 2019-11-04: qty 5

## 2019-11-04 MED ORDER — METOCLOPRAMIDE HCL 5 MG/ML IJ SOLN: 5.0000 mg | Freq: Three times a day (TID) | INTRAMUSCULAR | Status: DC | PRN

## 2019-11-04 MED ORDER — LORAZEPAM 2 MG/ML IJ SOLN: 1.0000 mg | INTRAMUSCULAR | Status: AC | PRN

## 2019-11-04 MED ORDER — PHENYLEPHRINE HCL (PRESSORS) 10 MG/ML IV SOLN
INTRAVENOUS | Status: DC | PRN
  Administered 2019-11-04: 80 ug via INTRAVENOUS

## 2019-11-04 MED ORDER — FENTANYL CITRATE (PF) 100 MCG/2ML IJ SOLN
INTRAMUSCULAR | Status: DC | PRN
  Administered 2019-11-04 (×2): 50 ug via INTRAVENOUS

## 2019-11-04 MED ORDER — PROPOFOL 10 MG/ML IV BOLUS
INTRAVENOUS | Status: AC
  Filled 2019-11-04: qty 20

## 2019-11-04 MED ORDER — METOCLOPRAMIDE HCL 5 MG PO TABS: 5.0000 mg | ORAL_TABLET | Freq: Three times a day (TID) | ORAL | Status: DC | PRN

## 2019-11-04 MED ORDER — OXYCODONE HCL 5 MG PO TABS
5.0000 mg | ORAL_TABLET | ORAL | Status: DC | PRN
  Administered 2019-11-06: 10 mg via ORAL
  Administered 2019-11-06: 5 mg via ORAL
  Filled 2019-11-04 (×3): qty 2
  Filled 2019-11-04: qty 1
  Filled 2019-11-04: qty 2

## 2019-11-04 MED ORDER — SODIUM CHLORIDE 0.9 % IR SOLN
Status: DC | PRN
  Administered 2019-11-04: 3000 mL

## 2019-11-04 MED ORDER — PHENOL 1.4 % MT LIQD: 1.0000 | OROMUCOSAL | Status: DC | PRN

## 2019-11-04 MED ORDER — OXYCODONE HCL 5 MG PO TABS
10.0000 mg | ORAL_TABLET | ORAL | Status: DC | PRN
  Administered 2019-11-05 (×2): 10 mg via ORAL

## 2019-11-04 MED ORDER — ADULT MULTIVITAMIN W/MINERALS CH
1.0000 | ORAL_TABLET | Freq: Every day | ORAL | Status: DC
  Administered 2019-11-05 – 2019-11-09 (×5): 1 via ORAL
  Filled 2019-11-04 (×5): qty 1

## 2019-11-04 MED ORDER — SODIUM CHLORIDE 0.9 % IV SOLN
1.0000 g | Freq: Once | INTRAVENOUS | Status: AC
  Administered 2019-11-04: 1 g via INTRAVENOUS
  Filled 2019-11-04: qty 10

## 2019-11-04 MED ORDER — FENTANYL CITRATE (PF) 100 MCG/2ML IJ SOLN
12.5000 ug | INTRAMUSCULAR | Status: DC | PRN
  Administered 2019-11-04: 12.5 ug via INTRAVENOUS
  Filled 2019-11-04: qty 2

## 2019-11-04 MED ORDER — SUGAMMADEX SODIUM 200 MG/2ML IV SOLN
INTRAVENOUS | Status: DC | PRN
  Administered 2019-11-04: 200 mg via INTRAVENOUS

## 2019-11-04 MED ORDER — HYDROMORPHONE HCL 1 MG/ML IJ SOLN: 0.5000 mg | INTRAMUSCULAR | Status: DC | PRN

## 2019-11-04 MED ORDER — PROMETHAZINE HCL 25 MG/ML IJ SOLN: 6.2500 mg | INTRAMUSCULAR | Status: DC | PRN

## 2019-11-04 MED ORDER — MENTHOL 3 MG MT LOZG: 1.0000 | LOZENGE | OROMUCOSAL | Status: DC | PRN

## 2019-11-04 MED ORDER — DOCUSATE SODIUM 100 MG PO CAPS
100.0000 mg | ORAL_CAPSULE | Freq: Two times a day (BID) | ORAL | Status: DC
  Administered 2019-11-04 – 2019-11-09 (×10): 100 mg via ORAL
  Filled 2019-11-04 (×10): qty 1

## 2019-11-04 MED ORDER — FOLIC ACID 1 MG PO TABS
1.0000 mg | ORAL_TABLET | Freq: Every day | ORAL | Status: DC
  Administered 2019-11-05 – 2019-11-09 (×5): 1 mg via ORAL
  Filled 2019-11-04 (×5): qty 1

## 2019-11-04 MED ORDER — VITAMIN D 25 MCG (1000 UNIT) PO TABS
2000.0000 [IU] | ORAL_TABLET | Freq: Two times a day (BID) | ORAL | Status: DC
  Administered 2019-11-04 – 2019-11-09 (×10): 2000 [IU] via ORAL
  Filled 2019-11-04 (×10): qty 2

## 2019-11-04 MED ORDER — ALBUMIN HUMAN 5 % IV SOLN: INTRAVENOUS | Status: DC | PRN

## 2019-11-04 MED ORDER — ACETAMINOPHEN 325 MG PO TABS
325.0000 mg | ORAL_TABLET | Freq: Four times a day (QID) | ORAL | Status: DC | PRN
  Administered 2019-11-05 – 2019-11-09 (×4): 650 mg via ORAL
  Filled 2019-11-04 (×4): qty 2

## 2019-11-04 MED ORDER — FENTANYL CITRATE (PF) 100 MCG/2ML IJ SOLN
50.0000 ug | Freq: Once | INTRAMUSCULAR | Status: AC
  Administered 2019-11-04: 50 ug via INTRAVENOUS
  Filled 2019-11-04: qty 2

## 2019-11-04 MED ORDER — ASCORBIC ACID 500 MG PO TABS
500.0000 mg | ORAL_TABLET | Freq: Every day | ORAL | Status: DC
  Administered 2019-11-04 – 2019-11-09 (×6): 500 mg via ORAL
  Filled 2019-11-04 (×6): qty 1

## 2019-11-04 MED ORDER — PROPOFOL 10 MG/ML IV BOLUS
INTRAVENOUS | Status: DC | PRN
  Administered 2019-11-04: 100 mg via INTRAVENOUS

## 2019-11-04 MED ORDER — LACTATED RINGERS IV SOLN: INTRAVENOUS | Status: DC | PRN

## 2019-11-04 MED ORDER — PANTOPRAZOLE SODIUM 40 MG PO TBEC
40.0000 mg | DELAYED_RELEASE_TABLET | Freq: Every day | ORAL | Status: DC
  Administered 2019-11-04 – 2019-11-09 (×6): 40 mg via ORAL
  Filled 2019-11-04 (×6): qty 1

## 2019-11-04 MED ORDER — DEXAMETHASONE SODIUM PHOSPHATE 10 MG/ML IJ SOLN
INTRAMUSCULAR | Status: DC | PRN
  Administered 2019-11-04: 10 mg via INTRAVENOUS

## 2019-11-04 MED ORDER — CEFAZOLIN SODIUM-DEXTROSE 2-4 GM/100ML-% IV SOLN
INTRAVENOUS | Status: AC
  Filled 2019-11-04: qty 100

## 2019-11-04 MED ORDER — ONDANSETRON HCL 4 MG/2ML IJ SOLN: 4.0000 mg | Freq: Four times a day (QID) | INTRAMUSCULAR | Status: DC | PRN

## 2019-11-04 MED ORDER — LIDOCAINE HCL (CARDIAC) PF 100 MG/5ML IV SOSY
PREFILLED_SYRINGE | INTRAVENOUS | Status: DC | PRN
  Administered 2019-11-04: 60 mg via INTRAVENOUS

## 2019-11-04 MED ORDER — POLYETHYLENE GLYCOL 3350 17 G PO PACK
17.0000 g | PACK | Freq: Every day | ORAL | Status: DC
  Administered 2019-11-05 – 2019-11-09 (×4): 17 g via ORAL
  Filled 2019-11-04 (×5): qty 1

## 2019-11-04 MED ORDER — FENTANYL CITRATE (PF) 100 MCG/2ML IJ SOLN: 25.0000 ug | INTRAMUSCULAR | Status: DC | PRN

## 2019-11-04 MED ORDER — MORPHINE SULFATE (PF) 2 MG/ML IV SOLN: 0.5000 mg | INTRAVENOUS | Status: DC | PRN

## 2019-11-04 MED ORDER — TRAMADOL HCL 50 MG PO TABS
50.0000 mg | ORAL_TABLET | Freq: Two times a day (BID) | ORAL | Status: DC
  Administered 2019-11-04 – 2019-11-09 (×10): 50 mg via ORAL
  Filled 2019-11-04 (×10): qty 1

## 2019-11-04 MED ORDER — DEXTROSE IN LACTATED RINGERS 5 % IV SOLN: INTRAVENOUS | Status: DC

## 2019-11-04 MED ORDER — SODIUM CHLORIDE 0.9 % IV SOLN
1.0000 g | Freq: Once | INTRAVENOUS | Status: DC
  Filled 2019-11-04: qty 10

## 2019-11-04 MED ORDER — SODIUM CHLORIDE 0.9 % IV SOLN: INTRAVENOUS | Status: DC

## 2019-11-04 MED ORDER — FENTANYL CITRATE (PF) 250 MCG/5ML IJ SOLN
INTRAMUSCULAR | Status: AC
  Filled 2019-11-04: qty 5

## 2019-11-04 MED ORDER — THIAMINE HCL 100 MG PO TABS
100.0000 mg | ORAL_TABLET | Freq: Every day | ORAL | Status: DC
  Administered 2019-11-05 – 2019-11-09 (×5): 100 mg via ORAL
  Filled 2019-11-04 (×5): qty 1

## 2019-11-04 MED ORDER — ONDANSETRON HCL 4 MG/2ML IJ SOLN
INTRAMUSCULAR | Status: DC | PRN
  Administered 2019-11-04: 4 mg via INTRAVENOUS

## 2019-11-04 MED ORDER — ALUM & MAG HYDROXIDE-SIMETH 200-200-20 MG/5ML PO SUSP: 30.0000 mL | ORAL | Status: DC | PRN

## 2019-11-04 MED ORDER — 0.9 % SODIUM CHLORIDE (POUR BTL) OPTIME
TOPICAL | Status: DC | PRN
  Administered 2019-11-04: 15:00:00 1000 mL

## 2019-11-04 MED ORDER — POTASSIUM CHLORIDE 10 MEQ/100ML IV SOLN
10.0000 meq | INTRAVENOUS | Status: AC
  Administered 2019-11-04 (×4): 10 meq via INTRAVENOUS
  Filled 2019-11-04 (×4): qty 100

## 2019-11-04 MED ORDER — THIAMINE HCL 100 MG/ML IJ SOLN: 100.0000 mg | Freq: Every day | INTRAMUSCULAR | Status: DC

## 2019-11-04 SURGICAL SUPPLY — 57 items
BIT DRILL 1/16X5 DISP (BIT) ×2 IMPLANT
BLADE SAW SAG 73X25 THK (BLADE) ×1
BLADE SAW SGTL 73X25 THK (BLADE) ×2 IMPLANT
BRUSH FEMORAL CANAL (MISCELLANEOUS) IMPLANT
BRUSH SCRUB EZ PLAIN DRY (MISCELLANEOUS) ×6 IMPLANT
COVER SURGICAL LIGHT HANDLE (MISCELLANEOUS) ×3 IMPLANT
COVER WAND RF STERILE (DRAPES) ×1 IMPLANT
DRAPE INCISE IOBAN 85X60 (DRAPES) ×3 IMPLANT
DRAPE ORTHO SPLIT 77X108 STRL (DRAPES) ×6
DRAPE SURG ORHT 6 SPLT 77X108 (DRAPES) ×2 IMPLANT
DRAPE U-SHAPE 47X51 STRL (DRAPES) ×3 IMPLANT
DRSG MEPILEX BORDER 4X8 (GAUZE/BANDAGES/DRESSINGS) ×3 IMPLANT
ELECT BLADE 6.5 EXT (BLADE) ×2 IMPLANT
ELECT CAUTERY BLADE 6.4 (BLADE) IMPLANT
ELECT REM PT RETURN 9FT ADLT (ELECTROSURGICAL) ×3
ELECTRODE REM PT RTRN 9FT ADLT (ELECTROSURGICAL) ×1 IMPLANT
GLOVE BIO SURGEON STRL SZ7.5 (GLOVE) ×3 IMPLANT
GLOVE BIO SURGEON STRL SZ8 (GLOVE) ×3 IMPLANT
GLOVE BIOGEL PI IND STRL 8 (GLOVE) ×2 IMPLANT
GLOVE BIOGEL PI INDICATOR 8 (GLOVE) ×4
GOWN STRL REUS W/ TWL LRG LVL3 (GOWN DISPOSABLE) ×2 IMPLANT
GOWN STRL REUS W/ TWL XL LVL3 (GOWN DISPOSABLE) ×1 IMPLANT
GOWN STRL REUS W/TWL 2XL LVL3 (GOWN DISPOSABLE) IMPLANT
GOWN STRL REUS W/TWL LRG LVL3 (GOWN DISPOSABLE) ×6
GOWN STRL REUS W/TWL XL LVL3 (GOWN DISPOSABLE) ×3
HANDPIECE INTERPULSE COAX TIP (DISPOSABLE)
HEAD FEM UNIPOLAR 47 OD STRL (Hips) ×2 IMPLANT
KIT BASIN OR (CUSTOM PROCEDURE TRAY) ×3 IMPLANT
KIT TURNOVER KIT B (KITS) ×3 IMPLANT
MANIFOLD NEPTUNE II (INSTRUMENTS) ×3 IMPLANT
NDL 1/2 CIR MAYO (NEEDLE) IMPLANT
NEEDLE 1/2 CIR MAYO (NEEDLE) IMPLANT
NS IRRIG 1000ML POUR BTL (IV SOLUTION) ×3 IMPLANT
PACK TOTAL JOINT (CUSTOM PROCEDURE TRAY) ×3 IMPLANT
PAD ARMBOARD 7.5X6 YLW CONV (MISCELLANEOUS) ×6 IMPLANT
PILLOW ABDUCTION MEDIUM (MISCELLANEOUS) ×2 IMPLANT
PRESSURIZER FEMORAL UNIV (MISCELLANEOUS) IMPLANT
RETRIEVER SUT HEWSON (MISCELLANEOUS) ×3 IMPLANT
SET HNDPC FAN SPRY TIP SCT (DISPOSABLE) IMPLANT
SPACER FEM TAPERED +0 12/14 (Hips) ×2 IMPLANT
STAPLER VISISTAT 35W (STAPLE) ×3 IMPLANT
STEM OFFSET DUOFIX SZ6 STD (Stem) ×2 IMPLANT
SUT ETHILON 2 0 PSLX (SUTURE) ×2 IMPLANT
SUT FIBERWIRE #2 38 T-5 BLUE (SUTURE)
SUT VIC AB 1 CT1 18XCR BRD 8 (SUTURE) ×1 IMPLANT
SUT VIC AB 1 CT1 27 (SUTURE)
SUT VIC AB 1 CT1 27XBRD ANBCTR (SUTURE) ×1 IMPLANT
SUT VIC AB 1 CT1 36 (SUTURE) ×2 IMPLANT
SUT VIC AB 1 CT1 8-18 (SUTURE) ×3
SUT VIC AB 2-0 CT1 27 (SUTURE)
SUT VIC AB 2-0 CT1 TAPERPNT 27 (SUTURE) ×2 IMPLANT
SUTURE FIBERWR #2 38 T-5 BLUE (SUTURE) ×2 IMPLANT
TOWEL GREEN STERILE (TOWEL DISPOSABLE) ×3 IMPLANT
TOWEL GREEN STERILE FF (TOWEL DISPOSABLE) ×3 IMPLANT
TOWER CARTRIDGE SMART MIX (DISPOSABLE) IMPLANT
WATER STERILE IRR 1000ML POUR (IV SOLUTION) ×3 IMPLANT
YANKAUER SUCT BULB TIP NO VENT (SUCTIONS) ×2 IMPLANT

## 2019-11-04 NOTE — Progress Notes (Signed)
Received call from [facility] that patient would be transported to [facility] for surgery at 1130 this morning.

## 2019-11-04 NOTE — Progress Notes (Signed)
Patient resting in bed with eyes closed.

## 2019-11-04 NOTE — ED Notes (Addendum)
With pt permission Pts son  updated on plan of care.

## 2019-11-04 NOTE — ED Notes (Signed)
Pt transported to CT at this time.

## 2019-11-04 NOTE — Anesthesia Preprocedure Evaluation (Addendum)
Anesthesia Evaluation  Patient identified by MRN, date of birth, ID band Patient awake    Reviewed: Allergy & Precautions, NPO status , Patient's Chart, lab work & pertinent test results, reviewed documented beta blocker date and time   History of Anesthesia Complications Negative for: history of anesthetic complications  Airway Mallampati: II  TM Distance: >3 FB Neck ROM: Full    Dental  (+) Edentulous Upper, Edentulous Lower   Pulmonary neg pulmonary ROS,    breath sounds clear to auscultation       Cardiovascular hypertension, Pt. on medications and Pt. on home beta blockers + dysrhythmias Atrial Fibrillation  Rhythm:Regular Rate:Normal  EKG 11/04/19: NSR, LAE, RBBB, minimal ST elevation in inferior leads  2019 Low risk nuclear stress test   Neuro/Psych  Headaches, PSYCHIATRIC DISORDERS Depression L frontal lobe CVA 2007 CVA (2007)    GI/Hepatic GERD  Medicated and Controlled,(+)     substance abuse  alcohol use,   Endo/Other  negative endocrine ROS  Renal/GU negative Renal ROS  negative genitourinary   Musculoskeletal  (+) Arthritis , Osteoarthritis,  Right hip fx   Abdominal   Peds  Hematology plt 162, hct 45.4   Anesthesia Other Findings K 2.9  Reproductive/Obstetrics negative OB ROS Hx R breast ca 2016                       Anesthesia Physical Anesthesia Plan  ASA: III  Anesthesia Plan: General   Post-op Pain Management:    Induction: Intravenous  PONV Risk Score and Plan: 4 or greater and Treatment may vary due to age or medical condition, Ondansetron, Dexamethasone and Midazolam  Airway Management Planned: Oral ETT  Additional Equipment: None  Intra-op Plan:   Post-operative Plan: Extubation in OR  Informed Consent:   Plan Discussed with:   Anesthesia Plan Comments:         Anesthesia Quick Evaluation

## 2019-11-04 NOTE — Progress Notes (Signed)
Patient arrives to room 1519 from ED at this time.

## 2019-11-04 NOTE — ED Notes (Signed)
Pt's oxygen level dropped to 82 on room air. When we put her on 4L of oxygen her SpO2 levels went back up into the mid 90s

## 2019-11-04 NOTE — Progress Notes (Signed)
79 y.o.femalewith medical history significant ofalcohol abuse, arthritis, breast cancer, CVA, depression, GERD, gout, hypertension, hyperlipidemia, paroxysmal A. Fib not on [place] presenting to [facility] ED from home for evaluation of right hip pain after a fall.States she was walking back from her bathroom and her vision turned dark and she fell. Since her fall she is having pain in her right hip.  Presented for further evaluation.   R hip fracture on image.  Ortho consulted and [facility] asked to admit.    Placed on CIWA protocol due to concern for impending alcohol withdrawal.  Syncope work up initiated.  On IV abx for presumptive UTI   Patient seen by [facility] physician earlier in the day.  Has been in operating room all day.

## 2019-11-04 NOTE — Op Note (Signed)
11/04/2019  PATIENT:  [name]  1941-01-12  PRE-OPERATIVE DIAGNOSIS:  DISPLACED RIGHT FEMORAL NECK FRACTURE  POST-OPERATIVE DIAGNOSIS:  DISPLACED RIGHT FEMORAL NECK FRACTURE  PROCEDURE:  Procedure(s): UNIPOLAR HEMIARTHROPLASTY OF THE RIGHT HIP with [place] POROUS COATED #6 femoral stem, standard neck, 47 mm head  SURGEON:  Surgeon(s) and Role:    Altamese Columbiana, MD - Primary  PHYSICIAN ASSISTANT: 1. KEITH PAUL, PA-C; 2. PA Student  ANESTHESIA:   general  EBL:  200 mL   BLOOD ADMINISTERED:none  DRAINS: none   LOCAL MEDICATIONS USED:  NONE  SPECIMEN:  No Specimen  DISPOSITION OF SPECIMEN:  N/A  COUNTS:  YES  TOURNIQUET:  * No tourniquets in log *  DICTATION: Note written in EPIC  PLAN OF CARE: Admit to inpatient   PATIENT DISPOSITION:  PACU - hemodynamically stable.   Delay start of Pharmacological VTE agent (>24hrs) due to surgical blood loss or risk of bleeding: no  BRIEF SUMMARY OF INDICATION FOR PROCEDURE:  [name] is a very pleasant 79 y.o. with multiple prior orthopaedic injuries, including a displaced left femoral neck fracture that under hemiarthroplasty seven years ago, who sustained a fall producing inability to bear weight, shortening, and external rotation of the extremity.  She was seen and evaluated with the recommendation for hemiarthroplasty. I discussed with the patient the risks and benefits, inclding the potential for leg length inequality, dislocation or instability, arthritis, loss of motion, DVT, PE, heart attack, stroke, and death.  Consent was given to proceed.  BRIEF SUMMARY OF PROCEDURE:  The patient was taken to the operating room where general anesthesia was induced and after administration of preoperative antibiotics consisting of 1 g of Rocephin, selected in part for UTI identified preoperatively.  She was positioned with the right side up and all prominences were padded appropriately.  We made a 10 cm incision  after the time-out, carrying dissection down to the IT band, which was split in line with the skin.  Cerebellar retractor was placed and we were able to then flex and internally rotate the hip releasing the piriformis and short rotators at their insertions.  The capsule was then T'd, tagging the corners with #1 Vicryl.  The neck cut was refined using a cutting guide and then this was followed by removal of the head, which sized perfectly to 47 mm. Acetabular trials were placed, confirming this size as the best fit. Mueller and Cobra retractors were placed along the proximal femur, which was then prepared with the canal finder,  then lateralizer, followed by reamers up to #6, and the broaches, achieving  outstanding fit and fill with the #6 broach. No calcar reamer was used.  The joint was irrigated thoroughly and the acetabulum searched multiple times for fragments and irrigated thoroughly.  Trial components were placed and the patient had outstanding stability in combine 90 degrees of flexion, adduction, and internal rotation as well as in external rotation and extension.  Consequently, actual components were placed.  My assistant [name], was necessary for delivery and control of the proximal femur during preparation, also during relocation and dislocation of the trial components as well as relocation of the actual components.  He assisted me with wound closure as well.  I did repair the capsule with #1 Vicryl and then used #2 FiberWire through bone tunnels to repair the short rotators and piriformis.  This was followed by a #1 Vicryl for the IT band and lastly 2-0 Vicryl and nylon for the  subcutaneous and skin.  Sterile gently compressive dressing was applied.  The patient was awakened from anesthesia and transported to the PACU in stable condition.  PROGNOSIS:  The patient will be weightbearing as tolerated with posterior hip precautions.  Patient has an elevated risk of  complications related to declining overall health and mobility.  [name] remains on the Medical Service and will be on DVT prophylaxis mechanically and with Lovenox while in the hospital, but unclear if longer term prophylaxis will be given at this time as patient was not one with her paroxysmal afib on admission.     [name]. Marcelino Scot, M.D.

## 2019-11-04 NOTE — Transfer of Care (Signed)
Immediate Anesthesia Transfer of Care Note  Patient: [name]  Procedure(s) Performed: ARTHROPLASTY BIPOLAR HIP (HEMIARTHROPLASTY) (Right Hip) ARTHROPLASTY BIPOLAR HIP (HEMIARTHROPLASTY) (Right Hip)  Patient Location: PACU  Anesthesia Type:General  Level of Consciousness: drowsy  Airway & Oxygen Therapy: Patient Spontanous Breathing and Patient connected to nasal cannula oxygen  Post-op Assessment: Report given to RN and Post -op Vital signs reviewed and stable  Post vital signs: Reviewed and stable  Last Vitals:  Vitals Value Taken Time  BP 157/65 11/04/19 1806  Temp    Pulse 61 11/04/19 1811  Resp 9 11/04/19 1811  SpO2 98 % 11/04/19 1811  Vitals shown include unvalidated device data.  Last Pain:  Vitals:   11/04/19 1358  TempSrc:   PainSc: 6       Patients Stated Pain Goal: 3 ([id number])  Complications: No apparent anesthesia complications

## 2019-11-04 NOTE — ED Provider Notes (Signed)
[facility] DEPT Provider Note   CSN: [id number] Arrival date & time: 11/04/19  0133   Time seen 1:40 AM  History Chief Complaint  Patient presents with  . Fall  . Hip Pain    [name] is a 79 y.o. female.  HPI   Patient was visiting her son and had gotten up to go to the bathroom and went back to bed.  She somehow fell out of bed and hit her right chest and injured her right hip.  She denies hitting her head or having loss of consciousness.  She presents via EMS who has already given her 100 mcg of fentanyl IV.  PCP Josetta Huddle, MD Orthopedics Dr. Marcelino Scot  Past Medical History:  Diagnosis Date  . Alcohol abuse   . Anemia   . Arthritis   . Bacterial vaginosis   . Cancer ([place]) 01/2015   Breast cancer right   . Cerebrovascular accident (stroke) ([place]) 2/07   left frontal lobe  . Closed left hip fracture ([place]) 5/14  . Complication of anesthesia    woke up too early  with kidney stone surgery and wrist surgery  . Depression   . Dupuytren's contracture   . Fibrocystic breast disease   . Fracture of metatarsal bone of right foot    5th metatarsal  . GERD (gastroesophageal reflux disease)   . Gout   . Headache   . History of blood transfusion   . Hyperlipidemia LDL goal <70   . Hypertension   . Paroxysmal A-fib ([place])   . Renal calculus   . Vitamin B12 deficiency   . Vitamin D deficiency     Patient Active Problem List   Diagnosis Date Noted  . Palpitations 11/19/2017  . Atypical chest pain 11/19/2017  . Pleuritic chest pain 10/09/2017  . Hypoxia 10/09/2017  . Appendicitis with perforation   . Acute appendicitis 10/06/2017  . Fall 04/10/2017  . Acute lower UTI 04/10/2017  . Accelerated hypertension 04/10/2017  . Malignant neoplasm of upper-outer quadrant of right breast in female, estrogen receptor positive ([place]) 01/07/2016  . Stroke ([place]) 06/05/2014  . Alcohol dependence ([place]) 06/05/2014  . Dyslipidemia 06/05/2014  .  Syncope 06/04/2014  . Postoperative anemia due to acute blood loss 12/19/2012  . Osteoporosis 12/19/2012  . Osteoporosis with fracture 12/19/2012  . Hip fracture, left ([place]) 12/16/2012  . Gout 12/16/2012  . fracture of fifth right metatarsal bone 12/16/2012  . Alcohol abuse 12/16/2012  . GERD (gastroesophageal reflux disease) 12/16/2012  . PAF (paroxysmal atrial fibrillation) ([place]) 12/16/2012  . B12 deficiency 12/16/2012  . Vitamin D deficiency 12/16/2012  . Esophageal stricture 12/16/2012  . Other and unspecified hyperlipidemia 12/16/2012  . Reactive depression (situational) 12/16/2012  . Insomnia 12/16/2012  . H/O: CVA (cerebrovascular accident) 12/16/2012  . Dupuytren's contracture of both hands 12/16/2012  . Fibrocystic breast disease 12/16/2012    Past Surgical History:  Procedure Laterality Date  . ABDOMINAL HYSTERECTOMY    . cataracts Bilateral   . COLONOSCOPY    . ESOPHAGEAL MANOMETRY N/A 08/15/2017   Procedure: ESOPHAGEAL MANOMETRY (EM);  Surgeon: Ronnette Juniper, MD;  Location: [facility];  Service: Gastroenterology;  Laterality: N/A;  . ESOPHAGOGASTRODUODENOSCOPY (EGD) WITH PROPOFOL N/A 05/02/2016   Procedure: ESOPHAGOGASTRODUODENOSCOPY (EGD) WITH PROPOFOL;  Surgeon: Garlan Fair, MD;  Location: [facility];  Service: Endoscopy;  Laterality: N/A;  . HIP ARTHROPLASTY Left 12/17/2012   Procedure: LEFT HIP HEMI ARTHROPLASTY ;  Surgeon: Rozanna Box, MD;  Location:  [facility] OR;  Service: Orthopedics;  Laterality: Left;  . LAPAROSCOPIC APPENDECTOMY N/A 10/05/2017   Procedure: APPENDECTOMY LAPAROSCOPIC;  Surgeon: Greer Pickerel, MD;  Location: [facility];  Service: General;  Laterality: N/A;  . left wrist surgery surgery  7 yrs ago  . LEG SURGERY Left   . LITHOTRIPSY    . MASTECTOMY W/ SENTINEL NODE BIOPSY Bilateral 01/20/2016   Procedure: BILATERAL TOTAL MASTECTOMY WITH RIGHT SENTINEL LYMPH NODE BIOPSY;  Surgeon: Rolm Bookbinder, MD;  Location: [facility];  Service: General;  Laterality:  Bilateral;  . OPEN REDUCTION INTERNAL FIXATION (ORIF) DISTAL RADIAL FRACTURE Right 11/01/2018   Procedure: OPEN REDUCTION INTERNAL FIXATION (ORIF)  RIGHT DISTAL RADIAL FRACTURE;  Surgeon: Altamese [place], MD;  Location: [facility];  Service: Orthopedics;  Laterality: Right;     OB History   No obstetric history on file.     Family History  Problem Relation Age of Onset  . Stroke Mother   . Hypertension Father   . CAD Father     Social History   Tobacco Use  . Smoking status: Never Smoker  . Smokeless tobacco: Current User    Types: Chew  Substance Use Topics  . Alcohol use: Yes    Alcohol/week: 21.0 standard drinks    Types: 21 Cans of beer per week    Comment:  a little liquor   . Drug use: No    Home Medications Prior to Admission medications   Medication Sig Start Date End Date Taking? Authorizing Provider  acetaminophen (TYLENOL) 325 MG tablet Take 325 mg by mouth every 6 (six) hours as needed for moderate pain.   Yes [provider]  amLODipine (NORVASC) 5 MG tablet Take 5 mg by mouth daily.   Yes [provider]  cholecalciferol (VITAMIN D3) 25 MCG (1000 UT) tablet Take 1,000 Units by mouth daily.   Yes [provider]  Cyanocobalamin (VITAMIN B-12 IJ) Inject 1 application as directed every 30 (thirty) days.   Yes [provider]  omeprazole (PRILOSEC) 20 MG capsule Take 1 capsule by mouth 2 (two) times daily as needed (heart burn).  10/24/18  Yes [provider]  vitamin B-12 (CYANOCOBALAMIN) 1000 MCG tablet Take 1,000 mcg by mouth daily.   Yes [provider]  albuterol (VENTOLIN HFA) 108 (90 Base) MCG/ACT inhaler Inhale 1-2 puffs into the lungs every 6 (six) hours as needed for wheezing or shortness of breath.    [provider]  metoprolol succinate (TOPROL-XL) 25 MG 24 hr tablet Take 25 mg by mouth daily.    [provider]  nitroGLYCERIN (NITROSTAT) 0.4 MG SL tablet Place 1 tablet under the tongue  every 5 (five) minutes as needed for chest pain.  11/13/17   [provider]  oxyCODONE-acetaminophen (PERCOCET/ROXICET) 5-325 MG tablet Take 1 tablet by mouth every 6 (six) hours as needed for severe pain. Patient not taking: Reported on 11/04/2019 10/23/18   Hayden Rasmussen, MD    Allergies    Vicodin [hydrocodone-acetaminophen] and Ambien [zolpidem tartrate]  Review of Systems   Review of Systems  All other systems reviewed and are negative.   Physical Exam Updated Vital Signs BP (!) 94/53   Pulse (!) 58   Temp 97.7 F (36.5 C) (Oral)   Resp 11   Ht 5\' 10"  (1.778 m)   Wt 55.3 kg   SpO2 100%   BMI 17.51 kg/m   Physical Exam Vitals and nursing note reviewed.  Constitutional:      Appearance:  Normal appearance. She is normal weight.  HENT:     Head: Normocephalic and atraumatic.     Right Ear: External ear normal.     Left Ear: External ear normal.  Eyes:     Extraocular Movements: Extraocular movements intact.     Conjunctiva/sclera: Conjunctivae normal.     Pupils: Pupils are equal, round, and reactive to light.  Cardiovascular:     Rate and Rhythm: Normal rate and regular rhythm.  Pulmonary:     Effort: Pulmonary effort is normal. No respiratory distress.     Comments: Tender in the right anterior chest wall Chest:     Chest wall: Tenderness present.  Abdominal:     General: Abdomen is flat. Bowel sounds are normal.     Palpations: Abdomen is soft.  Musculoskeletal:     Cervical back: Normal range of motion.     Comments: Right leg is shorter then left. Mild external rotation.  Pulses intact  Skin:    General: Skin is warm and dry.  Neurological:     General: No focal deficit present.     Mental Status: She is alert and oriented to person, place, and time.     Cranial Nerves: No cranial nerve deficit.  Psychiatric:        Mood and Affect: Mood normal.        Behavior: Behavior normal.        Thought Content: Thought content normal.     ED  Results / Procedures / Treatments   Labs (all labs ordered are listed, but only abnormal results are displayed) Results for orders placed or performed during the hospital encounter of 11/04/19  Comprehensive metabolic panel  Result Value Ref Range   Sodium 136 135 - 145 mmol/L   Potassium 2.9 (L) 3.5 - 5.1 mmol/L   Chloride 99 98 - 111 mmol/L   CO2 22 22 - 32 mmol/L   Glucose, Bld 150 (H) 70 - 99 mg/dL   BUN 8 8 - 23 mg/dL   Creatinine, Ser 0.85 0.44 - 1.00 mg/dL   Calcium 7.9 (L) 8.9 - 10.3 mg/dL   Total Protein 5.9 (L) 6.5 - 8.1 g/dL   Albumin 2.8 (L) 3.5 - 5.0 g/dL   AST 22 15 - 41 U/L   ALT 15 0 - 44 U/L   Alkaline Phosphatase 71 38 - 126 U/L   Total Bilirubin 0.8 0.3 - 1.2 mg/dL   GFR calc non Af Amer >60 >60 mL/min   GFR calc Af Amer >60 >60 mL/min   Anion gap 15 5 - 15  CBC with Differential  Result Value Ref Range   WBC 11.6 (H) 4.0 - 10.5 K/uL   RBC 4.55 3.87 - 5.11 MIL/uL   Hemoglobin 14.4 12.0 - 15.0 g/dL   HCT 45.4 36.0 - 46.0 %   MCV 99.8 80.0 - 100.0 fL   MCH 31.6 26.0 - 34.0 pg   MCHC 31.7 30.0 - 36.0 g/dL   RDW 13.8 11.5 - 15.5 %   Platelets 162 150 - 400 K/uL   nRBC 0.0 0.0 - 0.2 %   Neutrophils Relative % 88 %   Neutro Abs 10.1 (H) 1.7 - 7.7 K/uL   Lymphocytes Relative 7 %   Lymphs Abs 0.8 0.7 - 4.0 K/uL   Monocytes Relative 4 %   Monocytes Absolute 0.5 0.1 - 1.0 K/uL   Eosinophils Relative 0 %   Eosinophils Absolute 0.0 0.0 - 0.5 K/uL   Basophils Relative  0 %   Basophils Absolute 0.0 0.0 - 0.1 K/uL   Immature Granulocytes 1 %   Abs Immature Granulocytes 0.07 0.00 - 0.07 K/uL  Protime-INR  Result Value Ref Range   Prothrombin Time 13.6 11.4 - 15.2 seconds   INR 1.1 0.8 - 1.2  APTT  Result Value Ref Range   aPTT 27 24 - 36 seconds  Urinalysis, Routine w reflex microscopic  Result Value Ref Range   Color, Urine YELLOW YELLOW   APPearance HAZY (A) CLEAR   Specific Gravity, Urine 1.008 1.005 - 1.030   pH 5.0 5.0 - 8.0   Glucose, UA NEGATIVE  NEGATIVE mg/dL   Hgb urine dipstick SMALL (A) NEGATIVE   Bilirubin Urine NEGATIVE NEGATIVE   Ketones, ur 5 (A) NEGATIVE mg/dL   Protein, ur NEGATIVE NEGATIVE mg/dL   Nitrite NEGATIVE NEGATIVE   Leukocytes,Ua LARGE (A) NEGATIVE   RBC / HPF 0-5 0 - 5 RBC/hpf   WBC, UA >50 (H) 0 - 5 WBC/hpf   Bacteria, UA RARE (A) NONE SEEN   Squamous Epithelial / LPF 0-5 0 - 5   WBC Clumps PRESENT    Mucus PRESENT    Hyaline Casts, UA PRESENT   Sample to Blood Bank  Result Value Ref Range   Blood Bank Specimen SAMPLE AVAILABLE FOR TESTING    Sample Expiration      11/07/2019,2359 Performed at [facility], [place] [address] [place], [place] [address]    Laboratory interpretation all normal except hypokalemia, hyperglycemia, malnutrition, mild leukocytosis, possible UTI, urine culture sent.    EKG None   ED ECG REPORT   Date: 11/04/2019  Rate: 59  Rhythm: sinus bradycardia and premature atrial contractions (PAC)  QRS Axis: left  Intervals: normal  ST/T Wave abnormalities: normal  Conduction Disutrbances:right bundle branch block  Narrative Interpretation:   Old EKG Reviewed: none available  I have personally reviewed the EKG tracing and agree with the computerized printout as noted.    Radiology DG Ribs Unilateral W/Chest Right  Result Date: 11/04/2019 CLINICAL DATA:  Fall, right chest pain EXAM: RIGHT RIBS AND CHEST - 3+ VIEW COMPARISON:  07/18/2018 CT FINDINGS: No visible acute bony abnormality. No rib fracture. Heart is normal size. Bibasilar atelectasis. No effusions. IMPRESSION: No visible rib fracture. Bibasilar atelectasis. Electronically Signed   By: Rolm Baptise M.D.   On: 11/04/2019 02:52   DG Hip Unilat W or Wo Pelvis 2-3 Views Right  Result Date: 11/04/2019 CLINICAL DATA:  Fall out of bed.  Right hip pain EXAM: DG HIP (WITH OR WITHOUT PELVIS) 2-3V RIGHT COMPARISON:  04/10/2017 FINDINGS: There is a right femoral neck fracture. Varus angulation. No  subluxation or dislocation. Prior left hip replacement. IMPRESSION: Right femoral neck fracture with varus angulation. Electronically Signed   By: Rolm Baptise M.D.   On: 11/04/2019 02:51    Procedures Procedures (including critical care time)  Medications Ordered in ED Medications  potassium chloride 10 mEq in 100 mL IVPB (10 mEq Intravenous New Bag/Given 11/04/19 0442)  cefTRIAXone (ROCEPHIN) 1 g in sodium chloride 0.9 % 100 mL IVPB (0 g Intravenous Stopped 11/04/19 0353)    ED Course  I have reviewed the triage vital signs and the nursing notes.  Pertinent labs & imaging results that were available during my care of the patient were reviewed by me and considered in my medical decision making (see chart for details).    MDM Rules/Calculators/A&P  Patient had already been given pain medication by EMS.  She still a little bit sedated.  Laboratory testing was done, x-rays were ordered.  I suspect she does have a hip fracture.  Patient was given IV antibiotics for UTI.  After reviewing her labs she was given potassium 10 mEq runs x4.  4:37 AM Dr. Mardelle Matte, orthopedist on-call for Dr. Marcelino Scot, states to keep patient n.p.o. and they would do her later today.  He wants the hospitalist to admit of course.  5:00 AM Dr. Marlowe Sax, hospitalist will admit  Final Clinical Impression(s) / ED Diagnoses Final diagnoses:  Fall in home, initial encounter  Urinary tract infection without hematuria, site unspecified  Closed fracture of neck of right femur, initial encounter ([place])  Hypokalemia    Rx / DC Orders  Plan admission  Rolland Porter, MD, Barbette Or, MD 11/04/19 [id number]

## 2019-11-04 NOTE — Consult Note (Signed)
Reason for Consult:Right hip fx Referring Physician: Havery Moros  [name] is an 79 y.o. female.  HPI: [name] suffered what sounds like a syncopal event yesterday and fell. She had immediate pain in her right hip. She was brought to the ED where x-rays showed a femoral neck fx and orthopedic surgery was consulted. She c/o localized pain to the area. She answered my questions appropriately but was only Ox1 for the pre-op RN.  Past Medical History:  Diagnosis Date  . Alcohol abuse   . Anemia   . Arthritis   . Bacterial vaginosis   . Cancer ([place]) 01/2015   Breast cancer right   . Cerebrovascular accident (stroke) ([place]) 2/07   left frontal lobe  . Closed left hip fracture ([place]) 5/14  . Complication of anesthesia    woke up too early  with kidney stone surgery and wrist surgery  . Depression   . Dupuytren's contracture   . Fibrocystic breast disease   . Fracture of metatarsal bone of right foot    5th metatarsal  . GERD (gastroesophageal reflux disease)   . Gout   . Headache   . History of blood transfusion   . Hyperlipidemia LDL goal <70   . Hypertension   . Paroxysmal A-fib ([place])   . Renal calculus   . Vitamin B12 deficiency   . Vitamin D deficiency     Past Surgical History:  Procedure Laterality Date  . ABDOMINAL HYSTERECTOMY    . cataracts Bilateral   . COLONOSCOPY    . ESOPHAGEAL MANOMETRY N/A 08/15/2017   Procedure: ESOPHAGEAL MANOMETRY (EM);  Surgeon: Ronnette Juniper, MD;  Location: [facility];  Service: Gastroenterology;  Laterality: N/A;  . ESOPHAGOGASTRODUODENOSCOPY (EGD) WITH PROPOFOL N/A 05/02/2016   Procedure: ESOPHAGOGASTRODUODENOSCOPY (EGD) WITH PROPOFOL;  Surgeon: Garlan Fair, MD;  Location: [facility];  Service: Endoscopy;  Laterality: N/A;  . HIP ARTHROPLASTY Left 12/17/2012   Procedure: LEFT HIP HEMI ARTHROPLASTY ;  Surgeon: Rozanna Box, MD;  Location: [place];  Service: Orthopedics;  Laterality: Left;  . LAPAROSCOPIC APPENDECTOMY N/A 10/05/2017    Procedure: APPENDECTOMY LAPAROSCOPIC;  Surgeon: Greer Pickerel, MD;  Location: [facility];  Service: General;  Laterality: N/A;  . left wrist surgery surgery  7 yrs ago  . LEG SURGERY Left   . LITHOTRIPSY    . MASTECTOMY W/ SENTINEL NODE BIOPSY Bilateral 01/20/2016   Procedure: BILATERAL TOTAL MASTECTOMY WITH RIGHT SENTINEL LYMPH NODE BIOPSY;  Surgeon: Rolm Bookbinder, MD;  Location: [place];  Service: General;  Laterality: Bilateral;  . OPEN REDUCTION INTERNAL FIXATION (ORIF) DISTAL RADIAL FRACTURE Right 11/01/2018   Procedure: OPEN REDUCTION INTERNAL FIXATION (ORIF)  RIGHT DISTAL RADIAL FRACTURE;  Surgeon: Altamese Logan, MD;  Location: [place];  Service: Orthopedics;  Laterality: Right;    Family History  Problem Relation Age of Onset  . Stroke Mother   . Hypertension Father   . CAD Father     Social History:  reports that she has never smoked. Her smokeless tobacco use includes chew. She reports current alcohol use of about 21.0 standard drinks of alcohol per week. She reports that she does not use drugs.  Allergies:  Allergies  Allergen Reactions  . Vicodin [Hydrocodone-Acetaminophen]     UNSPECIFIED REACTION   . Ambien [Zolpidem Tartrate] Other (See Comments)    Causes nightmares    Medications: I have reviewed the patient's current medications.  Results for orders placed or performed during the hospital encounter of 11/04/19 (from the past  48 hour(s))  Urinalysis, Routine w reflex microscopic     Status: Abnormal   Collection Time: 11/04/19  1:52 AM  Result Value Ref Range   Color, Urine YELLOW YELLOW   APPearance HAZY (A) CLEAR   Specific Gravity, Urine 1.008 1.005 - 1.030   pH 5.0 5.0 - 8.0   Glucose, UA NEGATIVE NEGATIVE mg/dL   Hgb urine dipstick SMALL (A) NEGATIVE   Bilirubin Urine NEGATIVE NEGATIVE   Ketones, ur 5 (A) NEGATIVE mg/dL   Protein, ur NEGATIVE NEGATIVE mg/dL   Nitrite NEGATIVE NEGATIVE   Leukocytes,Ua LARGE (A) NEGATIVE   RBC / HPF 0-5 0 - 5 RBC/hpf   WBC,  UA >50 (H) 0 - 5 WBC/hpf   Bacteria, UA RARE (A) NONE SEEN   Squamous Epithelial / LPF 0-5 0 - 5   WBC Clumps PRESENT    Mucus PRESENT    Hyaline Casts, UA PRESENT     Comment: Performed at [facility], [place] [address], [place], [place] [address]  Comprehensive metabolic panel     Status: Abnormal   Collection Time: 11/04/19  3:30 AM  Result Value Ref Range   Sodium 136 135 - 145 mmol/L   Potassium 2.9 (L) 3.5 - 5.1 mmol/L   Chloride 99 98 - 111 mmol/L   CO2 22 22 - 32 mmol/L   Glucose, Bld 150 (H) 70 - 99 mg/dL    Comment: Glucose reference range applies only to samples taken after fasting for at least 8 hours.   BUN 8 8 - 23 mg/dL   Creatinine, Ser 0.85 0.44 - 1.00 mg/dL   Calcium 7.9 (L) 8.9 - 10.3 mg/dL   Total Protein 5.9 (L) 6.5 - 8.1 g/dL   Albumin 2.8 (L) 3.5 - 5.0 g/dL   AST 22 15 - 41 U/L   ALT 15 0 - 44 U/L   Alkaline Phosphatase 71 38 - 126 U/L   Total Bilirubin 0.8 0.3 - 1.2 mg/dL   GFR calc non Af Amer >60 >60 mL/min   GFR calc Af Amer >60 >60 mL/min   Anion gap 15 5 - 15    Comment: Performed at [facility], [place] [address] [place], [place] [address]  CBC with Differential     Status: Abnormal   Collection Time: 11/04/19  3:30 AM  Result Value Ref Range   WBC 11.6 (H) 4.0 - 10.5 K/uL   RBC 4.55 3.87 - 5.11 MIL/uL   Hemoglobin 14.4 12.0 - 15.0 g/dL   HCT 45.4 36.0 - 46.0 %   MCV 99.8 80.0 - 100.0 fL   MCH 31.6 26.0 - 34.0 pg   MCHC 31.7 30.0 - 36.0 g/dL   RDW 13.8 11.5 - 15.5 %   Platelets 162 150 - 400 K/uL   nRBC 0.0 0.0 - 0.2 %   Neutrophils Relative % 88 %   Neutro Abs 10.1 (H) 1.7 - 7.7 K/uL   Lymphocytes Relative 7 %   Lymphs Abs 0.8 0.7 - 4.0 K/uL   Monocytes Relative 4 %   Monocytes Absolute 0.5 0.1 - 1.0 K/uL   Eosinophils Relative 0 %   Eosinophils Absolute 0.0 0.0 - 0.5 K/uL   Basophils Relative 0 %   Basophils Absolute 0.0 0.0 - 0.1 K/uL   Immature Granulocytes 1 %   Abs Immature Granulocytes 0.07  0.00 - 0.07 K/uL    Comment: Performed at [facility], [place] [address] [place],  [address]  Protime-INR     Status: None   Collection Time: 11/04/19  3:30 AM  Result Value Ref Range   Prothrombin Time 13.6 11.4 - 15.2 seconds   INR 1.1 0.8 - 1.2    Comment: (NOTE) INR goal varies based on device and disease states. Performed at [facility], [place] [address] [place], [place] [address]   APTT     Status: None   Collection Time: 11/04/19  3:30 AM  Result Value Ref Range   aPTT 27 24 - 36 seconds    Comment: Performed at [facility], [place] [address], [place], [place] [address]  Sample to Blood Bank     Status: None   Collection Time: 11/04/19  3:30 AM  Result Value Ref Range   Blood Bank Specimen SAMPLE AVAILABLE FOR TESTING    Sample Expiration      11/07/2019,2359 Performed at [facility], [place] [address] [place], [place] [address]   Magnesium     Status: None   Collection Time: 11/04/19  3:30 AM  Result Value Ref Range   Magnesium 1.8 1.7 - 2.4 mg/dL    Comment: Performed at [facility], [place] [address], [place], [place] [address]  Phosphorus     Status: None   Collection Time: 11/04/19  3:30 AM  Result Value Ref Range   Phosphorus 3.3 2.5 - 4.6 mg/dL    Comment: Performed at [facility], [place] [address], [place], [place] [address]  D-dimer, quantitative (not at [facility])     Status: Abnormal   Collection Time: 11/04/19  3:30 AM  Result Value Ref Range   D-Dimer, Quant 2.48 (H) 0.00 - 0.50 ug/mL-FEU    Comment: (NOTE) At the manufacturer cut-off of 0.50 ug/mL FEU, this assay has been documented to exclude PE with a sensitivity and negative predictive value of 97 to 99%.  At this time, this assay has not been approved by the FDA to exclude DVT/VTE. Results should be correlated with clinical presentation. Performed at [facility], [place]  [address] [place], [place] [address]   Respiratory Panel by RT PCR (Flu A&B, Covid) - Nasopharyngeal Swab     Status: None   Collection Time: 11/04/19  4:44 AM   Specimen: Nasopharyngeal Swab  Result Value Ref Range   SARS Coronavirus 2 by RT PCR NEGATIVE NEGATIVE    Comment: (NOTE) SARS-CoV-2 target nucleic acids are NOT DETECTED. The SARS-CoV-2 RNA is generally detectable in upper respiratoy specimens during the acute phase of infection. The lowest concentration of SARS-CoV-2 viral copies this assay can detect is 131 copies/mL. A negative result does not preclude SARS-Cov-2 infection and should not be used as the sole basis for treatment or other patient management decisions. A negative result may occur with  improper specimen collection/handling, submission of specimen other than nasopharyngeal swab, presence of viral mutation(s) within the areas targeted by this assay, and inadequate number of viral copies (<131 copies/mL). A negative result must be combined with clinical observations, patient history, and epidemiological information. The expected result is Negative. Fact Sheet for Patients:  PinkCheek.be Fact Sheet for Healthcare Providers:  GravelBags.it This test is not yet ap proved or cleared by the [place] FDA and  has been authorized for detection and/or diagnosis of SARS-CoV-2 by FDA under an Emergency Use Authorization (EUA). This EUA will remain  in effect (meaning this test can be used) for the duration of the COVID-19 declaration under Section  564(b)(1) of the Act, 21 U.S.C. section 360bbb-3(b)(1), unless the authorization is terminated or revoked sooner.    Influenza A by PCR NEGATIVE NEGATIVE   Influenza B by PCR NEGATIVE NEGATIVE    Comment: (NOTE) The Xpert Xpress SARS-CoV-2/FLU/RSV assay is intended as an aid in  the diagnosis of influenza from Nasopharyngeal swab specimens and  should not be  used as a sole basis for treatment. Nasal washings and  aspirates are unacceptable for Xpert Xpress SARS-CoV-2/FLU/RSV  testing. Fact Sheet for Patients: PinkCheek.be Fact Sheet for Healthcare Providers: GravelBags.it This test is not yet approved or cleared by the [place] FDA and  has been authorized for detection and/or diagnosis of SARS-CoV-2 by  FDA under an Emergency Use Authorization (EUA). This EUA will remain  in effect (meaning this test can be used) for the duration of the  Covid-19 declaration under Section 564(b)(1) of the Act, 21  U.S.C. section 360bbb-3(b)(1), unless the authorization is  terminated or revoked. Performed at [facility], [place] [address] [place], [place] [address]     DG Ribs Unilateral W/Chest Right  Result Date: 11/04/2019 CLINICAL DATA:  Fall, right chest pain EXAM: RIGHT RIBS AND CHEST - 3+ VIEW COMPARISON:  07/18/2018 CT FINDINGS: No visible acute bony abnormality. No rib fracture. Heart is normal size. Bibasilar atelectasis. No effusions. IMPRESSION: No visible rib fracture. Bibasilar atelectasis. Electronically Signed   By: Rolm Baptise M.D.   On: 11/04/2019 02:52   CT HEAD WO CONTRAST  Result Date: 11/04/2019 CLINICAL DATA:  Penetrating head trauma.  Fall from bed. EXAM: CT HEAD WITHOUT CONTRAST TECHNIQUE: Contiguous axial images were obtained from the base of the skull through the vertex without intravenous contrast. COMPARISON:  04/10/2017 FINDINGS: Brain: No evidence of acute infarction, hemorrhage, hydrocephalus, extra-axial collection or mass lesion/mass effect. Remote small vessel infarct at the left corona radiata. Vascular: No hyperdense vessel or unexpected calcification. Skull: Normal. Negative for fracture or focal lesion. Sinuses/Orbits: No acute finding. IMPRESSION: No evidence of intracranial injury. Electronically Signed   By: Monte Fantasia M.D.   On:  11/04/2019 07:55   DG Hip Unilat W or Wo Pelvis 2-3 Views Right  Result Date: 11/04/2019 CLINICAL DATA:  Fall out of bed.  Right hip pain EXAM: DG HIP (WITH OR WITHOUT PELVIS) 2-3V RIGHT COMPARISON:  04/10/2017 FINDINGS: There is a right femoral neck fracture. Varus angulation. No subluxation or dislocation. Prior left hip replacement. IMPRESSION: Right femoral neck fracture with varus angulation. Electronically Signed   By: Rolm Baptise M.D.   On: 11/04/2019 02:51   VAS US CAROTID  Result Date: 11/04/2019 Carotid Arterial Duplex Study Indications:       Syncope. Limitations        Today's exam was limited due to the high bifurcation of the                    carotid, the patient's respiratory variation and patient                    somnolence, patient movement. Comparison Study:  No prior studies. Performing Technologist: Oliver Hum RVT  Examination Guidelines: A complete evaluation includes B-mode imaging, spectral Doppler, color Doppler, and power Doppler as needed of all accessible portions of each vessel. Bilateral testing is considered an integral part of a complete examination. Limited examinations for reoccurring indications may be performed as noted.  Right Carotid Findings: +----------+--------+--------+--------+-----------------------+--------------+  PSV cm/sEDV cm/sStenosisPlaque Description     Comments       +----------+--------+--------+--------+-----------------------+--------------+ CCA Prox  61      7               smooth and heterogenous               +----------+--------+--------+--------+-----------------------+--------------+ CCA Distal52      6               smooth and heterogenous               +----------+--------+--------+--------+-----------------------+--------------+ ICA Prox  27      6               smooth and heterogenous               +----------+--------+--------+--------+-----------------------+--------------+ ICA Distal                                                Not visualized +----------+--------+--------+--------+-----------------------+--------------+ ECA       158     11                                                    +----------+--------+--------+--------+-----------------------+--------------+ +----------+--------+-------+--------+-------------------+           PSV cm/sEDV cmsDescribeArm Pressure (mmHG) +----------+--------+-------+--------+-------------------+ [id number]                                         +----------+--------+-------+--------+-------------------+ +---------+--------+--+--------+-+---------+ VertebralPSV cm/s27EDV cm/s4Antegrade +---------+--------+--+--------+-+---------+  Left Carotid Findings: +----------+--------+--------+--------+-----------------------+--------+           PSV cm/sEDV cm/sStenosisPlaque Description     Comments +----------+--------+--------+--------+-----------------------+--------+ CCA Prox  62      12              smooth and heterogenous         +----------+--------+--------+--------+-----------------------+--------+ CCA Distal58      11              smooth and heterogenous         +----------+--------+--------+--------+-----------------------+--------+ ICA Prox  54      17              smooth and heterogenous         +----------+--------+--------+--------+-----------------------+--------+ ICA Distal65      21                                     tortuous +----------+--------+--------+--------+-----------------------+--------+ ECA       240     22                                              +----------+--------+--------+--------+-----------------------+--------+ +----------+--------+--------+--------+-------------------+           PSV cm/sEDV cm/sDescribeArm Pressure (mmHG) +----------+--------+--------+--------+-------------------+ Subclavian132                                          +----------+--------+--------+--------+-------------------+ +---------+--------+--+--------+-+---------+  VertebralPSV cm/s44EDV cm/s9Antegrade +---------+--------+--+--------+-+---------+   Summary: Right Carotid: Velocities in the right ICA are consistent with a 1-39% stenosis. Left Carotid: Velocities in the left ICA are consistent with a 1-39% stenosis. Vertebrals: Bilateral vertebral arteries demonstrate antegrade flow. *See table(s) above for measurements and observations.     Preliminary     Review of Systems  HENT: Negative for ear discharge, ear pain, hearing loss and tinnitus.   Eyes: Negative for photophobia and pain.  Respiratory: Negative for cough and shortness of breath.   Cardiovascular: Negative for chest pain.  Gastrointestinal: Negative for abdominal pain, nausea and vomiting.  Genitourinary: Negative for dysuria, flank pain, frequency and urgency.  Musculoskeletal: Positive for arthralgias (Right hip). Negative for back pain, myalgias and neck pain.  Neurological: Negative for dizziness and headaches.  Hematological: Does not bruise/bleed easily.  Psychiatric/Behavioral: The patient is not nervous/anxious.    Blood pressure (!) 161/67, pulse 62, temperature (!) 97.5 F (36.4 C), temperature source Oral, resp. rate 17, height 5\' 10"  (1.778 m), weight 55.3 kg, SpO2 98 %. Physical Exam  Constitutional: She appears well-developed and well-nourished. No distress.  HENT:  Head: Normocephalic and atraumatic.  Eyes: Conjunctivae are normal. Right eye exhibits no discharge. Left eye exhibits no discharge. No scleral icterus.  Cardiovascular: Normal rate and regular rhythm.  Respiratory: Effort normal. No respiratory distress.  Musculoskeletal:     Cervical back: Normal range of motion.     Comments: RLE No traumatic wounds, ecchymosis, or rash  Mild TTP hip  No knee or ankle effusion  Knee stable to varus/ valgus and anterior/posterior stress  Sens DPN, SPN, TN  intact  Motor EHL, ext, flex, evers 5/5  DP 1+, PT 0, No significant edema  Neurological: She is alert.  Skin: Skin is warm and dry. She is not diaphoretic.  Psychiatric: She has a normal mood and affect. Her behavior is normal.    Assessment/Plan: Right hip fx -- For hemiarthroplasty by Dr. Marcelino Scot later today. Please keep NPO. UTI Multiple medical problems including alcohol abuse, anemia, arthritis, breast cancer, CVA, depression, GERD, gout, hypertension, hyperlipidemia, and paroxysmal A. Fib not on anticoagulation -- per primary service    Lisette Abu, PA-C Orthopedic Surgery [phone] 11/04/2019, 12:21 PM

## 2019-11-04 NOTE — Progress Notes (Signed)
Carotid artery duplex has been completed. Preliminary results can be found in CV Proc through chart review.   11/04/19 11:19 AM [name] RVT

## 2019-11-04 NOTE — H&P (Addendum)
History and Physical    [name] [id number] DOB: Feb 01, 1941 DOA: 11/04/2019  PCP: Josetta Huddle, MD Patient coming from: Home  Chief Complaint: Fall, hip pain  HPI: [name] is a 79 y.o. female with medical history significant of alcohol abuse, anemia, arthritis, breast cancer, CVA, depression, GERD, gout, hypertension, hyperlipidemia, paroxysmal A. Fib presenting to the ED for evaluation of right hip pain secondary to a fall.  Patient states she was walking back from her bathroom and then her vision turned dark and she fell.  Since her fall she is having pain in her right hip.  Denies any headaches, neck pain, or back pain.  Denies chest pain, shortness of breath, fevers, chills, cough, nausea, vomiting, or abdominal pain.  Reports having one episode of diarrhea yesterday.  ED Course: Afebrile.  Not tachycardic, tachypneic, or hypotensive.  WBC count 11.6.  Potassium 2.9.  UA with large amount of leukocytes, greater than 50 WBCs, and rare bacteria.  Urine culture pending. X-ray showing right femoral neck fracture with varus angulation. Chest x-ray showing bibasilar atelectasis and no visible rib fracture. ED provider discussed the case with Dr. Mardelle Matte from orthopedics who recommended admission by hospitalist service.  Keep the patient n.p.o. and orthopedics will see the patient in the morning plan for surgery.  Patient received ceftriaxone and potassium supplementation.  Review of Systems:  All systems reviewed and apart from history of presenting illness, are negative.  Past Medical History:  Diagnosis Date  . Alcohol abuse   . Anemia   . Arthritis   . Bacterial vaginosis   . Cancer ([place]) 01/2015   Breast cancer right   . Cerebrovascular accident (stroke) ([place]) 2/07   left frontal lobe  . Closed left hip fracture ([place]) 5/14  . Complication of anesthesia    woke up too early  with kidney stone surgery and wrist surgery  . Depression   . Dupuytren's contracture    . Fibrocystic breast disease   . Fracture of metatarsal bone of right foot    5th metatarsal  . GERD (gastroesophageal reflux disease)   . Gout   . Headache   . History of blood transfusion   . Hyperlipidemia LDL goal <70   . Hypertension   . Paroxysmal A-fib ([place])   . Renal calculus   . Vitamin B12 deficiency   . Vitamin D deficiency     Past Surgical History:  Procedure Laterality Date  . ABDOMINAL HYSTERECTOMY    . cataracts Bilateral   . COLONOSCOPY    . ESOPHAGEAL MANOMETRY N/A 08/15/2017   Procedure: ESOPHAGEAL MANOMETRY (EM);  Surgeon: Ronnette Juniper, MD;  Location: [facility];  Service: Gastroenterology;  Laterality: N/A;  . ESOPHAGOGASTRODUODENOSCOPY (EGD) WITH PROPOFOL N/A 05/02/2016   Procedure: ESOPHAGOGASTRODUODENOSCOPY (EGD) WITH PROPOFOL;  Surgeon: Garlan Fair, MD;  Location: [facility];  Service: Endoscopy;  Laterality: N/A;  . HIP ARTHROPLASTY Left 12/17/2012   Procedure: LEFT HIP HEMI ARTHROPLASTY ;  Surgeon: Rozanna Box, MD;  Location: [facility];  Service: Orthopedics;  Laterality: Left;  . LAPAROSCOPIC APPENDECTOMY N/A 10/05/2017   Procedure: APPENDECTOMY LAPAROSCOPIC;  Surgeon: Greer Pickerel, MD;  Location: [facility];  Service: General;  Laterality: N/A;  . left wrist surgery surgery  7 yrs ago  . LEG SURGERY Left   . LITHOTRIPSY    . MASTECTOMY W/ SENTINEL NODE BIOPSY Bilateral 01/20/2016   Procedure: BILATERAL TOTAL MASTECTOMY WITH RIGHT SENTINEL LYMPH NODE BIOPSY;  Surgeon: Rolm Bookbinder, MD;  Location: [facility];  Service: General;  Laterality: Bilateral;  . OPEN REDUCTION INTERNAL FIXATION (ORIF) DISTAL RADIAL FRACTURE Right 11/01/2018   Procedure: OPEN REDUCTION INTERNAL FIXATION (ORIF)  RIGHT DISTAL RADIAL FRACTURE;  Surgeon: Altamese Turner, MD;  Location: [facility];  Service: Orthopedics;  Laterality: Right;     reports that she has never smoked. Her smokeless tobacco use includes chew. She reports current alcohol use of about 21.0 standard drinks of alcohol  per week. She reports that she does not use drugs.  Allergies  Allergen Reactions  . Vicodin [Hydrocodone-Acetaminophen]     UNSPECIFIED REACTION   . Ambien [Zolpidem Tartrate] Other (See Comments)    Causes nightmares    Family History  Problem Relation Age of Onset  . Stroke Mother   . Hypertension Father   . CAD Father     Prior to Admission medications   Medication Sig Start Date End Date Taking? Authorizing Provider  acetaminophen (TYLENOL) 325 MG tablet Take 325 mg by mouth every 6 (six) hours as needed for moderate pain.   Yes [provider]  amLODipine (NORVASC) 5 MG tablet Take 5 mg by mouth daily.   Yes [provider]  cholecalciferol (VITAMIN D3) 25 MCG (1000 UT) tablet Take 1,000 Units by mouth daily.   Yes [provider]  Cyanocobalamin (VITAMIN B-12 IJ) Inject 1 application as directed every 30 (thirty) days.   Yes [provider]  omeprazole (PRILOSEC) 20 MG capsule Take 1 capsule by mouth 2 (two) times daily as needed (heart burn).  10/24/18  Yes [provider]  vitamin B-12 (CYANOCOBALAMIN) 1000 MCG tablet Take 1,000 mcg by mouth daily.   Yes [provider]  albuterol (VENTOLIN HFA) 108 (90 Base) MCG/ACT inhaler Inhale 1-2 puffs into the lungs every 6 (six) hours as needed for wheezing or shortness of breath.    [provider]  metoprolol succinate (TOPROL-XL) 25 MG 24 hr tablet Take 25 mg by mouth daily.    [provider]  nitroGLYCERIN (NITROSTAT) 0.4 MG SL tablet Place 1 tablet under the tongue every 5 (five) minutes as needed for chest pain.  11/13/17   [provider]  oxyCODONE-acetaminophen (PERCOCET/ROXICET) 5-325 MG tablet Take 1 tablet by mouth every 6 (six) hours as needed for severe pain. Patient not taking: Reported on 11/04/2019 10/23/18   Hayden Rasmussen, MD    Physical Exam: Vitals:   11/04/19 0152 11/04/19 0200 11/04/19 0400 11/04/19 0515  BP:  106/65 (!) 94/53  96/64  Pulse:  60 (!) 58 63  Resp:  15 11 12   Temp:      TempSrc:      SpO2:  95% 100% 94%  Weight: 55.3 kg     Height: 5\' 10"  (1.778 m)       Physical Exam  Constitutional: She appears well-developed and well-nourished. No distress.  HENT:  Head: Normocephalic.  Eyes: Right eye exhibits no discharge. Left eye exhibits no discharge.  Cardiovascular: Normal rate, regular rhythm and intact distal pulses.  Pulmonary/Chest: Effort normal and breath sounds normal. No respiratory distress. She has no wheezes. She has no rales.  Abdominal: Soft. Bowel sounds are normal. She exhibits no distension. There is no abdominal tenderness. There is no guarding.  Musculoskeletal:        General: No edema.     Cervical back: Neck supple.     Comments: Right lower extremity shortened and externally rotated  Neurological: She is alert.  Skin: Skin is warm and dry. She is  not diaphoretic.     Labs on Admission: I have personally reviewed following labs and imaging studies  CBC: Recent Labs  Lab 11/04/19 0330  WBC 11.6*  NEUTROABS 10.1*  HGB 14.4  HCT 45.4  MCV 99.8  PLT 0000000   Basic Metabolic Panel: Recent Labs  Lab 11/04/19 0330  NA 136  K 2.9*  CL 99  CO2 22  GLUCOSE 150*  BUN 8  CREATININE 0.85  CALCIUM 7.9*   GFR: Estimated Creatinine Clearance: 46.9 mL/min (by C-G formula based on SCr of 0.85 mg/dL). Liver Function Tests: Recent Labs  Lab 11/04/19 0330  AST 22  ALT 15  ALKPHOS 71  BILITOT 0.8  PROT 5.9*  ALBUMIN 2.8*   No results for input(s): LIPASE, AMYLASE in the last 168 hours. No results for input(s): AMMONIA in the last 168 hours. Coagulation Profile: Recent Labs  Lab 11/04/19 0330  INR 1.1   Cardiac Enzymes: No results for input(s): CKTOTAL, CKMB, CKMBINDEX, TROPONINI in the last 168 hours. BNP (last 3 results) No results for input(s): PROBNP in the last 8760 hours. HbA1C: No results for input(s): HGBA1C in the last 72 hours. CBG: No results for  input(s): GLUCAP in the last 168 hours. Lipid Profile: No results for input(s): CHOL, HDL, LDLCALC, TRIG, CHOLHDL, LDLDIRECT in the last 72 hours. Thyroid Function Tests: No results for input(s): TSH, T4TOTAL, FREET4, T3FREE, THYROIDAB in the last 72 hours. Anemia Panel: No results for input(s): VITAMINB12, FOLATE, FERRITIN, TIBC, IRON, RETICCTPCT in the last 72 hours. Urine analysis:    Component Value Date/Time   COLORURINE YELLOW 11/04/2019 0152   APPEARANCEUR HAZY (A) 11/04/2019 0152   LABSPEC 1.008 11/04/2019 0152   PHURINE 5.0 11/04/2019 0152   GLUCOSEU NEGATIVE 11/04/2019 0152   HGBUR SMALL (A) 11/04/2019 0152   BILIRUBINUR NEGATIVE 11/04/2019 0152   KETONESUR 5 (A) 11/04/2019 0152   PROTEINUR NEGATIVE 11/04/2019 0152   UROBILINOGEN 0.2 09/11/2014 2309   NITRITE NEGATIVE 11/04/2019 0152   LEUKOCYTESUR LARGE (A) 11/04/2019 0152    Radiological Exams on Admission: DG Ribs Unilateral W/Chest Right  Result Date: 11/04/2019 CLINICAL DATA:  Fall, right chest pain EXAM: RIGHT RIBS AND CHEST - 3+ VIEW COMPARISON:  07/18/2018 CT FINDINGS: No visible acute bony abnormality. No rib fracture. Heart is normal size. Bibasilar atelectasis. No effusions. IMPRESSION: No visible rib fracture. Bibasilar atelectasis. Electronically Signed   By: Rolm Baptise M.D.   On: 11/04/2019 02:52   DG Hip Unilat W or Wo Pelvis 2-3 Views Right  Result Date: 11/04/2019 CLINICAL DATA:  Fall out of bed.  Right hip pain EXAM: DG HIP (WITH OR WITHOUT PELVIS) 2-3V RIGHT COMPARISON:  04/10/2017 FINDINGS: There is a right femoral neck fracture. Varus angulation. No subluxation or dislocation. Prior left hip replacement. IMPRESSION: Right femoral neck fracture with varus angulation. Electronically Signed   By: Rolm Baptise M.D.   On: 11/04/2019 02:51    EKG: Pending at this time.  Assessment/Plan Principal Problem:   Hip fracture (HCC) Active Problems:   Syncope   UTI (urinary tract infection)   Hypoxia    Hypokalemia   Acute right femoral neck fracture: Continue pain management with fentanyl as needed.  Nonweightbearing, keep n.p.o. orthopedics will consult in a.m.  Syncope: Check orthostatics, continue cardiac monitoring, order echocardiogram, EEG, and carotid Dopplers.  Check D-dimer level, if elevated, CT angiogram to rule out PE.  Hypoxia: Nursing staff reported that she became hypoxic with oxygen saturation down to the low 80s after receiving  fentanyl for pain by EMS and was not hypoxic previously.  Oxygen saturation now improved with 3 to 4 L supplemental oxygen.  Continue supplemental oxygen and continuous pulse ox.  Give opiates cautiously for hip pain.  UTI: Has mild leukocytosis and UA suggestive of infection.  No signs of sepsis.  Continue ceftriaxone.  Urine culture pending.  Hypokalemia: Suspect related to decreased p.o. intake.  Patient reports 1 episode of diarrhea yesterday, no additional episodes of diarrhea since she has been in the ED.  No diuretic listed in home medications.  Replete potassium, check magnesium level and replete if low.  Continue to monitor electrolytes.  Alcohol abuse: No signs of withdrawal at this time.  CIWA protocol; Ativan as needed.  Thiamine, folate, multivitamin.  Hypertension: Hold antihypertensives at this time.  Orthostatics pending.  DVT prophylaxis: Hold anticoagulation as head CT currently pending.  Hold SCDs as D-dimer pending. Code Status: Patient wishes to be full code. Family Communication: No family available at this time. Disposition Plan: Anticipate discharge after clinical improvement. Consults called: Orthopedics Admission status: It is my clinical opinion that admission to INPATIENT is reasonable and necessary because of the expectation that this patient will require hospital care that crosses at least 2 midnights to treat this condition based on the medical complexity of the problems presented.  Given the aforementioned information,  the predictability of an adverse outcome is felt to be significant.  The medical decision making on this patient was of high complexity and the patient is at high risk for clinical deterioration, therefore this is a level 3 visit  Shela Leff MD [facility]  If 7PM-7AM, please contact night-coverage [email or web address]  11/04/2019, 5:47 AM

## 2019-11-04 NOTE — Anesthesia Procedure Notes (Signed)
Procedure Name: Intubation Date/Time: 11/04/2019 3:28 PM Performed by: Kamea Dacosta T, CRNA Pre-anesthesia Checklist: Patient identified, Emergency Drugs available, Suction available and Patient being monitored Patient Re-evaluated:Patient Re-evaluated prior to induction Oxygen Delivery Method: Circle system utilized Preoxygenation: Pre-oxygenation with 100% oxygen Induction Type: IV induction Ventilation: Mask ventilation without difficulty Laryngoscope Size: Mac and 4 Grade View: Grade I Tube type: Oral Tube size: 7.5 mm Number of attempts: 1 Airway Equipment and Method: Stylet and Oral airway Placement Confirmation: ETT inserted through vocal cords under direct vision,  positive ETCO2 and breath sounds checked- equal and bilateral Secured at: 22 cm Tube secured with: Tape Dental Injury: Teeth and Oropharynx as per pre-operative assessment

## 2019-11-04 NOTE — ED Triage Notes (Signed)
Pt arrived via EMS from home. Pt fell while getting up to use the restroom, and is complaining of right hip pain and pain in her right ribs. Some shortening and rotation of the right leg was noted by EMS. Pt is on antibiotics for a kidney infection. Pt has no blood thinners

## 2019-11-04 NOTE — Progress Notes (Signed)
[name] is a 79 y.o. female with medical history significant of alcohol abuse, arthritis, breast cancer, CVA, depression, GERD, gout, hypertension, hyperlipidemia, paroxysmal A. Fib not on [place] presenting to [facility] ED from home for evaluation of right hip pain after a fall.  States she was walking back from her bathroom and her vision turned dark and she fell.  Since her fall she is having pain in her right hip.  Presented for further evaluation.   R hip fracture on image.  Ortho consulted and [facility] asked to admit.    Placed on CIWA protocol due to concern for impending alcohol withdrawal.  Syncope work up initiated.  On IV abx for presumptive UTI.  Hypokalemia with K+ 2.9 that was IV repleted.  Mg2+ 2.8.  Ordered repeat serum potassium and serum phosphorous.  NPO for planned surgery.  Will switch her fluid to avoid hypoglycemia in the setting of NPO.  CBGs q4H while NPO.  UA positive for pyuria.  Mild leukocytosis and neutrophilia.  Treated for presumed UTI.  Continue Rocephin empirically until UCX results.  If negative dc abx.   Ortho requested transfer to [facility] for R hip repair.    11/04/19:  Seen and examined in the ED.  Somnolent and minimally interactive.  Vital signs stable on monitor in the room.  Hg stable at 14, INR 1.1, plt count normal.  Type and screen.   Disposition:  Transfer to [facility] for R hip repair as requested by orthopedic surgery.

## 2019-11-04 NOTE — Progress Notes (Signed)
Report given to [facility] at this time 

## 2019-11-05 ENCOUNTER — Inpatient Hospital Stay (HOSPITAL_COMMUNITY): Payer: Medicare Other

## 2019-11-05 DIAGNOSIS — E876 Hypokalemia: Secondary | ICD-10-CM

## 2019-11-05 DIAGNOSIS — T148XXA Other injury of unspecified body region, initial encounter: Secondary | ICD-10-CM

## 2019-11-05 DIAGNOSIS — R55 Syncope and collapse: Secondary | ICD-10-CM

## 2019-11-05 LAB — GLUCOSE, CAPILLARY
Glucose-Capillary: 168 mg/dL — ABNORMAL HIGH (ref 70–99)
Glucose-Capillary: 151 mg/dL — ABNORMAL HIGH (ref 70–99)
Glucose-Capillary: 118 mg/dL — ABNORMAL HIGH (ref 70–99)
Glucose-Capillary: 156 mg/dL — ABNORMAL HIGH (ref 70–99)

## 2019-11-05 LAB — URINE CULTURE
Culture: >=100000 — AB
Special Requests: NORMAL

## 2019-11-05 LAB — CBC
HCT: 43.7 % (ref 36.0–46.0)
Hemoglobin: 14 g/dL (ref 12.0–15.0)
MCH: 31.7 pg (ref 26.0–34.0)
MCHC: 32 g/dL (ref 30.0–36.0)
MCV: 98.9 fL (ref 80.0–100.0)
Platelets: 147 10*3/uL — ABNORMAL LOW (ref 150–400)
RBC: 4.42 MIL/uL (ref 3.87–5.11)
RDW: 14 % (ref 11.5–15.5)
WBC: 9.1 10*3/uL (ref 4.0–10.5)
nRBC: 0 % (ref 0.0–0.2)

## 2019-11-05 LAB — BASIC METABOLIC PANEL
Anion gap: 9 (ref 5–15)
BUN: 6 mg/dL — ABNORMAL LOW (ref 8–23)
CO2: 22 mmol/L (ref 22–32)
Calcium: 8.1 mg/dL — ABNORMAL LOW (ref 8.9–10.3)
Chloride: 107 mmol/L (ref 98–111)
Creatinine, Ser: 0.91 mg/dL (ref 0.44–1.00)
GFR calc Af Amer: >60 mL/min (ref >60)
GFR calc non Af Amer: >60 mL/min (ref >60)
Glucose, Bld: 190 mg/dL — ABNORMAL HIGH (ref 70–99)
Potassium: 4 mmol/L (ref 3.5–5.1)
Sodium: 138 mmol/L (ref 135–145)

## 2019-11-05 LAB — POCT I-STAT, CHEM 8
BUN: 7 mg/dL — ABNORMAL LOW (ref 8–23)
Calcium, Ion: 1.15 mmol/L (ref 1.15–1.40)
Chloride: 99 mmol/L (ref 98–111)
Creatinine, Ser: 0.7 mg/dL (ref 0.44–1.00)
Glucose, Bld: 187 mg/dL — ABNORMAL HIGH (ref 70–99)
HCT: 44 % (ref 36.0–46.0)
Hemoglobin: 15 g/dL (ref 12.0–15.0)
Potassium: 3.5 mmol/L (ref 3.5–5.1)
Sodium: 134 mmol/L — ABNORMAL LOW (ref 135–145)
TCO2: 23 mmol/L (ref 22–32)

## 2019-11-05 LAB — ECHOCARDIOGRAM COMPLETE
Height: 70 in
Weight: 1950.63 oz

## 2019-11-05 LAB — VITAMIN D 25 HYDROXY (VIT D DEFICIENCY, FRACTURES): Vit D, 25-Hydroxy: 24.86 ng/mL — ABNORMAL LOW (ref 30–100)

## 2019-11-05 MED ORDER — CHLORHEXIDINE GLUCONATE CLOTH 2 % EX PADS
6.0000 | MEDICATED_PAD | Freq: Every day | CUTANEOUS | Status: DC
  Administered 2019-11-05 – 2019-11-09 (×4): 6 via TOPICAL

## 2019-11-05 MED ORDER — SODIUM CHLORIDE 0.9 % IV SOLN
1.0000 g | Freq: Four times a day (QID) | INTRAVENOUS | Status: DC
  Administered 2019-11-05 – 2019-11-07 (×8): 1 g via INTRAVENOUS
  Filled 2019-11-05 (×9): qty 1000

## 2019-11-05 NOTE — Plan of Care (Signed)

## 2019-11-05 NOTE — Progress Notes (Signed)
Physical Therapy Evaluation Patient Details Name: [name] MRN: [id number] DOB: Dec 17, 1940 Today's Date: 11/05/2019   History of Present Illness  [name] is a 79 y.o. female with medical history significant of alcohol abuse, anemia, arthritis, breast cancer, CVA, depression, GERD, gout, hypertension, hyperlipidemia, paroxysmal A. Fib presenting to the ED for evaluation of right hip pain secondary to a fall.   Clinical Impression  Pt recieved supine in bed willing to participate in treatment. Prior to admission pt was walking community distances without AD and cooking for herself but required help with stairs. Pt son and church friend were in room with her upon eval. She demonstrated LE weakness upon standing and stuggled with power up. During ambulation, noted tremulousness with RW-- could be related to possible withdrawal. Terminated walking due to fatigue by PTs request; pt forcefully sat with little to no muscle recruitment when recliner became available. Pt shared goal of "just wanting to get back to it." Pt states she would like to be d/c to a nursing home to recieve further rehab before returning home. PT agrees with this recommendation to improve overall functional mobility and safety with independence and ADL's.    Follow Up Recommendations SNF    Equipment Recommendations  Rolling walker with 5" wheels    Recommendations for Other Services       Precautions / Restrictions Precautions Precautions: Posterior Hip Precaution Booklet Issued: Yes (comment) Precaution Comments: reviewed posterior hip precautions with pt, cues during mobility Restrictions Weight Bearing Restrictions: Yes RLE Weight Bearing: Weight bearing as tolerated      Mobility  Bed Mobility Overal bed mobility: Needs Assistance Bed Mobility: Sit to Supine     Supine to sit: Min assist Sit to supine: Mod assist   General bed mobility comments: mod A with L LE onto bed  Transfers Overall  transfer level: Needs assistance Equipment used: Rolling walker (2 wheeled) Transfers: Sit to/from Stand Sit to Stand: Max assist;Mod assist         General transfer comment: cued patient during transfers of posterior hip precautions  Ambulation/Gait Ambulation/Gait assistance: Min guard Gait Distance (Feet): 50 Feet Assistive device: Rolling walker (2 wheeled) Gait Pattern/deviations: Step-through pattern;Decreased stride length;Trunk flexed     General Gait Details: during ambulation, pt UE began shaking rapidly causing walker to be unsteady; required cues to self-monitor; noted decreased strength affecting stability in R stance  Stairs            Wheelchair Mobility    Modified Rankin (Stroke Patients Only)       Balance Overall balance assessment: Needs assistance Sitting-balance support: Feet supported;No upper extremity supported Sitting balance-Leahy Scale: Good     Standing balance support: Bilateral upper extremity supported;During functional activity Standing balance-Leahy Scale: Poor                               Pertinent Vitals/Pain Pain Assessment: Faces Faces Pain Scale: Hurts little more Pain Descriptors / Indicators: Grimacing;Guarding;Discomfort Pain Intervention(s): Limited activity within patient's tolerance;Monitored during session;Repositioned;Patient requesting pain meds-RN notified    Home Living Family/patient expects to be discharged to:: Skilled nursing facility Living Arrangements: Children                    Prior Function Level of Independence: Independent         Comments: pt was able to ambulate independently, friend stated she needed assistance clinbing stairs  Hand Dominance   Dominant Hand: Right    Extremity/Trunk Assessment   Upper Extremity Assessment Upper Extremity Assessment: Generalized weakness    Lower Extremity Assessment Lower Extremity Assessment: grossly decreased AROM and  strength, limited by pain post hip fracture and surgical fixation       Communication   Communication: No difficulties  Cognition Arousal/Alertness: Awake/alert Behavior During Therapy: WFL for tasks assessed/performed Overall Cognitive Status: Within Functional Limits for tasks assessed                                 General Comments: Pt demonstrated slight confusion when asked too many questions; able to effectively communicate overall      General Comments      Exercises     Assessment/Plan    PT Assessment Patient needs continued PT services  PT Problem List Decreased strength;Decreased balance;Decreased mobility;Decreased knowledge of precautions;Decreased activity tolerance       PT Treatment Interventions Gait training;Therapeutic activities;Patient/family education; Therapeutic exercises; Balance training    PT Goals (Current goals can be found in the Care Plan section)  Acute Rehab PT Goals Patient Stated Goal: get back to walking around and cooking PT Goal Formulation: With patient Time For Goal Achievement: 11/18/19 Potential to Achieve Goals: Good    Frequency     Barriers to discharge        Co-evaluation               AM-PAC PT "6 Clicks" Mobility  Outcome Measure Help needed turning from your back to your side while in a flat bed without using bedrails?: A Little Help needed moving from lying on your back to sitting on the side of a flat bed without using bedrails?: A Lot Help needed moving to and from a bed to a chair (including a wheelchair)?: A Lot Help needed standing up from a chair using your arms (e.g., wheelchair or bedside chair)?: A Lot Help needed to walk in hospital room?: A Lot Help needed climbing 3-5 steps with a railing? : Total 6 Click Score: 12    End of Session Equipment Utilized During Treatment: Gait belt Activity Tolerance: Patient limited by fatigue;Patient tolerated treatment well Patient left: in  chair;with call bell/phone within reach;with family/visitor present   PT Visit Diagnosis: Muscle weakness (generalized) (M62.81);History of falling (Z91.81);Other abnormalities of gait and mobility (R26.89);Unsteadiness on feet (R26.81)    Time: [id number] PT Time Calculation (min) (ACUTE ONLY): 33 min   Charges:   PT Evaluation $PT Eval Moderate Complexity: 1 Mod PT Treatments $Gait Training: 8-22 mins        Elliot Gault, [place], [place] 11/05/2019, 2:15 PM

## 2019-11-05 NOTE — Progress Notes (Signed)
PROGRESS NOTE    [name]  [id number] DOB: 10/21/1940 DOA: 11/04/2019 PCP: Josetta Huddle, MD    Brief Narrative:  [id number] with hx ETOH abuse, arthirits, CVA, HTN, Afib presented with fall, resulting in R hip fracture. Orthopedic Surgery following and pt s/p surgery.   Assessment & Plan:   Principal Problem:   Hip fracture ([place]) Active Problems:   Syncope   UTI (urinary tract infection)   Hypoxia   Hypokalemia    Acute right femoral neck fracture:  -Orthopedic Surgery following -Pt now s/p surgery -Wt bearing as tolerated per Ortho.  -Therapy recs for SNF, pending bed placement  Syncope:  -Will check orthostatic vitals -D-dimer elevated. Will check LE dopplerss -Repeat d-dimer in AM. If higher, would then consider f/u CTA chest -Stable at present  Hypoxia:  -Nursing staff reported that she became hypoxic with oxygen saturation down to the low 80s after receiving fentanyl for pain by EMS and was not hypoxic previously.   -Oxygen saturation now improved to room air -Continue supplemental oxygen and continuous pulse ox.  Give opiates cautiously for hip pain.  Enterococcus UTI:  -Has mild leukocytosis and UA suggestive of infection.   -No signs of sepsis.  Continue ceftriaxone. -Urine cx pos for pan-sensitive enterococcus. Transition to ampicillin per sensitivities  Hypokalemia:  -Suspect related to decreased p.o. intake.   -Potassium normalized  Alcohol abuse:  -No signs of withdrawal at this time.   -Cont with CIWA protocol; Ativan as needed.   -Cont with thiamine, folate, multivitamin.  Hypertension:  -Hold antihypertensives at this time.   -Orthostatics still pending  DVT prophylaxis: Lovenox subq Code Status: Full Family Communication: Pt in room, family at bedside Disposition Plan: From home, pending SNF when bed available per SW and when cleared by Ortho  Consultants:   Orthopedic Surgery  Procedures:      Antimicrobials: Anti-infectives (From admission, onward)   Start     Dose/Rate Route Frequency Ordered Stop   11/04/19 2200  cefTRIAXone (ROCEPHIN) 1 g in sodium chloride 0.9 % 100 mL IVPB     1 g 200 mL/hr over 30 Minutes Intravenous Every 24 hours 11/04/19 0540     11/04/19 1430  cefTRIAXone (ROCEPHIN) 1 g in sodium chloride 0.9 % 100 mL IVPB  Status:  Discontinued     1 g 200 mL/hr over 30 Minutes Intravenous  Once 11/04/19 1425 11/04/19 2003   11/04/19 1416  ceFAZolin (ANCEF) 2-4 GM/100ML-% IVPB    Note to Pharmacy: Grace Blight   : cabinet override      11/04/19 1416 11/05/19 0229   11/04/19 0300  cefTRIAXone (ROCEPHIN) 1 g in sodium chloride 0.9 % 100 mL IVPB     1 g 200 mL/hr over 30 Minutes Intravenous  Once 11/04/19 0256 11/04/19 0353       Subjective: Without complaints. Reports ambulating in the hallway this AM. Eager to start therapy  Objective: Vitals:   11/05/19 0359 11/05/19 0807 11/05/19 0853 11/05/19 1443  BP: (!) 120/58 130/77  (!) 149/78  Pulse: 69 69  75  Resp: 15 16  17   Temp: 98.3 F (36.8 C) 98.2 F (36.8 C)  98.5 F (36.9 C)  TempSrc: Oral Oral  Oral  SpO2: 96% 96% 93% 91%  Weight:      Height:        Intake/Output Summary (Last 24 hours) at 11/05/2019 1551 Last data filed at 11/05/2019 1500 Gross per 24 hour  Intake 3541.96 ml  Output 1350 ml  Net 2191.96 ml   Filed Weights   11/04/19 0152 11/04/19 1213  Weight: 55.3 kg 55.3 kg    Examination:  General exam: Appears calm and comfortable  Respiratory system: Clear to auscultation. Respiratory effort normal. Cardiovascular system: S1 & S2 heard, Regular Gastrointestinal system: Abdomen is nondistended, soft and nontender. No organomegaly or masses felt. Normal bowel sounds heard. Central nervous system: Alert and oriented. No focal neurological deficits. Extremities: Symmetric 5 x 5 power. Skin: No rashes, lesions Psychiatry: Judgement and insight appear normal. Mood & affect  appropriate.   Data Reviewed: I have personally reviewed following labs and imaging studies  CBC: Recent Labs  Lab 11/04/19 0330 11/04/19 1634 11/05/19 0547  WBC 11.6*  --  9.1  NEUTROABS 10.1*  --   --   HGB 14.4 15.0 14.0  HCT 45.4 44.0 43.7  MCV 99.8  --  98.9  PLT 162  --  [id number]*   Basic Metabolic Panel: Recent Labs  Lab 11/04/19 0330 11/04/19 1352 11/04/19 1634 11/05/19 0547  NA 136 132* 134* 138  K 2.9* 4.1 3.5 4.0  CL 99 103 99 107  CO2 22 11*  --  22  GLUCOSE 150* 67* 187* 190*  BUN 8 5* 7* 6*  CREATININE 0.85 <0.30* 0.70 0.91  CALCIUM 7.9* 6.5*  --  8.1*  MG 1.8  --   --   --   PHOS 3.3  --   --   --    GFR: Estimated Creatinine Clearance: 43.8 mL/min (by C-G formula based on SCr of 0.91 mg/dL). Liver Function Tests: Recent Labs  Lab 11/04/19 0330  AST 22  ALT 15  ALKPHOS 71  BILITOT 0.8  PROT 5.9*  ALBUMIN 2.8*   No results for input(s): LIPASE, AMYLASE in the last 168 hours. No results for input(s): AMMONIA in the last 168 hours. Coagulation Profile: Recent Labs  Lab 11/04/19 0330  INR 1.1   Cardiac Enzymes: No results for input(s): CKTOTAL, CKMB, CKMBINDEX, TROPONINI in the last 168 hours. BNP (last 3 results) No results for input(s): PROBNP in the last 8760 hours. HbA1C: No results for input(s): HGBA1C in the last 72 hours. CBG: Recent Labs  Lab 11/04/19 1807 11/05/19 0646 11/05/19 1143  GLUCAP 139* 168* 151*   Lipid Profile: No results for input(s): CHOL, HDL, LDLCALC, TRIG, CHOLHDL, LDLDIRECT in the last 72 hours. Thyroid Function Tests: No results for input(s): TSH, T4TOTAL, FREET4, T3FREE, THYROIDAB in the last 72 hours. Anemia Panel: No results for input(s): VITAMINB12, FOLATE, FERRITIN, TIBC, IRON, RETICCTPCT in the last 72 hours. Sepsis Labs: No results for input(s): PROCALCITON, LATICACIDVEN in the last 168 hours.  Recent Results (from the past 240 hour(s))  Urine culture     Status: Abnormal   Collection Time:  11/04/19  2:56 AM   Specimen: Urine, Catheterized  Result Value Ref Range Status   Specimen Description   Final    URINE, CATHETERIZED Performed at [facility] [address] [place], [place] [address]    Special Requests   Final    Normal Performed at [facility], [place] [address], [place], [place] [address]    Culture >=100,000 COLONIES/mL ENTEROCOCCUS FAECALIS (A)  Final   Report Status 11/05/2019 FINAL  Final   Organism ID, Bacteria ENTEROCOCCUS FAECALIS (A)  Final      Susceptibility   Enterococcus faecalis - MIC*    AMPICILLIN <=2 SENSITIVE Sensitive     NITROFURANTOIN <=16 SENSITIVE Sensitive  VANCOMYCIN 1 SENSITIVE Sensitive     * >=100,000 COLONIES/mL ENTEROCOCCUS FAECALIS  Respiratory Panel by RT PCR (Flu A&B, Covid) - Nasopharyngeal Swab     Status: None   Collection Time: 11/04/19  4:44 AM   Specimen: Nasopharyngeal Swab  Result Value Ref Range Status   SARS Coronavirus 2 by RT PCR NEGATIVE NEGATIVE Final    Comment: (NOTE) SARS-CoV-2 target nucleic acids are NOT DETECTED. The SARS-CoV-2 RNA is generally detectable in upper respiratoy specimens during the acute phase of infection. The lowest concentration of SARS-CoV-2 viral copies this assay can detect is 131 copies/mL. A negative result does not preclude SARS-Cov-2 infection and should not be used as the sole basis for treatment or other patient management decisions. A negative result may occur with  improper specimen collection/handling, submission of specimen other than nasopharyngeal swab, presence of viral mutation(s) within the areas targeted by this assay, and inadequate number of viral copies (<131 copies/mL). A negative result must be combined with clinical observations, patient history, and epidemiological information. The expected result is Negative. Fact Sheet for Patients:  PinkCheek.be Fact Sheet for Healthcare Providers:   GravelBags.it This test is not yet ap proved or cleared by the [place] FDA and  has been authorized for detection and/or diagnosis of SARS-CoV-2 by FDA under an Emergency Use Authorization (EUA). This EUA will remain  in effect (meaning this test can be used) for the duration of the COVID-19 declaration under Section 564(b)(1) of the Act, 21 U.S.C. section 360bbb-3(b)(1), unless the authorization is terminated or revoked sooner.    Influenza A by PCR NEGATIVE NEGATIVE Final   Influenza B by PCR NEGATIVE NEGATIVE Final    Comment: (NOTE) The Xpert Xpress SARS-CoV-2/FLU/RSV assay is intended as an aid in  the diagnosis of influenza from Nasopharyngeal swab specimens and  should not be used as a sole basis for treatment. Nasal washings and  aspirates are unacceptable for Xpert Xpress SARS-CoV-2/FLU/RSV  testing. Fact Sheet for Patients: PinkCheek.be Fact Sheet for Healthcare Providers: GravelBags.it This test is not yet approved or cleared by the [place] FDA and  has been authorized for detection and/or diagnosis of SARS-CoV-2 by  FDA under an Emergency Use Authorization (EUA). This EUA will remain  in effect (meaning this test can be used) for the duration of the  Covid-19 declaration under Section 564(b)(1) of the Act, 21  U.S.C. section 360bbb-3(b)(1), unless the authorization is  terminated or revoked. Performed at [facility], [place] [address] [place], [place] [address]   Surgical pcr screen     Status: None   Collection Time: 11/04/19  8:46 AM   Specimen: Nasal Mucosa; Nasal Swab  Result Value Ref Range Status   MRSA, PCR NEGATIVE NEGATIVE Final   Staphylococcus aureus NEGATIVE NEGATIVE Final    Comment: (NOTE) The Xpert SA Assay (FDA approved for NASAL specimens in patients 39 years of age and older), is one component of a comprehensive surveillance  program. It is not intended to diagnose infection nor to guide or monitor treatment. Performed at [facility], [place] [address], [place], [place] [address]      Radiology Studies: DG Ribs Unilateral W/Chest Right  Result Date: 11/04/2019 CLINICAL DATA:  Fall, right chest pain EXAM: RIGHT RIBS AND CHEST - 3+ VIEW COMPARISON:  07/18/2018 CT FINDINGS: No visible acute bony abnormality. No rib fracture. Heart is normal size. Bibasilar atelectasis. No effusions. IMPRESSION: No visible rib fracture. Bibasilar atelectasis. Electronically Signed   By:  Rolm Baptise M.D.   On: 11/04/2019 02:52   CT HEAD WO CONTRAST  Result Date: 11/04/2019 CLINICAL DATA:  Penetrating head trauma.  Fall from bed. EXAM: CT HEAD WITHOUT CONTRAST TECHNIQUE: Contiguous axial images were obtained from the base of the skull through the vertex without intravenous contrast. COMPARISON:  04/10/2017 FINDINGS: Brain: No evidence of acute infarction, hemorrhage, hydrocephalus, extra-axial collection or mass lesion/mass effect. Remote small vessel infarct at the left corona radiata. Vascular: No hyperdense vessel or unexpected calcification. Skull: Normal. Negative for fracture or focal lesion. Sinuses/Orbits: No acute finding. IMPRESSION: No evidence of intracranial injury. Electronically Signed   By: Monte Fantasia M.D.   On: 11/04/2019 07:55   ECHOCARDIOGRAM COMPLETE  Result Date: 11/05/2019    ECHOCARDIOGRAM REPORT   Patient Name:   [name] Date of Exam: 11/05/2019 Medical Rec #:  [id number]        Height:       70.0 in Accession #:    [id number]       Weight:       121.9 lb Date of Birth:  [birth year]       BSA:          1.691 m Patient Age:    60 years         BP:           130/77 mmHg Patient Gender: F                HR:           77 bpm. Exam Location:  Inpatient Procedure: 2D Echo, Cardiac Doppler and Color Doppler Indications:    R55 Syncope  History:        Patient has prior history of Echocardiogram examinations,  most                 recent 10/29/2018. Stroke, Arrythmias:Atrial Fibrillation,                 Signs/Symptoms:Syncope and Chest Pain; Risk                 Factors:Dyslipidemia. Hypoxia. ETOH. History of breast cancer.  Sonographer:    Roseanna Rainbow RDCS Referring Phys: [place]  Sonographer Comments: Technically difficult study due to poor echo windows. Restricted mobility. IMPRESSIONS  1. Left ventricular ejection fraction, by estimation, is 55 to 60%. The left ventricle has normal function. The left ventricle has no regional wall motion abnormalities. Left ventricular diastolic parameters were normal.  2. Right ventricular systolic function is normal. The right ventricular size is normal. There is moderately elevated pulmonary artery systolic pressure.  3. The mitral valve is normal in structure. Trivial mitral valve regurgitation. No evidence of mitral stenosis.  4. Tricuspid valve regurgitation is moderate.  5. The aortic valve is tricuspid. Aortic valve regurgitation is not visualized. Mild to moderate aortic valve sclerosis/calcification is present, without any evidence of aortic stenosis.  6. March 2020 aortic root measured 3.9 cm.  7. The inferior vena cava is normal in size with greater than 50% respiratory variability, suggesting right atrial pressure of 3 mmHg. FINDINGS  Left Ventricle: Left ventricular ejection fraction, by estimation, is 55 to 60%. The left ventricle has normal function. The left ventricle has no regional wall motion abnormalities. The left ventricular internal cavity size was normal in size. There is  no left ventricular hypertrophy. Left ventricular diastolic parameters were normal. Right Ventricle: The right ventricular size is normal. No increase in right ventricular  wall thickness. Right ventricular systolic function is normal. There is moderately elevated pulmonary artery systolic pressure. The tricuspid regurgitant velocity is 3.08 m/s, and with an assumed right atrial  pressure of 15 mmHg, the estimated right ventricular systolic pressure is [id number] mmHg. Left Atrium: Left atrial size was normal in size. Right Atrium: Right atrial size was normal in size. Pericardium: There is no evidence of pericardial effusion. Mitral Valve: The mitral valve is normal in structure. There is mild thickening of the mitral valve leaflet(s). There is mild calcification of the mitral valve leaflet(s). Normal mobility of the mitral valve leaflets. Trivial mitral valve regurgitation. No evidence of mitral valve stenosis. Tricuspid Valve: The tricuspid valve is normal in structure. Tricuspid valve regurgitation is moderate . No evidence of tricuspid stenosis. Aortic Valve: The aortic valve is tricuspid. . There is moderate thickening and moderate calcification of the aortic valve. Aortic valve regurgitation is not visualized. Mild to moderate aortic valve sclerosis/calcification is present, without any evidence of aortic stenosis. There is moderate thickening of the aortic valve. There is moderate calcification of the aortic valve. Pulmonic Valve: The pulmonic valve was normal in structure. Pulmonic valve regurgitation is trivial. No evidence of pulmonic stenosis. Aorta: March 2020 aortic root measured 3.9 cm. The aortic root is normal in size and structure. Venous: The inferior vena cava is normal in size with greater than 50% respiratory variability, suggesting right atrial pressure of 3 mmHg. IAS/Shunts: No atrial level shunt detected by color flow Doppler.  LEFT VENTRICLE PLAX 2D LVOT diam:     2.20 cm     Diastology LV SV:         71          LV e' lateral:   8.59 cm/s LV SV Index:   42          LV E/e' lateral: 9.8 LVOT Area:     3.80 cm    LV e' medial:    4.24 cm/s                            LV E/e' medial:  19.8  LV Volumes (MOD) LV vol d, MOD A2C: 44.5 ml LV vol d, MOD A4C: 48.8 ml LV vol s, MOD A2C: 11.3 ml LV vol s, MOD A4C: 11.6 ml LV SV MOD A2C:     33.2 ml LV SV MOD A4C:     48.8 ml LV SV  MOD BP:      34.5 ml RIGHT VENTRICLE            IVC RV S prime:     6.85 cm/s  IVC diam: 1.54 cm TAPSE (M-mode): 1.6 cm LEFT ATRIUM             Index       RIGHT ATRIUM           Index LA Vol (A2C):   19.9 ml 11.77 ml/m RA Area:     13.20 cm LA Vol (A4C):   18.5 ml 10.94 ml/m RA Volume:   31.80 ml  18.80 ml/m LA Biplane Vol: 20.6 ml 12.18 ml/m  AORTIC VALVE LVOT Vmax:   95.80 cm/s LVOT Vmean:  67.900 cm/s LVOT VTI:    0.188 m  AORTA Ao Asc diam: 3.70 cm MITRAL VALVE                TRICUSPID VALVE MV Area (PHT): 2.66 cm     TR  Peak grad:   37.9 mmHg MV Decel Time: 285 msec     TR Vmax:        308.00 cm/s MV E velocity: 83.90 cm/s MV A velocity: 113.00 cm/s  SHUNTS MV E/A ratio:  0.74         Systemic VTI:  0.19 m                             Systemic Diam: 2.20 cm Jenkins Rouge MD Electronically signed by Jenkins Rouge MD Signature Date/Time: 11/05/2019/11:48:29 AM    Final    DG Hip Port Unilat With Pelvis 1V Right  Result Date: 11/04/2019 CLINICAL DATA:  Status post right total hip replacement EXAM: DG HIP (WITH OR WITHOUT PELVIS) 2V PORT RIGHT COMPARISON:  Preoperative pelvis and right hip images November 04, 2019 FINDINGS: Frontal pelvis as well as lateral right hip images obtained. There is a new total hip replacement on the right with prosthetic components well-seated. Prior total hip replacement on the left with prosthetic components well-seated as well. No fracture or dislocation evident postoperative. Sacroiliac joints appear normal bilaterally. IMPRESSION: New total hip replacement right with prosthetic components well-seated. Total hip replacement on the left with prosthetic components well-seated. No acute fracture or dislocation evident. Electronically Signed   By: Lowella Grip III M.D.   On: 11/04/2019 19:31   DG Hip Unilat W or Wo Pelvis 2-3 Views Right  Result Date: 11/04/2019 CLINICAL DATA:  Fall out of bed.  Right hip pain EXAM: DG HIP (WITH OR WITHOUT PELVIS) 2-3V RIGHT COMPARISON:   04/10/2017 FINDINGS: There is a right femoral neck fracture. Varus angulation. No subluxation or dislocation. Prior left hip replacement. IMPRESSION: Right femoral neck fracture with varus angulation. Electronically Signed   By: Rolm Baptise M.D.   On: 11/04/2019 02:51   VAS US CAROTID  Result Date: 11/04/2019 Carotid Arterial Duplex Study Indications:       Syncope. Limitations        Today's exam was limited due to the high bifurcation of the                    carotid, the patient's respiratory variation and patient                    somnolence, patient movement. Comparison Study:  No prior studies. Performing Technologist: Oliver Hum RVT  Examination Guidelines: A complete evaluation includes B-mode imaging, spectral Doppler, color Doppler, and power Doppler as needed of all accessible portions of each vessel. Bilateral testing is considered an integral part of a complete examination. Limited examinations for reoccurring indications may be performed as noted.  Right Carotid Findings: +----------+--------+--------+--------+-----------------------+--------------+           PSV cm/sEDV cm/sStenosisPlaque Description     Comments       +----------+--------+--------+--------+-----------------------+--------------+ CCA Prox  61      7               smooth and heterogenous               +----------+--------+--------+--------+-----------------------+--------------+ CCA Distal52      6               smooth and heterogenous               +----------+--------+--------+--------+-----------------------+--------------+ ICA Prox  27      6  smooth and heterogenous               +----------+--------+--------+--------+-----------------------+--------------+ ICA Distal                                               Not visualized +----------+--------+--------+--------+-----------------------+--------------+ ECA       158     11                                                     +----------+--------+--------+--------+-----------------------+--------------+ +----------+--------+-------+--------+-------------------+           PSV cm/sEDV cmsDescribeArm Pressure (mmHG) +----------+--------+-------+--------+-------------------+ [id number]                                         +----------+--------+-------+--------+-------------------+ +---------+--------+--+--------+-+---------+ VertebralPSV cm/s27EDV cm/s4Antegrade +---------+--------+--+--------+-+---------+  Left Carotid Findings: +----------+--------+--------+--------+-----------------------+--------+           PSV cm/sEDV cm/sStenosisPlaque Description     Comments +----------+--------+--------+--------+-----------------------+--------+ CCA Prox  62      12              smooth and heterogenous         +----------+--------+--------+--------+-----------------------+--------+ CCA Distal58      11              smooth and heterogenous         +----------+--------+--------+--------+-----------------------+--------+ ICA Prox  54      17              smooth and heterogenous         +----------+--------+--------+--------+-----------------------+--------+ ICA Distal65      21                                     tortuous +----------+--------+--------+--------+-----------------------+--------+ ECA       240     22                                              +----------+--------+--------+--------+-----------------------+--------+ +----------+--------+--------+--------+-------------------+           PSV cm/sEDV cm/sDescribeArm Pressure (mmHG) +----------+--------+--------+--------+-------------------+ Subclavian132                                         +----------+--------+--------+--------+-------------------+ +---------+--------+--+--------+-+---------+ VertebralPSV cm/s44EDV cm/s9Antegrade +---------+--------+--+--------+-+---------+   Summary: Right  Carotid: Velocities in the right ICA are consistent with a 1-39% stenosis. Left Carotid: Velocities in the left ICA are consistent with a 1-39% stenosis. Vertebrals: Bilateral vertebral arteries demonstrate antegrade flow. *See table(s) above for measurements and observations.  Electronically signed by Deitra Mayo MD on 11/04/2019 at 1:08:46 PM.    Final     Scheduled Meds: . vitamin C  500 mg Oral Daily  . Chlorhexidine Gluconate Cloth  6 each Topical Daily  . cholecalciferol  2,000 Units Oral BID  . docusate sodium  100 mg Oral BID  . enoxaparin (  LOVENOX) injection  40 mg Subcutaneous Q24H  . folic acid  1 mg Oral Daily  . multivitamin with minerals  1 tablet Oral Daily  . pantoprazole  40 mg Oral Daily  . polyethylene glycol  17 g Oral Daily  . thiamine  100 mg Oral Daily  . traMADol  50 mg Oral Q12H   Continuous Infusions: . cefTRIAXone (ROCEPHIN)  IV 1 g (11/04/19 2137)  . dextrose 5% lactated ringers 75 mL/hr at 11/05/19 1400     LOS: 1 day   Marylu Lund, MD [facility] Pager On Amion  If 7PM-7AM, please contact night-coverage 11/05/2019, 3:51 PM

## 2019-11-05 NOTE — Progress Notes (Signed)
  Echocardiogram 2D Echocardiogram has been performed.  [name] 11/05/2019, 10:47 AM

## 2019-11-05 NOTE — Plan of Care (Signed)

## 2019-11-05 NOTE — Progress Notes (Signed)
Foley removed per MD. Patient has voided since removal see flow sheet( Intake/Output).   Ice applied to R hip surgical site. Noted scant drainage on Aquacel drsg., Abduction foam pillow in place. Awaiting SCD pumps from [employer].

## 2019-11-05 NOTE — Evaluation (Signed)
Occupational Therapy Evaluation Patient Details Name: [name] MRN: [id number] DOB: 17-Mar-1941 Today's Date: 11/05/2019    History of Present Illness [name] is a 79 y.o. female with medical history significant of alcohol abuse, anemia, arthritis, breast cancer, CVA, depression, GERD, gout, hypertension, hyperlipidemia, paroxysmal A. Fib presenting to the ED for evaluation of right hip pain secondary to a fall.    Clinical Impression   Pt with decline in function and safety with ADLs and ADL mobility with impaired strength, balance and endurance. Pt lives at home alone in [place], but was visiting her son here locally when she fell. Pt reports that she was independent with ADLs/selfcare, home mgt and mobility. Pt currently requires min guard A with grooming/hygiene and UB ADLs seated, ma x- total with LB ADLs, total A with toileting and max - mod A with sit - stand/SPTs. Pt would benefit from acute OT services to address impairments to maximize level of function and safety.     Follow Up Recommendations  SNF    Equipment Recommendations  Other (comment)(TBD at next venue of care)    Recommendations for Other Services       Precautions / Restrictions Precautions Precautions: Posterior Hip Precaution Booklet Issued: Yes (comment) Precaution Comments: reviewed posterior hip precautions with pt, cues during mobility Restrictions Weight Bearing Restrictions: Yes RLE Weight Bearing: Weight bearing as tolerated      Mobility Bed Mobility Overal bed mobility: Needs Assistance Bed Mobility: Sit to Supine     Supine to sit: Min assist Sit to supine: Mod assist   General bed mobility comments: mod A with L LE onto bed  Transfers Overall transfer level: Needs assistance Equipment used: Rolling walker (2 wheeled) Transfers: Sit to/from Stand Sit to Stand: Max assist;Mod assist         General transfer comment: cued patient during transfers of posterior hip  precautions    Balance Overall balance assessment: Needs assistance Sitting-balance support: Feet supported;No upper extremity supported Sitting balance-Leahy Scale: Good     Standing balance support: Bilateral upper extremity supported;During functional activity Standing balance-Leahy Scale: Poor                             ADL either performed or assessed with clinical judgement   ADL Overall ADL's : Needs assistance/impaired Eating/Feeding: Set up;Independent;Sitting   Grooming: Wash/dry hands;Wash/dry face;Sitting;Min guard Grooming Details (indicate cue type and reason): sitting on BSC Upper Body Bathing: Min guard;Sitting Upper Body Bathing Details (indicate cue type and reason): simulated Lower Body Bathing: Maximal assistance   Upper Body Dressing : Min guard;Sitting   Lower Body Dressing: Total assistance   Toilet Transfer: Maximal assistance;Moderate assistance;RW;Cueing for safety;Cueing for sequencing;Stand-pivot   Toileting- Clothing Manipulation and Hygiene: Total assistance       Functional mobility during ADLs: Maximal assistance;Moderate assistance;Rolling walker;Cueing for safety;Cueing for sequencing       Vision Baseline Vision/History: Wears glasses Patient Visual Report: No change from baseline       Perception     Praxis      Pertinent Vitals/Pain Pain Assessment: Faces Faces Pain Scale: Hurts little more Pain Descriptors / Indicators: Grimacing;Guarding;Discomfort Pain Intervention(s): Limited activity within patient's tolerance;Monitored during session;Repositioned;Patient requesting pain meds-RN notified     Hand Dominance Right   Extremity/Trunk Assessment Upper Extremity Assessment Upper Extremity Assessment: Generalized weakness   Lower Extremity Assessment Lower Extremity Assessment: Defer to PT evaluation  Communication Communication Communication: No difficulties   Cognition Arousal/Alertness:  Awake/alert Behavior During Therapy: WFL for tasks assessed/performed Overall Cognitive Status: Within Functional Limits for tasks assessed                                 General Comments: Pt demonstrated slight confusion when asked too many questions; able to effectively communicate overall   General Comments       Exercises     Shoulder Instructions      Home Living Family/patient expects to be discharged to:: Skilled nursing facility Living Arrangements: Children                                      Prior Functioning/Environment Level of Independence: Independent        Comments: pt was able to ambulate independently, friend stated she needed assistance clinbing stairs        OT Problem List: Decreased strength;Impaired balance (sitting and/or standing);Decreased knowledge of precautions;Pain;Decreased activity tolerance;Decreased knowledge of use of DME or AE;Decreased safety awareness      OT Treatment/Interventions: Self-care/ADL training;DME and/or AE instruction;Therapeutic activities;Balance training;Therapeutic exercise;Patient/family education    OT Goals(Current goals can be found in the care plan section) Acute Rehab OT Goals Patient Stated Goal: get back to walking around and cooking OT Goal Formulation: With patient Time For Goal Achievement: 11/19/19 ADL Goals Pt Will Perform Grooming: with supervision;with set-up;sitting Pt Will Perform Upper Body Bathing: with supervision;with set-up;sitting Pt Will Perform Lower Body Bathing: with mod assist;sitting/lateral leans;sit to/from stand Pt Will Perform Upper Body Dressing: with supervision;with set-up;sitting Pt Will Transfer to Toilet: with mod assist;with min assist;ambulating;stand pivot transfer;bedside commode  OT Frequency: Min 2X/week   Barriers to D/C: Decreased caregiver support          Co-evaluation              AM-PAC OT "6 Clicks" Daily Activity      Outcome Measure Help from another person eating meals?: None Help from another person taking care of personal grooming?: A Little Help from another person toileting, which includes using toliet, bedpan, or urinal?: Total Help from another person bathing (including washing, rinsing, drying)?: A Lot Help from another person to put on and taking off regular upper body clothing?: A Little Help from another person to put on and taking off regular lower body clothing?: Total 6 Click Score: 14   End of Session Equipment Utilized During Treatment: Gait belt;Rolling walker;Other (comment)(BSC)  Activity Tolerance: Patient limited by pain Patient left: in bed;with call bell/phone within reach;with bed alarm set  OT Visit Diagnosis: Unsteadiness on feet (R26.81);Other abnormalities of gait and mobility (R26.89);Muscle weakness (generalized) (M62.81);History of falling (Z91.81);Pain Pain - Right/Left: Left Pain - part of body: Hip                Time: 1201-1230 OT Time Calculation (min): 29 min Charges:  OT General Charges $OT Visit: 1 Visit OT Evaluation $OT Eval Moderate Complexity: 1 Mod OT Treatments $Therapeutic Activity: 8-22 mins    Britt Bottom 11/05/2019, 2:15 PM

## 2019-11-05 NOTE — Progress Notes (Signed)
[facility]edic [facility] Progress Note  Patient ID: [name] MRN: [id number] DOB/AGE: 80/10/1940 80 y.o.  Subjective:  Doing well Pain tolerable Sore R hip and sore where axillary roll was (no appreciable long thoracic nerve palsy)  Will need snf  No other complaints    ROS As above  Objective:   VITALS:   Vitals:   11/04/19 2038 11/05/19 0000 11/05/19 0359 11/05/19 0807  BP: 128/75 119/72 (!) 120/58 130/77  Pulse: 62  69 69  Resp: 16  15 16   Temp: (!) 97.5 F (36.4 C)  98.3 F (36.8 C) 98.2 F (36.8 C)  TempSrc: Oral  Oral Oral  SpO2: 100%  96% 96%  Weight:      Height:        Estimated body mass index is 17.49 kg/m as calculated from the following:   Height as of this encounter: 5\' 10"  (1.778 m).   Weight as of this encounter: 55.3 kg.   Intake/Output      03/23 0701 - 03/24 0700 03/24 0701 - 03/25 0700   P.O. 240    I.V. (mL/kg) 1891.7 (34.2)    IV Piggyback 350    Total Intake(mL/kg) 2481.7 (44.9)    Urine (mL/kg/hr) 1150 (0.9)    Blood 200    Total Output 1350    Net +1131.7         Urine Occurrence 800 x      LABS  Results for orders placed or performed during the hospital encounter of 11/04/19 (from the past 24 hour(s))  Surgical pcr screen     Status: None   Collection Time: 11/04/19  8:46 AM   Specimen: Nasal Mucosa; Nasal Swab  Result Value Ref Range   MRSA, PCR NEGATIVE NEGATIVE   Staphylococcus aureus NEGATIVE NEGATIVE  Urine rapid drug screen (hosp performed)     Status: None   Collection Time: 11/04/19 12:49 PM  Result Value Ref Range   Opiates NONE DETECTED NONE DETECTED   Cocaine NONE DETECTED NONE DETECTED   Benzodiazepines NONE DETECTED NONE DETECTED   Amphetamines NONE DETECTED NONE DETECTED   Tetrahydrocannabinol NONE DETECTED NONE DETECTED   Barbiturates NONE DETECTED NONE DETECTED  Ethanol     Status: None   Collection Time: 11/04/19   1:51 PM  Result Value Ref Range   Alcohol, Ethyl (B) <10 <10 mg/dL  Basic metabolic panel     Status: Abnormal   Collection Time: 11/04/19  1:52 PM  Result Value Ref Range   Sodium 132 (L) 135 - 145 mmol/L   Potassium 4.1 3.5 - 5.1 mmol/L   Chloride 103 98 - 111 mmol/L   CO2 11 (L) 22 - 32 mmol/L   Glucose, Bld 67 (L) 70 - 99 mg/dL   BUN 5 (L) 8 - 23 mg/dL   Creatinine, Ser <0.30 (L) 0.44 - 1.00 mg/dL   Calcium 6.5 (L) 8.9 - 10.3 mg/dL   GFR calc non Af Amer NOT CALCULATED >60 mL/min   GFR calc Af Amer NOT CALCULATED >60 mL/min   Anion gap 18 (H) 5 - 15  I-STAT, chem 8     Status: Abnormal   Collection Time: 11/04/19  4:34 PM  Result Value Ref Range   Sodium 134 (L) 135 - 145 mmol/L   Potassium 3.5 3.5 - 5.1  mmol/L   Chloride 99 98 - 111 mmol/L   BUN 7 (L) 8 - 23 mg/dL   Creatinine, Ser 0.70 0.44 - 1.00 mg/dL   Glucose, Bld 187 (H) 70 - 99 mg/dL   Calcium, Ion 1.15 1.15 - 1.40 mmol/L   TCO2 23 22 - 32 mmol/L   Hemoglobin 15.0 12.0 - 15.0 g/dL   HCT 44.0 36.0 - 46.0 %  Glucose, capillary     Status: Abnormal   Collection Time: 11/04/19  6:07 PM  Result Value Ref Range   Glucose-Capillary 139 (H) 70 - 99 mg/dL   Comment 1 Notify RN   CBC     Status: Abnormal   Collection Time: 11/05/19  5:47 AM  Result Value Ref Range   WBC 9.1 4.0 - 10.5 K/uL   RBC 4.42 3.87 - 5.11 MIL/uL   Hemoglobin 14.0 12.0 - 15.0 g/dL   HCT 43.7 36.0 - 46.0 %   MCV 98.9 80.0 - 100.0 fL   MCH 31.7 26.0 - 34.0 pg   MCHC 32.0 30.0 - 36.0 g/dL   RDW 14.0 11.5 - 15.5 %   Platelets 147 (L) 150 - 400 K/uL   nRBC 0.0 0.0 - 0.2 %  Basic metabolic panel     Status: Abnormal   Collection Time: 11/05/19  5:47 AM  Result Value Ref Range   Sodium 138 135 - 145 mmol/L   Potassium 4.0 3.5 - 5.1 mmol/L   Chloride 107 98 - 111 mmol/L   CO2 22 22 - 32 mmol/L   Glucose, Bld 190 (H) 70 - 99 mg/dL   BUN 6 (L) 8 - 23 mg/dL   Creatinine, Ser 0.91 0.44 - 1.00 mg/dL   Calcium 8.1 (L) 8.9 - 10.3 mg/dL   GFR calc  non Af Amer >60 >60 mL/min   GFR calc Af Amer >60 >60 mL/min   Anion gap 9 5 - 15  Glucose, capillary     Status: Abnormal   Collection Time: 11/05/19  6:46 AM  Result Value Ref Range   Glucose-Capillary 168 (H) 70 - 99 mg/dL     PHYSICAL EXAM:   Gen: resting comfortably in bed, NAD, eating breakfast, moderate sarcopenia  Lungs: unlabored Cardiac: reg Abd: + BS, NT Ext:       Right Lower extremity  Dressing R hip c/d/i  Ext warm   + DP pulse  Moderate muscle atrophy/sarcopenia  No DCT  DPN, SPN, TN sensation intact  EHL, FHL, AT, PT, peroneals, gastroc motor intact  + quad set   Assessment/Plan: 1 Day Post-Op   Principal Problem:   Hip fracture (HCC) Active Problems:   Syncope   UTI (urinary tract infection)   Hypoxia   Hypokalemia   Anti-infectives (From admission, onward)   Start     Dose/Rate Route Frequency Ordered Stop   11/04/19 2200  cefTRIAXone (ROCEPHIN) 1 g in sodium chloride 0.9 % 100 mL IVPB     1 g 200 mL/hr over 30 Minutes Intravenous Every 24 hours 11/04/19 0540     11/04/19 1430  cefTRIAXone (ROCEPHIN) 1 g in sodium chloride 0.9 % 100 mL IVPB  Status:  Discontinued     1 g 200 mL/hr over 30 Minutes Intravenous  Once 11/04/19 1425 11/04/19 2003   11/04/19 1416  ceFAZolin (ANCEF) 2-4 GM/100ML-% IVPB    Note to Pharmacy: [facility] Blight   : cabinet override      11/04/19 1416 11/05/19 0229   11/04/19 0300  cefTRIAXone (  ROCEPHIN) 1 g in sodium chloride 0.9 % 100 mL IVPB     1 g 200 mL/hr over 30 Minutes Intravenous  Once 11/04/19 0256 11/04/19 0353    .  POD/HD#: 1  79 y/o female s/p fall with R femoral neck fracture   -low energy fall   - R femoral neck fracture s/p R hip hemiarthroplasty   WBAT R leg with assistance  Posterior hip precautions   Ok to dc abduction pillow   PT/OT  Ice as needed for swelling and pain control  Dressing change tomorrow or Friday    - Pain management:  Continue with current regimen   Minimize narcotics    - ABL anemia/Hemodynamics  Monitor  Cbc in am    - Medical issues   Per primary   - DVT/PE prophylaxis:  lovenox x 28 days  - ID:   periop abx  - Metabolic Bone Disease:  Labs pending  DEXA as outpt  - Activity:  Therapy evals  - FEN/GI prophylaxis/Foley/Lines:  Reg diet  - Dispo:  Therapy evals  SW consult for SNF    Jari Pigg, PA-C [phone] (C) 11/05/2019, 8:35 AM  [facility] [place] [address] [phone] (F)

## 2019-11-05 NOTE — Progress Notes (Signed)
EEG complete - results pending 

## 2019-11-06 ENCOUNTER — Inpatient Hospital Stay (HOSPITAL_COMMUNITY): Payer: Medicare Other

## 2019-11-06 ENCOUNTER — Encounter (HOSPITAL_COMMUNITY): Payer: Self-pay | Admitting: Internal Medicine

## 2019-11-06 DIAGNOSIS — R55 Syncope and collapse: Secondary | ICD-10-CM

## 2019-11-06 DIAGNOSIS — R7989 Other specified abnormal findings of blood chemistry: Secondary | ICD-10-CM

## 2019-11-06 LAB — CBC
HCT: 40.1 % (ref 36.0–46.0)
Hemoglobin: 13 g/dL (ref 12.0–15.0)
MCH: 32.1 pg (ref 26.0–34.0)
MCHC: 32.4 g/dL (ref 30.0–36.0)
MCV: 99 fL (ref 80.0–100.0)
Platelets: 149 10*3/uL — ABNORMAL LOW (ref 150–400)
RBC: 4.05 MIL/uL (ref 3.87–5.11)
RDW: 14.4 % (ref 11.5–15.5)
WBC: 10.6 10*3/uL — ABNORMAL HIGH (ref 4.0–10.5)
nRBC: 0 % (ref 0.0–0.2)

## 2019-11-06 LAB — GLUCOSE, CAPILLARY
Glucose-Capillary: 117 mg/dL — ABNORMAL HIGH (ref 70–99)
Glucose-Capillary: 123 mg/dL — ABNORMAL HIGH (ref 70–99)
Glucose-Capillary: 130 mg/dL — ABNORMAL HIGH (ref 70–99)
Glucose-Capillary: 138 mg/dL — ABNORMAL HIGH (ref 70–99)
Glucose-Capillary: 77 mg/dL (ref 70–99)
Glucose-Capillary: 93 mg/dL (ref 70–99)

## 2019-11-06 LAB — BRAIN NATRIURETIC PEPTIDE: B Natriuretic Peptide: 157.3 pg/mL — ABNORMAL HIGH (ref 0.0–100.0)

## 2019-11-06 LAB — D-DIMER, QUANTITATIVE: D-Dimer, Quant: 1.27 ug/mL-FEU — ABNORMAL HIGH (ref 0.00–0.50)

## 2019-11-06 LAB — TROPONIN I (HIGH SENSITIVITY): Troponin I (High Sensitivity): 52 ng/L — ABNORMAL HIGH

## 2019-11-06 MED ORDER — IPRATROPIUM-ALBUTEROL 0.5-2.5 (3) MG/3ML IN SOLN
3.0000 mL | RESPIRATORY_TRACT | Status: DC | PRN
  Filled 2019-11-06: qty 3

## 2019-11-06 MED ORDER — OXYCODONE HCL 5 MG PO TABS
5.0000 mg | ORAL_TABLET | ORAL | Status: DC | PRN
  Administered 2019-11-07 – 2019-11-09 (×7): 5 mg via ORAL
  Filled 2019-11-06 (×7): qty 1

## 2019-11-06 MED ORDER — FENTANYL CITRATE (PF) 100 MCG/2ML IJ SOLN: 12.5000 ug | INTRAMUSCULAR | Status: DC | PRN

## 2019-11-06 MED ORDER — FUROSEMIDE 10 MG/ML IJ SOLN
20.0000 mg | Freq: Once | INTRAMUSCULAR | Status: AC
  Administered 2019-11-06: 20 mg via INTRAVENOUS
  Filled 2019-11-06: qty 2

## 2019-11-06 MED ORDER — NITROGLYCERIN 0.4 MG SL SUBL
SUBLINGUAL_TABLET | SUBLINGUAL | Status: AC
  Filled 2019-11-06: qty 1

## 2019-11-06 MED ORDER — SODIUM CHLORIDE 0.9 % IV SOLN
2.0000 g | INTRAVENOUS | Status: DC
  Administered 2019-11-06: 2 g via INTRAVENOUS
  Filled 2019-11-06: qty 20

## 2019-11-06 MED ORDER — SODIUM CHLORIDE 0.9% FLUSH
10.0000 mL | Freq: Two times a day (BID) | INTRAVENOUS | Status: DC
  Administered 2019-11-07: 10 mL

## 2019-11-06 MED ORDER — IOHEXOL 350 MG/ML SOLN
100.0000 mL | Freq: Once | INTRAVENOUS | Status: AC | PRN
  Administered 2019-11-06: 100 mL via INTRAVENOUS

## 2019-11-06 MED ORDER — NITROGLYCERIN 0.4 MG SL SUBL
0.4000 mg | SUBLINGUAL_TABLET | SUBLINGUAL | Status: DC | PRN
  Administered 2019-11-06: 0.4 mg via SUBLINGUAL

## 2019-11-06 MED ORDER — SODIUM CHLORIDE 0.9% FLUSH: 10.0000 mL | INTRAVENOUS | Status: DC | PRN

## 2019-11-06 NOTE — Progress Notes (Signed)
Pt started to complain of R side sharp chest pain which worsens with deep breaths upon coming back from CT. Vital signs taken and recorded, MD made aware, PRN pain med given, received new orders, Pt seen by respiratory therapist but refused breathing treatment at this time. Will continue to monitor.

## 2019-11-06 NOTE — Progress Notes (Signed)
Orthopaedic Trauma Service Progress Note  Patient ID: [name] MRN: [id number] DOB/AGE: Jan 23, 1941 79 y.o.  Subjective:  Sleeping No specific complaints  Worked with therapy yesterday and walked in hallway    ROS As above  Objective:   VITALS:   Vitals:   11/05/19 2007 11/05/19 2100 11/06/19 0345 11/06/19 0815  BP: 135/60 135/60 121/62 138/68  Pulse: 80 80 81 91  Resp: 18 18 17 17   Temp: 98.8 F (37.1 C) 98.8 F (37.1 C) 98 F (36.7 C) (!) 100.7 F (38.2 C)  TempSrc: Oral Oral Oral Oral  SpO2: 91% 91% 92% 96%  Weight:      Height:        Estimated body mass index is 17.49 kg/m as calculated from the following:   Height as of this encounter: 5\' 10"  (1.778 m).   Weight as of this encounter: 55.3 kg.   Intake/Output      03/24 0701 - 03/25 0700 03/25 0701 - 03/26 0700   P.O. 960    I.V. (mL/kg) 717.7 (13)    IV Piggyback 122.6    Total Intake(mL/kg) 1800.3 (32.6)    Urine (mL/kg/hr) 700 (0.5)    Blood     Total Output 700    Net +1100.3         Urine Occurrence 1 x      LABS  Results for orders placed or performed during the hospital encounter of 11/04/19 (from the past 24 hour(s))  Glucose, capillary     Status: Abnormal   Collection Time: 11/05/19 11:43 AM  Result Value Ref Range   Glucose-Capillary 151 (H) 70 - 99 mg/dL  Glucose, capillary     Status: Abnormal   Collection Time: 11/05/19  4:44 PM  Result Value Ref Range   Glucose-Capillary 118 (H) 70 - 99 mg/dL  Glucose, capillary     Status: Abnormal   Collection Time: 11/05/19  8:09 PM  Result Value Ref Range   Glucose-Capillary 156 (H) 70 - 99 mg/dL  Glucose, capillary     Status: Abnormal   Collection Time: 11/06/19 12:03 AM  Result Value Ref Range   Glucose-Capillary 117 (H) 70 - 99 mg/dL  Glucose, capillary     Status: Abnormal   Collection Time: 11/06/19  3:54 AM  Result Value Ref Range   Glucose-Capillary 123 (H) 70 - 99 mg/dL  CBC     Status: Abnormal   Collection Time: 11/06/19  6:21 AM  Result Value Ref Range   WBC 10.6 (H) 4.0 - 10.5 K/uL   RBC 4.05 3.87 - 5.11 MIL/uL   Hemoglobin 13.0 12.0 - 15.0 g/dL   HCT 40.1 36.0 - 46.0 %   MCV 99.0 80.0 - 100.0 fL   MCH 32.1 26.0 - 34.0 pg   MCHC 32.4 30.0 - 36.0 g/dL   RDW 14.4 11.5 - 15.5 %   Platelets 149 (L) 150 - 400 K/uL   nRBC 0.0 0.0 - 0.2 %  D-dimer, quantitative (not at [facility])     Status: Abnormal   Collection Time: 11/06/19  6:21 AM  Result Value Ref Range   D-Dimer, Quant 1.27 (H) 0.00 - 0.50 ug/mL-FEU  Glucose, capillary     Status: None   Collection Time: 11/06/19  8:16 AM  Result Value Ref Range  Glucose-Capillary 77 70 - 99 mg/dL     PHYSICAL EXAM:   Gen: sleeping but arousable  Lungs: unlabored Ext:       Right Lower extremity             Dressing R hip c/d/i             Ext warm              + DP pulse             Moderate muscle atrophy/sarcopenia             No DCT             DPN, SPN, TN sensation intact             EHL, FHL, AT, PT, peroneals, gastroc motor intact            Assessment/Plan: 2 Days Post-Op   Principal Problem:   Closed displaced fracture of right femoral neck (HCC) Active Problems:   Vitamin D insufficiency   Osteoporosis with fracture   Syncope   UTI (urinary tract infection)   Hypoxia   Hypokalemia   Anti-infectives (From admission, onward)   Start     Dose/Rate Route Frequency Ordered Stop   11/05/19 1800  ampicillin (OMNIPEN) 1 g in sodium chloride 0.9 % 100 mL IVPB     1 g 300 mL/hr over 20 Minutes Intravenous Every 6 hours 11/05/19 1609     11/04/19 2200  cefTRIAXone (ROCEPHIN) 1 g in sodium chloride 0.9 % 100 mL IVPB  Status:  Discontinued     1 g 200 mL/hr over 30 Minutes Intravenous Every 24 hours 11/04/19 0540 11/05/19 1609   11/04/19 1430  cefTRIAXone (ROCEPHIN) 1 g in sodium chloride 0.9 % 100 mL IVPB  Status:  Discontinued     1 g 200 mL/hr  over 30 Minutes Intravenous  Once 11/04/19 1425 11/04/19 2003   11/04/19 1416  ceFAZolin (ANCEF) 2-4 GM/100ML-% IVPB    Note to Pharmacy: [facility] Blight   : cabinet override      11/04/19 1416 11/05/19 0229   11/04/19 0300  cefTRIAXone (ROCEPHIN) 1 g in sodium chloride 0.9 % 100 mL IVPB     1 g 200 mL/hr over 30 Minutes Intravenous  Once 11/04/19 0256 11/04/19 0353    .  POD/HD#: 2  79 y/o female s/p fall with R femoral neck fracture    -low energy fall    - R femoral neck fracture s/p R hip hemiarthroplasty              WBAT R leg with assistance             Posterior hip precautions                         Ok to dc abduction pillow              PT/OT             Ice as needed for swelling and pain control             Dressing change tomorrow      - Pain management:             Continue with current regimen              Minimize narcotics    - ABL anemia/Hemodynamics  Monitor             Cbc in am               - Medical issues              Per primary    - DVT/PE prophylaxis:             lovenox x 28 days   - ID:              periop abx   - Metabolic Bone Disease:             vitamin d insufficiency    Supplement              DEXA as outpt   - Activity:             Therapy evals   - FEN/GI prophylaxis/Foley/Lines:             Reg diet   - Dispo:             Therapy              SW consult for SNF   Jari Pigg, PA-C [phone] (C) 11/06/2019, 9:20 AM  [facility] [place] [phone] (F)

## 2019-11-06 NOTE — Procedures (Signed)
Patient Name: [name]  MRN: [id number]  Epilepsy Attending: Lora Havens  Referring Physician/Provider: Ainsley Spinner, PA Date: 11/05/2019 Duration: 23.22 minutes  Patient history: 79 year old female presented after syncope.  EEG to evaluate for seizures.  Level of alertness: Awake  AEDs during EEG study: Lorazepam  Technical aspects: This EEG study was done with scalp electrodes positioned according to the 10-20 International system of electrode placement. Electrical activity was acquired at a sampling rate of 500Hz  and reviewed with a high frequency filter of 70Hz  and a low frequency filter of 1Hz . EEG data were recorded continuously and digitally stored.   Description: The posterior dominant rhythm consists of 8 Hz activity of moderate voltage (25-35 uV) seen predominantly in posterior head regions, symmetric and reactive to eye opening and eye closing. Hyperventilation and photic stimulation were not performed.  IMPRESSION: This study is within normal limits. No seizures or epileptiform discharges were seen throughout the recording.  Lakshmi Sundeen Barbra Sarks

## 2019-11-06 NOTE — Progress Notes (Signed)
Physical Therapy Treatment Patient Details Name: [name] MRN: [id number] DOB: May 30, 1941 Today's Date: 11/06/2019    History of Present Illness [name] is a 79 y.o. female with medical history significant of alcohol abuse, anemia, arthritis, breast cancer, CVA, depression, GERD, gout, hypertension, hyperlipidemia, paroxysmal A. Fib presenting to the ED for evaluation of right hip pain secondary to a fall. She was found to have a hip fx and is now s/p UNIPOLAR HEMIARTHROPLASTY OF THE RIGHT HIP.    PT Comments    Pt making slow progress with functional mobility. She was very limited this session secondary to pain, weakness and fatigue. She required continuous cueing to maintain posterior hip precautions throughout with functional mobility. Continue to recommend pt to d/c SNF for further intensive therapy services. Pt would continue to benefit from skilled physical therapy services at this time while admitted and after d/c to address the below listed limitations in order to improve overall safety and independence with functional mobility.    Follow Up Recommendations  SNF     Equipment Recommendations  Other (comment)(defer to next venue of care)    Recommendations for Other Services       Precautions / Restrictions Precautions Precautions: Posterior Hip Precaution Booklet Issued: Yes (comment) Precaution Comments: pt unable to recall precautions; reviewed in full throughout and required frequent cueing to maintain Restrictions Weight Bearing Restrictions: Yes RLE Weight Bearing: Weight bearing as tolerated    Mobility  Bed Mobility Overal bed mobility: Needs Assistance Bed Mobility: Supine to Sit     Supine to sit: Min assist     General bed mobility comments: increased time and effort, use of bed rails, min A with movement of R LE off of bed  Transfers Overall transfer level: Needs assistance Equipment used: Rolling walker (2 wheeled) Transfers: Sit  to/from Stand Sit to Stand: Max assist         General transfer comment: pt with very slow, antalgic movement requiring max A to achieve upright standing position from EOB. PT provided cueing to maintain posterior hip precautions throughout; however, with return to sit, pt with poor eccentric muscle control and quickly sitting down  Ambulation/Gait Ambulation/Gait assistance: Min assist Gait Distance (Feet): 50 Feet Assistive device: Rolling walker (2 wheeled) Gait Pattern/deviations: Step-through pattern;Step-to pattern;Decreased step length - right;Decreased step length - left;Decreased stride length;Decreased weight shift to right;Drifts right/left Gait velocity: decreased   General Gait Details: pt with slow, cautious and guarded gait; she required frequent assistance from therapist with RW management; cueing for sequencing of LEs with AD   Stairs             Wheelchair Mobility    Modified Rankin (Stroke Patients Only)       Balance Overall balance assessment: Needs assistance;History of Falls Sitting-balance support: Feet supported Sitting balance-Leahy Scale: Fair     Standing balance support: Bilateral upper extremity supported;During functional activity Standing balance-Leahy Scale: Poor Standing balance comment: reliant on bilateral UEs on RW                            Cognition Arousal/Alertness: Awake/alert Behavior During Therapy: WFL for tasks assessed/performed Overall Cognitive Status: Impaired/Different from baseline Area of Impairment: Safety/judgement;Problem solving;Memory                     Memory: Decreased recall of precautions   Safety/Judgement: Decreased awareness of deficits   Problem Solving: Slow processing;Difficulty sequencing;Requires  verbal cues        Exercises General Exercises - Lower Extremity Ankle Circles/Pumps: AROM;Both;20 reps;Seated    General Comments        Pertinent Vitals/Pain Pain  Assessment: 0-10 Pain Score: 10-Worst pain ever Pain Location: R hip Pain Descriptors / Indicators: Grimacing;Guarding;Discomfort Pain Intervention(s): Monitored during session;Repositioned;RN gave pain meds during session    Home Living                      Prior Function            PT Goals (current goals can now be found in the care plan section) Acute Rehab PT Goals PT Goal Formulation: With patient Time For Goal Achievement: 11/18/19 Potential to Achieve Goals: Good Progress towards PT goals: Progressing toward goals    Frequency    Min 3X/week      PT Plan Current plan remains appropriate    Co-evaluation              AM-PAC PT "6 Clicks" Mobility   Outcome Measure  Help needed turning from your back to your side while in a flat bed without using bedrails?: A Little Help needed moving from lying on your back to sitting on the side of a flat bed without using bedrails?: A Little Help needed moving to and from a bed to a chair (including a wheelchair)?: A Little Help needed standing up from a chair using your arms (e.g., wheelchair or bedside chair)?: A Lot Help needed to walk in hospital room?: A Little Help needed climbing 3-5 steps with a railing? : Total 6 Click Score: 15    End of Session Equipment Utilized During Treatment: Gait belt Activity Tolerance: Patient limited by pain;Patient limited by fatigue Patient left: in chair;with call bell/phone within reach;with chair alarm set Nurse Communication: Mobility status PT Visit Diagnosis: Other abnormalities of gait and mobility (R26.89);Pain Pain - Right/Left: Right Pain - part of body: Hip     Time: 0927-0952 PT Time Calculation (min) (ACUTE ONLY): 25 min  Charges:  $Gait Training: 8-22 mins $Therapeutic Activity: 8-22 mins                     Anastasio Champion, DPT  Acute Rehabilitation Services Pager [phone] Office [place] 11/06/2019, 10:11  AM

## 2019-11-06 NOTE — Progress Notes (Signed)
Bilateral lower extremity venous duplex has been completed. Preliminary results can be found in CV Proc through chart review.   11/06/19 12:19 PM [name] RVT

## 2019-11-06 NOTE — Anesthesia Postprocedure Evaluation (Signed)
Anesthesia Post Note  Patient: Eureka Wuollet  Procedure(s) Performed: ARTHROPLASTY BIPOLAR HIP (HEMIARTHROPLASTY) (Right Hip)     Patient location during evaluation: PACU Anesthesia Type: General Level of consciousness: awake and alert Pain management: pain level controlled Vital Signs Assessment: post-procedure vital signs reviewed and stable Respiratory status: spontaneous breathing, nonlabored ventilation, respiratory function stable and patient connected to nasal cannula oxygen Cardiovascular status: blood pressure returned to baseline and stable Postop Assessment: no apparent nausea or vomiting Anesthetic complications: no    Last Vitals:  Vitals:   11/06/19 0345 11/06/19 0815  BP: 121/62 138/68  Pulse: 81 91  Resp: 17 17  Temp: 36.7 C (!) 38.2 C  SpO2: 92% 96%    Last Pain:  Vitals:   11/06/19 0815  TempSrc: Oral  PainSc:                  Tiajuana Amass

## 2019-11-06 NOTE — Plan of Care (Signed)

## 2019-11-06 NOTE — Progress Notes (Addendum)
[facility]                                                                              Patient Demographics  [name], is a 79 y.o. female, DOB - [birth year], [id number]  Admit date - 11/04/2019   Admitting Physician Shela Leff, MD  Outpatient Primary MD for the patient is Josetta Huddle, MD  Outpatient specialists:   LOS - 2  days   Medical records reviewed and are as summarized below:    Chief Complaint  Patient presents with  . Fall  . Hip Pain       Brief summary   79yo with hx ETOH abuse, arthirits, CVA, HTN, Afib presented with fall, resulting in R hip fracture.  Orthopedics was consulted, underwent hemiarthroplasty of the right hip on 3/23   Assessment & Plan    Principal Problem: Acute right hip fracture ([place]) -Status post hemiarthroplasty right hip on 3/23, postop day #2 -Weightbearing as tolerated per orthopedics, started PT, recommended SNF -Continue pain control, DVT prophylaxis per orthopedics -Low-grade temp 100.7 F today, will follow closely, currently on antibiotics for Enterococcus UTI -COVID-19 was negative on 3/23  Active Problems:   Syncope -Likely vasovagal: 3/24 showed EF of 55 to 60%, normal left ventricular function, no aortic stenosis -CT head unremarkable, EEG normal -D-dimer 1.27, obtain venous Dopplers of the lower extremity, negative, will pursue CT angiogram of the chest -O2 sats 96% on room Addendum 6:00pm - venous dopplers neg for DVT, CTA neg for PE but shows b/l consolidations, pleural effusions - patient c/p right sided pleuritic CP, hypoxia - IV fluids were dc'ed this AM, I/O's  +3.7 L, ordered Lasix 20mg  IV x1, obtain BNP. May repeat another dose.  - started on IV rocephin for CAP PNA, mild leukocytosis, low grade temp this am - d/w RN, decreased pain medications, placed on incentive spirometry, Flutter valve, duonebs   Vitamin D deficiency -Vitamin D 24.86, continue daily  replacement   Enterococcus UTI (urinary tract infection) -Urine culture showed Enterococcus, continue ampicillin per sensitivities -Low-grade temp 100.7 F, if persistent, obtain blood cultures -Continue incentive spirometry, mobilization  Protein calorie malnutrition Estimated body mass index is 17.49 kg/m as calculated from the following:   Height as of this encounter: 5\' 10"  (1.778 m).   Weight as of this encounter: 55.3 kg.  Registered dietitian consult  History of alcohol use -Continue CIWA protocol with Ativan, currently stable not in acute withdrawals    Code Status: Full code DVT Prophylaxis:  Lovenox  Family Communication: Discussed all imaging results, lab results, explained to the patient.  Called patient's sons Hewitt Shorts on phone,  unable to reach.   Disposition Plan: Patient from [facility], lives at home alone, was visiting her son here when she fell.  Likely will need higher level of care/SNF, rehab due to acute fracture with repair.  Anticipated discharge in 24 to 48 hours pending bed availability   Time Spent in minutes   35 minutes  Procedures:  UNIPOLAR HEMIARTHROPLASTY OF THE RIGHT HIP   Consultants:   Orthopedics  Antimicrobials:   Anti-infectives (From admission, onward)  Start     Dose/Rate Route Frequency Ordered Stop   11/05/19 1800  ampicillin (OMNIPEN) 1 g in sodium chloride 0.9 % 100 mL IVPB     1 g 300 mL/hr over 20 Minutes Intravenous Every 6 hours 11/05/19 1609     11/04/19 2200  cefTRIAXone (ROCEPHIN) 1 g in sodium chloride 0.9 % 100 mL IVPB  Status:  Discontinued     1 g 200 mL/hr over 30 Minutes Intravenous Every 24 hours 11/04/19 0540 11/05/19 1609   11/04/19 1430  cefTRIAXone (ROCEPHIN) 1 g in sodium chloride 0.9 % 100 mL IVPB  Status:  Discontinued     1 g 200 mL/hr over 30 Minutes Intravenous  Once 11/04/19 1425 11/04/19 2003   11/04/19 1416  ceFAZolin (ANCEF) 2-4 GM/100ML-% IVPB    Note to Pharmacy: Grace Blight   : cabinet override      11/04/19 1416 11/05/19 0229   11/04/19 0300  cefTRIAXone (ROCEPHIN) 1 g in sodium chloride 0.9 % 100 mL IVPB     1 g 200 mL/hr over 30 Minutes Intravenous  Once 11/04/19 0256 11/04/19 0353          Medications  Scheduled Meds: . vitamin C  500 mg Oral Daily  . Chlorhexidine Gluconate Cloth  6 each Topical Daily  . cholecalciferol  2,000 Units Oral BID  . docusate sodium  100 mg Oral BID  . enoxaparin (LOVENOX) injection  40 mg Subcutaneous Q24H  . folic acid  1 mg Oral Daily  . multivitamin with minerals  1 tablet Oral Daily  . pantoprazole  40 mg Oral Daily  . polyethylene glycol  17 g Oral Daily  . thiamine  100 mg Oral Daily  . traMADol  50 mg Oral Q12H   Continuous Infusions: . ampicillin (OMNIPEN) IV 1 g (11/06/19 0601)  . dextrose 5% lactated ringers Stopped (11/05/19 1803)   PRN Meds:.acetaminophen, alum & mag hydroxide-simeth, fentaNYL (SUBLIMAZE) injection, HYDROmorphone (DILAUDID) injection, LORazepam **OR** LORazepam, menthol-cetylpyridinium **OR** phenol, metoCLOPramide **OR** metoCLOPramide (REGLAN) injection, ondansetron **OR** ondansetron (ZOFRAN) IV, oxyCODONE, oxyCODONE      Subjective:   [name] was seen and examined today.  No acute complaints, pain is controlled, 2/10.  Low-grade temp one reading of 100.7 degrees currently.  No chills or any hypoxia. Patient denies dizziness, chest pain, shortness of breath, abdominal pain, N/V/D/C, new weakness, numbess, tingling. No acute events overnight.    Objective:   Vitals:   11/05/19 2007 11/05/19 2100 11/06/19 0345 11/06/19 0815  BP: 135/60 135/60 121/62 138/68  Pulse: 80 80 81 91  Resp: 18 18 17 17   Temp: 98.8 F (37.1 C) 98.8 F (37.1 C) 98 F (36.7 C) (!) 100.7 F (38.2 C)  TempSrc: Oral Oral Oral Oral  SpO2: 91% 91% 92% 96%  Weight:      Height:        Intake/Output Summary (Last 24 hours) at 11/06/2019 [id number] Last data filed at 11/06/2019 0500 Gross per 24  hour  Intake 1560.25 ml  Output 700 ml  Net 860.25 ml     Wt Readings from Last 3 Encounters:  11/04/19 55.3 kg  11/02/18 54.4 kg  11/01/18 56.2 kg     Exam  General: Alert and oriented to self and place, NAD, appears comfortable  Cardiovascular: S1 S2 auscultated, RRR  Respiratory: Clear to auscultation bilaterally, no wheezing, rales or rhonchi  Gastrointestinal: Soft, nontender, nondistended, + bowel sounds  Ext: no pedal edema bilaterally  Neuro: No new deficits  Musculoskeletal: No digital cyanosis, clubbing  Skin: No rashes  Psych: Normal affect and demeanor   Data Reviewed:  I have personally reviewed following labs and imaging studies  Micro Results Recent Results (from the past 240 hour(s))  Urine culture     Status: Abnormal   Collection Time: 11/04/19  2:56 AM   Specimen: Urine, Catheterized  Result Value Ref Range Status   Specimen Description   Final    URINE, CATHETERIZED Performed at [facility] [address], [place], [place] [address]    Special Requests   Final    Normal Performed at [facility], [place] [address] [place], [place] [address]    Culture >=100,000 COLONIES/mL ENTEROCOCCUS FAECALIS (A)  Final   Report Status 11/05/2019 FINAL  Final   Organism ID, Bacteria ENTEROCOCCUS FAECALIS (A)  Final      Susceptibility   Enterococcus faecalis - MIC*    AMPICILLIN <=2 SENSITIVE Sensitive     NITROFURANTOIN <=16 SENSITIVE Sensitive     VANCOMYCIN 1 SENSITIVE Sensitive     * >=100,000 COLONIES/mL ENTEROCOCCUS FAECALIS  Respiratory Panel by RT PCR (Flu A&B, Covid) - Nasopharyngeal Swab     Status: None   Collection Time: 11/04/19  4:44 AM   Specimen: Nasopharyngeal Swab  Result Value Ref Range Status   SARS Coronavirus 2 by RT PCR NEGATIVE NEGATIVE Final    Comment: (NOTE) SARS-CoV-2 target nucleic acids are NOT DETECTED. The SARS-CoV-2 RNA is generally detectable in upper respiratoy specimens  during the acute phase of infection. The lowest concentration of SARS-CoV-2 viral copies this assay can detect is 131 copies/mL. A negative result does not preclude SARS-Cov-2 infection and should not be used as the sole basis for treatment or other patient management decisions. A negative result may occur with  improper specimen collection/handling, submission of specimen other than nasopharyngeal swab, presence of viral mutation(s) within the areas targeted by this assay, and inadequate number of viral copies (<131 copies/mL). A negative result must be combined with clinical observations, patient history, and epidemiological information. The expected result is Negative. Fact Sheet for Patients:  PinkCheek.be Fact Sheet for Healthcare Providers:  GravelBags.it This test is not yet ap proved or cleared by the [place] FDA and  has been authorized for detection and/or diagnosis of SARS-CoV-2 by FDA under an Emergency Use Authorization (EUA). This EUA will remain  in effect (meaning this test can be used) for the duration of the COVID-19 declaration under Section 564(b)(1) of the Act, 21 U.S.C. section 360bbb-3(b)(1), unless the authorization is terminated or revoked sooner.    Influenza A by PCR NEGATIVE NEGATIVE Final   Influenza B by PCR NEGATIVE NEGATIVE Final    Comment: (NOTE) The Xpert Xpress SARS-CoV-2/FLU/RSV assay is intended as an aid in  the diagnosis of influenza from Nasopharyngeal swab specimens and  should not be used as a sole basis for treatment. Nasal washings and  aspirates are unacceptable for Xpert Xpress SARS-CoV-2/FLU/RSV  testing. Fact Sheet for Patients: PinkCheek.be Fact Sheet for Healthcare Providers: GravelBags.it This test is not yet approved or cleared by the [place] FDA and  has been authorized for detection and/or diagnosis of  SARS-CoV-2 by  FDA under an Emergency Use Authorization (EUA). This EUA will remain  in effect (meaning this test can be used) for the duration of the  Covid-19 declaration under Section 564(b)(1) of the Act, 21  U.S.C. section 360bbb-3(b)(1), unless the authorization is  terminated or revoked. Performed at  [facility], [place] [address], [place], [place] [address]   Surgical pcr screen     Status: None   Collection Time: 11/04/19  8:46 AM   Specimen: Nasal Mucosa; Nasal Swab  Result Value Ref Range Status   MRSA, PCR NEGATIVE NEGATIVE Final   Staphylococcus aureus NEGATIVE NEGATIVE Final    Comment: (NOTE) The Xpert SA Assay (FDA approved for NASAL specimens in patients 9 years of age and older), is one component of a comprehensive surveillance program. It is not intended to diagnose infection nor to guide or monitor treatment. Performed at [facility], [place] [address], [place],  [address]     Radiology Reports DG Ribs Unilateral W/Chest Right  Result Date: 11/04/2019 CLINICAL DATA:  Fall, right chest pain EXAM: RIGHT RIBS AND CHEST - 3+ VIEW COMPARISON:  07/18/2018 CT FINDINGS: No visible acute bony abnormality. No rib fracture. Heart is normal size. Bibasilar atelectasis. No effusions. IMPRESSION: No visible rib fracture. Bibasilar atelectasis. Electronically Signed   By: Rolm Baptise M.D.   On: 11/04/2019 02:52   CT HEAD WO CONTRAST  Result Date: 11/04/2019 CLINICAL DATA:  Penetrating head trauma.  Fall from bed. EXAM: CT HEAD WITHOUT CONTRAST TECHNIQUE: Contiguous axial images were obtained from the base of the skull through the vertex without intravenous contrast. COMPARISON:  04/10/2017 FINDINGS: Brain: No evidence of acute infarction, hemorrhage, hydrocephalus, extra-axial collection or mass lesion/mass effect. Remote small vessel infarct at the left corona radiata. Vascular: No hyperdense vessel or unexpected calcification. Skull: Normal.  Negative for fracture or focal lesion. Sinuses/Orbits: No acute finding. IMPRESSION: No evidence of intracranial injury. Electronically Signed   By: Monte Fantasia M.D.   On: 11/04/2019 07:55   ECHOCARDIOGRAM COMPLETE  Result Date: 11/05/2019    ECHOCARDIOGRAM REPORT   Patient Name:   MAGGIEMAE DIGIOVANNA Politano Date of Exam: 11/05/2019 Medical Rec #:  [id number]        Height:       70.0 in Accession #:    [id number]       Weight:       121.9 lb Date of Birth:  November 30, 1940       BSA:          1.691 m Patient Age:    59 years         BP:           130/77 mmHg Patient Gender: F                HR:           77 bpm. Exam Location:  Inpatient Procedure: 2D Echo, Cardiac Doppler and Color Doppler Indications:    R55 Syncope  History:        Patient has prior history of Echocardiogram examinations, most                 recent 10/29/2018. Stroke, Arrythmias:Atrial Fibrillation,                 Signs/Symptoms:Syncope and Chest Pain; Risk                 Factors:Dyslipidemia. Hypoxia. ETOH. History of breast cancer.  Sonographer:    Roseanna Rainbow RDCS Referring Phys: [place]  Sonographer Comments: Technically difficult study due to poor echo windows. Restricted mobility. IMPRESSIONS  1. Left ventricular ejection fraction, by estimation, is 55 to 60%. The left ventricle has normal function. The left ventricle has no regional wall motion abnormalities. Left  ventricular diastolic parameters were normal.  2. Right ventricular systolic function is normal. The right ventricular size is normal. There is moderately elevated pulmonary artery systolic pressure.  3. The mitral valve is normal in structure. Trivial mitral valve regurgitation. No evidence of mitral stenosis.  4. Tricuspid valve regurgitation is moderate.  5. The aortic valve is tricuspid. Aortic valve regurgitation is not visualized. Mild to moderate aortic valve sclerosis/calcification is present, without any evidence of aortic stenosis.  6. March 2020 aortic root measured  3.9 cm.  7. The inferior vena cava is normal in size with greater than 50% respiratory variability, suggesting right atrial pressure of 3 mmHg. FINDINGS  Left Ventricle: Left ventricular ejection fraction, by estimation, is 55 to 60%. The left ventricle has normal function. The left ventricle has no regional wall motion abnormalities. The left ventricular internal cavity size was normal in size. There is  no left ventricular hypertrophy. Left ventricular diastolic parameters were normal. Right Ventricle: The right ventricular size is normal. No increase in right ventricular wall thickness. Right ventricular systolic function is normal. There is moderately elevated pulmonary artery systolic pressure. The tricuspid regurgitant velocity is 3.08 m/s, and with an assumed right atrial pressure of 15 mmHg, the estimated right ventricular systolic pressure is [id number] mmHg. Left Atrium: Left atrial size was normal in size. Right Atrium: Right atrial size was normal in size. Pericardium: There is no evidence of pericardial effusion. Mitral Valve: The mitral valve is normal in structure. There is mild thickening of the mitral valve leaflet(s). There is mild calcification of the mitral valve leaflet(s). Normal mobility of the mitral valve leaflets. Trivial mitral valve regurgitation. No evidence of mitral valve stenosis. Tricuspid Valve: The tricuspid valve is normal in structure. Tricuspid valve regurgitation is moderate . No evidence of tricuspid stenosis. Aortic Valve: The aortic valve is tricuspid. . There is moderate thickening and moderate calcification of the aortic valve. Aortic valve regurgitation is not visualized. Mild to moderate aortic valve sclerosis/calcification is present, without any evidence of aortic stenosis. There is moderate thickening of the aortic valve. There is moderate calcification of the aortic valve. Pulmonic Valve: The pulmonic valve was normal in structure. Pulmonic valve regurgitation is trivial.  No evidence of pulmonic stenosis. Aorta: March 2020 aortic root measured 3.9 cm. The aortic root is normal in size and structure. Venous: The inferior vena cava is normal in size with greater than 50% respiratory variability, suggesting right atrial pressure of 3 mmHg. IAS/Shunts: No atrial level shunt detected by color flow Doppler.  LEFT VENTRICLE PLAX 2D LVOT diam:     2.20 cm     Diastology LV SV:         71          LV e' lateral:   8.59 cm/s LV SV Index:   42          LV E/e' lateral: 9.8 LVOT Area:     3.80 cm    LV e' medial:    4.24 cm/s                            LV E/e' medial:  19.8  LV Volumes (MOD) LV vol d, MOD A2C: 44.5 ml LV vol d, MOD A4C: 48.8 ml LV vol s, MOD A2C: 11.3 ml LV vol s, MOD A4C: 11.6 ml LV SV MOD A2C:     33.2 ml LV SV MOD A4C:     48.8 ml  LV SV MOD BP:      34.5 ml RIGHT VENTRICLE            IVC RV S prime:     6.85 cm/s  IVC diam: 1.54 cm TAPSE (M-mode): 1.6 cm LEFT ATRIUM             Index       RIGHT ATRIUM           Index LA Vol (A2C):   19.9 ml 11.77 ml/m RA Area:     13.20 cm LA Vol (A4C):   18.5 ml 10.94 ml/m RA Volume:   31.80 ml  18.80 ml/m LA Biplane Vol: 20.6 ml 12.18 ml/m  AORTIC VALVE LVOT Vmax:   95.80 cm/s LVOT Vmean:  67.900 cm/s LVOT VTI:    0.188 m  AORTA Ao Asc diam: 3.70 cm MITRAL VALVE                TRICUSPID VALVE MV Area (PHT): 2.66 cm     TR Peak grad:   37.9 mmHg MV Decel Time: 285 msec     TR Vmax:        308.00 cm/s MV E velocity: 83.90 cm/s MV A velocity: 113.00 cm/s  SHUNTS MV E/A ratio:  0.74         Systemic VTI:  0.19 m                             Systemic Diam: 2.20 cm Jenkins Rouge MD Electronically signed by Jenkins Rouge MD Signature Date/Time: 11/05/2019/11:48:29 AM    Final    DG Hip Port Unilat With Pelvis 1V Right  Result Date: 11/04/2019 CLINICAL DATA:  Status post right total hip replacement EXAM: DG HIP (WITH OR WITHOUT PELVIS) 2V PORT RIGHT COMPARISON:  Preoperative pelvis and right hip images November 04, 2019 FINDINGS: Frontal  pelvis as well as lateral right hip images obtained. There is a new total hip replacement on the right with prosthetic components well-seated. Prior total hip replacement on the left with prosthetic components well-seated as well. No fracture or dislocation evident postoperative. Sacroiliac joints appear normal bilaterally. IMPRESSION: New total hip replacement right with prosthetic components well-seated. Total hip replacement on the left with prosthetic components well-seated. No acute fracture or dislocation evident. Electronically Signed   By: Lowella Grip III M.D.   On: 11/04/2019 19:31   DG Hip Unilat W or Wo Pelvis 2-3 Views Right  Result Date: 11/04/2019 CLINICAL DATA:  Fall out of bed.  Right hip pain EXAM: DG HIP (WITH OR WITHOUT PELVIS) 2-3V RIGHT COMPARISON:  04/10/2017 FINDINGS: There is a right femoral neck fracture. Varus angulation. No subluxation or dislocation. Prior left hip replacement. IMPRESSION: Right femoral neck fracture with varus angulation. Electronically Signed   By: Rolm Baptise M.D.   On: 11/04/2019 02:51   VAS US CAROTID  Result Date: 11/04/2019 Carotid Arterial Duplex Study Indications:       Syncope. Limitations        Today's exam was limited due to the high bifurcation of the                    carotid, the patient's respiratory variation and patient                    somnolence, patient movement. Comparison Study:  No prior studies. Performing Technologist: Oliver Hum RVT  Examination Guidelines: A complete  evaluation includes B-mode imaging, spectral Doppler, color Doppler, and power Doppler as needed of all accessible portions of each vessel. Bilateral testing is considered an integral part of a complete examination. Limited examinations for reoccurring indications may be performed as noted.  Right Carotid Findings: +----------+--------+--------+--------+-----------------------+--------------+           PSV cm/sEDV cm/sStenosisPlaque Description      Comments       +----------+--------+--------+--------+-----------------------+--------------+ CCA Prox  61      7               smooth and heterogenous               +----------+--------+--------+--------+-----------------------+--------------+ CCA Distal52      6               smooth and heterogenous               +----------+--------+--------+--------+-----------------------+--------------+ ICA Prox  27      6               smooth and heterogenous               +----------+--------+--------+--------+-----------------------+--------------+ ICA Distal                                               Not visualized +----------+--------+--------+--------+-----------------------+--------------+ ECA       158     11                                                    +----------+--------+--------+--------+-----------------------+--------------+ +----------+--------+-------+--------+-------------------+           PSV cm/sEDV cmsDescribeArm Pressure (mmHG) +----------+--------+-------+--------+-------------------+ [id number]                                         +----------+--------+-------+--------+-------------------+ +---------+--------+--+--------+-+---------+ VertebralPSV cm/s27EDV cm/s4Antegrade +---------+--------+--+--------+-+---------+  Left Carotid Findings: +----------+--------+--------+--------+-----------------------+--------+           PSV cm/sEDV cm/sStenosisPlaque Description     Comments +----------+--------+--------+--------+-----------------------+--------+ CCA Prox  62      12              smooth and heterogenous         +----------+--------+--------+--------+-----------------------+--------+ CCA Distal58      11              smooth and heterogenous         +----------+--------+--------+--------+-----------------------+--------+ ICA Prox  54      17              smooth and heterogenous          +----------+--------+--------+--------+-----------------------+--------+ ICA Distal65      21                                     tortuous +----------+--------+--------+--------+-----------------------+--------+ ECA       240     22                                              +----------+--------+--------+--------+-----------------------+--------+ +----------+--------+--------+--------+-------------------+  PSV cm/sEDV cm/sDescribeArm Pressure (mmHG) +----------+--------+--------+--------+-------------------+ Subclavian132                                         +----------+--------+--------+--------+-------------------+ +---------+--------+--+--------+-+---------+ VertebralPSV cm/s44EDV cm/s9Antegrade +---------+--------+--+--------+-+---------+   Summary: Right Carotid: Velocities in the right ICA are consistent with a 1-39% stenosis. Left Carotid: Velocities in the left ICA are consistent with a 1-39% stenosis. Vertebrals: Bilateral vertebral arteries demonstrate antegrade flow. *See table(s) above for measurements and observations.  Electronically signed by Deitra Mayo MD on 11/04/2019 at 1:08:46 PM.    Final     Lab Data:  CBC: Recent Labs  Lab 11/04/19 0330 11/04/19 1634 11/05/19 0547 11/06/19 0621  WBC 11.6*  --  9.1 10.6*  NEUTROABS 10.1*  --   --   --   HGB 14.4 15.0 14.0 13.0  HCT 45.4 44.0 43.7 40.1  MCV 99.8  --  98.9 99.0  PLT 162  --  147* [id number]*   Basic Metabolic Panel: Recent Labs  Lab 11/04/19 0330 11/04/19 1352 11/04/19 1634 11/05/19 0547  NA 136 132* 134* 138  K 2.9* 4.1 3.5 4.0  CL 99 103 99 107  CO2 22 11*  --  22  GLUCOSE 150* 67* 187* 190*  BUN 8 5* 7* 6*  CREATININE 0.85 <0.30* 0.70 0.91  CALCIUM 7.9* 6.5*  --  8.1*  MG 1.8  --   --   --   PHOS 3.3  --   --   --    GFR: Estimated Creatinine Clearance: 43.8 mL/min (by C-G formula based on SCr of 0.91 mg/dL). Liver Function Tests: Recent Labs  Lab  11/04/19 0330  AST 22  ALT 15  ALKPHOS 71  BILITOT 0.8  PROT 5.9*  ALBUMIN 2.8*   No results for input(s): LIPASE, AMYLASE in the last 168 hours. No results for input(s): AMMONIA in the last 168 hours. Coagulation Profile: Recent Labs  Lab 11/04/19 0330  INR 1.1   Cardiac Enzymes: No results for input(s): CKTOTAL, CKMB, CKMBINDEX, TROPONINI in the last 168 hours. BNP (last 3 results) No results for input(s): PROBNP in the last 8760 hours. HbA1C: No results for input(s): HGBA1C in the last 72 hours. CBG: Recent Labs  Lab 11/05/19 1644 11/05/19 2009 11/06/19 0003 11/06/19 0354 11/06/19 0816  GLUCAP 118* 156* 117* 123* 77   Lipid Profile: No results for input(s): CHOL, HDL, LDLCALC, TRIG, CHOLHDL, LDLDIRECT in the last 72 hours. Thyroid Function Tests: No results for input(s): TSH, T4TOTAL, FREET4, T3FREE, THYROIDAB in the last 72 hours. Anemia Panel: No results for input(s): VITAMINB12, FOLATE, FERRITIN, TIBC, IRON, RETICCTPCT in the last 72 hours. Urine analysis:    Component Value Date/Time   COLORURINE YELLOW 11/04/2019 0152   APPEARANCEUR HAZY (A) 11/04/2019 0152   LABSPEC 1.008 11/04/2019 0152   PHURINE 5.0 11/04/2019 0152   GLUCOSEU NEGATIVE 11/04/2019 0152   HGBUR SMALL (A) 11/04/2019 0152   BILIRUBINUR NEGATIVE 11/04/2019 0152   KETONESUR 5 (A) 11/04/2019 0152   PROTEINUR NEGATIVE 11/04/2019 0152   UROBILINOGEN 0.2 09/11/2014 2309   NITRITE NEGATIVE 11/04/2019 0152   LEUKOCYTESUR LARGE (A) 11/04/2019 0152     Lilias Lorensen M.D. [facility] 11/06/2019, 9:07 AM   Call night coverage person covering after 7pm

## 2019-11-07 LAB — GLUCOSE, CAPILLARY
Glucose-Capillary: 121 mg/dL — ABNORMAL HIGH (ref 70–99)
Glucose-Capillary: 122 mg/dL — ABNORMAL HIGH (ref 70–99)
Glucose-Capillary: 130 mg/dL — ABNORMAL HIGH (ref 70–99)
Glucose-Capillary: 133 mg/dL — ABNORMAL HIGH (ref 70–99)
Glucose-Capillary: 139 mg/dL — ABNORMAL HIGH (ref 70–99)
Glucose-Capillary: 145 mg/dL — ABNORMAL HIGH (ref 70–99)
Glucose-Capillary: 161 mg/dL — ABNORMAL HIGH (ref 70–99)

## 2019-11-07 LAB — BASIC METABOLIC PANEL
Anion gap: 10 (ref 5–15)
BUN: 9 mg/dL (ref 8–23)
CO2: 28 mmol/L (ref 22–32)
Calcium: 7.8 mg/dL — ABNORMAL LOW (ref 8.9–10.3)
Chloride: 99 mmol/L (ref 98–111)
Creatinine, Ser: 0.76 mg/dL (ref 0.44–1.00)
GFR calc Af Amer: >60 mL/min (ref >60)
GFR calc non Af Amer: >60 mL/min (ref >60)
Glucose, Bld: 136 mg/dL — ABNORMAL HIGH (ref 70–99)
Potassium: 3.5 mmol/L (ref 3.5–5.1)
Sodium: 137 mmol/L (ref 135–145)

## 2019-11-07 LAB — CALCIUM, IONIZED: Calcium, Ionized, Serum: 4.8 mg/dL (ref 4.5–5.6)

## 2019-11-07 LAB — CBC
HCT: 38.9 % (ref 36.0–46.0)
Hemoglobin: 12.6 g/dL (ref 12.0–15.0)
MCH: 31.7 pg (ref 26.0–34.0)
MCHC: 32.4 g/dL (ref 30.0–36.0)
MCV: 98 fL (ref 80.0–100.0)
Platelets: 152 10*3/uL (ref 150–400)
RBC: 3.97 MIL/uL (ref 3.87–5.11)
RDW: 14.4 % (ref 11.5–15.5)
WBC: 9.7 10*3/uL (ref 4.0–10.5)
nRBC: 0 % (ref 0.0–0.2)

## 2019-11-07 LAB — TROPONIN I (HIGH SENSITIVITY)
Troponin I (High Sensitivity): 54 ng/L — ABNORMAL HIGH (ref <18)
Troponin I (High Sensitivity): 48 ng/L — ABNORMAL HIGH (ref <18)

## 2019-11-07 MED ORDER — ENOXAPARIN SODIUM 40 MG/0.4ML ~~LOC~~ SOLN: 40.0000 mg | SUBCUTANEOUS | 0 refills | Status: DC

## 2019-11-07 MED ORDER — ADULT MULTIVITAMIN W/MINERALS CH: 1.0000 | ORAL_TABLET | Freq: Every day | ORAL | 5 refills | Status: AC

## 2019-11-07 MED ORDER — OXYCODONE HCL 5 MG PO TABS: 5.0000 mg | ORAL_TABLET | Freq: Four times a day (QID) | ORAL | 0 refills | Status: DC | PRN

## 2019-11-07 MED ORDER — AMOXICILLIN-POT CLAVULANATE 875-125 MG PO TABS
1.0000 | ORAL_TABLET | Freq: Two times a day (BID) | ORAL | Status: DC
  Administered 2019-11-07 – 2019-11-09 (×4): 1 via ORAL
  Filled 2019-11-07 (×4): qty 1

## 2019-11-07 MED ORDER — TRAMADOL HCL 50 MG PO TABS: 50.0000 mg | ORAL_TABLET | Freq: Three times a day (TID) | ORAL | 0 refills | Status: DC | PRN

## 2019-11-07 MED ORDER — VITAMIN D3 25 MCG PO TABS: 2000.0000 [IU] | ORAL_TABLET | Freq: Two times a day (BID) | ORAL | 2 refills | Status: AC

## 2019-11-07 MED ORDER — ASCORBIC ACID 500 MG PO TABS: 500.0000 mg | ORAL_TABLET | Freq: Every day | ORAL | 1 refills | Status: AC

## 2019-11-07 NOTE — TOC Transition Note (Signed)
Transition of Care [facility]) - CM/SW Discharge Note   Patient Details  Name: [name] MRN: [id number] Date of Birth: 22-Apr-1941  Transition of Care [facility]) CM/SW Contact:  Sharin Mons, RN Phone Number: [phone] 11/07/2019, 4:35 PM   Clinical Narrative:    NCM received SNF authorization approval  x 5 days,  [id number]  from [place], reference [id number]. Lester Robins Bland, [phone]. NCM made [place]/ [facility] aware and MD. [facility] plans to accept pt on tomorrow. Pt needs a 24hr CIWA score of "0" and updated COVID in order to d/c to [facility] on tomorrow. MD made aware.  Final next level of care: Skilled Nursing Facility Barriers to Discharge: No Barriers Identified   Patient Goals and CMS Choice        Discharge Placement                       Discharge Plan and Services                                     Social Determinants of Health (SDOH) Interventions     Readmission Risk Interventions No flowsheet data found.

## 2019-11-07 NOTE — Progress Notes (Signed)
PROGRESS NOTE    [name]  [id number] DOB: 04-20-1941 DOA: 11/04/2019 PCP: Josetta Huddle, MD    Brief Narrative:  [phone] with hx ETOH abuse, arthirits, CVA, HTN, Afib presented with fall, resulting in R hip fracture.  Orthopedics was consulted, underwent hemiarthroplasty of the right hip on 3/23  Assessment & Plan:   Principal Problem:   Closed displaced fracture of right femoral neck ([place]) Active Problems:   Vitamin D insufficiency   Osteoporosis with fracture   Syncope   UTI (urinary tract infection)   Hypoxia   Hypokalemia   Principal Problem: Acute right hip fracture (HCC) -Status post hemiarthroplasty right hip on 3/23, postop day #2 -Weightbearing as tolerated per orthopedics, started PT, recommended SNF -Continue pain control, DVT prophylaxis per orthopedics -COVID-19 was negative on 3/23. Have repeated COVID test for SNF placement, pending  Active Problems:   Syncope -Likely vasovagal: 3/24 showed EF of 55 to 60%, normal left ventricular function, no aortic stenosis -CT head unremarkable, EEG normal -D-dimer 1.27, obtain venous Dopplers of the lower extremity, negative -Chest CTA neg for PE, however pos for B consolidations and pleural effusions. See below  Elevated d-dimer - venous dopplers neg for DVT, CTA neg for PE but shows b/l consolidations, pleural effusions  PNA -See above, B consolidations noted on CT chest -Initially continued on rocephin with amoxicillin already given for below UTI -Have narrowed abx to augmentin -Now afebrile and on minimal O2 support  Vitamin D deficiency -Vitamin D 24.86, continue daily replacement   Enterococcus UTI (urinary tract infection) -Urine culture showed Enterococcus -Was on ampicillin per sensitivities -Now afebrile  Protein calorie malnutrition Estimated body mass index is 17.49 kg/m as calculated from the following:   Height as of this encounter: 5\' 10"  (1.778 m).   Weight as of this  encounter: 55.3 kg.  Registered dietitian consult  History of alcohol use -Continue CIWA protocol with Ativan -No evidence of withdrawals while in hospital  DVT prophylaxis: Lovenox subq Code Status: Full Family Communication: Pt in room, family not at bedside Disposition Plan: from home, plan d/c SNF when COVID test returns neg  Consultants:   Orthopedic Surgery  Procedures:  UNIPOLAR HEMIARTHROPLASTY OF THERIGHTHIP   Antimicrobials: Anti-infectives (From admission, onward)   Start     Dose/Rate Route Frequency Ordered Stop   11/07/19 1700  amoxicillin-clavulanate (AUGMENTIN) 875-125 MG per tablet 1 tablet     1 tablet Oral 2 times daily with meals 11/07/19 1320 11/11/19 0759   11/06/19 1930  cefTRIAXone (ROCEPHIN) 2 g in sodium chloride 0.9 % 100 mL IVPB  Status:  Discontinued     2 g 200 mL/hr over 30 Minutes Intravenous Every 24 hours 11/06/19 1801 11/07/19 1320   11/05/19 1800  ampicillin (OMNIPEN) 1 g in sodium chloride 0.9 % 100 mL IVPB  Status:  Discontinued     1 g 300 mL/hr over 20 Minutes Intravenous Every 6 hours 11/05/19 1609 11/07/19 1320   11/04/19 2200  cefTRIAXone (ROCEPHIN) 1 g in sodium chloride 0.9 % 100 mL IVPB  Status:  Discontinued     1 g 200 mL/hr over 30 Minutes Intravenous Every 24 hours 11/04/19 0540 11/05/19 1609   11/04/19 1430  cefTRIAXone (ROCEPHIN) 1 g in sodium chloride 0.9 % 100 mL IVPB  Status:  Discontinued     1 g 200 mL/hr over 30 Minutes Intravenous  Once 11/04/19 1425 11/04/19 2003   11/04/19 1416  ceFAZolin (ANCEF) 2-4 GM/100ML-% IVPB    Note to  Pharmacy: Grace Blight   : cabinet override      11/04/19 1416 11/05/19 0229   11/04/19 0300  cefTRIAXone (ROCEPHIN) 1 g in sodium chloride 0.9 % 100 mL IVPB     1 g 200 mL/hr over 30 Minutes Intravenous  Once 11/04/19 0256 11/04/19 0353       Subjective: Eager to start therapy soon  Objective: Vitals:   11/06/19 2300 11/07/19 0326 11/07/19 0748 11/07/19 1700  BP:  (!) 129/58  (!) 143/65 (!) 151/57  Pulse: 86 81 86 94  Resp: 17 17 16 17   Temp:  97.8 F (36.6 C) 99 F (37.2 C) 99.6 F (37.6 C)  TempSrc:  Oral Oral Oral  SpO2: 95% 99% 98% 95%  Weight:      Height:        Intake/Output Summary (Last 24 hours) at 11/07/2019 1814 Last data filed at 11/07/2019 1000 Gross per 24 hour  Intake 480 ml  Output --  Net 480 ml   Filed Weights   11/04/19 0152 11/04/19 1213  Weight: 55.3 kg 55.3 kg    Examination: General exam: Awake, laying in bed, in nad Respiratory system: Normal respiratory effort, no wheezing Cardiovascular system: regular rate, s1, s2 Gastrointestinal system: Soft, nondistended, positive BS Central nervous system: CN2-12 grossly intact, strength intact Extremities: Perfused, no clubbing Skin: Normal skin turgor, no notable skin lesions seen Psychiatry: Mood normal // no visual hallucinations   Data Reviewed: I have personally reviewed following labs and imaging studies  CBC: Recent Labs  Lab 11/04/19 0330 11/04/19 1634 11/05/19 0547 11/06/19 0621 11/07/19 0123  WBC 11.6*  --  9.1 10.6* 9.7  NEUTROABS 10.1*  --   --   --   --   HGB 14.4 15.0 14.0 13.0 12.6  HCT 45.4 44.0 43.7 40.1 38.9  MCV 99.8  --  98.9 99.0 98.0  PLT 162  --  147* 149* 0000000   Basic Metabolic Panel: Recent Labs  Lab 11/04/19 0330 11/04/19 1352 11/04/19 1634 11/05/19 0547 11/07/19 0123  NA 136 132* 134* 138 137  K 2.9* 4.1 3.5 4.0 3.5  CL 99 103 99 107 99  CO2 22 11*  --  22 28  GLUCOSE 150* 67* 187* 190* 136*  BUN 8 5* 7* 6* 9  CREATININE 0.85 <0.30* 0.70 0.91 0.76  CALCIUM 7.9* 6.5*  --  8.1* 7.8*  MG 1.8  --   --   --   --   PHOS 3.3  --   --   --   --    GFR: Estimated Creatinine Clearance: 49.8 mL/min (by C-G formula based on SCr of 0.76 mg/dL). Liver Function Tests: Recent Labs  Lab 11/04/19 0330  AST 22  ALT 15  ALKPHOS 71  BILITOT 0.8  PROT 5.9*  ALBUMIN 2.8*   No results for input(s): LIPASE, AMYLASE in the last 168  hours. No results for input(s): AMMONIA in the last 168 hours. Coagulation Profile: Recent Labs  Lab 11/04/19 0330  INR 1.1   Cardiac Enzymes: No results for input(s): CKTOTAL, CKMB, CKMBINDEX, TROPONINI in the last 168 hours. BNP (last 3 results) No results for input(s): PROBNP in the last 8760 hours. HbA1C: No results for input(s): HGBA1C in the last 72 hours. CBG: Recent Labs  Lab 11/07/19 0004 11/07/19 0348 11/07/19 0749 11/07/19 1131 11/07/19 1717  GLUCAP 130* 133* 121* 145* 161*   Lipid Profile: No results for input(s): CHOL, HDL, LDLCALC, TRIG, CHOLHDL, LDLDIRECT in the last 72  hours. Thyroid Function Tests: No results for input(s): TSH, T4TOTAL, FREET4, T3FREE, THYROIDAB in the last 72 hours. Anemia Panel: No results for input(s): VITAMINB12, FOLATE, FERRITIN, TIBC, IRON, RETICCTPCT in the last 72 hours. Sepsis Labs: No results for input(s): PROCALCITON, LATICACIDVEN in the last 168 hours.  Recent Results (from the past 240 hour(s))  Urine culture     Status: Abnormal   Collection Time: 11/04/19  2:56 AM   Specimen: Urine, Catheterized  Result Value Ref Range Status   Specimen Description   Final    URINE, CATHETERIZED Performed at [facility] [address] [place], [place] [address]    Special Requests   Final    Normal Performed at [facility], [place] [address] [place], [place] [address]    Culture >=100,000 COLONIES/mL ENTEROCOCCUS FAECALIS (A)  Final   Report Status 11/05/2019 FINAL  Final   Organism ID, Bacteria ENTEROCOCCUS FAECALIS (A)  Final      Susceptibility   Enterococcus faecalis - MIC*    AMPICILLIN <=2 SENSITIVE Sensitive     NITROFURANTOIN <=16 SENSITIVE Sensitive     VANCOMYCIN 1 SENSITIVE Sensitive     * >=100,000 COLONIES/mL ENTEROCOCCUS FAECALIS  Respiratory Panel by RT PCR (Flu A&B, Covid) - Nasopharyngeal Swab     Status: None   Collection Time: 11/04/19  4:44 AM   Specimen: Nasopharyngeal  Swab  Result Value Ref Range Status   SARS Coronavirus 2 by RT PCR NEGATIVE NEGATIVE Final    Comment: (NOTE) SARS-CoV-2 target nucleic acids are NOT DETECTED. The SARS-CoV-2 RNA is generally detectable in upper respiratoy specimens during the acute phase of infection. The lowest concentration of SARS-CoV-2 viral copies this assay can detect is 131 copies/mL. A negative result does not preclude SARS-Cov-2 infection and should not be used as the sole basis for treatment or other patient management decisions. A negative result may occur with  improper specimen collection/handling, submission of specimen other than nasopharyngeal swab, presence of viral mutation(s) within the areas targeted by this assay, and inadequate number of viral copies (<131 copies/mL). A negative result must be combined with clinical observations, patient history, and epidemiological information. The expected result is Negative. Fact Sheet for Patients:  PinkCheek.be Fact Sheet for Healthcare Providers:  GravelBags.it This test is not yet ap proved or cleared by the [place] FDA and  has been authorized for detection and/or diagnosis of SARS-CoV-2 by FDA under an Emergency Use Authorization (EUA). This EUA will remain  in effect (meaning this test can be used) for the duration of the COVID-19 declaration under Section 564(b)(1) of the Act, 21 U.S.C. section 360bbb-3(b)(1), unless the authorization is terminated or revoked sooner.    Influenza A by PCR NEGATIVE NEGATIVE Final   Influenza B by PCR NEGATIVE NEGATIVE Final    Comment: (NOTE) The Xpert Xpress SARS-CoV-2/FLU/RSV assay is intended as an aid in  the diagnosis of influenza from Nasopharyngeal swab specimens and  should not be used as a sole basis for treatment. Nasal washings and  aspirates are unacceptable for Xpert Xpress SARS-CoV-2/FLU/RSV  testing. Fact Sheet for  Patients: PinkCheek.be Fact Sheet for Healthcare Providers: GravelBags.it This test is not yet approved or cleared by the [place] FDA and  has been authorized for detection and/or diagnosis of SARS-CoV-2 by  FDA under an Emergency Use Authorization (EUA). This EUA will remain  in effect (meaning this test can be used) for the duration of the  Covid-19 declaration under Section 564(b)(1) of  the Act, 21  U.S.C. section 360bbb-3(b)(1), unless the authorization is  terminated or revoked. Performed at [facility], [place] [address] [place], [place] [address]   Surgical pcr screen     Status: None   Collection Time: 11/04/19  8:46 AM   Specimen: Nasal Mucosa; Nasal Swab  Result Value Ref Range Status   MRSA, PCR NEGATIVE NEGATIVE Final   Staphylococcus aureus NEGATIVE NEGATIVE Final    Comment: (NOTE) The Xpert SA Assay (FDA approved for NASAL specimens in patients 56 years of age and older), is one component of a comprehensive surveillance program. It is not intended to diagnose infection nor to guide or monitor treatment. Performed at [facility], [place] [address], [place], [place] [address]      Radiology Studies: CT ANGIO CHEST PE W OR WO CONTRAST  Result Date: 11/06/2019 CLINICAL DATA:  [place]. Syncopal episode. Elevated D-dimer. Shortness of breath. EXAM: CT ANGIOGRAPHY CHEST WITH CONTRAST TECHNIQUE: Multidetector CT imaging of the chest was performed using the standard protocol during bolus administration of intravenous contrast. Multiplanar CT image reconstructions and MIPs were obtained to evaluate the vascular anatomy. CONTRAST:  167mL OMNIPAQUE IOHEXOL 350 MG/ML SOLN COMPARISON:  07/18/2018 FINDINGS: Cardiovascular: The heart is upper limits of normal in size and stable. There is mild tortuosity, ectasia and calcification of the thoracic aorta but no focal aneurysm or dissection. The branch  vessels are patent. Three-vessel coronary artery calcifications are stable. The pulmonary arterial tree is well opacified. No filling defects to suggest pulmonary embolism. Mediastinum/Nodes: Scattered mediastinal and hilar lymph nodes are stable. No mass or overt adenopathy the. The esophagus is grossly normal. Lungs/Pleura: Small bilateral pleural effusions are noted along with dense bilateral lower lobe airspace consolidation, likely pneumonia. No pulmonary edema. No worrisome pulmonary lesions. Upper Abdomen: No significant upper abdominal findings. Stable hepatic cysts. Musculoskeletal: Bilateral mastectomy defects. No chest wall mass, supraclavicular or axillary adenopathy. The bony thorax is intact. No worrisome bone lesions. Review of the MIP images confirms the above findings. IMPRESSION: 1. No CT findings for pulmonary embolism. 2. Stable atherosclerotic calcifications involving the thoracic aorta but no focal aneurysm or dissection. 3. Stable three-vessel coronary artery calcifications. 4. Small bilateral pleural effusions and dense bilateral lower lobe airspace consolidation, likely pneumonia. Aortic Atherosclerosis (ICD10-I70.0). Aortic Atherosclerosis (ICD10-I70.0). Electronically Signed   By: Marijo Sanes M.D.   On: 11/06/2019 17:40   EEG adult  Result Date: 11/06/2019 Lora Havens, MD     11/06/2019  9:39 AM Patient Name: Christlyn Ammar MRN: [id number] Epilepsy Attending: Lora Havens Referring Physician/Provider: Ainsley Spinner, PA Date: 11/05/2019 Duration: 23.22 minutes Patient history: 79 year old female presented after syncope.  EEG to evaluate for seizures. Level of alertness: Awake AEDs during EEG study: Lorazepam Technical aspects: This EEG study was done with scalp electrodes positioned according to the 10-20 International system of electrode placement. Electrical activity was acquired at a sampling rate of 500Hz  and reviewed with a high frequency filter of 70Hz  and a low frequency  filter of 1Hz . EEG data were recorded continuously and digitally stored. Description: The posterior dominant rhythm consists of 8 Hz activity of moderate voltage (25-35 uV) seen predominantly in posterior head regions, symmetric and reactive to eye opening and eye closing. Hyperventilation and photic stimulation were not performed. IMPRESSION: This study is within normal limits. No seizures or epileptiform discharges were seen throughout the recording. Priyanka O Yadav   VAS [place] LOWER EXTREMITY VENOUS (DVT)  Result Date: 11/06/2019  Lower Venous DVT Study Indications: Elevated Ddimer.  Risk Factors: None identified. Comparison Study: No prior studies. Performing Technologist: Oliver Hum RVT  Examination Guidelines: A complete evaluation includes B-mode imaging, spectral Doppler, color Doppler, and power Doppler as needed of all accessible portions of each vessel. Bilateral testing is considered an integral part of a complete examination. Limited examinations for reoccurring indications may be performed as noted. The reflux portion of the exam is performed with the patient in reverse Trendelenburg.  +---------+---------------+---------+-----------+----------+--------------+ RIGHT    CompressibilityPhasicitySpontaneityPropertiesThrombus Aging +---------+---------------+---------+-----------+----------+--------------+ CFV      Full           Yes      Yes                                 +---------+---------------+---------+-----------+----------+--------------+ SFJ      Full                                                        +---------+---------------+---------+-----------+----------+--------------+ FV Prox  Full                                                        +---------+---------------+---------+-----------+----------+--------------+ FV Mid   Full                                                         +---------+---------------+---------+-----------+----------+--------------+ FV DistalFull                                                        +---------+---------------+---------+-----------+----------+--------------+ PFV      Full                                                        +---------+---------------+---------+-----------+----------+--------------+ POP      Full           Yes      Yes                                 +---------+---------------+---------+-----------+----------+--------------+ PTV      Full                                                        +---------+---------------+---------+-----------+----------+--------------+ PERO     Full                                                        +---------+---------------+---------+-----------+----------+--------------+   +---------+---------------+---------+-----------+----------+--------------+  LEFT     CompressibilityPhasicitySpontaneityPropertiesThrombus Aging +---------+---------------+---------+-----------+----------+--------------+ CFV      Full           Yes      Yes                                 +---------+---------------+---------+-----------+----------+--------------+ SFJ      Full                                                        +---------+---------------+---------+-----------+----------+--------------+ FV Prox  Full                                                        +---------+---------------+---------+-----------+----------+--------------+ FV Mid   Full                                                        +---------+---------------+---------+-----------+----------+--------------+ FV DistalFull                                                        +---------+---------------+---------+-----------+----------+--------------+ PFV      Full                                                         +---------+---------------+---------+-----------+----------+--------------+ POP      Full           Yes      Yes                                 +---------+---------------+---------+-----------+----------+--------------+ PTV      Full                                                        +---------+---------------+---------+-----------+----------+--------------+ PERO     Full                                                        +---------+---------------+---------+-----------+----------+--------------+     Summary: RIGHT: - There is no evidence of deep vein thrombosis in the lower extremity.  - No cystic structure found in the popliteal fossa.  LEFT: - There is no evidence of deep vein thrombosis in the lower extremity.  - No  cystic structure found in the popliteal fossa.  *See table(s) above for measurements and observations. Electronically signed by Servando Snare MD on 11/06/2019 at 4:39:29 PM.    Final     Scheduled Meds: . amoxicillin-clavulanate  1 tablet Oral BID WC  . vitamin C  500 mg Oral Daily  . Chlorhexidine Gluconate Cloth  6 each Topical Daily  . cholecalciferol  2,000 Units Oral BID  . docusate sodium  100 mg Oral BID  . enoxaparin (LOVENOX) injection  40 mg Subcutaneous Q24H  . folic acid  1 mg Oral Daily  . multivitamin with minerals  1 tablet Oral Daily  . pantoprazole  40 mg Oral Daily  . polyethylene glycol  17 g Oral Daily  . sodium chloride flush  10-40 mL Intracatheter Q12H  . thiamine  100 mg Oral Daily  . traMADol  50 mg Oral Q12H   Continuous Infusions:   LOS: 3 days   Marylu Lund, MD [facility] Pager On Amion  If 7PM-7AM, please contact night-coverage 11/07/2019, 6:14 PM

## 2019-11-07 NOTE — NC FL2 (Addendum)
[address] LEVEL OF CARE SCREENING TOOL     IDENTIFICATION  Patient Name: [name] Birthdate: 1940-11-07 Sex: female Admission Date (Current Location): 11/04/2019  [facility] and [place] Number:  Herbalist and Address:  [facility] [place], [place] [address], [place], [place] [address]      Provider Number: [id number]  Attending Physician Name and Address:  Donne Hazel, MD  Relative Name and Phone Number:       Current Level of Care: Hospital Recommended Level of Care: [place] Prior Approval Number:    Date Approved/Denied:   PASRR Number: [id number]  Discharge Plan: SNF    Current Diagnoses: Patient Active Problem List   Diagnosis Date Noted  . Closed displaced fracture of right femoral neck ([place]) 11/04/2019  . Hypokalemia 11/04/2019  . Palpitations 11/19/2017  . Atypical chest pain 11/19/2017  . Pleuritic chest pain 10/09/2017  . Hypoxia 10/09/2017  . Appendicitis with perforation   . Acute appendicitis 10/06/2017  . Fall 04/10/2017  . UTI (urinary tract infection) 04/10/2017  . Accelerated hypertension 04/10/2017  . Malignant neoplasm of upper-outer quadrant of right breast in female, estrogen receptor positive ([place]) 01/07/2016  . Stroke ([place]) 06/05/2014  . Alcohol dependence ([place]) 06/05/2014  . Dyslipidemia 06/05/2014  . Syncope 06/04/2014  . Postoperative anemia due to acute blood loss 12/19/2012  . Osteoporosis 12/19/2012  . Osteoporosis with fracture 12/19/2012  . Hip fracture, left ([place]) 12/16/2012  . Gout 12/16/2012  . fracture of fifth right metatarsal bone 12/16/2012  . Alcohol abuse 12/16/2012  . GERD (gastroesophageal reflux disease) 12/16/2012  . PAF (paroxysmal atrial fibrillation) ([place]) 12/16/2012  . B12 deficiency 12/16/2012  . Vitamin D insufficiency 12/16/2012  . Esophageal stricture 12/16/2012  . Other and unspecified hyperlipidemia 12/16/2012  . Reactive depression  (situational) 12/16/2012  . Insomnia 12/16/2012  . H/O: CVA (cerebrovascular accident) 12/16/2012  . Dupuytren's contracture of both hands 12/16/2012  . Fibrocystic breast disease 12/16/2012    Orientation RESPIRATION BLADDER Height & Weight     Self, Time, Place  Normal Continent Weight: 55.3 kg Height:  5\' 10"  (177.8 cm)  BEHAVIORAL SYMPTOMS/MOOD NEUROLOGICAL BOWEL NUTRITION STATUS      Continent Diet(refer to d/c summary)  AMBULATORY STATUS COMMUNICATION OF NEEDS Skin   Extensive Assist Verbally Other (Comment)(s/p (HEMIARTHROPLASTY) (Right Hip, 9/23)                       Personal Care Assistance Level of Assistance  Bathing, Dressing, Feeding Bathing Assistance: Maximum assistance Feeding assistance: Independent Dressing Assistance: Maximum assistance     Functional Limitations Info  Sight, Hearing, Speech Sight Info: Adequate Hearing Info: Adequate Speech Info: Adequate    SPECIAL CARE FACTORS FREQUENCY  PT (By licensed PT), OT (By licensed OT)     PT Frequency: 5x/week,  evaluate and treat OT Frequency: 5x/week,  evaluate and treat            Contractures Contractures Info: Not present    Additional Factors Info  Code Status, Allergies Code Status Info: full code Allergies Info: Vicodin Hydrocodone-acetaminophen, Ambien Zolpidem Tartrate           Current Medications (11/07/2019):  This is the current hospital active medication list Current Facility-Administered Medications  Medication Dose Route Frequency Provider Last Rate Last Admin  . acetaminophen (TYLENOL) tablet 325-650 mg  325-650 mg Oral Q6H PRN Ainsley Spinner, PA-C   650 mg at 11/06/19 1057  .  alum & mag hydroxide-simeth (MAALOX/MYLANTA) 200-200-20 MG/5ML suspension 30 mL  30 mL Oral Q4H PRN Ainsley Spinner, PA-C      . ampicillin (OMNIPEN) 1 g in sodium chloride 0.9 % 100 mL IVPB  1 g Intravenous Q6H Donne Hazel, MD 300 mL/hr at 11/07/19 0504 1 g at 11/07/19 0504  . ascorbic acid (VITAMIN  C) tablet 500 mg  500 mg Oral Daily Ainsley Spinner, PA-C   500 mg at 11/06/19 0932  . cefTRIAXone (ROCEPHIN) 2 g in sodium chloride 0.9 % 100 mL IVPB  2 g Intravenous Q24H Rai, Ripudeep K, MD 200 mL/hr at 11/06/19 1823 2 g at 11/06/19 1823  . Chlorhexidine Gluconate Cloth 2 % PADS 6 each  6 each Topical Daily Altamese Shafer, MD   6 each at 11/06/19 [phone]  . cholecalciferol (VITAMIN D3) tablet 2,000 Units  2,000 Units Oral BID Ainsley Spinner, PA-C   2,000 Units at 11/06/19 2117  . docusate sodium (COLACE) capsule 100 mg  100 mg Oral BID Ainsley Spinner, PA-C   100 mg at 11/06/19 2116  . enoxaparin (LOVENOX) injection 40 mg  40 mg Subcutaneous Q24H Ainsley Spinner, PA-C   40 mg at 11/06/19 [id number]  . fentaNYL (SUBLIMAZE) injection 12.5 mcg  12.5 mcg Intravenous Q4H PRN Rai, Ripudeep K, MD      . folic acid (FOLVITE) tablet 1 mg  1 mg Oral Daily Ainsley Spinner, PA-C   1 mg at 11/06/19 0932  . HYDROmorphone (DILAUDID) injection 0.5-1 mg  0.5-1 mg Intravenous Q4H PRN Ainsley Spinner, PA-C      . ipratropium-albuterol (DUONEB) 0.5-2.5 (3) MG/3ML nebulizer solution 3 mL  3 mL Nebulization Q4H PRN Rai, Ripudeep K, MD      . menthol-cetylpyridinium (CEPACOL) lozenge 3 mg  1 lozenge Oral PRN Ainsley Spinner, PA-C       Or  . phenol (CHLORASEPTIC) mouth spray 1 spray  1 spray Mouth/Throat PRN Ainsley Spinner, PA-C      . metoCLOPramide (REGLAN) tablet 5-10 mg  5-10 mg Oral Q8H PRN Ainsley Spinner, PA-C       Or  . metoCLOPramide (REGLAN) injection 5-10 mg  5-10 mg Intravenous Q8H PRN Ainsley Spinner, PA-C      . multivitamin with minerals tablet 1 tablet  1 tablet Oral Daily Ainsley Spinner, PA-C   1 tablet at 11/06/19 0932  . nitroGLYCERIN (NITROSTAT) SL tablet 0.4 mg  0.4 mg Sublingual Q5 min PRN Hollace Hayward K, NP   0.4 mg at 11/06/19 1959  . ondansetron (ZOFRAN) tablet 4 mg  4 mg Oral Q6H PRN Ainsley Spinner, PA-C       Or  . ondansetron [facility]) injection 4 mg  4 mg Intravenous Q6H PRN Ainsley Spinner, PA-C      . oxyCODONE (Oxy IR/ROXICODONE)  immediate release tablet 5 mg  5 mg Oral Q4H PRN Rai, Ripudeep K, MD   5 mg at 11/07/19 0504  . pantoprazole (PROTONIX) EC tablet 40 mg  40 mg Oral Daily Ainsley Spinner, PA-C   40 mg at 11/06/19 0932  . polyethylene glycol (MIRALAX / GLYCOLAX) packet 17 g  17 g Oral Daily Ainsley Spinner, PA-C   17 g at 11/06/19 0931  . sodium chloride flush (NS) 0.9 % injection 10-40 mL  10-40 mL Intracatheter Q12H Altamese Hammond, MD      . sodium chloride flush (NS) 0.9 % injection 10-40 mL  10-40 mL Intracatheter PRN Altamese Scott, MD      . thiamine tablet 100  mg  100 mg Oral Daily Ainsley Spinner, PA-C   100 mg at 11/06/19 0932  . traMADol (ULTRAM) tablet 50 mg  50 mg Oral Q12H Ainsley Spinner, PA-C   50 mg at 11/06/19 2117     Discharge Medications: Please see discharge summary for a list of discharge medications.  Relevant Imaging Results:  Relevant Lab Results:   Additional Information [id number]  Sharin Mons, RN

## 2019-11-07 NOTE — TOC Initial Note (Addendum)
Transition of Care [facility]) - Initial/Assessment Note    Patient Details  Name: [name] MRN: [id number] Date of Birth: 1940/11/20  [facility] CM/SW Contact:    Sharin Mons, RN Phone Number: 11/07/2019, 8:45 AM  Clinical Narrative:          Admitted s/p fall, suffered R femoral fx. From home with sonsChevis Pretty and Hankins). PTA indepentdent with ADL's , no DME usage.           - HEMIARTHROPLASTY OF THE RIGHT HIP,11/04/2019 NCM received consult for possible SNF placement at time of discharge. RNCM spoke with patient regarding PT recommendation of SNF placement at time of discharge. Patient reported that she is currently unable to care for self at home given he rcurrent physical needs and fall risk. Patient expressed understanding of PT recommendation and is agreeable to SNF placement at time of discharge. Patient reports preference for [facility].NCM discussed insurance authorization process and provided Medicare SNF ratings list. Patient expressed being hopeful for rehab and to feel better soon. No further questions reported at this time. NCM to continue to follow and assist with discharge planning needs.  Pt has given NCM the  ok to speak with sons if needed.  Chevis Pretty And Lucendia [name] * [facility] [place] [phone]     Expected Discharge Plan: Skilled Nursing Facility Barriers to Discharge: Continued Medical Work up   Patient Goals and CMS Choice        Expected Discharge Plan and Services Expected Discharge Plan: [place]      Prior Living Arrangements/Services       Activities of Daily Living Home Assistive Devices/Equipment: Other (Comment)(patient denies wearing dentures, glasses or using a walker or cane) ADL Screening (condition at time of admission) Patient's cognitive ability adequate to safely complete daily activities?: No Is the patient deaf or have difficulty hearing?: Yes(hoh) Does the patient  have difficulty seeing, even when wearing glasses/contacts?: No Does the patient have difficulty concentrating, remembering, or making decisions?: Yes Patient able to express need for assistance with ADLs?: No(when she is more alert) Does the patient have difficulty dressing or bathing?: Yes Independently performs ADLs?: No Communication: Independent Dressing (OT): Needs assistance Is this a change from baseline?: Change from baseline, expected to last >3 days Grooming: Needs assistance Is this a change from baseline?: Change from baseline, expected to last >3 days Feeding: Needs assistance Is this a change from baseline?: Change from baseline, expected to last >3 days Bathing: Needs assistance Is this a change from baseline?: Change from baseline, expected to last >3 days Toileting: Needs assistance Is this a change from baseline?: Change from baseline, expected to last >3days In/Out Bed: Needs assistance Is this a change from baseline?: Change from baseline, expected to last >3 days Walks in Home: Needs assistance Is this a change from baseline?: Change from baseline, expected to last >3 days Does the patient have difficulty walking or climbing stairs?: Yes Weakness of Legs: Right Weakness of Arms/Hands: Both  Permission Sought/Granted                  Emotional Assessment              Admission diagnosis:  Hypokalemia [E87.6] Hip fracture ([place]) [S72.009A] Closed fracture of neck of right femur, initial encounter ([place]) [S72.001A] Fall in home, initial encounter [W19.XXXA, [id number] Urinary tract infection without hematuria, site unspecified [N39.0] Patient Active Problem List   Diagnosis Date Noted  .  Closed displaced fracture of right femoral neck ([place]) 11/04/2019  . Hypokalemia 11/04/2019  . Palpitations 11/19/2017  . Atypical chest pain 11/19/2017  . Pleuritic chest pain 10/09/2017  . Hypoxia 10/09/2017  . Appendicitis with perforation   . Acute appendicitis  10/06/2017  . Fall 04/10/2017  . UTI (urinary tract infection) 04/10/2017  . Accelerated hypertension 04/10/2017  . Malignant neoplasm of upper-outer quadrant of right breast in female, estrogen receptor positive ([place]) 01/07/2016  . Stroke ([place]) 06/05/2014  . Alcohol dependence ([place]) 06/05/2014  . Dyslipidemia 06/05/2014  . Syncope 06/04/2014  . Postoperative anemia due to acute blood loss 12/19/2012  . Osteoporosis 12/19/2012  . Osteoporosis with fracture 12/19/2012  . Hip fracture, left ([place]) 12/16/2012  . Gout 12/16/2012  . fracture of fifth right metatarsal bone 12/16/2012  . Alcohol abuse 12/16/2012  . GERD (gastroesophageal reflux disease) 12/16/2012  . PAF (paroxysmal atrial fibrillation) ([place]) 12/16/2012  . B12 deficiency 12/16/2012  . Vitamin D insufficiency 12/16/2012  . Esophageal stricture 12/16/2012  . Other and unspecified hyperlipidemia 12/16/2012  . Reactive depression (situational) 12/16/2012  . Insomnia 12/16/2012  . H/O: CVA (cerebrovascular accident) 12/16/2012  . Dupuytren's contracture of both hands 12/16/2012  . Fibrocystic breast disease 12/16/2012   PCP:  Josetta Huddle, MD Pharmacy:   [facility] [id number] [place], [place] [address] [place] [address] Phone: [phone] Fax: [place], [place] - [place] AT [facility] [place] [address] Phone: [phone] Fax: [phone]     Social Determinants of Health (SDOH) Interventions    Readmission Risk Interventions No flowsheet data found.

## 2019-11-07 NOTE — Plan of Care (Signed)

## 2019-11-07 NOTE — Progress Notes (Signed)
Report given to oncoming nurse to follow up on covid test.

## 2019-11-07 NOTE — Progress Notes (Signed)
[facility]. Notified that patient HR SVT 180's and VTach patient c/o right side chest pain. SL nitro 0.4 given and EKG was done CIWA score 6 1 mg po Ativan given. Follow up was text paged bp11/66 HR 86 O2 stats 95% 5 Liters. Will continue to monitor. Arthor Captain LPN

## 2019-11-07 NOTE — Progress Notes (Signed)
Orthopaedic Trauma Service Progress Note  Patient ID: [name] MRN: [id number] DOB/AGE: 1940/11/28 79 y.o.  Subjective:  Doing ok  Sore but not too much pain  Ortho issues stable  CT angio chest negative for PE. Suggestive of PNA B LEx U/S neg for DVT  ROS As above  Objective:   VITALS:   Vitals:   11/06/19 2121 11/06/19 2300 11/07/19 0326 11/07/19 0748  BP:   (!) 129/58 (!) 143/65  Pulse: 89 86 81 86  Resp:  17 17 16   Temp:   97.8 F (36.6 C) 99 F (37.2 C)  TempSrc:   Oral Oral  SpO2:  95% 99% 98%  Weight:      Height:        Estimated body mass index is 17.49 kg/m as calculated from the following:   Height as of this encounter: 5\' 10"  (1.778 m).   Weight as of this encounter: 55.3 kg.   Intake/Output      03/25 0701 - 03/26 0700 03/26 0701 - 03/27 0700   P.O. 420    I.V. (mL/kg) 995.8 (18)    IV Piggyback 280.8    Total Intake(mL/kg) 1696.6 (30.7)    Urine (mL/kg/hr)     Total Output     Net +1696.6         Urine Occurrence 4 x    Stool Occurrence 1 x      LABS  Results for orders placed or performed during the hospital encounter of 11/04/19 (from the past 24 hour(s))  Glucose, capillary     Status: Abnormal   Collection Time: 11/06/19 11:32 AM  Result Value Ref Range   Glucose-Capillary 138 (H) 70 - 99 mg/dL  Glucose, capillary     Status: None   Collection Time: 11/06/19  4:19 PM  Result Value Ref Range   Glucose-Capillary 93 70 - 99 mg/dL  Brain natriuretic peptide     Status: Abnormal   Collection Time: 11/06/19  6:30 PM  Result Value Ref Range   B Natriuretic Peptide 157.3 (H) 0.0 - 100.0 pg/mL  Glucose, capillary     Status: Abnormal   Collection Time: 11/06/19  8:05 PM  Result Value Ref Range   Glucose-Capillary 130 (H) 70 - 99 mg/dL  Troponin I (High Sensitivity)     Status: Abnormal   Collection Time: 11/06/19  8:36 PM  Result Value Ref Range   Troponin I (High Sensitivity) 52 (H) <18 ng/L  Troponin I (High Sensitivity)     Status: Abnormal   Collection Time: 11/06/19 10:51 PM  Result Value Ref Range   Troponin I (High Sensitivity) 54 (H) <18 ng/L  Glucose, capillary     Status: Abnormal   Collection Time: 11/07/19 12:04 AM  Result Value Ref Range   Glucose-Capillary 130 (H) 70 - 99 mg/dL  CBC     Status: None   Collection Time: 11/07/19  1:23 AM  Result Value Ref Range   WBC 9.7 4.0 - 10.5 K/uL   RBC 3.97 3.87 - 5.11 MIL/uL   Hemoglobin 12.6 12.0 - 15.0 g/dL   HCT 38.9 36.0 - 46.0 %   MCV 98.0 80.0 - 100.0 fL   MCH 31.7 26.0 - 34.0 pg   MCHC 32.4 30.0 - 36.0 g/dL   RDW 14.4 11.5 -  15.5 %   Platelets 152 150 - 400 K/uL   nRBC 0.0 0.0 - 0.2 %  Basic metabolic panel     Status: Abnormal   Collection Time: 11/07/19  1:23 AM  Result Value Ref Range   Sodium 137 135 - 145 mmol/L   Potassium 3.5 3.5 - 5.1 mmol/L   Chloride 99 98 - 111 mmol/L   CO2 28 22 - 32 mmol/L   Glucose, Bld 136 (H) 70 - 99 mg/dL   BUN 9 8 - 23 mg/dL   Creatinine, Ser 0.76 0.44 - 1.00 mg/dL   Calcium 7.8 (L) 8.9 - 10.3 mg/dL   GFR calc non Af Amer >60 >60 mL/min   GFR calc Af Amer >60 >60 mL/min   Anion gap 10 5 - 15  Troponin I (High Sensitivity)     Status: Abnormal   Collection Time: 11/07/19  1:23 AM  Result Value Ref Range   Troponin I (High Sensitivity) 48 (H) <18 ng/L  Glucose, capillary     Status: Abnormal   Collection Time: 11/07/19  3:48 AM  Result Value Ref Range   Glucose-Capillary 133 (H) 70 - 99 mg/dL  Glucose, capillary     Status: Abnormal   Collection Time: 11/07/19  7:49 AM  Result Value Ref Range   Glucose-Capillary 121 (H) 70 - 99 mg/dL     PHYSICAL EXAM:   Gen: awake, resting comfortably in bed, NAD  Lungs: unlabored Cardiac: irreg, irreg, rate normal  Ext:       Right Lower extremity             Dressing R hip c/d/i   Dressing changed   Incision looks great   No active drainage   No signs of infection               Ext warm              + DP pulse             No DCT             DPN, SPN, TN sensation intact             EHL, FHL, AT, PT, peroneals, gastroc motor intact   Assessment/Plan: 3 Days Post-Op   Principal Problem:   Closed displaced fracture of right femoral neck (HCC) Active Problems:   Vitamin D insufficiency   Osteoporosis with fracture   Syncope   UTI (urinary tract infection)   Hypoxia   Hypokalemia   Anti-infectives (From admission, onward)   Start     Dose/Rate Route Frequency Ordered Stop   11/06/19 1930  cefTRIAXone (ROCEPHIN) 2 g in sodium chloride 0.9 % 100 mL IVPB     2 g 200 mL/hr over 30 Minutes Intravenous Every 24 hours 11/06/19 1801     11/05/19 1800  ampicillin (OMNIPEN) 1 g in sodium chloride 0.9 % 100 mL IVPB     1 g 300 mL/hr over 20 Minutes Intravenous Every 6 hours 11/05/19 1609     11/04/19 2200  cefTRIAXone (ROCEPHIN) 1 g in sodium chloride 0.9 % 100 mL IVPB  Status:  Discontinued     1 g 200 mL/hr over 30 Minutes Intravenous Every 24 hours 11/04/19 0540 11/05/19 1609   11/04/19 1430  cefTRIAXone (ROCEPHIN) 1 g in sodium chloride 0.9 % 100 mL IVPB  Status:  Discontinued     1 g 200 mL/hr over 30 Minutes Intravenous  Once 11/04/19 1425 11/04/19  2003   11/04/19 1416  ceFAZolin (ANCEF) 2-4 GM/100ML-% IVPB    Note to Pharmacy: Grace Blight   : cabinet override      11/04/19 1416 11/05/19 0229   11/04/19 0300  cefTRIAXone (ROCEPHIN) 1 g in sodium chloride 0.9 % 100 mL IVPB     1 g 200 mL/hr over 30 Minutes Intravenous  Once 11/04/19 0256 11/04/19 0353    .  POD/HD#: 66  79 y/o female s/p fall with R femoral neck fracture    -low energy fall    - R femoral neck fracture s/p R hip hemiarthroplasty              WBAT R leg with assistance             Posterior hip precautions                         Ok to dc abduction pillow              PT/OT             Ice as needed for swelling and pain control             Dressing changed  today   Change dressings as needed   Dc sutures in about 10 days      - Pain management:             Continue with current regimen              Minimize narcotics    - ABL anemia/Hemodynamics             stable               - Medical issues              Per primary    - DVT/PE prophylaxis:             lovenox x 28 days   - ID:              periop abx completed    - Metabolic Bone Disease:             vitamin d insufficiency                          Supplement              DEXA as outpt   - Activity:             Therapy    - FEN/GI prophylaxis/Foley/Lines:             Reg diet   - Dispo:             Therapy              SW consult for SNF  Ortho issues stable  Follow up with ortho in 10-14 days    Jari Pigg, PA-C [phone] (C) 11/07/2019, 8:40 AM  [facility] [place] [phone] (F)

## 2019-11-07 NOTE — Discharge Instructions (Signed)
Orthopaedic Trauma Service Discharge Instructions   General Discharge Instructions  Orthopaedic Injuries:  Right femoral neck fracture treated with right hip hemiarthroplasty   WEIGHT BEARING STATUS: weightbearing as tolerated with assistance   RANGE OF MOTION/ACTIVITY: posterior hip precautions right hip.  Activity as tolerate   Bone health:  Labs show vitamin d insufficiency. Continue with vitamin d supplementation.  Sustaining a femoral neck fracture is indicative of osteoporosis. Bone density scan in 4-8 weeks but would recommend pharmacologic intervention as you have sustained numerous fractures over the last several years   Wound Care: daily dressing changes starting on 11/09/2019. See below. Once wound is dry you may leave it open to the air    Discharge Wound Care Instructions  Do NOT apply any ointments, solutions or lotions to pin sites or surgical wounds.  These prevent needed drainage and even though solutions like hydrogen peroxide kill bacteria, they also damage cells lining the pin sites that help fight infection.  Applying lotions or ointments can keep the wounds moist and can cause them to breakdown and open up as well. This can increase the risk for infection. When in doubt call the office.  Surgical incisions should be dressed daily.  If any drainage is noted, use one layer of adaptic, then 4x4 gause and tape. Alternatively you can use a mepilex type dressing (dressing you have on from the hospital)  Once the incision is completely dry and without drainage, it may be left open to air out.  Showering may begin 36-48 hours later.  Cleaning gently with soap and water.  Traumatic wounds should be dressed daily as well.    One layer of adaptic, gauze, Kerlix, then ace wrap.  The adaptic can be discontinued once the draining has ceased    If you have a wet to dry dressing: wet the gauze with saline the squeeze as much saline out so the gauze is moist (not soaking wet),  place moistened gauze over wound, then place a dry gauze over the moist one, followed by Kerlix wrap, then ace wrap.   DVT/PE prophylaxis: Lovenox 40 mg subcutaneous injection daily x 28 days   Diet: as you were eating previously.  Can use over the counter stool softeners and bowel preparations, such as Miralax, to help with bowel movements.  Narcotics can be constipating.  Be sure to drink plenty of fluids  PAIN MEDICATION USE AND EXPECTATIONS  You have likely been given narcotic medications to help control your pain.  After a traumatic event that results in an fracture (broken bone) with or without surgery, it is ok to use narcotic pain medications to help control one's pain.  We understand that everyone responds to pain differently and each individual patient will be evaluated on a regular basis for the continued need for narcotic medications. Ideally, narcotic medication use should last no more than 6-8 weeks (coinciding with fracture healing).   As a patient it is your responsibility as well to monitor narcotic medication use and report the amount and frequency you use these medications when you come to your office visit.   We would also advise that if you are using narcotic medications, you should take a dose prior to therapy to maximize you participation.  IF YOU ARE ON NARCOTIC MEDICATIONS IT IS NOT PERMISSIBLE TO OPERATE A MOTOR VEHICLE (MOTORCYCLE/CAR/TRUCK/MOPED) OR HEAVY MACHINERY DO NOT MIX NARCOTICS WITH OTHER CNS (CENTRAL NERVOUS SYSTEM) DEPRESSANTS SUCH AS ALCOHOL   STOP SMOKING OR USING NICOTINE PRODUCTS!!!!  As  discussed nicotine severely impairs your body's ability to heal surgical and traumatic wounds but also impairs bone healing.  Wounds and bone heal by forming microscopic blood vessels (angiogenesis) and nicotine is a vasoconstrictor (essentially, shrinks blood vessels).  Therefore, if vasoconstriction occurs to these microscopic blood vessels they essentially disappear and  are unable to deliver necessary nutrients to the healing tissue.  This is one modifiable factor that you can do to dramatically increase your chances of healing your injury.    (This means no smoking, no nicotine gum, patches, etc)  DO NOT USE NONSTEROIDAL ANTI-INFLAMMATORY DRUGS (NSAID'S)  Using products such as Advil (ibuprofen), Aleve (naproxen), Motrin (ibuprofen) for additional pain control during fracture healing can delay and/or prevent the healing response.  If you would like to take over the counter (OTC) medication, Tylenol (acetaminophen) is ok.  However, some narcotic medications that are given for pain control contain acetaminophen as well. Therefore, you should not exceed more than 4000 mg of tylenol in a day if you do not have liver disease.  Also note that there are may OTC medicines, such as cold medicines and allergy medicines that my contain tylenol as well.  If you have any questions about medications and/or interactions please ask your doctor/PA or your pharmacist.      ICE AND ELEVATE INJURED/OPERATIVE EXTREMITY  Using ice and elevating the injured extremity above your heart can help with swelling and pain control.  Icing in a pulsatile fashion, such as 20 minutes on and 20 minutes off, can be followed.    Do not place ice directly on skin. Make sure there is a barrier between to skin and the ice pack.    Using frozen items such as frozen peas works well as the conform nicely to the are that needs to be iced.  USE AN ACE WRAP OR TED HOSE FOR SWELLING CONTROL  In addition to icing and elevation, Ace wraps or TED hose are used to help limit and resolve swelling.  It is recommended to use Ace wraps or TED hose until you are informed to stop.    When using Ace Wraps start the wrapping distally (farthest away from the body) and wrap proximally (closer to the body)   Example: If you had surgery on your leg or thing and you do not have a splint on, start the ace wrap at the toes and  work your way up to the thigh        If you had surgery on your upper extremity and do not have a splint on, start the ace wrap at your fingers and work your way up to the upper arm  IF YOU ARE IN A SPLINT OR CAST DO NOT [place]   If your splint gets wet for any reason please contact the office immediately. You may shower in your splint or cast as long as you keep it dry.  This can be done by wrapping in a cast cover or garbage back (or similar)  Do Not stick any thing down your splint or cast such as pencils, money, or hangers to try and scratch yourself with.  If you feel itchy take benadryl as prescribed on the bottle for itching  IF YOU ARE IN A CAM BOOT (BLACK BOOT)  You may remove boot periodically. Perform daily dressing changes as noted below.  Wash the liner of the boot regularly and wear a sock when wearing the boot. It is recommended that you sleep  in the boot until told otherwise    Call office for the following:  Temperature greater than 101F  Persistent nausea and vomiting  Severe uncontrolled pain  Redness, tenderness, or signs of infection (pain, swelling, redness, odor or green/yellow discharge around the site)  Difficulty breathing, headache or visual disturbances  Hives  Persistent dizziness or light-headedness  Extreme fatigue  Any other questions or concerns you may have after discharge  In an emergency, call 911 or go to an Emergency Department at a nearby hospital    [place]: [phone]   VISIT OUR WEBSITE FOR ADDITIONAL INFORMATION: [email or web address]

## 2019-11-08 LAB — GLUCOSE, CAPILLARY
Glucose-Capillary: 127 mg/dL — ABNORMAL HIGH (ref 70–99)
Glucose-Capillary: 156 mg/dL — ABNORMAL HIGH (ref 70–99)
Glucose-Capillary: 113 mg/dL — ABNORMAL HIGH (ref 70–99)
Glucose-Capillary: 130 mg/dL — ABNORMAL HIGH (ref 70–99)
Glucose-Capillary: 116 mg/dL — ABNORMAL HIGH (ref 70–99)

## 2019-11-08 LAB — CBC
HCT: 35.4 % — ABNORMAL LOW (ref 36.0–46.0)
Hemoglobin: 11.7 g/dL — ABNORMAL LOW (ref 12.0–15.0)
MCH: 32.1 pg (ref 26.0–34.0)
MCHC: 33.1 g/dL (ref 30.0–36.0)
MCV: 97 fL (ref 80.0–100.0)
Platelets: 172 10*3/uL (ref 150–400)
RBC: 3.65 MIL/uL — ABNORMAL LOW (ref 3.87–5.11)
RDW: 14.1 % (ref 11.5–15.5)
WBC: 7.6 10*3/uL (ref 4.0–10.5)
nRBC: 0 % (ref 0.0–0.2)

## 2019-11-08 LAB — BASIC METABOLIC PANEL
Anion gap: 10 (ref 5–15)
BUN: 6 mg/dL — ABNORMAL LOW (ref 8–23)
CO2: 30 mmol/L (ref 22–32)
Calcium: 7.9 mg/dL — ABNORMAL LOW (ref 8.9–10.3)
Chloride: 94 mmol/L — ABNORMAL LOW (ref 98–111)
Creatinine, Ser: 0.65 mg/dL (ref 0.44–1.00)
GFR calc Af Amer: >60 mL/min (ref >60)
GFR calc non Af Amer: >60 mL/min (ref >60)
Glucose, Bld: 126 mg/dL — ABNORMAL HIGH (ref 70–99)
Potassium: 3.2 mmol/L — ABNORMAL LOW (ref 3.5–5.1)
Sodium: 134 mmol/L — ABNORMAL LOW (ref 135–145)

## 2019-11-08 LAB — SARS CORONAVIRUS 2 (TAT 6-24 HRS): SARS Coronavirus 2: NEGATIVE

## 2019-11-08 MED ORDER — POTASSIUM CHLORIDE CRYS ER 20 MEQ PO TBCR
40.0000 meq | EXTENDED_RELEASE_TABLET | Freq: Two times a day (BID) | ORAL | Status: AC
  Administered 2019-11-08 – 2019-11-09 (×2): 40 meq via ORAL
  Filled 2019-11-08 (×2): qty 2

## 2019-11-08 NOTE — Progress Notes (Addendum)
Covid swab collected, on the way to lab.

## 2019-11-08 NOTE — Progress Notes (Signed)
PROGRESS NOTE    [name]  [id number] DOB: 17-Apr-1941 DOA: 11/04/2019 PCP: Josetta Huddle, MD    Brief Narrative:  [phone] with hx ETOH abuse, arthirits, CVA, HTN, Afib presented with fall, resulting in R hip fracture.  Orthopedics was consulted, underwent hemiarthroplasty of the right hip on 3/23  Assessment & Plan:   Principal Problem:   Closed displaced fracture of right femoral neck ([place]) Active Problems:   Vitamin D insufficiency   Osteoporosis with fracture   Syncope   UTI (urinary tract infection)   Hypoxia   Hypokalemia   Principal Problem: Acute right hip fracture (HCC) -Status post hemiarthroplasty right hip on 3/23, postop day #2 -Weightbearing as tolerated per orthopedics, started PT, recommended SNF -Continue pain control, DVT prophylaxis per orthopedics -COVID-19 was negative on 3/23. Have repeated COVID test for SNF placement, remains pending -SNF requesting CIWA of 0 for 24hrs, however patient is not actively withdrawing from alcohol  Active Problems:   Syncope -Likely vasovagal: 3/24 showed EF of 55 to 60%, normal left ventricular function, no aortic stenosis -CT head unremarkable, EEG normal -D-dimer 1.27, obtain venous Dopplers of the lower extremity, negative -Chest CTA neg for PE, however pos for B consolidations and pleural effusions. See below -Stable  Elevated d-dimer - venous dopplers neg for DVT, CTA neg for PE but shows b/l consolidations, pleural effusions - Stable  PNA -See above, B consolidations noted on CT chest -Initially continued on rocephin with amoxicillin already given for below UTI -Have narrowed abx to augmentin -Remains afebrile, tolerating abx  Vitamin D deficiency -Vitamin D 24.86, continue daily replacement   Enterococcus UTI (urinary tract infection) -Urine culture showed Enterococcus -Was on ampicillin per sensitivities -currently afebrile, now on augmentin  Protein calorie malnutrition Estimated  body mass index is 17.49 kg/m as calculated from the following:   Height as of this encounter: 5\' 10"  (1.778 m).   Weight as of this encounter: 55.3 kg.  Registered dietitian consult -cont to encourage PO intake as tolerated  History of alcohol use -I stable with no evidence of withdrawals while in hospital  DVT prophylaxis: Lovenox subq Code Status: Full Family Communication: Pt in room, family not at bedside Disposition Plan: from home, plan d/c SNF when COVID test returns neg  Consultants:   Orthopedic Surgery  Procedures:  UNIPOLAR HEMIARTHROPLASTY OF THERIGHTHIP   Antimicrobials: Anti-infectives (From admission, onward)   Start     Dose/Rate Route Frequency Ordered Stop   11/07/19 1700  amoxicillin-clavulanate (AUGMENTIN) 875-125 MG per tablet 1 tablet     1 tablet Oral 2 times daily with meals 11/07/19 1320 11/11/19 0759   11/06/19 1930  cefTRIAXone (ROCEPHIN) 2 g in sodium chloride 0.9 % 100 mL IVPB  Status:  Discontinued     2 g 200 mL/hr over 30 Minutes Intravenous Every 24 hours 11/06/19 1801 11/07/19 1320   11/05/19 1800  ampicillin (OMNIPEN) 1 g in sodium chloride 0.9 % 100 mL IVPB  Status:  Discontinued     1 g 300 mL/hr over 20 Minutes Intravenous Every 6 hours 11/05/19 1609 11/07/19 1320   11/04/19 2200  cefTRIAXone (ROCEPHIN) 1 g in sodium chloride 0.9 % 100 mL IVPB  Status:  Discontinued     1 g 200 mL/hr over 30 Minutes Intravenous Every 24 hours 11/04/19 0540 11/05/19 1609   11/04/19 1430  cefTRIAXone (ROCEPHIN) 1 g in sodium chloride 0.9 % 100 mL IVPB  Status:  Discontinued     1 g 200 mL/hr over  30 Minutes Intravenous  Once 11/04/19 1425 11/04/19 2003   11/04/19 1416  ceFAZolin (ANCEF) 2-4 GM/100ML-% IVPB    Note to Pharmacy: Grace Blight   : cabinet override      11/04/19 1416 11/05/19 0229   11/04/19 0300  cefTRIAXone (ROCEPHIN) 1 g in sodium chloride 0.9 % 100 mL IVPB     1 g 200 mL/hr over 30 Minutes Intravenous  Once 11/04/19 0256 11/04/19  0353      Subjective: Looking forward to starting rehab  Objective: Vitals:   11/08/19 0230 11/08/19 0753 11/08/19 1100 11/08/19 1238  BP: (!) 148/88 (!) 162/76 (!) 141/74 (!) 152/79  Pulse: 76 77 77 76  Resp: 17 18  18   Temp: 98.4 F (36.9 C) 98.7 F (37.1 C) 99.5 F (37.5 C) 98.9 F (37.2 C)  TempSrc: Oral Oral Oral Oral  SpO2: 97% 100% 97% 98%  Weight:   55.3 kg   Height:   5\' 10"  (1.778 m)     Intake/Output Summary (Last 24 hours) at 11/08/2019 1448 Last data filed at 11/08/2019 1100 Gross per 24 hour  Intake 480 ml  Output --  Net 480 ml   Filed Weights   11/04/19 0152 11/04/19 1213 11/08/19 1100  Weight: 55.3 kg 55.3 kg 55.3 kg    Examination: General exam: Conversant, in no acute distress Respiratory system: normal chest rise, clear, no audible wheezing Cardiovascular system: regular rhythm, s1-s2 Gastrointestinal system: Nondistended, nontender, pos BS Central nervous system: No seizures, no tremors Extremities: No cyanosis, no joint deformities Skin: No rashes, no pallor Psychiatry: Affect normal // no auditory hallucinations   Data Reviewed: I have personally reviewed following labs and imaging studies  CBC: Recent Labs  Lab 11/04/19 0330 11/04/19 0330 11/04/19 1634 11/05/19 0547 11/06/19 0621 11/07/19 0123 11/08/19 0421  WBC 11.6*  --   --  9.1 10.6* 9.7 7.6  NEUTROABS 10.1*  --   --   --   --   --   --   HGB 14.4   < > 15.0 14.0 13.0 12.6 11.7*  HCT 45.4   < > 44.0 43.7 40.1 38.9 35.4*  MCV 99.8  --   --  98.9 99.0 98.0 97.0  PLT 162  --   --  147* 149* 152 172   < > = values in this interval not displayed.   Basic Metabolic Panel: Recent Labs  Lab 11/04/19 0330 11/04/19 0330 11/04/19 1352 11/04/19 1634 11/05/19 0547 11/07/19 0123 11/08/19 0421  NA 136   < > 132* 134* 138 137 134*  K 2.9*   < > 4.1 3.5 4.0 3.5 3.2*  CL 99   < > 103 99 107 99 94*  CO2 22  --  11*  --  22 28 30   GLUCOSE 150*   < > 67* 187* 190* 136* 126*  BUN 8    < > 5* 7* 6* 9 6*  CREATININE 0.85   < > <0.30* 0.70 0.91 0.76 0.65  CALCIUM 7.9*  --  6.5*  --  8.1* 7.8* 7.9*  MG 1.8  --   --   --   --   --   --   PHOS 3.3  --   --   --   --   --   --    < > = values in this interval not displayed.   GFR: Estimated Creatinine Clearance: 49.8 mL/min (by C-G formula based on SCr of 0.65 mg/dL). Liver Function Tests:  Recent Labs  Lab 11/04/19 0330  AST 22  ALT 15  ALKPHOS 71  BILITOT 0.8  PROT 5.9*  ALBUMIN 2.8*   No results for input(s): LIPASE, AMYLASE in the last 168 hours. No results for input(s): AMMONIA in the last 168 hours. Coagulation Profile: Recent Labs  Lab 11/04/19 0330  INR 1.1   Cardiac Enzymes: No results for input(s): CKTOTAL, CKMB, CKMBINDEX, TROPONINI in the last 168 hours. BNP (last 3 results) No results for input(s): PROBNP in the last 8760 hours. HbA1C: No results for input(s): HGBA1C in the last 72 hours. CBG: Recent Labs  Lab 11/07/19 2005 11/07/19 2339 11/08/19 0423 11/08/19 0834 11/08/19 1134  GLUCAP 139* 122* 127* 156* 113*   Lipid Profile: No results for input(s): CHOL, HDL, LDLCALC, TRIG, CHOLHDL, LDLDIRECT in the last 72 hours. Thyroid Function Tests: No results for input(s): TSH, T4TOTAL, FREET4, T3FREE, THYROIDAB in the last 72 hours. Anemia Panel: No results for input(s): VITAMINB12, FOLATE, FERRITIN, TIBC, IRON, RETICCTPCT in the last 72 hours. Sepsis Labs: No results for input(s): PROCALCITON, LATICACIDVEN in the last 168 hours.  Recent Results (from the past 240 hour(s))  Urine culture     Status: Abnormal   Collection Time: 11/04/19  2:56 AM   Specimen: Urine, Catheterized  Result Value Ref Range Status   Specimen Description   Final    URINE, CATHETERIZED Performed at [facility] [address] [place], [place] [address]    Special Requests   Final    Normal Performed at [facility], [place] [address] [place], [place] [address]    Culture  >=100,000 COLONIES/mL ENTEROCOCCUS FAECALIS (A)  Final   Report Status 11/05/2019 FINAL  Final   Organism ID, Bacteria ENTEROCOCCUS FAECALIS (A)  Final      Susceptibility   Enterococcus faecalis - MIC*    AMPICILLIN <=2 SENSITIVE Sensitive     NITROFURANTOIN <=16 SENSITIVE Sensitive     VANCOMYCIN 1 SENSITIVE Sensitive     * >=100,000 COLONIES/mL ENTEROCOCCUS FAECALIS  Respiratory Panel by RT PCR (Flu A&B, Covid) - Nasopharyngeal Swab     Status: None   Collection Time: 11/04/19  4:44 AM   Specimen: Nasopharyngeal Swab  Result Value Ref Range Status   SARS Coronavirus 2 by RT PCR NEGATIVE NEGATIVE Final    Comment: (NOTE) SARS-CoV-2 target nucleic acids are NOT DETECTED. The SARS-CoV-2 RNA is generally detectable in upper respiratoy specimens during the acute phase of infection. The lowest concentration of SARS-CoV-2 viral copies this assay can detect is 131 copies/mL. A negative result does not preclude SARS-Cov-2 infection and should not be used as the sole basis for treatment or other patient management decisions. A negative result may occur with  improper specimen collection/handling, submission of specimen other than nasopharyngeal swab, presence of viral mutation(s) within the areas targeted by this assay, and inadequate number of viral copies (<131 copies/mL). A negative result must be combined with clinical observations, patient history, and epidemiological information. The expected result is Negative. Fact Sheet for Patients:  PinkCheek.be Fact Sheet for Healthcare Providers:  GravelBags.it This test is not yet ap proved or cleared by the [place] FDA and  has been authorized for detection and/or diagnosis of SARS-CoV-2 by FDA under an Emergency Use Authorization (EUA). This EUA will remain  in effect (meaning this test can be used) for the duration of the COVID-19 declaration under Section 564(b)(1) of the  Act, 21 U.S.C. section 360bbb-3(b)(1), unless the authorization is terminated or  revoked sooner.    Influenza A by PCR NEGATIVE NEGATIVE Final   Influenza B by PCR NEGATIVE NEGATIVE Final    Comment: (NOTE) The Xpert Xpress SARS-CoV-2/FLU/RSV assay is intended as an aid in  the diagnosis of influenza from Nasopharyngeal swab specimens and  should not be used as a sole basis for treatment. Nasal washings and  aspirates are unacceptable for Xpert Xpress SARS-CoV-2/FLU/RSV  testing. Fact Sheet for Patients: PinkCheek.be Fact Sheet for Healthcare Providers: GravelBags.it This test is not yet approved or cleared by the [place] FDA and  has been authorized for detection and/or diagnosis of SARS-CoV-2 by  FDA under an Emergency Use Authorization (EUA). This EUA will remain  in effect (meaning this test can be used) for the duration of the  Covid-19 declaration under Section 564(b)(1) of the Act, 21  U.S.C. section 360bbb-3(b)(1), unless the authorization is  terminated or revoked. Performed at [facility], [place] [address] [place], [place] [address]   Surgical pcr screen     Status: None   Collection Time: 11/04/19  8:46 AM   Specimen: Nasal Mucosa; Nasal Swab  Result Value Ref Range Status   MRSA, PCR NEGATIVE NEGATIVE Final   Staphylococcus aureus NEGATIVE NEGATIVE Final    Comment: (NOTE) The Xpert SA Assay (FDA approved for NASAL specimens in patients 44 years of age and older), is one component of a comprehensive surveillance program. It is not intended to diagnose infection nor to guide or monitor treatment. Performed at [facility], [place] [address] [place], [place] [address]      Radiology Studies: CT ANGIO CHEST PE W OR WO CONTRAST  Result Date: 11/06/2019 CLINICAL DATA:  [place]. Syncopal episode. Elevated D-dimer. Shortness of breath. EXAM: CT ANGIOGRAPHY CHEST WITH CONTRAST  TECHNIQUE: Multidetector CT imaging of the chest was performed using the standard protocol during bolus administration of intravenous contrast. Multiplanar CT image reconstructions and MIPs were obtained to evaluate the vascular anatomy. CONTRAST:  135mL OMNIPAQUE IOHEXOL 350 MG/ML SOLN COMPARISON:  07/18/2018 FINDINGS: Cardiovascular: The heart is upper limits of normal in size and stable. There is mild tortuosity, ectasia and calcification of the thoracic aorta but no focal aneurysm or dissection. The branch vessels are patent. Three-vessel coronary artery calcifications are stable. The pulmonary arterial tree is well opacified. No filling defects to suggest pulmonary embolism. Mediastinum/Nodes: Scattered mediastinal and hilar lymph nodes are stable. No mass or overt adenopathy the. The esophagus is grossly normal. Lungs/Pleura: Small bilateral pleural effusions are noted along with dense bilateral lower lobe airspace consolidation, likely pneumonia. No pulmonary edema. No worrisome pulmonary lesions. Upper Abdomen: No significant upper abdominal findings. Stable hepatic cysts. Musculoskeletal: Bilateral mastectomy defects. No chest wall mass, supraclavicular or axillary adenopathy. The bony thorax is intact. No worrisome bone lesions. Review of the MIP images confirms the above findings. IMPRESSION: 1. No CT findings for pulmonary embolism. 2. Stable atherosclerotic calcifications involving the thoracic aorta but no focal aneurysm or dissection. 3. Stable three-vessel coronary artery calcifications. 4. Small bilateral pleural effusions and dense bilateral lower lobe airspace consolidation, likely pneumonia. Aortic Atherosclerosis (ICD10-I70.0). Aortic Atherosclerosis (ICD10-I70.0). Electronically Signed   By: Marijo Sanes M.D.   On: 11/06/2019 17:40    Scheduled Meds: . amoxicillin-clavulanate  1 tablet Oral BID WC  . vitamin C  500 mg Oral Daily  . Chlorhexidine Gluconate Cloth  6 each Topical Daily  .  cholecalciferol  2,000 Units Oral BID  . docusate sodium  100 mg Oral  BID  . enoxaparin (LOVENOX) injection  40 mg Subcutaneous Q24H  . folic acid  1 mg Oral Daily  . multivitamin with minerals  1 tablet Oral Daily  . pantoprazole  40 mg Oral Daily  . polyethylene glycol  17 g Oral Daily  . sodium chloride flush  10-40 mL Intracatheter Q12H  . thiamine  100 mg Oral Daily  . traMADol  50 mg Oral Q12H   Continuous Infusions:   LOS: 4 days   Marylu Lund, MD [facility] Pager On Amion  If 7PM-7AM, please contact night-coverage 11/08/2019, 2:48 PM

## 2019-11-08 NOTE — TOC Progression Note (Signed)
Transition of Care [facility]) - Progression Note    Patient Details  Name: [name] MRN: [id number] Date of Birth: [birth year]  Transition of Care [facility]) CM/SW [place], [place] Phone Number: [phone] 11/08/2019, 12:01 PM  Clinical Narrative:     CSW spoke with [name] from facility and inquired about patient's CIWA score. CSW reached out to RN and was notified that CIWA must be 0 for 24 hours before facility can accept.  CSW updated facility and MD as well RN.  TOC team will continue to follow for discharge planning needs. Expected Discharge Plan: Skilled Nursing Facility Barriers to Discharge: No Barriers Identified  Expected Discharge Plan and Services Expected Discharge Plan: [place]                                               Social Determinants of Health (SDOH) Interventions    Readmission Risk Interventions No flowsheet data found.

## 2019-11-08 NOTE — Plan of Care (Signed)
  Problem: Safety: Goal: Ability to remain free from injury will improve Outcome: Progressing   

## 2019-11-09 DIAGNOSIS — W19XXXA Unspecified fall, initial encounter: Secondary | ICD-10-CM

## 2019-11-09 DIAGNOSIS — Y92009 Unspecified place in unspecified non-institutional (private) residence as the place of occurrence of the external cause: Secondary | ICD-10-CM

## 2019-11-09 LAB — CBC
HCT: 36.2 % (ref 36.0–46.0)
Hemoglobin: 11.9 g/dL — ABNORMAL LOW (ref 12.0–15.0)
MCH: 31.7 pg (ref 26.0–34.0)
MCHC: 32.9 g/dL (ref 30.0–36.0)
MCV: 96.5 fL (ref 80.0–100.0)
Platelets: 225 10*3/uL (ref 150–400)
RBC: 3.75 MIL/uL — ABNORMAL LOW (ref 3.87–5.11)
RDW: 13.9 % (ref 11.5–15.5)
WBC: 6.5 10*3/uL (ref 4.0–10.5)
nRBC: 0 % (ref 0.0–0.2)

## 2019-11-09 LAB — BASIC METABOLIC PANEL
Anion gap: 9 (ref 5–15)
BUN: 5 mg/dL — ABNORMAL LOW (ref 8–23)
CO2: 31 mmol/L (ref 22–32)
Calcium: 8.3 mg/dL — ABNORMAL LOW (ref 8.9–10.3)
Chloride: 96 mmol/L — ABNORMAL LOW (ref 98–111)
Creatinine, Ser: 0.77 mg/dL (ref 0.44–1.00)
GFR calc Af Amer: >60 mL/min (ref >60)
GFR calc non Af Amer: >60 mL/min (ref >60)
Glucose, Bld: 114 mg/dL — ABNORMAL HIGH (ref 70–99)
Potassium: 3.8 mmol/L (ref 3.5–5.1)
Sodium: 136 mmol/L (ref 135–145)

## 2019-11-09 LAB — GLUCOSE, CAPILLARY
Glucose-Capillary: 108 mg/dL — ABNORMAL HIGH (ref 70–99)
Glucose-Capillary: 129 mg/dL — ABNORMAL HIGH (ref 70–99)
Glucose-Capillary: 146 mg/dL — ABNORMAL HIGH (ref 70–99)
Glucose-Capillary: 150 mg/dL — ABNORMAL HIGH (ref 70–99)

## 2019-11-09 MED ORDER — IPRATROPIUM-ALBUTEROL 0.5-2.5 (3) MG/3ML IN SOLN: 3.0000 mL | RESPIRATORY_TRACT | 0 refills | Status: AC | PRN

## 2019-11-09 MED ORDER — DOCUSATE SODIUM 100 MG PO CAPS: 100.0000 mg | ORAL_CAPSULE | Freq: Two times a day (BID) | ORAL | 0 refills | Status: AC

## 2019-11-09 MED ORDER — AMOXICILLIN-POT CLAVULANATE 875-125 MG PO TABS: 1.0000 | ORAL_TABLET | Freq: Two times a day (BID) | ORAL | 0 refills | Status: AC

## 2019-11-09 NOTE — Discharge Summary (Signed)
Physician Discharge Summary  Kathaleya Warrick [id number] DOB: 26-Jul-1941 DOA: 11/04/2019  PCP: Josetta Huddle, MD  Admit date: 11/04/2019 Discharge date: 11/09/2019  Admitted From: Home Disposition:  SNF  Recommendations for Outpatient Follow-up:  1. Follow up with PCP in 1-2 weeks 2. Follow up with Orthopedic Surgery as scheduled 3. Cont on 2LNC and wean O2 as tolerated 4. Complete 3 more days of augmentin   COVID test neg (3/27)  Discharge Condition:Stable CODE STATUS:Full Diet recommendation: Heart healthy   Brief/Interim Summary: 79yo with hx ETOH abuse, arthirits, CVA, HTN, Afib presented with fall, resulting in R hip fracture.Orthopedics was consulted, underwent hemiarthroplasty of the right hip on 3/23  Discharge Diagnoses:  Principal Problem:   Closed displaced fracture of right femoral neck ([place]) Active Problems:   Vitamin D insufficiency   Osteoporosis with fracture   Syncope   UTI (urinary tract infection)   Hypoxia   Hypokalemia  Principal Problem: Acuteright hip fracture ([place]) -Status post hemiarthroplasty right hip on 3/23 -Weightbearing as tolerated per orthopedics, started PT, recommended SNF -Continue pain control, DVT prophylaxis per orthopedics -COVID-19 was negative on 3/23 and again on 3/27 -SNF requesting CIWA of 0 for 24hrs, however patient is not actively withdrawing from alcohol. See below. Pt reports having likely benign tremor present over the past year at baseline, PCP already aware.  Active Problems: Syncope -Likely vasovagal: 3/24 showed EF of 55 to 60%, normal left ventricular function, no aortic stenosis -CT head unremarkable, EEG normal -D-dimer 1.27, obtain venous Dopplers of the lower extremity,negative -Chest CTA neg for PE, however pos for B consolidations and pleural effusions. See below -Stable  Elevated d-dimer - venous dopplers neg for DVT, CTA neg for PE but shows b/l consolidations, pleural effusions -  Stable  PNA -See above, B consolidations noted on CT chest -Initially continued on rocephin with amoxicillin already given for below UTI -Have narrowed abx to augmentin -Remains afebrile, tolerating abx. Plan to complete 3 more days of augmentin on d/c  Vitamin D deficiency -Vitamin D 24.86   EnterococcusUTI (urinary tract infection) -Urine culture showed Enterococcus -Was on ampicillin per sensitivities -currently afebrile, now on augmentin per above  Protein calorie malnutrition Estimated body mass index is 17.49 kg/m as calculated from the following: Height as of this encounter: 5\' 10"  (1.778 m). Weight as of this encounter: 55.3 kg. Registered dietitian consult -cont to encourage PO intake as tolerated  History of alcohol use -Pt has remained stable with no evidence of withdrawals while in hospital  Tremor -see above -Pt reports baseline tremor over the past year, of which her PCP is already aware  Discharge Instructions   Allergies as of 11/09/2019      Reactions   Vicodin [hydrocodone-acetaminophen]    UNSPECIFIED REACTION    Ambien [zolpidem Tartrate] Other (See Comments)   Causes nightmares      Medication List    STOP taking these medications   amLODipine 5 MG tablet Commonly known as: NORVASC   oxyCODONE-acetaminophen 5-325 MG tablet Commonly known as: PERCOCET/ROXICET     TAKE these medications   acetaminophen 325 MG tablet Commonly known as: TYLENOL Take 325 mg by mouth every 6 (six) hours as needed for moderate pain.   amoxicillin-clavulanate 875-125 MG tablet Commonly known as: AUGMENTIN Take 1 tablet by mouth 2 (two) times daily with a meal for 3 days.   ascorbic acid 500 MG tablet Commonly known as: VITAMIN C Take 1 tablet (500 mg total) by mouth daily.   docusate  sodium 100 MG capsule Commonly known as: COLACE Take 1 capsule (100 mg total) by mouth 2 (two) times daily.   enoxaparin 40 MG/0.4ML injection Commonly known  as: LOVENOX Inject 0.4 mLs (40 mg total) into the skin daily for 21 days.   ipratropium-albuterol 0.5-2.5 (3) MG/3ML Soln Commonly known as: DUONEB Take 3 mLs by nebulization every 4 (four) hours as needed.   metoprolol succinate 25 MG 24 hr tablet Commonly known as: TOPROL-XL Take 25 mg by mouth daily.   multivitamin with minerals Tabs tablet Take 1 tablet by mouth daily.   nitroGLYCERIN 0.4 MG SL tablet Commonly known as: NITROSTAT Place 1 tablet under the tongue every 5 (five) minutes as needed for chest pain.   omeprazole 20 MG capsule Commonly known as: PRILOSEC Take 1 capsule by mouth 2 (two) times daily as needed (heart burn).   oxyCODONE 5 MG immediate release tablet Commonly known as: Oxy IR/ROXICODONE Take 1 tablet (5 mg total) by mouth every 6 (six) hours as needed for moderate pain or severe pain.   traMADol 50 MG tablet Commonly known as: ULTRAM Take 1 tablet (50 mg total) by mouth every 8 (eight) hours as needed (mild or moderate pain).   Ventolin HFA 108 (90 Base) MCG/ACT inhaler Generic drug: albuterol Inhale 1-2 puffs into the lungs every 6 (six) hours as needed for wheezing or shortness of breath.   VITAMIN 0000000 IJ Inject 1 application as directed every 30 (thirty) days.   vitamin B-12 1000 MCG tablet Commonly known as: CYANOCOBALAMIN Take 1,000 mcg by mouth daily.   Vitamin D3 25 MCG tablet Commonly known as: Vitamin D Take 2 tablets (2,000 Units total) by mouth 2 (two) times daily. What changed:   medication strength  how much to take  when to take this      Follow-up Information    Altamese Schoeneck, MD. Schedule an appointment as soon as possible for a visit in 2 week(s).   Specialty: Orthopedic Surgery Contact information: [place] [address] [phone]        Josetta Huddle, MD. Schedule an appointment as soon as possible for a visit in 2 week(s).   Specialty: Internal Medicine Contact information: [address] [place] [address] [phone]          Allergies  Allergen Reactions  . Vicodin [Hydrocodone-Acetaminophen]     UNSPECIFIED REACTION   . Ambien [Zolpidem Tartrate] Other (See Comments)    Causes nightmares    Consultations:  Orthopedic Surgery  Procedures/Studies: DG Ribs Unilateral W/Chest Right  Result Date: 11/04/2019 CLINICAL DATA:  Fall, right chest pain EXAM: RIGHT RIBS AND CHEST - 3+ VIEW COMPARISON:  07/18/2018 CT FINDINGS: No visible acute bony abnormality. No rib fracture. Heart is normal size. Bibasilar atelectasis. No effusions. IMPRESSION: No visible rib fracture. Bibasilar atelectasis. Electronically Signed   By: Rolm Baptise M.D.   On: 11/04/2019 02:52   CT HEAD WO CONTRAST  Result Date: 11/04/2019 CLINICAL DATA:  Penetrating head trauma.  Fall from bed. EXAM: CT HEAD WITHOUT CONTRAST TECHNIQUE: Contiguous axial images were obtained from the base of the skull through the vertex without intravenous contrast. COMPARISON:  04/10/2017 FINDINGS: Brain: No evidence of acute infarction, hemorrhage, hydrocephalus, extra-axial collection or mass lesion/mass effect. Remote small vessel infarct at the left corona radiata. Vascular: No hyperdense vessel or unexpected calcification. Skull: Normal. Negative for fracture or focal lesion. Sinuses/Orbits: No acute finding. IMPRESSION: No evidence of intracranial injury. Electronically Signed  By: Monte Fantasia M.D.   On: 11/04/2019 07:55   CT ANGIO CHEST PE W OR WO CONTRAST  Result Date: 11/06/2019 CLINICAL DATA:  [place]. Syncopal episode. Elevated D-dimer. Shortness of breath. EXAM: CT ANGIOGRAPHY CHEST WITH CONTRAST TECHNIQUE: Multidetector CT imaging of the chest was performed using the standard protocol during bolus administration of intravenous contrast. Multiplanar CT image reconstructions and MIPs were obtained to evaluate the vascular anatomy. CONTRAST:  163mL OMNIPAQUE IOHEXOL 350 MG/ML SOLN  COMPARISON:  07/18/2018 FINDINGS: Cardiovascular: The heart is upper limits of normal in size and stable. There is mild tortuosity, ectasia and calcification of the thoracic aorta but no focal aneurysm or dissection. The branch vessels are patent. Three-vessel coronary artery calcifications are stable. The pulmonary arterial tree is well opacified. No filling defects to suggest pulmonary embolism. Mediastinum/Nodes: Scattered mediastinal and hilar lymph nodes are stable. No mass or overt adenopathy the. The esophagus is grossly normal. Lungs/Pleura: Small bilateral pleural effusions are noted along with dense bilateral lower lobe airspace consolidation, likely pneumonia. No pulmonary edema. No worrisome pulmonary lesions. Upper Abdomen: No significant upper abdominal findings. Stable hepatic cysts. Musculoskeletal: Bilateral mastectomy defects. No chest wall mass, supraclavicular or axillary adenopathy. The bony thorax is intact. No worrisome bone lesions. Review of the MIP images confirms the above findings. IMPRESSION: 1. No CT findings for pulmonary embolism. 2. Stable atherosclerotic calcifications involving the thoracic aorta but no focal aneurysm or dissection. 3. Stable three-vessel coronary artery calcifications. 4. Small bilateral pleural effusions and dense bilateral lower lobe airspace consolidation, likely pneumonia. Aortic Atherosclerosis (ICD10-I70.0). Aortic Atherosclerosis (ICD10-I70.0). Electronically Signed   By: Marijo Sanes M.D.   On: 11/06/2019 17:40   EEG adult  Result Date: 11/06/2019 Lora Havens, MD     11/06/2019  9:39 AM Patient Name: Ragnhild Forness MRN: [id number] Epilepsy Attending: Lora Havens Referring Physician/Provider: Ainsley Spinner, PA Date: 11/05/2019 Duration: 23.22 minutes Patient history: 79 year old female presented after syncope.  EEG to evaluate for seizures. Level of alertness: Awake AEDs during EEG study: Lorazepam Technical aspects: This EEG study was done with  scalp electrodes positioned according to the 10-20 International system of electrode placement. Electrical activity was acquired at a sampling rate of 500Hz  and reviewed with a high frequency filter of 70Hz  and a low frequency filter of 1Hz . EEG data were recorded continuously and digitally stored. Description: The posterior dominant rhythm consists of 8 Hz activity of moderate voltage (25-35 uV) seen predominantly in posterior head regions, symmetric and reactive to eye opening and eye closing. Hyperventilation and photic stimulation were not performed. IMPRESSION: This study is within normal limits. No seizures or epileptiform discharges were seen throughout the recording. Lora Havens   ECHOCARDIOGRAM COMPLETE  Result Date: 11/05/2019    ECHOCARDIOGRAM REPORT   Patient Name:   NATURI MCCORKLE Raglin Date of Exam: 11/05/2019 Medical Rec #:  [id number]        Height:       70.0 in Accession #:    [id number]       Weight:       121.9 lb Date of Birth:  July 24, 1941       BSA:          1.691 m Patient Age:    69 years         BP:           130/77 mmHg Patient Gender: F                HR:  77 bpm. Exam Location:  Inpatient Procedure: 2D Echo, Cardiac Doppler and Color Doppler Indications:    R55 Syncope  History:        Patient has prior history of Echocardiogram examinations, most                 recent 10/29/2018. Stroke, Arrythmias:Atrial Fibrillation,                 Signs/Symptoms:Syncope and Chest Pain; Risk                 Factors:Dyslipidemia. Hypoxia. ETOH. History of breast cancer.  Sonographer:    Roseanna Rainbow RDCS Referring Phys: [place]  Sonographer Comments: Technically difficult study due to poor echo windows. Restricted mobility. IMPRESSIONS  1. Left ventricular ejection fraction, by estimation, is 55 to 60%. The left ventricle has normal function. The left ventricle has no regional wall motion abnormalities. Left ventricular diastolic parameters were normal.  2. Right ventricular systolic  function is normal. The right ventricular size is normal. There is moderately elevated pulmonary artery systolic pressure.  3. The mitral valve is normal in structure. Trivial mitral valve regurgitation. No evidence of mitral stenosis.  4. Tricuspid valve regurgitation is moderate.  5. The aortic valve is tricuspid. Aortic valve regurgitation is not visualized. Mild to moderate aortic valve sclerosis/calcification is present, without any evidence of aortic stenosis.  6. March 2020 aortic root measured 3.9 cm.  7. The inferior vena cava is normal in size with greater than 50% respiratory variability, suggesting right atrial pressure of 3 mmHg. FINDINGS  Left Ventricle: Left ventricular ejection fraction, by estimation, is 55 to 60%. The left ventricle has normal function. The left ventricle has no regional wall motion abnormalities. The left ventricular internal cavity size was normal in size. There is  no left ventricular hypertrophy. Left ventricular diastolic parameters were normal. Right Ventricle: The right ventricular size is normal. No increase in right ventricular wall thickness. Right ventricular systolic function is normal. There is moderately elevated pulmonary artery systolic pressure. The tricuspid regurgitant velocity is 3.08 m/s, and with an assumed right atrial pressure of 15 mmHg, the estimated right ventricular systolic pressure is 99991111 mmHg. Left Atrium: Left atrial size was normal in size. Right Atrium: Right atrial size was normal in size. Pericardium: There is no evidence of pericardial effusion. Mitral Valve: The mitral valve is normal in structure. There is mild thickening of the mitral valve leaflet(s). There is mild calcification of the mitral valve leaflet(s). Normal mobility of the mitral valve leaflets. Trivial mitral valve regurgitation. No evidence of mitral valve stenosis. Tricuspid Valve: The tricuspid valve is normal in structure. Tricuspid valve regurgitation is moderate . No  evidence of tricuspid stenosis. Aortic Valve: The aortic valve is tricuspid. . There is moderate thickening and moderate calcification of the aortic valve. Aortic valve regurgitation is not visualized. Mild to moderate aortic valve sclerosis/calcification is present, without any evidence of aortic stenosis. There is moderate thickening of the aortic valve. There is moderate calcification of the aortic valve. Pulmonic Valve: The pulmonic valve was normal in structure. Pulmonic valve regurgitation is trivial. No evidence of pulmonic stenosis. Aorta: March 2020 aortic root measured 3.9 cm. The aortic root is normal in size and structure. Venous: The inferior vena cava is normal in size with greater than 50% respiratory variability, suggesting right atrial pressure of 3 mmHg. IAS/Shunts: No atrial level shunt detected by color flow Doppler.  LEFT VENTRICLE PLAX 2D LVOT diam:  2.20 cm     Diastology LV SV:         71          LV e' lateral:   8.59 cm/s LV SV Index:   42          LV E/e' lateral: 9.8 LVOT Area:     3.80 cm    LV e' medial:    4.24 cm/s                            LV E/e' medial:  19.8  LV Volumes (MOD) LV vol d, MOD A2C: 44.5 ml LV vol d, MOD A4C: 48.8 ml LV vol s, MOD A2C: 11.3 ml LV vol s, MOD A4C: 11.6 ml LV SV MOD A2C:     33.2 ml LV SV MOD A4C:     48.8 ml LV SV MOD BP:      34.5 ml RIGHT VENTRICLE            IVC RV S prime:     6.85 cm/s  IVC diam: 1.54 cm TAPSE (M-mode): 1.6 cm LEFT ATRIUM             Index       RIGHT ATRIUM           Index LA Vol (A2C):   19.9 ml 11.77 ml/m RA Area:     13.20 cm LA Vol (A4C):   18.5 ml 10.94 ml/m RA Volume:   31.80 ml  18.80 ml/m LA Biplane Vol: 20.6 ml 12.18 ml/m  AORTIC VALVE LVOT Vmax:   95.80 cm/s LVOT Vmean:  67.900 cm/s LVOT VTI:    0.188 m  AORTA Ao Asc diam: 3.70 cm MITRAL VALVE                TRICUSPID VALVE MV Area (PHT): 2.66 cm     TR Peak grad:   37.9 mmHg MV Decel Time: 285 msec     TR Vmax:        308.00 cm/s MV E velocity: 83.90 cm/s MV A  velocity: 113.00 cm/s  SHUNTS MV E/A ratio:  0.74         Systemic VTI:  0.19 m                             Systemic Diam: 2.20 cm Jenkins Rouge MD Electronically signed by Jenkins Rouge MD Signature Date/Time: 11/05/2019/11:48:29 AM    Final    DG Hip Port Unilat With Pelvis 1V Right  Result Date: 11/04/2019 CLINICAL DATA:  Status post right total hip replacement EXAM: DG HIP (WITH OR WITHOUT PELVIS) 2V PORT RIGHT COMPARISON:  Preoperative pelvis and right hip images November 04, 2019 FINDINGS: Frontal pelvis as well as lateral right hip images obtained. There is a new total hip replacement on the right with prosthetic components well-seated. Prior total hip replacement on the left with prosthetic components well-seated as well. No fracture or dislocation evident postoperative. Sacroiliac joints appear normal bilaterally. IMPRESSION: New total hip replacement right with prosthetic components well-seated. Total hip replacement on the left with prosthetic components well-seated. No acute fracture or dislocation evident. Electronically Signed   By: Lowella Grip III M.D.   On: 11/04/2019 19:31   DG Hip Unilat W or Wo Pelvis 2-3 Views Right  Result Date: 11/04/2019 CLINICAL DATA:  Fall out of bed.  Right hip pain  EXAM: DG HIP (WITH OR WITHOUT PELVIS) 2-3V RIGHT COMPARISON:  04/10/2017 FINDINGS: There is a right femoral neck fracture. Varus angulation. No subluxation or dislocation. Prior left hip replacement. IMPRESSION: Right femoral neck fracture with varus angulation. Electronically Signed   By: Rolm Baptise M.D.   On: 11/04/2019 02:51   VAS US CAROTID  Result Date: 11/04/2019 Carotid Arterial Duplex Study Indications:       Syncope. Limitations        Today's exam was limited due to the high bifurcation of the                    carotid, the patient's respiratory variation and patient                    somnolence, patient movement. Comparison Study:  No prior studies. Performing Technologist: Oliver Hum RVT  Examination Guidelines: A complete evaluation includes B-mode imaging, spectral Doppler, color Doppler, and power Doppler as needed of all accessible portions of each vessel. Bilateral testing is considered an integral part of a complete examination. Limited examinations for reoccurring indications may be performed as noted.  Right Carotid Findings: +----------+--------+--------+--------+-----------------------+--------------+           PSV cm/sEDV cm/sStenosisPlaque Description     Comments       +----------+--------+--------+--------+-----------------------+--------------+ CCA Prox  61      7               smooth and heterogenous               +----------+--------+--------+--------+-----------------------+--------------+ CCA Distal52      6               smooth and heterogenous               +----------+--------+--------+--------+-----------------------+--------------+ ICA Prox  27      6               smooth and heterogenous               +----------+--------+--------+--------+-----------------------+--------------+ ICA Distal                                               Not visualized +----------+--------+--------+--------+-----------------------+--------------+ ECA       158     11                                                    +----------+--------+--------+--------+-----------------------+--------------+ +----------+--------+-------+--------+-------------------+           PSV cm/sEDV cmsDescribeArm Pressure (mmHG) +----------+--------+-------+--------+-------------------+ [id number]                                         +----------+--------+-------+--------+-------------------+ +---------+--------+--+--------+-+---------+ VertebralPSV cm/s27EDV cm/s4Antegrade +---------+--------+--+--------+-+---------+  Left Carotid Findings: +----------+--------+--------+--------+-----------------------+--------+           PSV cm/sEDV  cm/sStenosisPlaque Description     Comments +----------+--------+--------+--------+-----------------------+--------+ CCA Prox  62      12              smooth and heterogenous         +----------+--------+--------+--------+-----------------------+--------+ CCA Distal58  11              smooth and heterogenous         +----------+--------+--------+--------+-----------------------+--------+ ICA Prox  54      17              smooth and heterogenous         +----------+--------+--------+--------+-----------------------+--------+ ICA Distal65      21                                     tortuous +----------+--------+--------+--------+-----------------------+--------+ ECA       240     22                                              +----------+--------+--------+--------+-----------------------+--------+ +----------+--------+--------+--------+-------------------+           PSV cm/sEDV cm/sDescribeArm Pressure (mmHG) +----------+--------+--------+--------+-------------------+ Subclavian132                                         +----------+--------+--------+--------+-------------------+ +---------+--------+--+--------+-+---------+ VertebralPSV cm/s44EDV cm/s9Antegrade +---------+--------+--+--------+-+---------+   Summary: Right Carotid: Velocities in the right ICA are consistent with a 1-39% stenosis. Left Carotid: Velocities in the left ICA are consistent with a 1-39% stenosis. Vertebrals: Bilateral vertebral arteries demonstrate antegrade flow. *See table(s) above for measurements and observations.  Electronically signed by Deitra Mayo MD on 11/04/2019 at 1:08:46 PM.    Final    VAS [place] LOWER EXTREMITY VENOUS (DVT)  Result Date: 11/06/2019  Lower Venous DVT Study Indications: Elevated Ddimer.  Risk Factors: None identified. Comparison Study: No prior studies. Performing Technologist: Oliver Hum RVT  Examination Guidelines: A complete evaluation  includes B-mode imaging, spectral Doppler, color Doppler, and power Doppler as needed of all accessible portions of each vessel. Bilateral testing is considered an integral part of a complete examination. Limited examinations for reoccurring indications may be performed as noted. The reflux portion of the exam is performed with the patient in reverse Trendelenburg.  +---------+---------------+---------+-----------+----------+--------------+ RIGHT    CompressibilityPhasicitySpontaneityPropertiesThrombus Aging +---------+---------------+---------+-----------+----------+--------------+ CFV      Full           Yes      Yes                                 +---------+---------------+---------+-----------+----------+--------------+ SFJ      Full                                                        +---------+---------------+---------+-----------+----------+--------------+ FV Prox  Full                                                        +---------+---------------+---------+-----------+----------+--------------+ FV Mid   Full                                                        +---------+---------------+---------+-----------+----------+--------------+  FV DistalFull                                                        +---------+---------------+---------+-----------+----------+--------------+ PFV      Full                                                        +---------+---------------+---------+-----------+----------+--------------+ POP      Full           Yes      Yes                                 +---------+---------------+---------+-----------+----------+--------------+ PTV      Full                                                        +---------+---------------+---------+-----------+----------+--------------+ PERO     Full                                                         +---------+---------------+---------+-----------+----------+--------------+   +---------+---------------+---------+-----------+----------+--------------+ LEFT     CompressibilityPhasicitySpontaneityPropertiesThrombus Aging +---------+---------------+---------+-----------+----------+--------------+ CFV      Full           Yes      Yes                                 +---------+---------------+---------+-----------+----------+--------------+ SFJ      Full                                                        +---------+---------------+---------+-----------+----------+--------------+ FV Prox  Full                                                        +---------+---------------+---------+-----------+----------+--------------+ FV Mid   Full                                                        +---------+---------------+---------+-----------+----------+--------------+ FV DistalFull                                                        +---------+---------------+---------+-----------+----------+--------------+  PFV      Full                                                        +---------+---------------+---------+-----------+----------+--------------+ POP      Full           Yes      Yes                                 +---------+---------------+---------+-----------+----------+--------------+ PTV      Full                                                        +---------+---------------+---------+-----------+----------+--------------+ PERO     Full                                                        +---------+---------------+---------+-----------+----------+--------------+     Summary: RIGHT: - There is no evidence of deep vein thrombosis in the lower extremity.  - No cystic structure found in the popliteal fossa.  LEFT: - There is no evidence of deep vein thrombosis in the lower extremity.  - No cystic structure found in the popliteal fossa.   *See table(s) above for measurements and observations. Electronically signed by Servando Snare MD on 11/06/2019 at 4:39:29 PM.    Final      Subjective: Eager to start rehab  Discharge Exam: Vitals:   11/09/19 0430 11/09/19 0809  BP: 140/70 (!) 143/67  Pulse: 88 78  Resp: 16 18  Temp: 99.1 F (37.3 C) 99.1 F (37.3 C)  SpO2: 93% 96%   Vitals:   11/08/19 1238 11/08/19 1925 11/09/19 0430 11/09/19 0809  BP: (!) 152/79 (!) 157/82 140/70 (!) 143/67  Pulse: 76 90 88 78  Resp: 18 16 16 18   Temp: 98.9 F (37.2 C) 99.6 F (37.6 C) 99.1 F (37.3 C) 99.1 F (37.3 C)  TempSrc: Oral Oral Oral Oral  SpO2: 98% 91% 93% 96%  Weight:      Height:        General: Pt is alert, awake, not in acute distress Cardiovascular: RRR, S1/S2 +, no rubs, no gallops Respiratory: CTA bilaterally, no wheezing, no rhonchi Abdominal: Soft, NT, ND, bowel sounds + Extremities: no edema, no cyanosis   The results of significant diagnostics from this hospitalization (including imaging, microbiology, ancillary and laboratory) are listed below for reference.     Microbiology: Recent Results (from the past 240 hour(s))  Urine culture     Status: Abnormal   Collection Time: 11/04/19  2:56 AM   Specimen: Urine, Catheterized  Result Value Ref Range Status   Specimen Description   Final    URINE, CATHETERIZED Performed at [facility] [address] [place], [place] [address]    Special Requests   Final    Normal Performed at [facility], [place] [address] [place], [place] [address]    Culture >=  100,000 COLONIES/mL ENTEROCOCCUS FAECALIS (A)  Final   Report Status 11/05/2019 FINAL  Final   Organism ID, Bacteria ENTEROCOCCUS FAECALIS (A)  Final      Susceptibility   Enterococcus faecalis - MIC*    AMPICILLIN <=2 SENSITIVE Sensitive     NITROFURANTOIN <=16 SENSITIVE Sensitive     VANCOMYCIN 1 SENSITIVE Sensitive     * >=100,000 COLONIES/mL ENTEROCOCCUS FAECALIS   Respiratory Panel by RT PCR (Flu A&B, Covid) - Nasopharyngeal Swab     Status: None   Collection Time: 11/04/19  4:44 AM   Specimen: Nasopharyngeal Swab  Result Value Ref Range Status   SARS Coronavirus 2 by RT PCR NEGATIVE NEGATIVE Final    Comment: (NOTE) SARS-CoV-2 target nucleic acids are NOT DETECTED. The SARS-CoV-2 RNA is generally detectable in upper respiratoy specimens during the acute phase of infection. The lowest concentration of SARS-CoV-2 viral copies this assay can detect is 131 copies/mL. A negative result does not preclude SARS-Cov-2 infection and should not be used as the sole basis for treatment or other patient management decisions. A negative result may occur with  improper specimen collection/handling, submission of specimen other than nasopharyngeal swab, presence of viral mutation(s) within the areas targeted by this assay, and inadequate number of viral copies (<131 copies/mL). A negative result must be combined with clinical observations, patient history, and epidemiological information. The expected result is Negative. Fact Sheet for Patients:  PinkCheek.be Fact Sheet for Healthcare Providers:  GravelBags.it This test is not yet ap proved or cleared by the [place] FDA and  has been authorized for detection and/or diagnosis of SARS-CoV-2 by FDA under an Emergency Use Authorization (EUA). This EUA will remain  in effect (meaning this test can be used) for the duration of the COVID-19 declaration under Section 564(b)(1) of the Act, 21 U.S.C. section 360bbb-3(b)(1), unless the authorization is terminated or revoked sooner.    Influenza A by PCR NEGATIVE NEGATIVE Final   Influenza B by PCR NEGATIVE NEGATIVE Final    Comment: (NOTE) The Xpert Xpress SARS-CoV-2/FLU/RSV assay is intended as an aid in  the diagnosis of influenza from Nasopharyngeal swab specimens and  should not be used as a sole  basis for treatment. Nasal washings and  aspirates are unacceptable for Xpert Xpress SARS-CoV-2/FLU/RSV  testing. Fact Sheet for Patients: PinkCheek.be Fact Sheet for Healthcare Providers: GravelBags.it This test is not yet approved or cleared by the [place] FDA and  has been authorized for detection and/or diagnosis of SARS-CoV-2 by  FDA under an Emergency Use Authorization (EUA). This EUA will remain  in effect (meaning this test can be used) for the duration of the  Covid-19 declaration under Section 564(b)(1) of the Act, 21  U.S.C. section 360bbb-3(b)(1), unless the authorization is  terminated or revoked. Performed at [facility], [place] [address] [place], [place] [address]   Surgical pcr screen     Status: None   Collection Time: 11/04/19  8:46 AM   Specimen: Nasal Mucosa; Nasal Swab  Result Value Ref Range Status   MRSA, PCR NEGATIVE NEGATIVE Final   Staphylococcus aureus NEGATIVE NEGATIVE Final    Comment: (NOTE) The Xpert SA Assay (FDA approved for NASAL specimens in patients 50 years of age and older), is one component of a comprehensive surveillance program. It is not intended to diagnose infection nor to guide or monitor treatment. Performed at [facility], [place] [address], [place], [place] [address]   SARS CORONAVIRUS 2 (TAT 6-24 HRS)  Nasopharyngeal Nasopharyngeal Swab     Status: None   Collection Time: 11/08/19  8:23 AM   Specimen: Nasopharyngeal Swab  Result Value Ref Range Status   SARS Coronavirus 2 NEGATIVE NEGATIVE Final    Comment: (NOTE) SARS-CoV-2 target nucleic acids are NOT DETECTED. The SARS-CoV-2 RNA is generally detectable in upper and lower respiratory specimens during the acute phase of infection. Negative results do not preclude SARS-CoV-2 infection, do not rule out co-infections with other pathogens, and should not be used as the sole basis for treatment or  other patient management decisions. Negative results must be combined with clinical observations, patient history, and epidemiological information. The expected result is Negative. Fact Sheet for Patients: SugarRoll.be Fact Sheet for Healthcare Providers: https://www.woods-mathews.com/ This test is not yet approved or cleared by the [place] FDA and  has been authorized for detection and/or diagnosis of SARS-CoV-2 by FDA under an Emergency Use Authorization (EUA). This EUA will remain  in effect (meaning this test can be used) for the duration of the COVID-19 declaration under Section 56 4(b)(1) of the Act, 21 U.S.C. section 360bbb-3(b)(1), unless the authorization is terminated or revoked sooner. Performed at [facility], [place] [address], [place], [place] [address]      Labs: BNP (last 3 results) Recent Labs    11/06/19 1830  BNP 123456*   Basic Metabolic Panel: Recent Labs  Lab 11/04/19 0330 11/04/19 0330 11/04/19 1352 11/04/19 1352 11/04/19 1634 11/05/19 0547 11/07/19 0123 11/08/19 0421 11/09/19 0423  NA 136   < > 132*   < > 134* 138 137 134* 136  K 2.9*   < > 4.1   < > 3.5 4.0 3.5 3.2* 3.8  CL 99   < > 103   < > 99 107 99 94* 96*  CO2 22   < > 11*  --   --  22 28 30 31   GLUCOSE 150*   < > 67*   < > 187* 190* 136* 126* 114*  BUN 8   < > 5*   < > 7* 6* 9 6* 5*  CREATININE 0.85   < > <0.30*   < > 0.70 0.91 0.76 0.65 0.77  CALCIUM 7.9*   < > 6.5*  --   --  8.1* 7.8* 7.9* 8.3*  MG 1.8  --   --   --   --   --   --   --   --   PHOS 3.3  --   --   --   --   --   --   --   --    < > = values in this interval not displayed.   Liver Function Tests: Recent Labs  Lab 11/04/19 0330  AST 22  ALT 15  ALKPHOS 71  BILITOT 0.8  PROT 5.9*  ALBUMIN 2.8*   No results for input(s): LIPASE, AMYLASE in the last 168 hours. No results for input(s): AMMONIA in the last 168 hours. CBC: Recent Labs  Lab 11/04/19 0330  11/04/19 1634 11/05/19 0547 11/06/19 0621 11/07/19 0123 11/08/19 0421 11/09/19 0423  WBC 11.6*  --  9.1 10.6* 9.7 7.6 6.5  NEUTROABS 10.1*  --   --   --   --   --   --   HGB 14.4   < > 14.0 13.0 12.6 11.7* 11.9*  HCT 45.4   < > 43.7 40.1 38.9 35.4* 36.2  MCV 99.8  --  98.9 99.0 98.0 97.0 96.5  PLT 162  --  147* 149* 152 172 225   < > = values in this interval not displayed.   Cardiac Enzymes: No results for input(s): CKTOTAL, CKMB, CKMBINDEX, TROPONINI in the last 168 hours. BNP: Invalid input(s): POCBNP CBG: Recent Labs  Lab 11/08/19 1545 11/08/19 2114 11/09/19 0011 11/09/19 0416 11/09/19 0807  GLUCAP 130* 116* 150* 108* 146*   D-Dimer No results for input(s): DDIMER in the last 72 hours. Hgb A1c No results for input(s): HGBA1C in the last 72 hours. Lipid Profile No results for input(s): CHOL, HDL, LDLCALC, TRIG, CHOLHDL, LDLDIRECT in the last 72 hours. Thyroid function studies No results for input(s): TSH, T4TOTAL, T3FREE, THYROIDAB in the last 72 hours.  Invalid input(s): FREET3 Anemia work up No results for input(s): VITAMINB12, FOLATE, FERRITIN, TIBC, IRON, RETICCTPCT in the last 72 hours. Urinalysis    Component Value Date/Time   COLORURINE YELLOW 11/04/2019 0152   APPEARANCEUR HAZY (A) 11/04/2019 0152   LABSPEC 1.008 11/04/2019 0152   PHURINE 5.0 11/04/2019 0152   GLUCOSEU NEGATIVE 11/04/2019 0152   HGBUR SMALL (A) 11/04/2019 0152   BILIRUBINUR NEGATIVE 11/04/2019 0152   KETONESUR 5 (A) 11/04/2019 0152   PROTEINUR NEGATIVE 11/04/2019 0152   UROBILINOGEN 0.2 09/11/2014 2309   NITRITE NEGATIVE 11/04/2019 0152   LEUKOCYTESUR LARGE (A) 11/04/2019 0152   Sepsis Labs Invalid input(s): PROCALCITONIN,  WBC,  LACTICIDVEN Microbiology Recent Results (from the past 240 hour(s))  Urine culture     Status: Abnormal   Collection Time: 11/04/19  2:56 AM   Specimen: Urine, Catheterized  Result Value Ref Range Status   Specimen Description   Final    URINE,  CATHETERIZED Performed at [facility], [place] [address], [place], [place] [address]    Special Requests   Final    Normal Performed at [facility], [place] [address], [place], [place] [address]    Culture >=100,000 COLONIES/mL ENTEROCOCCUS FAECALIS (A)  Final   Report Status 11/05/2019 FINAL  Final   Organism ID, Bacteria ENTEROCOCCUS FAECALIS (A)  Final      Susceptibility   Enterococcus faecalis - MIC*    AMPICILLIN <=2 SENSITIVE Sensitive     NITROFURANTOIN <=16 SENSITIVE Sensitive     VANCOMYCIN 1 SENSITIVE Sensitive     * >=100,000 COLONIES/mL ENTEROCOCCUS FAECALIS  Respiratory Panel by RT PCR (Flu A&B, Covid) - Nasopharyngeal Swab     Status: None   Collection Time: 11/04/19  4:44 AM   Specimen: Nasopharyngeal Swab  Result Value Ref Range Status   SARS Coronavirus 2 by RT PCR NEGATIVE NEGATIVE Final    Comment: (NOTE) SARS-CoV-2 target nucleic acids are NOT DETECTED. The SARS-CoV-2 RNA is generally detectable in upper respiratoy specimens during the acute phase of infection. The lowest concentration of SARS-CoV-2 viral copies this assay can detect is 131 copies/mL. A negative result does not preclude SARS-Cov-2 infection and should not be used as the sole basis for treatment or other patient management decisions. A negative result may occur with  improper specimen collection/handling, submission of specimen other than nasopharyngeal swab, presence of viral mutation(s) within the areas targeted by this assay, and inadequate number of viral copies (<131 copies/mL). A negative result must be combined with clinical observations, patient history, and epidemiological information. The expected result is Negative. Fact Sheet for Patients:  PinkCheek.be Fact Sheet for Healthcare Providers:  GravelBags.it This test is not yet ap proved or cleared by the [place] and  has been  authorized  for detection and/or diagnosis of SARS-CoV-2 by FDA under an Emergency Use Authorization (EUA). This EUA will remain  in effect (meaning this test can be used) for the duration of the COVID-19 declaration under Section 564(b)(1) of the Act, 21 U.S.C. section 360bbb-3(b)(1), unless the authorization is terminated or revoked sooner.    Influenza A by PCR NEGATIVE NEGATIVE Final   Influenza B by PCR NEGATIVE NEGATIVE Final    Comment: (NOTE) The Xpert Xpress SARS-CoV-2/FLU/RSV assay is intended as an aid in  the diagnosis of influenza from Nasopharyngeal swab specimens and  should not be used as a sole basis for treatment. Nasal washings and  aspirates are unacceptable for Xpert Xpress SARS-CoV-2/FLU/RSV  testing. Fact Sheet for Patients: PinkCheek.be Fact Sheet for Healthcare Providers: GravelBags.it This test is not yet approved or cleared by the [place] FDA and  has been authorized for detection and/or diagnosis of SARS-CoV-2 by  FDA under an Emergency Use Authorization (EUA). This EUA will remain  in effect (meaning this test can be used) for the duration of the  Covid-19 declaration under Section 564(b)(1) of the Act, 21  U.S.C. section 360bbb-3(b)(1), unless the authorization is  terminated or revoked. Performed at [facility], [place] [address], [place], [place] [address]   Surgical pcr screen     Status: None   Collection Time: 11/04/19  8:46 AM   Specimen: Nasal Mucosa; Nasal Swab  Result Value Ref Range Status   MRSA, PCR NEGATIVE NEGATIVE Final   Staphylococcus aureus NEGATIVE NEGATIVE Final    Comment: (NOTE) The Xpert SA Assay (FDA approved for NASAL specimens in patients 62 years of age and older), is one component of a comprehensive surveillance program. It is not intended to diagnose infection nor to guide or monitor treatment. Performed at [facility], [place]  [address] [place], [place] [address]   SARS CORONAVIRUS 2 (TAT 6-24 HRS) Nasopharyngeal Nasopharyngeal Swab     Status: None   Collection Time: 11/08/19  8:23 AM   Specimen: Nasopharyngeal Swab  Result Value Ref Range Status   SARS Coronavirus 2 NEGATIVE NEGATIVE Final    Comment: (NOTE) SARS-CoV-2 target nucleic acids are NOT DETECTED. The SARS-CoV-2 RNA is generally detectable in upper and lower respiratory specimens during the acute phase of infection. Negative results do not preclude SARS-CoV-2 infection, do not rule out co-infections with other pathogens, and should not be used as the sole basis for treatment or other patient management decisions. Negative results must be combined with clinical observations, patient history, and epidemiological information. The expected result is Negative. Fact Sheet for Patients: SugarRoll.be Fact Sheet for Healthcare Providers: https://www.woods-mathews.com/ This test is not yet approved or cleared by the [place] FDA and  has been authorized for detection and/or diagnosis of SARS-CoV-2 by FDA under an Emergency Use Authorization (EUA). This EUA will remain  in effect (meaning this test can be used) for the duration of the COVID-19 declaration under Section 56 4(b)(1) of the Act, 21 U.S.C. section 360bbb-3(b)(1), unless the authorization is terminated or revoked sooner. Performed at [facility], [place] [address] [place], [place] [address]    Time spent: 53min  SIGNED:   Marylu Lund, MD  [facility] 11/09/2019, 10:18 AM  If 7PM-7AM, please contact night-coverage

## 2019-11-09 NOTE — Progress Notes (Signed)
Inna RN form [employer] called me and report given

## 2019-11-09 NOTE — Progress Notes (Signed)
Report given to [facility] staff

## 2019-11-09 NOTE — Progress Notes (Signed)
Attempted to call report several times on [phone]. Call unanswered.

## 2019-11-09 NOTE — TOC Transition Note (Signed)
Transition of Care [facility]) - CM/SW Discharge Note   Patient Details  Name: Callisto Wendel MRN: [id number] Date of Birth: Oct 08, 1940  [facility] CM/SW Contact:  Bary Castilla, LCSW Phone Number: [phone] 11/09/2019, 1:09 PM   Clinical Narrative:    Patient will DC to:?[facility] Anticipated DC date:?11/09/19 Family notified:?[name] Transport by: Corey Harold   Per MD patient ready for DC to  [place]?. RN, patient, patient's family, and facility notified of DC. Discharge Summary sent to facility. RN given number for report  [id number] room 126 . DC packet on chart. Ambulance transport requested for patient.   CSW signing off.   [place], [place] [phone]    Final next level of care: Skilled Nursing Facility Barriers to Discharge: No Barriers Identified   Patient Goals and CMS Choice        Discharge Placement              Patient [facility] bed at: [facility]) Patient to be transferred to facility by: [place] Name of family member notified: [name] Patient and family notified of of transfer: 11/09/19  Discharge Plan and Services                                     Social Determinants of Health (SDOH) Interventions     Readmission Risk Interventions No flowsheet data found.

## 2019-11-09 NOTE — Progress Notes (Signed)
SATURATION QUALIFICATIONS: (This note is used to comply with regulatory documentation for home oxygen)  Patient Saturations on Room Air at Rest = 86%  Patient Saturations on 2 Liters of oxygen while at rest  = 92%

## 2019-11-09 NOTE — Progress Notes (Signed)
[facility] here to transport patient 

## 2019-11-09 NOTE — Plan of Care (Signed)
  Problem: Pain Managment: Goal: General experience of comfort will improve Outcome: Progressing   Problem: Safety: Goal: Ability to remain free from injury will improve Outcome: Progressing   Problem: Skin Integrity: Goal: Risk for impaired skin integrity will decrease Outcome: Progressing   

## 2019-11-10 ENCOUNTER — Encounter: Payer: Self-pay | Admitting: *Deleted

## 2019-11-10 LAB — GLUCOSE, CAPILLARY: Glucose-Capillary: 116 mg/dL — ABNORMAL HIGH (ref 70–99)

## 2019-11-11 ENCOUNTER — Ambulatory Visit: Payer: Medicare Other

## 2019-11-19 ENCOUNTER — Ambulatory Visit: Payer: Medicare Other

## 2019-11-22 ENCOUNTER — Ambulatory Visit: Payer: Medicare Other | Attending: Internal Medicine

## 2019-11-22 DIAGNOSIS — Z23 Encounter for immunization: Secondary | ICD-10-CM

## 2019-11-22 NOTE — Progress Notes (Signed)
   Covid-19 Vaccination Clinic  Name:  [name]    MRN: [id number] DOB: 06/24/1941  11/22/2019  [name] was observed post Covid-19 immunization for 15 minutes without incident. She was provided with Vaccine Information Sheet and instruction to access the V-Safe system.   [name] was instructed to call 911 with any severe reactions post vaccine: Marland Kitchen Difficulty breathing  . Swelling of face and throat  . A fast heartbeat  . A bad rash all over body  . Dizziness and weakness   Immunizations Administered    Name Date Dose VIS Date Route   Pfizer COVID-19 Vaccine 11/22/2019 12:05 PM 0.3 mL 07/25/2019 Intramuscular   Manufacturer: [place]   Lot: [id number]   [place]: [id number]

## 2019-12-11 ENCOUNTER — Other Ambulatory Visit: Payer: Self-pay

## 2019-12-11 ENCOUNTER — Encounter (HOSPITAL_COMMUNITY): Payer: Self-pay | Admitting: Emergency Medicine

## 2019-12-11 ENCOUNTER — Observation Stay (HOSPITAL_COMMUNITY)
Admission: EM | Admit: 2019-12-11 | Discharge: 2019-12-15 | Disposition: A | Discharge status: Skilled Nursing Facility | Payer: Medicare Other | Attending: Family Medicine | Admitting: Family Medicine

## 2019-12-11 ENCOUNTER — Emergency Department (HOSPITAL_COMMUNITY): Payer: Medicare Other

## 2019-12-11 DIAGNOSIS — I4510 Unspecified right bundle-branch block: Secondary | ICD-10-CM | POA: Diagnosis not present

## 2019-12-11 DIAGNOSIS — R2681 Unsteadiness on feet: Secondary | ICD-10-CM | POA: Insufficient documentation

## 2019-12-11 DIAGNOSIS — Z885 Allergy status to narcotic agent status: Secondary | ICD-10-CM | POA: Insufficient documentation

## 2019-12-11 DIAGNOSIS — E559 Vitamin D deficiency, unspecified: Secondary | ICD-10-CM | POA: Insufficient documentation

## 2019-12-11 DIAGNOSIS — Z8673 Personal history of transient ischemic attack (TIA), and cerebral infarction without residual deficits: Secondary | ICD-10-CM | POA: Diagnosis not present

## 2019-12-11 DIAGNOSIS — K219 Gastro-esophageal reflux disease without esophagitis: Secondary | ICD-10-CM | POA: Insufficient documentation

## 2019-12-11 DIAGNOSIS — I6782 Cerebral ischemia: Secondary | ICD-10-CM | POA: Diagnosis not present

## 2019-12-11 DIAGNOSIS — M109 Gout, unspecified: Secondary | ICD-10-CM | POA: Diagnosis not present

## 2019-12-11 DIAGNOSIS — F1010 Alcohol abuse, uncomplicated: Secondary | ICD-10-CM | POA: Insufficient documentation

## 2019-12-11 DIAGNOSIS — F329 Major depressive disorder, single episode, unspecified: Secondary | ICD-10-CM | POA: Insufficient documentation

## 2019-12-11 DIAGNOSIS — E785 Hyperlipidemia, unspecified: Secondary | ICD-10-CM | POA: Diagnosis not present

## 2019-12-11 DIAGNOSIS — I10 Essential (primary) hypertension: Secondary | ICD-10-CM | POA: Insufficient documentation

## 2019-12-11 DIAGNOSIS — Z8249 Family history of ischemic heart disease and other diseases of the circulatory system: Secondary | ICD-10-CM | POA: Insufficient documentation

## 2019-12-11 DIAGNOSIS — N6019 Diffuse cystic mastopathy of unspecified breast: Secondary | ICD-10-CM | POA: Insufficient documentation

## 2019-12-11 DIAGNOSIS — M1990 Unspecified osteoarthritis, unspecified site: Secondary | ICD-10-CM | POA: Diagnosis not present

## 2019-12-11 DIAGNOSIS — Z87442 Personal history of urinary calculi: Secondary | ICD-10-CM | POA: Diagnosis not present

## 2019-12-11 DIAGNOSIS — Z888 Allergy status to other drugs, medicaments and biological substances status: Secondary | ICD-10-CM | POA: Insufficient documentation

## 2019-12-11 DIAGNOSIS — I700 Atherosclerosis of aorta: Secondary | ICD-10-CM | POA: Insufficient documentation

## 2019-12-11 DIAGNOSIS — Z79899 Other long term (current) drug therapy: Secondary | ICD-10-CM | POA: Insufficient documentation

## 2019-12-11 DIAGNOSIS — C50411 Malignant neoplasm of upper-outer quadrant of right female breast: Secondary | ICD-10-CM | POA: Diagnosis not present

## 2019-12-11 DIAGNOSIS — Z170 Estrogen receptor positive status [ER+]: Secondary | ICD-10-CM | POA: Diagnosis not present

## 2019-12-11 DIAGNOSIS — I480 Paroxysmal atrial fibrillation: Secondary | ICD-10-CM | POA: Insufficient documentation

## 2019-12-11 DIAGNOSIS — Z20822 Contact with and (suspected) exposure to covid-19: Secondary | ICD-10-CM | POA: Insufficient documentation

## 2019-12-11 DIAGNOSIS — Z90710 Acquired absence of both cervix and uterus: Secondary | ICD-10-CM | POA: Insufficient documentation

## 2019-12-11 DIAGNOSIS — E876 Hypokalemia: Secondary | ICD-10-CM | POA: Insufficient documentation

## 2019-12-11 DIAGNOSIS — G4700 Insomnia, unspecified: Secondary | ICD-10-CM | POA: Insufficient documentation

## 2019-12-11 DIAGNOSIS — R55 Syncope and collapse: Principal | ICD-10-CM | POA: Insufficient documentation

## 2019-12-11 DIAGNOSIS — Z823 Family history of stroke: Secondary | ICD-10-CM | POA: Insufficient documentation

## 2019-12-11 DIAGNOSIS — Z7901 Long term (current) use of anticoagulants: Secondary | ICD-10-CM | POA: Insufficient documentation

## 2019-12-11 DIAGNOSIS — Z96643 Presence of artificial hip joint, bilateral: Secondary | ICD-10-CM | POA: Insufficient documentation

## 2019-12-11 DIAGNOSIS — E538 Deficiency of other specified B group vitamins: Secondary | ICD-10-CM | POA: Insufficient documentation

## 2019-12-11 LAB — CBC
HCT: 44.4 % (ref 36.0–46.0)
Hemoglobin: 14.2 g/dL (ref 12.0–15.0)
MCH: 30.7 pg (ref 26.0–34.0)
MCHC: 32 g/dL (ref 30.0–36.0)
MCV: 96.1 fL (ref 80.0–100.0)
Platelets: 218 10*3/uL (ref 150–400)
RBC: 4.62 MIL/uL (ref 3.87–5.11)
RDW: 13.6 % (ref 11.5–15.5)
WBC: 8.9 10*3/uL (ref 4.0–10.5)
nRBC: 0 % (ref 0.0–0.2)

## 2019-12-11 LAB — URINALYSIS, ROUTINE W REFLEX MICROSCOPIC
Bilirubin Urine: NEGATIVE
Glucose, UA: NEGATIVE mg/dL
Hgb urine dipstick: NEGATIVE
Ketones, ur: NEGATIVE mg/dL
Nitrite: NEGATIVE
Protein, ur: NEGATIVE mg/dL
Specific Gravity, Urine: 1.008 (ref 1.005–1.030)
pH: 5 (ref 5.0–8.0)

## 2019-12-11 LAB — BASIC METABOLIC PANEL
Anion gap: 14 (ref 5–15)
BUN: 7 mg/dL — ABNORMAL LOW (ref 8–23)
CO2: 23 mmol/L (ref 22–32)
Calcium: 8.8 mg/dL — ABNORMAL LOW (ref 8.9–10.3)
Chloride: 102 mmol/L (ref 98–111)
Creatinine, Ser: 0.66 mg/dL (ref 0.44–1.00)
GFR calc Af Amer: >60 mL/min (ref >60)
GFR calc non Af Amer: >60 mL/min (ref >60)
Glucose, Bld: 128 mg/dL — ABNORMAL HIGH (ref 70–99)
Potassium: 3.4 mmol/L — ABNORMAL LOW (ref 3.5–5.1)
Sodium: 139 mmol/L (ref 135–145)

## 2019-12-11 LAB — HEPATIC FUNCTION PANEL
ALT: 13 U/L (ref 0–44)
AST: 16 U/L (ref 15–41)
Albumin: 3.6 g/dL (ref 3.5–5.0)
Alkaline Phosphatase: 142 U/L — ABNORMAL HIGH (ref 38–126)
Bilirubin, Direct: 0.2 mg/dL (ref 0.0–0.2)
Indirect Bilirubin: 0.3 mg/dL (ref 0.3–0.9)
Total Bilirubin: 0.5 mg/dL (ref 0.3–1.2)
Total Protein: 6.8 g/dL (ref 6.5–8.1)

## 2019-12-11 LAB — CBG MONITORING, ED: Glucose-Capillary: 126 mg/dL — ABNORMAL HIGH (ref 70–99)

## 2019-12-11 LAB — MAGNESIUM: Magnesium: 1.7 mg/dL (ref 1.7–2.4)

## 2019-12-11 LAB — TROPONIN I (HIGH SENSITIVITY): Troponin I (High Sensitivity): 4 ng/L (ref <18)

## 2019-12-11 MED ORDER — SODIUM CHLORIDE 0.9% FLUSH
3.0000 mL | Freq: Once | INTRAVENOUS | Status: AC
  Administered 2019-12-14: 3 mL via INTRAVENOUS

## 2019-12-11 MED ORDER — POTASSIUM CHLORIDE CRYS ER 20 MEQ PO TBCR
40.0000 meq | EXTENDED_RELEASE_TABLET | Freq: Once | ORAL | Status: AC
  Administered 2019-12-11: 40 meq via ORAL
  Filled 2019-12-11: qty 2

## 2019-12-11 NOTE — ED Triage Notes (Signed)
Pt arrives via EMS from home with reports of 2 syncopal episodes today. Family reports frequent syncopal episodes but have gotten more frequently. Walks with walker at baseline.

## 2019-12-11 NOTE — ED Provider Notes (Signed)
[facility] EMERGENCY DEPARTMENT Provider Note   CSN: [id number] Arrival date & time: 12/11/19  1624   History Chief Complaint  Patient presents with  . Loss of Consciousness    [name] is a 79 y.o. female.  The history is provided by the patient and a relative.  Loss of Consciousness She has history of hypertension, hyperlipidemia, paroxysmal atrial fibrillation currently not on any anticoagulants and comes in because of 2 syncopal episodes today.  At about 3 AM, she woke up to go to the bathroom and passed out just that she got back to her bed.  This afternoon, she got up to get something from the kitchen and passed out.  She is uncertain about duration of loss of consciousness.  She had a similar syncopal episode last month and suffered a hip fracture.  She went to a rehab center following this and did have a syncopal episode while there.  She has no prodrome and denies chest pain or palpitations.  There was some mild nausea and some diaphoresis.  Past Medical History:  Diagnosis Date  . Alcohol abuse   . Anemia   . Arthritis   . Bacterial vaginosis   . Cancer ([place]) 01/2015   Breast cancer right   . Cerebrovascular accident (stroke) ([place]) 2/07   left frontal lobe  . Closed displaced fracture of right femoral neck ([place]) 11/04/2019  . Closed left hip fracture ([place]) 5/14  . Complication of anesthesia    woke up too early  with kidney stone surgery and wrist surgery  . Depression   . Dupuytren's contracture   . Fibrocystic breast disease   . Fracture of metatarsal bone of right foot    5th metatarsal  . GERD (gastroesophageal reflux disease)   . Gout   . Headache   . History of blood transfusion   . Hyperlipidemia LDL goal <70   . Hypertension   . Paroxysmal A-fib ([place])   . Renal calculus   . Vitamin B12 deficiency   . Vitamin D deficiency     Patient Active Problem List   Diagnosis Date Noted  . Closed displaced fracture of right femoral  neck ([place]) 11/04/2019  . Hypokalemia 11/04/2019  . Palpitations 11/19/2017  . Atypical chest pain 11/19/2017  . Pleuritic chest pain 10/09/2017  . Hypoxia 10/09/2017  . Appendicitis with perforation   . Acute appendicitis 10/06/2017  . Fall 04/10/2017  . UTI (urinary tract infection) 04/10/2017  . Accelerated hypertension 04/10/2017  . Malignant neoplasm of upper-outer quadrant of right breast in female, estrogen receptor positive ([place]) 01/07/2016  . Stroke ([place]) 06/05/2014  . Alcohol dependence ([place]) 06/05/2014  . Dyslipidemia 06/05/2014  . Syncope 06/04/2014  . Postoperative anemia due to acute blood loss 12/19/2012  . Osteoporosis 12/19/2012  . Osteoporosis with fracture 12/19/2012  . Hip fracture, left ([place]) 12/16/2012  . Gout 12/16/2012  . fracture of fifth right metatarsal bone 12/16/2012  . Alcohol abuse 12/16/2012  . GERD (gastroesophageal reflux disease) 12/16/2012  . PAF (paroxysmal atrial fibrillation) ([place]) 12/16/2012  . B12 deficiency 12/16/2012  . Vitamin D insufficiency 12/16/2012  . Esophageal stricture 12/16/2012  . Other and unspecified hyperlipidemia 12/16/2012  . Reactive depression (situational) 12/16/2012  . Insomnia 12/16/2012  . H/O: CVA (cerebrovascular accident) 12/16/2012  . Dupuytren's contracture of both hands 12/16/2012  . Fibrocystic breast disease 12/16/2012    Past Surgical History:  Procedure Laterality Date  . ABDOMINAL HYSTERECTOMY    . cataracts Bilateral   .  COLONOSCOPY    . ESOPHAGEAL MANOMETRY N/A 08/15/2017   Procedure: ESOPHAGEAL MANOMETRY (EM);  Surgeon: Ronnette Juniper, MD;  Location: [facility];  Service: Gastroenterology;  Laterality: N/A;  . ESOPHAGOGASTRODUODENOSCOPY (EGD) WITH PROPOFOL N/A 05/02/2016   Procedure: ESOPHAGOGASTRODUODENOSCOPY (EGD) WITH PROPOFOL;  Surgeon: Garlan Fair, MD;  Location: [facility];  Service: Endoscopy;  Laterality: N/A;  . HIP ARTHROPLASTY Left 12/17/2012   Procedure: LEFT HIP HEMI ARTHROPLASTY  ;  Surgeon: Rozanna Box, MD;  Location: [facility];  Service: Orthopedics;  Laterality: Left;  . HIP ARTHROPLASTY Right 11/04/2019   Procedure: ARTHROPLASTY BIPOLAR HIP (HEMIARTHROPLASTY);  Surgeon: Altamese [place], MD;  Location: [facility];  Service: Orthopedics;  Laterality: Right;  . LAPAROSCOPIC APPENDECTOMY N/A 10/05/2017   Procedure: APPENDECTOMY LAPAROSCOPIC;  Surgeon: Greer Pickerel, MD;  Location: [facility];  Service: General;  Laterality: N/A;  . left wrist surgery surgery  7 yrs ago  . LEG SURGERY Left   . LITHOTRIPSY    . MASTECTOMY W/ SENTINEL NODE BIOPSY Bilateral 01/20/2016   Procedure: BILATERAL TOTAL MASTECTOMY WITH RIGHT SENTINEL LYMPH NODE BIOPSY;  Surgeon: Rolm Bookbinder, MD;  Location: [facility];  Service: General;  Laterality: Bilateral;  . OPEN REDUCTION INTERNAL FIXATION (ORIF) DISTAL RADIAL FRACTURE Right 11/01/2018   Procedure: OPEN REDUCTION INTERNAL FIXATION (ORIF)  RIGHT DISTAL RADIAL FRACTURE;  Surgeon: Altamese [place], MD;  Location: [facility];  Service: Orthopedics;  Laterality: Right;     OB History   No obstetric history on file.     Family History  Problem Relation Age of Onset  . Stroke Mother   . Hypertension Father   . CAD Father     Social History   Tobacco Use  . Smoking status: Never Smoker  . Smokeless tobacco: Current User    Types: Chew  Substance Use Topics  . Alcohol use: Yes    Alcohol/week: 21.0 standard drinks    Types: 21 Cans of beer per week    Comment:  a little liquor   . Drug use: No    Home Medications Prior to Admission medications   Medication Sig Start Date End Date Taking? Authorizing Provider  acetaminophen (TYLENOL) 325 MG tablet Take 325 mg by mouth every 6 (six) hours as needed for moderate pain.    [provider]  albuterol (VENTOLIN HFA) 108 (90 Base) MCG/ACT inhaler Inhale 1-2 puffs into the lungs every 6 (six) hours as needed for wheezing or shortness of breath.    [provider]  ascorbic acid (VITAMIN C)  500 MG tablet Take 1 tablet (500 mg total) by mouth daily. 11/07/19 01/06/20  Ainsley Spinner, PA-C  cholecalciferol (VITAMIN D) 25 MCG tablet Take 2 tablets (2,000 Units total) by mouth 2 (two) times daily. 11/07/19 02/05/20  Ainsley Spinner, PA-C  Cyanocobalamin (VITAMIN B-12 IJ) Inject 1 application as directed every 30 (thirty) days.    [provider]  docusate sodium (COLACE) 100 MG capsule Take 1 capsule (100 mg total) by mouth 2 (two) times daily. 11/09/19   Donne Hazel, MD  enoxaparin (LOVENOX) 40 MG/0.4ML injection Inject 0.4 mLs (40 mg total) into the skin daily for 21 days. 11/07/19 11/28/19  Ainsley Spinner, PA-C  ipratropium-albuterol (DUONEB) 0.5-2.5 (3) MG/3ML SOLN Take 3 mLs by nebulization every 4 (four) hours as needed. 11/09/19   Donne Hazel, MD  metoprolol succinate (TOPROL-XL) 25 MG 24 hr tablet Take 25 mg by mouth daily.    [provider]  Multiple Vitamin (MULTIVITAMIN WITH MINERALS)  TABS tablet Take 1 tablet by mouth daily. 11/07/19 05/05/20  Ainsley Spinner, PA-C  nitroGLYCERIN (NITROSTAT) 0.4 MG SL tablet Place 1 tablet under the tongue every 5 (five) minutes as needed for chest pain.  11/13/17   [provider]  omeprazole (PRILOSEC) 20 MG capsule Take 1 capsule by mouth 2 (two) times daily as needed (heart burn).  10/24/18   [provider]  oxyCODONE (OXY IR/ROXICODONE) 5 MG immediate release tablet Take 1 tablet (5 mg total) by mouth every 6 (six) hours as needed for moderate pain or severe pain. 11/07/19   Ainsley Spinner, PA-C  traMADol (ULTRAM) 50 MG tablet Take 1 tablet (50 mg total) by mouth every 8 (eight) hours as needed (mild or moderate pain). 11/07/19   Ainsley Spinner, PA-C  vitamin B-12 (CYANOCOBALAMIN) 1000 MCG tablet Take 1,000 mcg by mouth daily.    [provider]    Allergies    Vicodin [hydrocodone-acetaminophen] and Ambien [zolpidem tartrate]  Review of Systems   Review of Systems  Cardiovascular: Positive for syncope.  All other  systems reviewed and are negative.   Physical Exam Updated Vital Signs BP 129/75 (BP Location: Left Arm)   Pulse 68   Temp 98.4 F (36.9 C) (Oral)   Resp 14   SpO2 98%   Physical Exam Vitals and nursing note reviewed.   79 year old female, resting comfortably and in no acute distress. Vital signs are normal. Oxygen saturation is 98%, which is normal. Head is normocephalic and atraumatic. PERRLA, EOMI. Oropharynx is clear. Neck is nontender and supple without adenopathy or JVD.  There are no carotid bruits. Back is nontender and there is no CVA tenderness. Lungs are clear without rales, wheezes, or rhonchi. Chest is nontender. Heart has regular rate and rhythm without murmur. Abdomen is soft, flat, nontender without masses or hepatosplenomegaly and peristalsis is normoactive. Extremities have no cyanosis or edema, full range of motion is present. Skin is warm and dry without rash. Neurologic: Mental status is normal, cranial nerves are intact, there are no motor or sensory deficits.  ED Results / Procedures / Treatments   Labs (all labs ordered are listed, but only abnormal results are displayed) Labs Reviewed  BASIC METABOLIC PANEL - Abnormal; Notable for the following components:      Result Value   Potassium 3.4 (*)    Glucose, Bld 128 (*)    BUN 7 (*)    Calcium 8.8 (*)    All other components within normal limits  CBG MONITORING, ED - Abnormal; Notable for the following components:   Glucose-Capillary 126 (*)    All other components within normal limits  CBC  URINALYSIS, ROUTINE W REFLEX MICROSCOPIC  HEPATIC FUNCTION PANEL  MAGNESIUM  TROPONIN I (HIGH SENSITIVITY)    EKG EKG Interpretation  Date/Time:  Thursday December 11 2019 16:57:04 EDT Ventricular Rate:  64 PR Interval:  138 QRS Duration: 130 QT Interval:  460 QTC Calculation: 474 R Axis:   72 Text Interpretation: Normal sinus rhythm Right bundle branch block Abnormal ECG When compared with ECG of  11/06/2019, No significant change was found Confirmed by Delora Fuel ([id number]) on 12/11/2019 8:26:53 PM   Radiology No results found.  Procedures Procedures   Medications Ordered in ED Medications  sodium chloride flush (NS) 0.9 % injection 3 mL (has no administration in time range)    ED Course  I have reviewed the triage vital signs and the nursing notes.  Pertinent labs & imaging results that  were available during my care of the patient were reviewed by me and considered in my medical decision making (see chart for details).  MDM Rules/Calculators/A&P Recurrent syncopal episodes.  Old records reviewed confirming hospitalization for fall with hip fracture on 3/23 and inpatient work-up of syncope was unrevealing.  ECG today is unchanged.  There is mild hypokalemia and she will be given oral potassium.  We will also check magnesium and hepatic functions and troponin.  Will send for CT of head.  She was discharged on a 21-day course of enoxaparin which would have been completed on April 18.  Will screen for pulmonary embolism with D-dimer.  CT of head shows no acute process.  Troponin and hepatic functions are normal, D-dimer still pending.  Case is signed out to Dr. Randal Buba.  Final Clinical Impression(s) / ED Diagnoses Final diagnoses:  Syncope, unspecified syncope type  Hypokalemia    Rx / DC Orders ED Discharge Orders    None       Delora Fuel, MD [id number]

## 2019-12-11 NOTE — ED Notes (Signed)
Pt gone to CT will come back for blood draw

## 2019-12-12 ENCOUNTER — Emergency Department (HOSPITAL_COMMUNITY): Payer: Medicare Other

## 2019-12-12 ENCOUNTER — Encounter (HOSPITAL_COMMUNITY): Payer: Self-pay | Admitting: Internal Medicine

## 2019-12-12 DIAGNOSIS — R55 Syncope and collapse: Secondary | ICD-10-CM

## 2019-12-12 DIAGNOSIS — Z8673 Personal history of transient ischemic attack (TIA), and cerebral infarction without residual deficits: Secondary | ICD-10-CM

## 2019-12-12 DIAGNOSIS — I480 Paroxysmal atrial fibrillation: Secondary | ICD-10-CM

## 2019-12-12 DIAGNOSIS — F1010 Alcohol abuse, uncomplicated: Secondary | ICD-10-CM

## 2019-12-12 LAB — RESPIRATORY PANEL BY RT PCR (FLU A&B, COVID)
Influenza A by PCR: NEGATIVE
Influenza B by PCR: NEGATIVE
SARS Coronavirus 2 by RT PCR: NEGATIVE

## 2019-12-12 LAB — CBC
HCT: 51.1 % — ABNORMAL HIGH (ref 36.0–46.0)
Hemoglobin: 16.3 g/dL — ABNORMAL HIGH (ref 12.0–15.0)
MCH: 31.4 pg (ref 26.0–34.0)
MCHC: 31.9 g/dL (ref 30.0–36.0)
MCV: 98.5 fL (ref 80.0–100.0)
Platelets: 235 10*3/uL (ref 150–400)
RBC: 5.19 MIL/uL — ABNORMAL HIGH (ref 3.87–5.11)
RDW: 14 % (ref 11.5–15.5)
WBC: 6.1 10*3/uL (ref 4.0–10.5)
nRBC: 0 % (ref 0.0–0.2)

## 2019-12-12 LAB — CREATININE, SERUM
Creatinine, Ser: 0.58 mg/dL (ref 0.44–1.00)
GFR calc Af Amer: >60 mL/min (ref >60)
GFR calc non Af Amer: >60 mL/min (ref >60)

## 2019-12-12 LAB — D-DIMER, QUANTITATIVE: D-Dimer, Quant: 1.41 ug/mL-FEU — ABNORMAL HIGH (ref 0.00–0.50)

## 2019-12-12 MED ORDER — NITROGLYCERIN 0.4 MG SL SUBL: 0.4000 mg | SUBLINGUAL_TABLET | SUBLINGUAL | Status: DC | PRN

## 2019-12-12 MED ORDER — ONDANSETRON HCL 4 MG PO TABS: 4.0000 mg | ORAL_TABLET | Freq: Four times a day (QID) | ORAL | Status: DC | PRN

## 2019-12-12 MED ORDER — THIAMINE HCL 100 MG PO TABS
100.0000 mg | ORAL_TABLET | Freq: Every day | ORAL | Status: DC
  Administered 2019-12-12 – 2019-12-15 (×4): 100 mg via ORAL
  Filled 2019-12-12 (×4): qty 1

## 2019-12-12 MED ORDER — METOPROLOL SUCCINATE ER 25 MG PO TB24
25.0000 mg | ORAL_TABLET | Freq: Every day | ORAL | Status: DC
  Administered 2019-12-12 – 2019-12-15 (×4): 25 mg via ORAL
  Filled 2019-12-12 (×4): qty 1

## 2019-12-12 MED ORDER — SODIUM CHLORIDE 0.9 % IV SOLN
1.0000 g | INTRAVENOUS | Status: DC
  Administered 2019-12-13 – 2019-12-15 (×3): 1 g via INTRAVENOUS
  Filled 2019-12-12: qty 10
  Filled 2019-12-12 (×2): qty 1

## 2019-12-12 MED ORDER — ACETAMINOPHEN 325 MG PO TABS
650.0000 mg | ORAL_TABLET | Freq: Four times a day (QID) | ORAL | Status: DC | PRN
  Administered 2019-12-12 – 2019-12-15 (×7): 650 mg via ORAL
  Filled 2019-12-12 (×7): qty 2

## 2019-12-12 MED ORDER — ADULT MULTIVITAMIN W/MINERALS CH
1.0000 | ORAL_TABLET | Freq: Every day | ORAL | Status: DC
  Administered 2019-12-12 – 2019-12-15 (×4): 1 via ORAL
  Filled 2019-12-12 (×4): qty 1

## 2019-12-12 MED ORDER — SODIUM CHLORIDE 0.9 % IV SOLN
1.0000 g | Freq: Once | INTRAVENOUS | Status: AC
  Administered 2019-12-12: 1 g via INTRAVENOUS
  Filled 2019-12-12: qty 10

## 2019-12-12 MED ORDER — LORAZEPAM 2 MG/ML IJ SOLN: 1.0000 mg | INTRAMUSCULAR | Status: AC | PRN

## 2019-12-12 MED ORDER — LORAZEPAM 1 MG PO TABS: 1.0000 mg | ORAL_TABLET | ORAL | Status: AC | PRN

## 2019-12-12 MED ORDER — FOLIC ACID 1 MG PO TABS
1.0000 mg | ORAL_TABLET | Freq: Every day | ORAL | Status: DC
  Administered 2019-12-12 – 2019-12-15 (×4): 1 mg via ORAL
  Filled 2019-12-12 (×4): qty 1

## 2019-12-12 MED ORDER — ONDANSETRON HCL 4 MG/2ML IJ SOLN: 4.0000 mg | Freq: Four times a day (QID) | INTRAMUSCULAR | Status: DC | PRN

## 2019-12-12 MED ORDER — DOCUSATE SODIUM 100 MG PO CAPS
100.0000 mg | ORAL_CAPSULE | Freq: Two times a day (BID) | ORAL | Status: DC
  Administered 2019-12-12 – 2019-12-13 (×3): 100 mg via ORAL
  Filled 2019-12-12 (×6): qty 1

## 2019-12-12 MED ORDER — THIAMINE HCL 100 MG/ML IJ SOLN: 100.0000 mg | Freq: Every day | INTRAMUSCULAR | Status: DC

## 2019-12-12 MED ORDER — ENOXAPARIN SODIUM 40 MG/0.4ML ~~LOC~~ SOLN
40.0000 mg | SUBCUTANEOUS | Status: DC
  Administered 2019-12-12 – 2019-12-14 (×3): 40 mg via SUBCUTANEOUS
  Filled 2019-12-12 (×3): qty 0.4

## 2019-12-12 MED ORDER — IOHEXOL 350 MG/ML SOLN
100.0000 mL | Freq: Once | INTRAVENOUS | Status: AC | PRN
  Administered 2019-12-12: 100 mL via INTRAVENOUS

## 2019-12-12 NOTE — ED Notes (Signed)
Breakfast Tray Ordered. 

## 2019-12-12 NOTE — Discharge Summary (Addendum)
Physician Discharge Summary  [name] [id number] DOB: 1941-01-28 DOA: 12/11/2019  PCP: Josetta Huddle, MD  Admit date: 12/11/2019 Discharge date: 12/12/2019  Admitted From: Home Disposition: Home  Recommendations for Outpatient Follow-up:  1. Follow up with PCP in 1-2 weeks 2. Please obtain BMP/CBC in one week  Discharge Condition: Stable CODE STATUS: Full Diet recommendation: As tolerated  Brief/Interim Summary: [name] is a 79 y.o. female with history of paroxysmal atrial fibrillation alcohol abuse tremors history of breast cancer who had a recent right hip surgery and was discharged to rehab eventually being discharged back home was brought to the ER after patient had two syncopal episode at home.  Patient states the first episode happened when patient was walking to the bathroom and coming back at around 4:30 AM.  She felt slightly diaphoretic and passed out.  Did not hit her head at that time.  The second episode happened later and at that time which patient states she may have hit her head.  Denies any incontinence of urine or bowel.  Admits to drinking alcohol usually 2-3 beers a day. Denies any chest pain shortness breath nausea vomiting or diarrhea.  Uses a walker to walk after the surgery. In the ER patient was not orthostatic blood pressure mildly elevated.  EKG shows normal sinus rhythm with RBBB.  Labs show potassium of 3.4 high sensitive troponin was negative D-dimer was mildly elevated CT angiogram of the chest was negative for pulmonary embolism CT head is unremarkable.  CBC unremarkable.  Covid test was negative  Patient admitted as above after multiple episodes of syncope at home, appear to be after standing to walk consistent with orthostatic syncope. Patient admits to very poor p.o. intake since being discharged from rehab previously after recent hip fracture and repair.  We discussed the need for ongoing adequate p.o. intake especially liquids and that she  should stop drinking alcohol which only increases her fall risk which is more elevated than usual given her recent surgery.  She continues to use a walker as discussed. Patient's imaging otherwise is unremarkable, CT angio of the chest unremarkable; CT head no acute findings, labs remain without abnormality other than elevated hemoglobin likely consistent with dehydration given hemoconcentration.  At admission patient was initiated on antibiotics for presumed UTI but patient refutes any symptoms and UA is unremarkable. At this time patient is otherwise stable and agreeable for discharge, will need close follow-up with PCP in the next 3 to 5 days to ensure safe disposition and to further evaluate to ensure resolution of symptoms in the setting of poor p.o. intake and dehydration.  Patient COVID-19 swab is negative.  Patient evaluated by physical therapy this afternoon, patient currently requesting evaluation for SNF placement despite our discussion earlier about discharge home, PT agrees recommends SNF placement due to ongoing balance issues and weakness.  Case management aware, already working on disposition and approval.  Hopefully patient can discharge to SNF in the next 12 to 24 hours.  Discharge Diagnoses:  Principal Problem:   Syncope Active Problems:   Alcohol abuse   PAF (paroxysmal atrial fibrillation) (HCC)   H/O: CVA (cerebrovascular accident)   Malignant neoplasm of upper-outer quadrant of right breast in female, estrogen receptor positive [facility])    Discharge Instructions  Discharge Instructions    Call MD for:  difficulty breathing, headache or visual disturbances   Complete by: As directed    Call MD for:  extreme fatigue   Complete by: As directed  Call MD for:  hives   Complete by: As directed    Call MD for:  persistant dizziness or light-headedness   Complete by: As directed    Call MD for:  persistant nausea and vomiting   Complete by: As directed    Call MD for:   severe uncontrolled pain   Complete by: As directed    Call MD for:  temperature >100.4   Complete by: As directed    Diet - low sodium heart healthy   Complete by: As directed    Increase activity slowly   Complete by: As directed      Allergies as of 12/12/2019      Reactions   Vicodin [hydrocodone-acetaminophen]    UNSPECIFIED REACTION    Ambien [zolpidem Tartrate] Other (See Comments)   Causes nightmares      Medication List    STOP taking these medications   enoxaparin 40 MG/0.4ML injection Commonly known as: LOVENOX   oxyCODONE 5 MG immediate release tablet Commonly known as: Oxy IR/ROXICODONE   traMADol 50 MG tablet Commonly known as: ULTRAM     TAKE these medications   acetaminophen 325 MG tablet Commonly known as: TYLENOL Take 325 mg by mouth every 6 (six) hours as needed for moderate pain.   ascorbic acid 500 MG tablet Commonly known as: VITAMIN C Take 1 tablet (500 mg total) by mouth daily.   docusate sodium 100 MG capsule Commonly known as: COLACE Take 1 capsule (100 mg total) by mouth 2 (two) times daily.   ipratropium-albuterol 0.5-2.5 (3) MG/3ML Soln Commonly known as: DUONEB Take 3 mLs by nebulization every 4 (four) hours as needed. What changed: reasons to take this   metoprolol succinate 25 MG 24 hr tablet Commonly known as: TOPROL-XL Take 25 mg by mouth daily.   multivitamin with minerals Tabs tablet Take 1 tablet by mouth daily.   nitroGLYCERIN 0.4 MG SL tablet Commonly known as: NITROSTAT Place 1 tablet under the tongue every 5 (five) minutes as needed for chest pain.   VITAMIN 0000000 IJ Inject 1 application as directed every 30 (thirty) days.   vitamin B-12 1000 MCG tablet Commonly known as: CYANOCOBALAMIN Take 1,000 mcg by mouth daily.   Vitamin D3 25 MCG tablet Commonly known as: Vitamin D Take 2 tablets (2,000 Units total) by mouth 2 (two) times daily.       Allergies  Allergen Reactions  . Vicodin  [Hydrocodone-Acetaminophen]     UNSPECIFIED REACTION   . Ambien [Zolpidem Tartrate] Other (See Comments)    Causes nightmares    Procedures/Studies: CT Head Wo Contrast  Result Date: 12/11/2019 CLINICAL DATA:  Syncope EXAM: CT HEAD WITHOUT CONTRAST TECHNIQUE: Contiguous axial images were obtained from the base of the skull through the vertex without intravenous contrast. COMPARISON:  CT brain 11/04/2019 FINDINGS: Brain: No acute territorial infarction, hemorrhage or intracranial mass. Atrophy and mild chronic small vessel ischemic change of the white matter. Chronic lacunar infarct within the left white matter. Stable ventricle size. Vascular: No hyperdense vessels.  Carotid vascular calcification Skull: Normal. Negative for fracture or focal lesion. Sinuses/Orbits: No acute finding. Other: None IMPRESSION: 1. No CT evidence for acute intracranial abnormality. 2. Atrophy and mild chronic small vessel ischemic change of the white matter Electronically Signed   By: Donavan Foil M.D.   On: 12/11/2019 20:59   CT Angio Chest PE W and/or Wo Contrast  Result Date: 12/12/2019 CLINICAL DATA:  Syncope EXAM: CT ANGIOGRAPHY CHEST WITH CONTRAST TECHNIQUE: Multidetector  CT imaging of the chest was performed using the standard protocol during bolus administration of intravenous contrast. Multiplanar CT image reconstructions and MIPs were obtained to evaluate the vascular anatomy. CONTRAST:  154mL OMNIPAQUE IOHEXOL 350 MG/ML SOLN COMPARISON:  11/06/2019 CTA chest FINDINGS: Cardiovascular: Contrast injection is sufficient to demonstrate satisfactory opacification of the pulmonary arteries to the segmental level. There is no pulmonary embolus or evidence of right heart strain. The size of the main pulmonary artery is normal. Heart size is normal, with no pericardial effusion. The course and caliber of the aorta are normal. There is mild atherosclerotic calcification. Opacification decreased due to pulmonary arterial  phase contrast bolus timing. Mediastinum/Nodes: No mediastinal, hilar or axillary lymphadenopathy. Normal visualized thyroid. Thoracic esophageal course is normal. Lungs/Pleura: Airways are patent. No pleural effusion, lobar consolidation, pneumothorax or pulmonary infarction. Upper Abdomen: Contrast bolus timing is not optimized for evaluation of the abdominal organs. The visualized portions of the organs of the upper abdomen are normal. Musculoskeletal: No chest wall abnormality. No bony spinal canal stenosis. Review of the MIP images confirms the above findings. IMPRESSION: No pulmonary embolus or other acute thoracic abnormality. Aortic Atherosclerosis (ICD10-I70.0). Electronically Signed   By: Ulyses Jarred M.D.   On: 12/12/2019 02:06     Subjective: No acute issues or events since admission, denies any further episodes of syncope presyncope or lightheadedness with exertion physician changes.  Denies headache, fever, chills, nausea, vomiting, diarrhea, constipation.   Discharge Exam: Vitals:   12/12/19 1203 12/12/19 1210  BP: (!) 181/83 (!) 163/89  Pulse: 63   Resp: 16   Temp: 98.6 F (37 C)   SpO2: 98%    Vitals:   12/12/19 1000 12/12/19 1100 12/12/19 1203 12/12/19 1210  BP: (!) 168/83 (!) 157/96 (!) 181/83 (!) 163/89  Pulse: 68 63 63   Resp: 15 14 16    Temp:   98.6 F (37 C)   TempSrc:   Oral   SpO2: 97% 99% 98%     General:  Pleasantly resting in bed, No acute distress. HEENT:  Normocephalic atraumatic.  Sclerae nonicteric, noninjected.  Extraocular movements intact bilaterally. Neck:  Without mass or deformity.  Trachea is midline. Lungs:  Clear to auscultate bilaterally without rhonchi, wheeze, or rales. Heart:  Regular rate and rhythm.  Without murmurs, rubs, or gallops. Abdomen:  Soft, nontender, nondistended.  Without guarding or rebound. Extremities: Without cyanosis, clubbing, edema, or obvious deformity. Vascular:  Dorsalis pedis and posterior tibial pulses palpable  bilaterally. Skin:  Warm and dry, no erythema, no ulcerations.   The results of significant diagnostics from this hospitalization (including imaging, microbiology, ancillary and laboratory) are listed below for reference.     Microbiology: Recent Results (from the past 240 hour(s))  Respiratory Panel by RT PCR (Flu A&B, Covid) - Nasopharyngeal Swab     Status: None   Collection Time: 12/12/19  2:56 AM   Specimen: Nasopharyngeal Swab  Result Value Ref Range Status   SARS Coronavirus 2 by RT PCR NEGATIVE NEGATIVE Final    Comment: (NOTE) SARS-CoV-2 target nucleic acids are NOT DETECTED. The SARS-CoV-2 RNA is generally detectable in upper respiratoy specimens during the acute phase of infection. The lowest concentration of SARS-CoV-2 viral copies this assay can detect is 131 copies/mL. A negative result does not preclude SARS-Cov-2 infection and should not be used as the sole basis for treatment or other patient management decisions. A negative result may occur with  improper specimen collection/handling, submission of specimen other than nasopharyngeal swab, presence  of viral mutation(s) within the areas targeted by this assay, and inadequate number of viral copies (<131 copies/mL). A negative result must be combined with clinical observations, patient history, and epidemiological information. The expected result is Negative. Fact Sheet for Patients:  PinkCheek.be Fact Sheet for Healthcare Providers:  GravelBags.it This test is not yet ap proved or cleared by the [place] FDA and  has been authorized for detection and/or diagnosis of SARS-CoV-2 by FDA under an Emergency Use Authorization (EUA). This EUA will remain  in effect (meaning this test can be used) for the duration of the COVID-19 declaration under Section 564(b)(1) of the Act, 21 U.S.C. section 360bbb-3(b)(1), unless the authorization is terminated or revoked  sooner.    Influenza A by PCR NEGATIVE NEGATIVE Final   Influenza B by PCR NEGATIVE NEGATIVE Final    Comment: (NOTE) The Xpert Xpress SARS-CoV-2/FLU/RSV assay is intended as an aid in  the diagnosis of influenza from Nasopharyngeal swab specimens and  should not be used as a sole basis for treatment. Nasal washings and  aspirates are unacceptable for Xpert Xpress SARS-CoV-2/FLU/RSV  testing. Fact Sheet for Patients: PinkCheek.be Fact Sheet for Healthcare Providers: GravelBags.it This test is not yet approved or cleared by the [place] FDA and  has been authorized for detection and/or diagnosis of SARS-CoV-2 by  FDA under an Emergency Use Authorization (EUA). This EUA will remain  in effect (meaning this test can be used) for the duration of the  Covid-19 declaration under Section 564(b)(1) of the Act, 21  U.S.C. section 360bbb-3(b)(1), unless the authorization is  terminated or revoked. Performed at [facility], [place] [address], [place], [place] [address]      Labs: BNP (last 3 results) Recent Labs    11/06/19 1830  BNP 123456*   Basic Metabolic Panel: Recent Labs  Lab 12/11/19 1640 12/11/19 2118 12/12/19 0542  NA 139  --   --   K 3.4*  --   --   CL 102  --   --   CO2 23  --   --   GLUCOSE 128*  --   --   BUN 7*  --   --   CREATININE 0.66  --  0.58  CALCIUM 8.8*  --   --   MG  --  1.7  --    Liver Function Tests: Recent Labs  Lab 12/11/19 2118  AST 16  ALT 13  ALKPHOS 142*  BILITOT 0.5  PROT 6.8  ALBUMIN 3.6   No results for input(s): LIPASE, AMYLASE in the last 168 hours. No results for input(s): AMMONIA in the last 168 hours. CBC: Recent Labs  Lab 12/11/19 1640 12/12/19 0542  WBC 8.9 6.1  HGB 14.2 16.3*  HCT 44.4 51.1*  MCV 96.1 98.5  PLT 218 235   Cardiac Enzymes: No results for input(s): CKTOTAL, CKMB, CKMBINDEX, TROPONINI in the last 168 hours. BNP: Invalid input(s):  POCBNP CBG: Recent Labs  Lab 12/11/19 1653  GLUCAP 126*   D-Dimer Recent Labs    12/12/19 0134  DDIMER 1.41*   Hgb A1c No results for input(s): HGBA1C in the last 72 hours. Lipid Profile No results for input(s): CHOL, HDL, LDLCALC, TRIG, CHOLHDL, LDLDIRECT in the last 72 hours. Thyroid function studies No results for input(s): TSH, T4TOTAL, T3FREE, THYROIDAB in the last 72 hours.  Invalid input(s): FREET3 Anemia work up No results for input(s): VITAMINB12, FOLATE, FERRITIN, TIBC, IRON, RETICCTPCT in the last 72 hours. Urinalysis  Component Value Date/Time   COLORURINE YELLOW 12/11/2019 2231   APPEARANCEUR CLEAR 12/11/2019 2231   LABSPEC 1.008 12/11/2019 2231   PHURINE 5.0 12/11/2019 2231   GLUCOSEU NEGATIVE 12/11/2019 2231   HGBUR NEGATIVE 12/11/2019 2231   BILIRUBINUR NEGATIVE 12/11/2019 2231   KETONESUR NEGATIVE 12/11/2019 2231   PROTEINUR NEGATIVE 12/11/2019 2231   UROBILINOGEN 0.2 09/11/2014 2309   NITRITE NEGATIVE 12/11/2019 2231   LEUKOCYTESUR SMALL (A) 12/11/2019 2231   Sepsis Labs Invalid input(s): PROCALCITONIN,  WBC,  LACTICIDVEN Microbiology Recent Results (from the past 240 hour(s))  Respiratory Panel by RT PCR (Flu A&B, Covid) - Nasopharyngeal Swab     Status: None   Collection Time: 12/12/19  2:56 AM   Specimen: Nasopharyngeal Swab  Result Value Ref Range Status   SARS Coronavirus 2 by RT PCR NEGATIVE NEGATIVE Final    Comment: (NOTE) SARS-CoV-2 target nucleic acids are NOT DETECTED. The SARS-CoV-2 RNA is generally detectable in upper respiratoy specimens during the acute phase of infection. The lowest concentration of SARS-CoV-2 viral copies this assay can detect is 131 copies/mL. A negative result does not preclude SARS-Cov-2 infection and should not be used as the sole basis for treatment or other patient management decisions. A negative result may occur with  improper specimen collection/handling, submission of specimen other than  nasopharyngeal swab, presence of viral mutation(s) within the areas targeted by this assay, and inadequate number of viral copies (<131 copies/mL). A negative result must be combined with clinical observations, patient history, and epidemiological information. The expected result is Negative. Fact Sheet for Patients:  PinkCheek.be Fact Sheet for Healthcare Providers:  GravelBags.it This test is not yet ap proved or cleared by the [place] FDA and  has been authorized for detection and/or diagnosis of SARS-CoV-2 by FDA under an Emergency Use Authorization (EUA). This EUA will remain  in effect (meaning this test can be used) for the duration of the COVID-19 declaration under Section 564(b)(1) of the Act, 21 U.S.C. section 360bbb-3(b)(1), unless the authorization is terminated or revoked sooner.    Influenza A by PCR NEGATIVE NEGATIVE Final   Influenza B by PCR NEGATIVE NEGATIVE Final    Comment: (NOTE) The Xpert Xpress SARS-CoV-2/FLU/RSV assay is intended as an aid in  the diagnosis of influenza from Nasopharyngeal swab specimens and  should not be used as a sole basis for treatment. Nasal washings and  aspirates are unacceptable for Xpert Xpress SARS-CoV-2/FLU/RSV  testing. Fact Sheet for Patients: PinkCheek.be Fact Sheet for Healthcare Providers: GravelBags.it This test is not yet approved or cleared by the [place] FDA and  has been authorized for detection and/or diagnosis of SARS-CoV-2 by  FDA under an Emergency Use Authorization (EUA). This EUA will remain  in effect (meaning this test can be used) for the duration of the  Covid-19 declaration under Section 564(b)(1) of the Act, 21  U.S.C. section 360bbb-3(b)(1), unless the authorization is  terminated or revoked. Performed at [facility], [place] [address], [place], [place] [address]      Time  coordinating discharge: Over 30 minutes  SIGNED:  Little Ishikawa, DO [facility] 12/12/2019, 3:04 PM Pager   If 7PM-7AM, please contact night-coverage [email or web address]

## 2019-12-12 NOTE — Plan of Care (Signed)
  Problem: Clinical Measurements: Goal: Ability to maintain clinical measurements within normal limits will improve Outcome: Progressing Goal: Will remain free from infection Outcome: Progressing Goal: Respiratory complications will improve Outcome: Progressing   

## 2019-12-12 NOTE — Care Management Obs Status (Signed)
[place] NOTIFICATION   Patient Details  Name: Flonnie Felt MRN: [id number] Date of Birth: 12/08/1940   Medicare Observation Status Notification Given:  Yes    Marilu Favre, RN 12/12/2019, 4:34 PM

## 2019-12-12 NOTE — Evaluation (Signed)
Physical Therapy Evaluation Patient Details Name: [name] MRN: [id number] DOB: 1941-02-28 Today's Date: 12/12/2019   History of Present Illness  [name] is a 79 y.o. female with history of paroxysmal atrial fibrillation alcohol abuse tremors history of breast cancer who had a recent right hip surgery and was discharged to rehab eventually being discharged back home was brought to the ER after patient had two syncopal episode at home.  Clinical Impression  Pt admitted with above diagnosis. Pt was able to ambulate with RW but needed min assist as she was unsteady.  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  Will continue to follow pt.  Pt currently with functional limitations due to the deficits listed below (see PT Problem List). Pt will benefit from skilled PT to increase their independence and safety with mobility to allow discharge to the venue listed below.    Sitting BP 175/91 with HR 64 bpm Standing 139/119, 61 bpm Standing after 3 min 179/108, 72 bpm  Follow Up Recommendations SNF;Supervision/Assistance - 24 hour    Equipment Recommendations  None recommended by PT    Recommendations for Other Services       Precautions / Restrictions Precautions Precautions: Fall Restrictions Weight Bearing Restrictions: No      Mobility  Bed Mobility Overal bed mobility: Independent                Transfers Overall transfer level: Needs assistance Equipment used: Rolling walker (2 wheeled) Transfers: Sit to/from Stand Sit to Stand: Min guard         General transfer comment: Needed cues for hand placement. Slightly unsteady and needed several attempts to stand from low bed.   Ambulation/Gait Ambulation/Gait assistance: Min guard;Min assist Gait Distance (Feet): 50 Feet Assistive device: Rolling walker (2 wheeled) Gait Pattern/deviations: Step-through  pattern;Decreased stride length;Staggering right;Staggering left   Gait velocity interpretation: <1.31 ft/sec, indicative of household ambulator General Gait Details: Pt needed assist for safety as she was stagfgering at times.  Pt also slihgtly orthostatic as well as suspect vestibular hypofunction on the rihgt.   Stairs            Wheelchair Mobility    Modified Rankin (Stroke Patients Only)       Balance Overall balance assessment: Needs assistance Sitting-balance support: No upper extremity supported;Feet supported Sitting balance-Leahy Scale: Fair     Standing balance support: Bilateral upper extremity supported;During functional activity Standing balance-Leahy Scale: Poor Standing balance comment: relieson UE suport                             Pertinent Vitals/Pain Pain Assessment: No/denies pain    Home Living Family/patient expects to be discharged to:: Private residence Living Arrangements: Children Available Help at Discharge: Family;Available PRN/intermittently Type of Home: House Home Access: Level entry     Home Layout: One level Home Equipment: Walker - 4 wheels;Bedside commode      Prior Function Level of Independence: Independent with assistive device(s)         Comments: States she has been staying in bed when son isnt there     Hand Dominance   Dominant Hand: Right    Extremity/Trunk Assessment   Upper Extremity Assessment Upper Extremity Assessment: Defer to OT evaluation    Lower Extremity Assessment Lower Extremity Assessment: Generalized weakness    Cervical / Trunk  Assessment Cervical / Trunk Assessment: Normal  Communication   Communication: No difficulties  Cognition Arousal/Alertness: Awake/alert Behavior During Therapy: WFL for tasks assessed/performed Overall Cognitive Status: Within Functional Limits for tasks assessed                                        General Comments       Exercises     Assessment/Plan    PT Assessment Patient needs continued PT services  PT Problem List Decreased activity tolerance;Decreased balance;Decreased mobility;Decreased knowledge of use of DME;Decreased safety awareness;Decreased knowledge of precautions       PT Treatment Interventions DME instruction;Gait training;Functional mobility training;Therapeutic activities;Therapeutic exercise;Balance training;Patient/family education(vestibular rehab)    PT Goals (Current goals can be found in the Care Plan section)  Acute Rehab PT Goals Patient Stated Goal: to go home after SNF PT Goal Formulation: With patient Time For Goal Achievement: 12/26/19 Potential to Achieve Goals: Good    Frequency Min 2X/week   Barriers to discharge Decreased caregiver support      Co-evaluation               AM-PAC PT "6 Clicks" Mobility  Outcome Measure Help needed turning from your back to your side while in a flat bed without using bedrails?: None Help needed moving from lying on your back to sitting on the side of a flat bed without using bedrails?: None Help needed moving to and from a bed to a chair (including a wheelchair)?: A Little Help needed standing up from a chair using your arms (e.g., wheelchair or bedside chair)?: A Little Help needed to walk in hospital room?: A Little Help needed climbing 3-5 steps with a railing? : A Little 6 Click Score: 20    End of Session Equipment Utilized During Treatment: Gait belt Activity Tolerance: Patient limited by fatigue(limited by dizziness) Patient left: in chair;with call bell/phone within reach Nurse Communication: Mobility status PT Visit Diagnosis: Unsteadiness on feet (R26.81);Muscle weakness (generalized) (M62.81);Dizziness and giddiness (R42)    Time: [id number] PT Time Calculation (min) (ACUTE ONLY): 17 min   Charges:   PT Evaluation $PT Eval Moderate Complexity: 1 Mod          Tharun Cappella W,PT Acute Rehabilitation  Services Pager:  [phone]  Office:  [place] 12/12/2019, 4:12 PM

## 2019-12-12 NOTE — Social Work (Signed)
Per notes pt discharged, was provided with substance use cessation education per MD. If pt desires additional substance use education/resources have requested that RN let CSW know.  CSW signing off. Please consult if any additional needs arise.  Alexander Mt, MSW, [place] [facility]

## 2019-12-12 NOTE — NC FL2 (Signed)
[place] LEVEL OF CARE SCREENING TOOL     IDENTIFICATION  Patient Name: [name] Birthdate: 1940-08-19 Sex: female Admission Date (Current Location): 12/11/2019  [facility] and [place] Number:  Herbalist and Address:  [facility] [place]. [facility] [place] [address] [place], [place] [address]      Provider Number: [id number]  Attending Physician Name and Address:  Little Ishikawa, MD  Relative Name and Phone Number:       Current Level of Care: Hospital Recommended Level of Care: [place] Prior Approval Number:    Date Approved/Denied:   PASRR Number: [id number]  Discharge Plan: SNF    Current Diagnoses: Patient Active Problem List   Diagnosis Date Noted  . Closed displaced fracture of right femoral neck ([place]) 11/04/2019  . Hypokalemia 11/04/2019  . Palpitations 11/19/2017  . Atypical chest pain 11/19/2017  . Pleuritic chest pain 10/09/2017  . Hypoxia 10/09/2017  . Appendicitis with perforation   . Acute appendicitis 10/06/2017  . Fall 04/10/2017  . UTI (urinary tract infection) 04/10/2017  . Accelerated hypertension 04/10/2017  . Malignant neoplasm of upper-outer quadrant of right breast in female, estrogen receptor positive ([place]) 01/07/2016  . Stroke ([place]) 06/05/2014  . Alcohol dependence ([place]) 06/05/2014  . Dyslipidemia 06/05/2014  . Syncope 06/04/2014  . Postoperative anemia due to acute blood loss 12/19/2012  . Osteoporosis 12/19/2012  . Osteoporosis with fracture 12/19/2012  . Hip fracture, left ([place]) 12/16/2012  . Gout 12/16/2012  . fracture of fifth right metatarsal bone 12/16/2012  . Alcohol abuse 12/16/2012  . GERD (gastroesophageal reflux disease) 12/16/2012  . PAF (paroxysmal atrial fibrillation) ([place]) 12/16/2012  . B12 deficiency 12/16/2012  . Vitamin D insufficiency 12/16/2012  . Esophageal stricture 12/16/2012  . Other and unspecified hyperlipidemia 12/16/2012  . Reactive depression  (situational) 12/16/2012  . Insomnia 12/16/2012  . H/O: CVA (cerebrovascular accident) 12/16/2012  . Dupuytren's contracture of both hands 12/16/2012  . Fibrocystic breast disease 12/16/2012    Orientation RESPIRATION BLADDER Height & Weight     Self, Time, Situation, Place  Normal Incontinent Weight:   Height:     BEHAVIORAL SYMPTOMS/MOOD NEUROLOGICAL BOWEL NUTRITION STATUS      Continent Diet  AMBULATORY STATUS COMMUNICATION OF NEEDS Skin   Limited Assist Verbally Normal                       Personal Care Assistance Level of Assistance  Bathing, Feeding, Dressing Bathing Assistance: Limited assistance Feeding assistance: Independent Dressing Assistance: Limited assistance     Functional Limitations Info  Sight, Hearing, Speech Sight Info: Adequate Hearing Info: Adequate Speech Info: Adequate    SPECIAL CARE FACTORS FREQUENCY  PT (By licensed PT), OT (By licensed OT)     PT Frequency: 5x week OT Frequency: 5x week            Contractures Contractures Info: Not present    Additional Factors Info  Code Status, Allergies Code Status Info: Full Code Allergies Info: Vicodin (Hydrocodone-acetaminophen), Ambien (Zolpidem Tartrate)           Current Medications (12/12/2019):  This is the current hospital active medication list Current Facility-Administered Medications  Medication Dose Route Frequency Provider Last Rate Last Admin  . acetaminophen (TYLENOL) tablet 650 mg  650 mg Oral Q6H PRN Little Ishikawa, MD   650 mg at 12/12/19 1608  . [START ON 12/13/2019] cefTRIAXone (ROCEPHIN) 1 g in sodium chloride 0.9 % 100 mL  IVPB  1 g Intravenous Q24H Rise Patience, MD      . docusate sodium (COLACE) capsule 100 mg  100 mg Oral BID Rise Patience, MD   100 mg at 12/12/19 0950  . enoxaparin (LOVENOX) injection 40 mg  40 mg Subcutaneous Q24H Rise Patience, MD   40 mg at 12/12/19 1608  . folic acid (FOLVITE) tablet 1 mg  1 mg Oral Daily  Rise Patience, MD   1 mg at 12/12/19 0950  . LORazepam (ATIVAN) tablet 1-4 mg  1-4 mg Oral Q1H PRN Rise Patience, MD       Or  . LORazepam (ATIVAN) injection 1-4 mg  1-4 mg Intravenous Q1H PRN Rise Patience, MD      . metoprolol succinate (TOPROL-XL) 24 hr tablet 25 mg  25 mg Oral Daily Rise Patience, MD   25 mg at 12/12/19 0950  . multivitamin with minerals tablet 1 tablet  1 tablet Oral Daily Rise Patience, MD   1 tablet at 12/12/19 0950  . nitroGLYCERIN (NITROSTAT) SL tablet 0.4 mg  0.4 mg Sublingual Q5 min PRN Rise Patience, MD      . ondansetron [facility]) tablet 4 mg  4 mg Oral Q6H PRN Rise Patience, MD       Or  . ondansetron [facility]) injection 4 mg  4 mg Intravenous Q6H PRN Rise Patience, MD      . sodium chloride flush (NS) 0.9 % injection 3 mL  3 mL Intravenous Once Rise Patience, MD      . thiamine tablet 100 mg  100 mg Oral Daily Rise Patience, MD   100 mg at 12/12/19 [id number]   Or  . thiamine (B-1) injection 100 mg  100 mg Intravenous Daily Rise Patience, MD         Discharge Medications: Please see discharge summary for a list of discharge medications.  Relevant Imaging Results:  Relevant Lab Results:   Additional Information SS# [id number]  Alexander [place], LCSW

## 2019-12-12 NOTE — Evaluation (Signed)
Occupational Therapy Evaluation Patient Details Name: [name] MRN: [id number] DOB: 06/26/1941 Today's Date: 12/12/2019    History of Present Illness [name] is a 79 y.o. female with history of paroxysmal atrial fibrillation alcohol abuse tremors history of breast cancer who had a recent right hip surgery and was discharged to rehab eventually being discharged back home was brought to the ER after patient had two syncopal episode at home.   Clinical Impression   Pt has not been thriving since her discharge from SNF after 8 days of rehab following her hip fx. She reports primarily staying in bed while her son was at work and states she has had poor oral intake. Pt presents with generalized weakness and poor standing balance requiring min to min guard assist for OOB with RW and ADL. Recommending SNF for further rehab, pt and son are in agreement. Will follow acutely.    Follow Up Recommendations  SNF;Supervision/Assistance - 24 hour    Equipment Recommendations  3 in 1 bedside commode;Tub/shower bench    Recommendations for Other Services       Precautions / Restrictions Precautions Precautions: Fall Restrictions Weight Bearing Restrictions: No      Mobility Bed Mobility Overal bed mobility: Modified Independent             General bed mobility comments: assisted her R LE back into bed using her hands  Transfers Overall transfer level: Needs assistance Equipment used: Rolling walker (2 wheeled) Transfers: Sit to/from Stand Sit to Stand: Min guard         General transfer comment: Needed cues for hand placement. Slightly unsteady and needed several attempts to stand from low bed.     Balance Overall balance assessment: Needs assistance Sitting-balance support: No upper extremity supported;Feet supported Sitting balance-Leahy Scale: Good Sitting balance - Comments: no LOB donning socks   Standing balance support: Bilateral upper extremity  supported;During functional activity Standing balance-Leahy Scale: Poor Standing balance comment: reliant on B UE support for dynamic balance                           ADL either performed or assessed with clinical judgement   ADL Overall ADL's : Needs assistance/impaired Eating/Feeding: Independent   Grooming: Wash/dry hands;Standing;Min guard   Upper Body Bathing: Set up;Sitting   Lower Body Bathing: Min guard;Sit to/from stand   Upper Body Dressing : Set up;Sitting   Lower Body Dressing: Min guard;Sit to/from stand Lower Body Dressing Details (indicate cue type and reason): able to don socks Toilet Transfer: Minimal assistance;Ambulation;RW;Regular Toilet;Grab bars   Toileting- Clothing Manipulation and Hygiene: Min guard;Sit to/from stand       Functional mobility during ADLs: Rolling walker;Minimal assistance General ADL Comments: Pt reports she was only able to tolerate sitting in chair for a few minutes after PT due to nausea.     Vision Patient Visual Report: No change from baseline       Perception     Praxis      Pertinent Vitals/Pain Pain Assessment: Faces Faces Pain Scale: Hurts little more Pain Location: R hip Pain Descriptors / Indicators: Sore;Discomfort Pain Intervention(s): Patient requesting pain meds-RN notified;Repositioned     Hand Dominance Right   Extremity/Trunk Assessment Upper Extremity Assessment Upper Extremity Assessment: Overall WFL for tasks assessed   Lower Extremity Assessment Lower Extremity Assessment: Defer to PT evaluation   Cervical / Trunk Assessment Cervical / Trunk Assessment: Normal   Communication Communication Communication:  No difficulties   Cognition Arousal/Alertness: Awake/alert Behavior During Therapy: Flat affect Overall Cognitive Status: Within Functional Limits for tasks assessed                                     General Comments       Exercises     Shoulder  Instructions      Home Living Family/patient expects to be discharged to:: Private residence Living Arrangements: Children(Son ) Available Help at Discharge: Family;Available PRN/intermittently(Son works) Type of Home: House Home Access: Level entry     Home Layout: One level     Bathroom Shower/Tub: Teacher, early years/pre: Standard     Home Equipment: Environmental consultant - 4 wheels   Additional Comments: she may have access to a tub seat      Prior Functioning/Environment Level of Independence: Independent with assistive device(s)        Comments: States she has been staying in bed when son isnt there        OT Problem List: Decreased strength;Decreased activity tolerance;Impaired balance (sitting and/or standing);Decreased knowledge of use of DME or AE;Pain      OT Treatment/Interventions: Self-care/ADL training;DME and/or AE instruction;Therapeutic activities;Patient/family education;Balance training    OT Goals(Current goals can be found in the care plan section) Acute Rehab OT Goals Patient Stated Goal: to go home after SNF OT Goal Formulation: With patient Time For Goal Achievement: 12/26/19 Potential to Achieve Goals: Good ADL Goals Pt Will Perform Grooming: with modified independence;standing Pt Will Perform Lower Body Bathing: with modified independence;sit to/from stand Pt Will Perform Lower Body Dressing: with modified independence;sit to/from stand Pt Will Transfer to Toilet: with modified independence;ambulating;bedside commode(over toilet) Additional ADL Goal #1: Pt will gather items necessary for ADL around her room with RW modified independently.  OT Frequency: Min 2X/week   Barriers to D/C:            Co-evaluation              AM-PAC OT "6 Clicks" Daily Activity     Outcome Measure Help from another person eating meals?: None Help from another person taking care of personal grooming?: A Little Help from another person toileting, which  includes using toliet, bedpan, or urinal?: A Little Help from another person bathing (including washing, rinsing, drying)?: A Little Help from another person to put on and taking off regular upper body clothing?: None Help from another person to put on and taking off regular lower body clothing?: A Little 6 Click Score: 20   End of Session Equipment Utilized During Treatment: Gait belt;Rolling walker Nurse Communication: Mobility status  Activity Tolerance: Patient tolerated treatment well Patient left: in bed;with call bell/phone within reach;with bed alarm set  OT Visit Diagnosis: Unsteadiness on feet (R26.81);Other abnormalities of gait and mobility (R26.89);Pain;Muscle weakness (generalized) (M62.81) Pain - Right/Left: Right Pain - part of body: Hip                Time: 1550-1605 OT Time Calculation (min): 15 min Charges:  OT General Charges $OT Visit: 1 Visit OT Evaluation $OT Eval Moderate Complexity: 1 Mod  Nestor Lewandowsky, OTR/L Acute Rehabilitation Services Pager: [phone] Office: [phone]  Malka So 12/12/2019, 4:21 PM

## 2019-12-12 NOTE — TOC Initial Note (Addendum)
Transition of Care [facility]) - Initial/Assessment Note    Patient Details  Name: [name] MRN: [id number] Date of Birth: 1940-09-29  Transition of [facility]) CM/SW Contact:    Marilu Favre, RN Phone Number: 12/12/2019, 4:38 PM  Clinical Narrative:                  Patient from home with son and daughter in law.   Patient recently at [facility] for rehab and discharged home. Per patient [place] tried to arrange home health and DME but son refused. Patient shares her daughter in laws walker and shower chair.   Patient wants to return to [facility]. TOC team will fax FL2 and start insurance authorization.  Patient drinks 3 beers a day and does not want resources. States she can stop on her own   [name] at [place] can accept patient back, however if insurance approves patient will be in co pay days and will have to pay around $160.00 a day,, unless patient goes on her Medicaid and agreeable to signing over her cheque. Explained this to patient and patient voiced understanding. Expected Discharge Plan: Skilled Nursing Facility Barriers to Discharge: Insurance Authorization   Patient Goals and CMS Choice Patient states their goals for this hospitalization and ongoing recovery are:: to go to SNF for short term rehab CMS Medicare.gov Compare Post Acute Care list provided to:: Patient Choice offered to / list presented to : Patient  Expected Discharge Plan and Services Expected Discharge Plan: [place]   Discharge Planning Services: CM Consult Post Acute Care Choice: [place] arrangements for the past 2 months: Single Family Home Expected Discharge Date: 12/12/19               DME Arranged: N/A DME Agency: NA       HH Arranged: NA          Prior Living Arrangements/Services Living arrangements for the past 2 months: Single Family Home Lives with:: Adult Children Patient language and need for interpreter reviewed::  Yes Do you feel safe going back to the place where you live?: Yes      Need for Family Participation in Patient Care: Yes (Comment) Care giver support system in place?: No (comment)   Criminal Activity/Legal Involvement Pertinent to Current Situation/Hospitalization: No - Comment as needed  Activities of Daily Living      Permission Sought/Granted   Permission granted to share information with : No              Emotional Assessment Appearance:: Appears stated age Attitude/Demeanor/Rapport: Engaged Affect (typically observed): Accepting Orientation: : Oriented to Self, Oriented to Place, Oriented to  Time, Oriented to Situation Alcohol / Substance Use: Not Applicable Psych Involvement: No (comment)  Admission diagnosis:  Hypokalemia [E87.6] Syncope [R55] Syncope, unspecified syncope type [R55] Patient Active Problem List   Diagnosis Date Noted  . Closed displaced fracture of right femoral neck ([place]) 11/04/2019  . Hypokalemia 11/04/2019  . Palpitations 11/19/2017  . Atypical chest pain 11/19/2017  . Pleuritic chest pain 10/09/2017  . Hypoxia 10/09/2017  . Appendicitis with perforation   . Acute appendicitis 10/06/2017  . Fall 04/10/2017  . UTI (urinary tract infection) 04/10/2017  . Accelerated hypertension 04/10/2017  . Malignant neoplasm of upper-outer quadrant of right breast in female, estrogen receptor positive ([place]) 01/07/2016  . Stroke ([place]) 06/05/2014  . Alcohol dependence ([place]) 06/05/2014  . Dyslipidemia 06/05/2014  . Syncope 06/04/2014  . Postoperative anemia due  to acute blood loss 12/19/2012  . Osteoporosis 12/19/2012  . Osteoporosis with fracture 12/19/2012  . Hip fracture, left ([place]) 12/16/2012  . Gout 12/16/2012  . fracture of fifth right metatarsal bone 12/16/2012  . Alcohol abuse 12/16/2012  . GERD (gastroesophageal reflux disease) 12/16/2012  . PAF (paroxysmal atrial fibrillation) ([place]) 12/16/2012  . B12 deficiency 12/16/2012  . Vitamin D  insufficiency 12/16/2012  . Esophageal stricture 12/16/2012  . Other and unspecified hyperlipidemia 12/16/2012  . Reactive depression (situational) 12/16/2012  . Insomnia 12/16/2012  . H/O: CVA (cerebrovascular accident) 12/16/2012  . Dupuytren's contracture of both hands 12/16/2012  . Fibrocystic breast disease 12/16/2012   PCP:  Josetta Huddle, MD Pharmacy:   [facility] [phone] [address] [place], [place] - [place] [address] [place] [address] Phone: [phone] Fax: [place], [place] - [place] [facility] [place] [address] Phone: [phone] Fax: [phone]     Social Determinants of Health (SDOH) Interventions    Readmission Risk Interventions No flowsheet data found.

## 2019-12-12 NOTE — ED Notes (Signed)
Pt given breakfast tray

## 2019-12-12 NOTE — H&P (Signed)
History and Physical    Kimyada Paul [id number] DOB: Mar 21, 1941 DOA: 12/11/2019  PCP: Josetta Huddle, MD  Patient coming from: Home.  Chief Complaint: Loss of consciousness.  HPI: [name] is a 79 y.o. female with history of paroxysmal atrial fibrillation alcohol abuse tremors history of breast cancer who had a recent right hip surgery and was discharged to rehab eventually being discharged back home was brought to the ER after patient had two syncopal episode at home.  Patient states the first episode happened when patient was walking to the bathroom and coming back at around 4:30 AM.  She felt slightly diaphoretic and passed out.  Did not hit her head at that time.  The second episode happened later and at that time which patient states she may have hit her head.  Denies any incontinence of urine or bowel.  Admits to drinking alcohol usually 2-3 beers a day.  Denies any chest pain shortness breath nausea vomiting or diarrhea.  Uses a walker to walk after the surgery.  ED Course: In the ER patient was not orthostatic blood pressure mildly elevated.  EKG shows normal sinus rhythm with RBBB.  Labs show potassium of 3.4 high sensitive troponin was negative D-dimer was mildly elevated CT angiogram of the chest was negative for pulmonary embolism CT head is unremarkable.  CBC unremarkable.  Covid test was negative.  Review of Systems: As per HPI, rest all negative.   Past Medical History:  Diagnosis Date  . Alcohol abuse   . Anemia   . Arthritis   . Bacterial vaginosis   . Cancer ([place]) 01/2015   Breast cancer right   . Cerebrovascular accident (stroke) ([place]) 2/07   left frontal lobe  . Closed displaced fracture of right femoral neck ([place]) 11/04/2019  . Closed left hip fracture ([place]) 5/14  . Complication of anesthesia    woke up too early  with kidney stone surgery and wrist surgery  . Depression   . Dupuytren's contracture   . Fibrocystic breast disease   . Fracture of  metatarsal bone of right foot    5th metatarsal  . GERD (gastroesophageal reflux disease)   . Gout   . Headache   . History of blood transfusion   . Hyperlipidemia LDL goal <70   . Hypertension   . Paroxysmal A-fib ([place])   . Renal calculus   . Vitamin B12 deficiency   . Vitamin D deficiency     Past Surgical History:  Procedure Laterality Date  . ABDOMINAL HYSTERECTOMY    . cataracts Bilateral   . COLONOSCOPY    . ESOPHAGEAL MANOMETRY N/A 08/15/2017   Procedure: ESOPHAGEAL MANOMETRY (EM);  Surgeon: Ronnette Juniper, MD;  Location: [facility];  Service: Gastroenterology;  Laterality: N/A;  . ESOPHAGOGASTRODUODENOSCOPY (EGD) WITH PROPOFOL N/A 05/02/2016   Procedure: ESOPHAGOGASTRODUODENOSCOPY (EGD) WITH PROPOFOL;  Surgeon: Garlan Fair, MD;  Location: [facility];  Service: Endoscopy;  Laterality: N/A;  . HIP ARTHROPLASTY Left 12/17/2012   Procedure: LEFT HIP HEMI ARTHROPLASTY ;  Surgeon: Rozanna Box, MD;  Location: [place] [facility];  Service: Orthopedics;  Laterality: Left;  . HIP ARTHROPLASTY Right 11/04/2019   Procedure: ARTHROPLASTY BIPOLAR HIP (HEMIARTHROPLASTY);  Surgeon: Altamese Dennison, MD;  Location: [place];  Service: Orthopedics;  Laterality: Right;  . LAPAROSCOPIC APPENDECTOMY N/A 10/05/2017   Procedure: APPENDECTOMY LAPAROSCOPIC;  Surgeon: Greer Pickerel, MD;  Location: [facility];  Service: General;  Laterality: N/A;  . left wrist surgery surgery  7 yrs ago  .  LEG SURGERY Left   . LITHOTRIPSY    . MASTECTOMY W/ SENTINEL NODE BIOPSY Bilateral 01/20/2016   Procedure: BILATERAL TOTAL MASTECTOMY WITH RIGHT SENTINEL LYMPH NODE BIOPSY;  Surgeon: Rolm Bookbinder, MD;  Location: [facility];  Service: General;  Laterality: Bilateral;  . OPEN REDUCTION INTERNAL FIXATION (ORIF) DISTAL RADIAL FRACTURE Right 11/01/2018   Procedure: OPEN REDUCTION INTERNAL FIXATION (ORIF)  RIGHT DISTAL RADIAL FRACTURE;  Surgeon: Altamese Buffalo Soapstone, MD;  Location: [facility];  Service: Orthopedics;  Laterality: Right;     reports  that she has never smoked. Her smokeless tobacco use includes chew. She reports current alcohol use of about 21.0 standard drinks of alcohol per week. She reports that she does not use drugs.  Allergies  Allergen Reactions  . Vicodin [Hydrocodone-Acetaminophen]     UNSPECIFIED REACTION   . Ambien [Zolpidem Tartrate] Other (See Comments)    Causes nightmares    Family History  Problem Relation Age of Onset  . Stroke Mother   . Hypertension Father   . CAD Father     Prior to Admission medications   Medication Sig Start Date End Date Taking? Authorizing Provider  ascorbic acid (VITAMIN C) 500 MG tablet Take 1 tablet (500 mg total) by mouth daily. 11/07/19 01/06/20 Yes Ainsley Spinner, PA-C  cholecalciferol (VITAMIN D) 25 MCG tablet Take 2 tablets (2,000 Units total) by mouth 2 (two) times daily. 11/07/19 02/05/20 Yes Ainsley Spinner, PA-C  Cyanocobalamin (VITAMIN B-12 IJ) Inject 1 application as directed every 30 (thirty) days.   Yes [provider]  docusate sodium (COLACE) 100 MG capsule Take 1 capsule (100 mg total) by mouth 2 (two) times daily. 11/09/19  Yes Donne Hazel, MD  ipratropium-albuterol (DUONEB) 0.5-2.5 (3) MG/3ML SOLN Take 3 mLs by nebulization every 4 (four) hours as needed. Patient taking differently: Take 3 mLs by nebulization every 4 (four) hours as needed (shortness of breath.).  11/09/19  Yes Donne Hazel, MD  metoprolol succinate (TOPROL-XL) 25 MG 24 hr tablet Take 25 mg by mouth daily.   Yes [provider]  Multiple Vitamin (MULTIVITAMIN WITH MINERALS) TABS tablet Take 1 tablet by mouth daily. 11/07/19 05/05/20 Yes Ainsley Spinner, PA-C  nitroGLYCERIN (NITROSTAT) 0.4 MG SL tablet Place 1 tablet under the tongue every 5 (five) minutes as needed for chest pain.  11/13/17  Yes [provider]  vitamin B-12 (CYANOCOBALAMIN) 1000 MCG tablet Take 1,000 mcg by mouth daily.   Yes [provider]  acetaminophen (TYLENOL) 325 MG tablet Take 325 mg by mouth  every 6 (six) hours as needed for moderate pain.    [provider]  enoxaparin (LOVENOX) 40 MG/0.4ML injection Inject 0.4 mLs (40 mg total) into the skin daily for 21 days. 11/07/19 11/28/19  Ainsley Spinner, PA-C  oxyCODONE (OXY IR/ROXICODONE) 5 MG immediate release tablet Take 1 tablet (5 mg total) by mouth every 6 (six) hours as needed for moderate pain or severe pain. Patient not taking: Reported on 12/11/2019 11/07/19   Ainsley Spinner, PA-C  traMADol (ULTRAM) 50 MG tablet Take 1 tablet (50 mg total) by mouth every 8 (eight) hours as needed (mild or moderate pain). Patient not taking: Reported on 12/11/2019 11/07/19   Ainsley Spinner, PA-C    Physical Exam: Constitutional: Moderately built and nourished. Vitals:   12/12/19 0045 12/12/19 0100 12/12/19 0115 12/12/19 0130  BP:    (!) 184/88  Pulse:  71 72 67  Resp: 18 (!) 22 20 18   Temp:  TempSrc:      SpO2:  98% 95% 96%   Eyes: Anicteric no pallor. ENMT: No discharge from the ears eyes nose or mouth. Neck: No mass felt.  No neck rigidity. Respiratory: No rhonchi or crepitations. Cardiovascular: S1-S2 heard. Abdomen: Soft nontender bowel sounds present. Musculoskeletal: No edema. Skin: No rash. Neurologic: Alert awake oriented to time place and person.  Moves all extremities 5 x 5. Psychiatric: Appears normal.  Normal affect.   Labs on Admission: I have personally reviewed following labs and imaging studies  CBC: Recent Labs  Lab 12/11/19 1640  WBC 8.9  HGB 14.2  HCT 44.4  MCV 96.1  PLT 99991111   Basic Metabolic Panel: Recent Labs  Lab 12/11/19 1640 12/11/19 2118  NA 139  --   K 3.4*  --   CL 102  --   CO2 23  --   GLUCOSE 128*  --   BUN 7*  --   CREATININE 0.66  --   CALCIUM 8.8*  --   MG  --  1.7   GFR: CrCl cannot be calculated (Unknown ideal weight.). Liver Function Tests: Recent Labs  Lab 12/11/19 2118  AST 16  ALT 13  ALKPHOS 142*  BILITOT 0.5  PROT 6.8  ALBUMIN 3.6   No results for input(s):  LIPASE, AMYLASE in the last 168 hours. No results for input(s): AMMONIA in the last 168 hours. Coagulation Profile: No results for input(s): INR, PROTIME in the last 168 hours. Cardiac Enzymes: No results for input(s): CKTOTAL, CKMB, CKMBINDEX, TROPONINI in the last 168 hours. BNP (last 3 results) No results for input(s): PROBNP in the last 8760 hours. HbA1C: No results for input(s): HGBA1C in the last 72 hours. CBG: Recent Labs  Lab 12/11/19 1653  GLUCAP 126*   Lipid Profile: No results for input(s): CHOL, HDL, LDLCALC, TRIG, CHOLHDL, LDLDIRECT in the last 72 hours. Thyroid Function Tests: No results for input(s): TSH, T4TOTAL, FREET4, T3FREE, THYROIDAB in the last 72 hours. Anemia Panel: No results for input(s): VITAMINB12, FOLATE, FERRITIN, TIBC, IRON, RETICCTPCT in the last 72 hours. Urine analysis:    Component Value Date/Time   COLORURINE YELLOW 12/11/2019 2231   APPEARANCEUR CLEAR 12/11/2019 2231   LABSPEC 1.008 12/11/2019 2231   PHURINE 5.0 12/11/2019 2231   GLUCOSEU NEGATIVE 12/11/2019 2231   HGBUR NEGATIVE 12/11/2019 2231   BILIRUBINUR NEGATIVE 12/11/2019 2231   KETONESUR NEGATIVE 12/11/2019 2231   PROTEINUR NEGATIVE 12/11/2019 2231   UROBILINOGEN 0.2 09/11/2014 2309   NITRITE NEGATIVE 12/11/2019 2231   LEUKOCYTESUR SMALL (A) 12/11/2019 2231   Sepsis Labs: @LABRCNTIP (procalcitonin:4,lacticidven:4) )No results found for this or any previous visit (from the past 240 hour(s)).   Radiological Exams on Admission: CT Head Wo Contrast  Result Date: 12/11/2019 CLINICAL DATA:  Syncope EXAM: CT HEAD WITHOUT CONTRAST TECHNIQUE: Contiguous axial images were obtained from the base of the skull through the vertex without intravenous contrast. COMPARISON:  CT brain 11/04/2019 FINDINGS: Brain: No acute territorial infarction, hemorrhage or intracranial mass. Atrophy and mild chronic small vessel ischemic change of the white matter. Chronic lacunar infarct within the left white  matter. Stable ventricle size. Vascular: No hyperdense vessels.  Carotid vascular calcification Skull: Normal. Negative for fracture or focal lesion. Sinuses/Orbits: No acute finding. Other: None IMPRESSION: 1. No CT evidence for acute intracranial abnormality. 2. Atrophy and mild chronic small vessel ischemic change of the white matter Electronically Signed   By: Donavan Foil M.D.   On: 12/11/2019 20:59   CT Angio  Chest PE W and/or Wo Contrast  Result Date: 12/12/2019 CLINICAL DATA:  Syncope EXAM: CT ANGIOGRAPHY CHEST WITH CONTRAST TECHNIQUE: Multidetector CT imaging of the chest was performed using the standard protocol during bolus administration of intravenous contrast. Multiplanar CT image reconstructions and MIPs were obtained to evaluate the vascular anatomy. CONTRAST:  154mL OMNIPAQUE IOHEXOL 350 MG/ML SOLN COMPARISON:  11/06/2019 CTA chest FINDINGS: Cardiovascular: Contrast injection is sufficient to demonstrate satisfactory opacification of the pulmonary arteries to the segmental level. There is no pulmonary embolus or evidence of right heart strain. The size of the main pulmonary artery is normal. Heart size is normal, with no pericardial effusion. The course and caliber of the aorta are normal. There is mild atherosclerotic calcification. Opacification decreased due to pulmonary arterial phase contrast bolus timing. Mediastinum/Nodes: No mediastinal, hilar or axillary lymphadenopathy. Normal visualized thyroid. Thoracic esophageal course is normal. Lungs/Pleura: Airways are patent. No pleural effusion, lobar consolidation, pneumothorax or pulmonary infarction. Upper Abdomen: Contrast bolus timing is not optimized for evaluation of the abdominal organs. The visualized portions of the organs of the upper abdomen are normal. Musculoskeletal: No chest wall abnormality. No bony spinal canal stenosis. Review of the MIP images confirms the above findings. IMPRESSION: No pulmonary embolus or other acute  thoracic abnormality. Aortic Atherosclerosis (ICD10-I70.0). Electronically Signed   By: Ulyses Jarred M.D.   On: 12/12/2019 02:06    EKG: Independently reviewed.  Normal sinus rhythm RBBB.  Assessment/Plan Principal Problem:   Syncope Active Problems:   Alcohol abuse   PAF (paroxysmal atrial fibrillation) (HCC)   H/O: CVA (cerebrovascular accident)   Malignant neoplasm of upper-outer quadrant of right breast in female, estrogen receptor positive ([place])    1. Syncope cause not clear.  Patient has had recent admission for right hip fracture during which patient also had a syncopal work-up for which patient had 2D echo which showed EF of 55 to 60%.  Closely monitoring telemetry.  Patient was not orthostatic. 2. History of paroxysmal atrial fibrillation not on anticoagulation likely from risk of falls.  On metoprolol. 3. Alcohol abuse advised about quitting on CIWA. 4. UTI on ceftriaxone.  During recent admission patient also had Enterococcus UTI.  Follow urine cultures. 5. History of CVA.   DVT prophylaxis: Lovenox. Code Status: Full code. Family Communication: Discussed with patient. Disposition Plan: Home. Consults called: None. Admission status: Observation.   Rise Patience MD [facility] Pager [phone].  If 7PM-7AM, please contact night-coverage [email or web address] Password [email or web address]  12/12/2019, 4:05 AM

## 2019-12-13 DIAGNOSIS — F1010 Alcohol abuse, uncomplicated: Secondary | ICD-10-CM | POA: Diagnosis not present

## 2019-12-13 DIAGNOSIS — C50411 Malignant neoplasm of upper-outer quadrant of right female breast: Secondary | ICD-10-CM

## 2019-12-13 DIAGNOSIS — Z170 Estrogen receptor positive status [ER+]: Secondary | ICD-10-CM

## 2019-12-13 DIAGNOSIS — R55 Syncope and collapse: Secondary | ICD-10-CM | POA: Diagnosis not present

## 2019-12-13 DIAGNOSIS — Z8673 Personal history of transient ischemic attack (TIA), and cerebral infarction without residual deficits: Secondary | ICD-10-CM | POA: Diagnosis not present

## 2019-12-13 NOTE — TOC Progression Note (Signed)
Transition of Care [facility]) - Progression Note    Patient Details  Name: [name] MRN: [id number] Date of Birth: [birth year]  Transition of Care [facility]) CM/SW Contact  Bartholomew Crews, RN Phone Number: [phone] 12/13/2019, 8:42 AM  Clinical Narrative:     Spoke with representative at [facility] to verify authorization in progress - review in progress. Current TOC contact information provided (SW and CM). TOC team following for transition needs.   Expected Discharge Plan: Skilled Nursing Facility Barriers to Discharge: Insurance Authorization  Expected Discharge Plan and Services Expected Discharge Plan: [place]   Discharge Planning Services: CM Consult Post Acute Care Choice: [place] arrangements for the past 2 months: Single Family Home Expected Discharge Date: 12/12/19               DME Arranged: N/A DME Agency: NA       HH Arranged: NA           Social Determinants of Health (SDOH) Interventions    Readmission Risk Interventions No flowsheet data found.

## 2019-12-13 NOTE — Progress Notes (Signed)
Physician Discharge Summary  [name] [id number] DOB: [birth year] DOA: 12/11/2019  PCP: Josetta Huddle, MD  Admit date: 12/11/2019 Discharge date: 12/13/2019  Admitted From: Home Disposition: Home  Recommendations for Outpatient Follow-up:  1. Follow up with PCP in 1-2 weeks 2. Please obtain BMP/CBC in one week  Discharge Condition: Stable CODE STATUS: Full Diet recommendation: As tolerated  Brief/Interim Summary: [name] is a 79 y.o. female with history of paroxysmal atrial fibrillation alcohol abuse tremors history of breast cancer who had a recent right hip surgery and was discharged to rehab eventually being discharged back home was brought to the ER after patient had two syncopal episode at home.  Patient states the first episode happened when patient was walking to the bathroom and coming back at around 4:30 AM.  She felt slightly diaphoretic and passed out.  Did not hit her head at that time.  The second episode happened later and at that time which patient states she may have hit her head.  Denies any incontinence of urine or bowel.  Admits to drinking alcohol usually 2-3 beers a day. Denies any chest pain shortness breath nausea vomiting or diarrhea.  Uses a walker to walk after the surgery. In the ER patient was not orthostatic blood pressure mildly elevated.  EKG shows normal sinus rhythm with RBBB.  Labs show potassium of 3.4 high sensitive troponin was negative D-dimer was mildly elevated CT angiogram of the chest was negative for pulmonary embolism CT head is unremarkable.  CBC unremarkable.  Covid test was negative  Patient admitted as above after multiple episodes of syncope at home, appear to be after standing to walk consistent with orthostatic syncope. Patient admits to very poor p.o. intake since being discharged from rehab previously after recent hip fracture and repair.  We discussed the need for ongoing adequate p.o. intake especially liquids and that she  should stop drinking alcohol which only increases her fall risk which is more elevated than usual given her recent surgery.  She continues to use a walker as discussed. Patient's imaging otherwise is unremarkable, CT angio of the chest unremarkable; CT head no acute findings, labs remain without abnormality other than elevated hemoglobin likely consistent with dehydration given hemoconcentration.  At admission patient was initiated on antibiotics for presumed UTI but patient refutes any symptoms and UA is unremarkable -as such antibiotics were discontinued. Patient evaluated by physical therapy late yesterday, patient currently requesting evaluation for SNF - it is apparent that she left her last SNF stay early due to concerns over payment as "they wanted her check" - PT agrees recommends SNF placement due to ongoing balance issues and weakness.  Case management aware, already working on disposition and approval.  Hopefully patient can discharge to SNF in the next 12 to 24 hours.   Patient COVID-19 swab is negative.   Discharge Diagnoses:  Principal Problem:   Syncope Active Problems:   Alcohol abuse   PAF (paroxysmal atrial fibrillation) (HCC)   H/O: CVA (cerebrovascular accident)   Malignant neoplasm of upper-outer quadrant of right breast in female, estrogen receptor positive [facility])    Discharge Instructions  Discharge Instructions    Call MD for:  difficulty breathing, headache or visual disturbances   Complete by: As directed    Call MD for:  extreme fatigue   Complete by: As directed    Call MD for:  hives   Complete by: As directed    Call MD for:  persistant dizziness or light-headedness  Complete by: As directed    Call MD for:  persistant nausea and vomiting   Complete by: As directed    Call MD for:  severe uncontrolled pain   Complete by: As directed    Call MD for:  temperature >100.4   Complete by: As directed    Diet - low sodium heart healthy   Complete by: As  directed    Increase activity slowly   Complete by: As directed      Allergies as of 12/13/2019      Reactions   Vicodin [hydrocodone-acetaminophen]    UNSPECIFIED REACTION    Ambien [zolpidem Tartrate] Other (See Comments)   Causes nightmares      Medication List    STOP taking these medications   enoxaparin 40 MG/0.4ML injection Commonly known as: LOVENOX   oxyCODONE 5 MG immediate release tablet Commonly known as: Oxy IR/ROXICODONE   traMADol 50 MG tablet Commonly known as: ULTRAM     TAKE these medications   acetaminophen 325 MG tablet Commonly known as: TYLENOL Take 325 mg by mouth every 6 (six) hours as needed for moderate pain.   ascorbic acid 500 MG tablet Commonly known as: VITAMIN C Take 1 tablet (500 mg total) by mouth daily.   docusate sodium 100 MG capsule Commonly known as: COLACE Take 1 capsule (100 mg total) by mouth 2 (two) times daily.   ipratropium-albuterol 0.5-2.5 (3) MG/3ML Soln Commonly known as: DUONEB Take 3 mLs by nebulization every 4 (four) hours as needed. What changed: reasons to take this   metoprolol succinate 25 MG 24 hr tablet Commonly known as: TOPROL-XL Take 25 mg by mouth daily.   multivitamin with minerals Tabs tablet Take 1 tablet by mouth daily.   nitroGLYCERIN 0.4 MG SL tablet Commonly known as: NITROSTAT Place 1 tablet under the tongue every 5 (five) minutes as needed for chest pain.   VITAMIN 0000000 IJ Inject 1 application as directed every 30 (thirty) days.   vitamin B-12 1000 MCG tablet Commonly known as: CYANOCOBALAMIN Take 1,000 mcg by mouth daily.   Vitamin D3 25 MCG tablet Commonly known as: Vitamin D Take 2 tablets (2,000 Units total) by mouth 2 (two) times daily.      Contact information for after-discharge care    Destination    [facility] .   Service: Skilled Nursing Contact information: [address] [address] [phone]              Allergies  Allergen Reactions  . Vicodin [Hydrocodone-Acetaminophen]     UNSPECIFIED REACTION   . Ambien [Zolpidem Tartrate] Other (See Comments)    Causes nightmares    Procedures/Studies: CT Head Wo Contrast  Result Date: 12/11/2019 CLINICAL DATA:  Syncope EXAM: CT HEAD WITHOUT CONTRAST TECHNIQUE: Contiguous axial images were obtained from the base of the skull through the vertex without intravenous contrast. COMPARISON:  CT brain 11/04/2019 FINDINGS: Brain: No acute territorial infarction, hemorrhage or intracranial mass. Atrophy and mild chronic small vessel ischemic change of the white matter. Chronic lacunar infarct within the left white matter. Stable ventricle size. Vascular: No hyperdense vessels.  Carotid vascular calcification Skull: Normal. Negative for fracture or focal lesion. Sinuses/Orbits: No acute finding. Other: None IMPRESSION: 1. No CT evidence for acute intracranial abnormality. 2. Atrophy and mild chronic small vessel ischemic change of the white matter Electronically Signed   By: Donavan Foil M.D.   On: 12/11/2019 20:59   CT Angio Chest  PE W and/or Wo Contrast  Result Date: 12/12/2019 CLINICAL DATA:  Syncope EXAM: CT ANGIOGRAPHY CHEST WITH CONTRAST TECHNIQUE: Multidetector CT imaging of the chest was performed using the standard protocol during bolus administration of intravenous contrast. Multiplanar CT image reconstructions and MIPs were obtained to evaluate the vascular anatomy. CONTRAST:  1107mL OMNIPAQUE IOHEXOL 350 MG/ML SOLN COMPARISON:  11/06/2019 CTA chest FINDINGS: Cardiovascular: Contrast injection is sufficient to demonstrate satisfactory opacification of the pulmonary arteries to the segmental level. There is no pulmonary embolus or evidence of right heart strain. The size of the main pulmonary artery is normal. Heart size is normal, with no pericardial effusion. The course and caliber of the aorta are normal. There is mild atherosclerotic calcification.  Opacification decreased due to pulmonary arterial phase contrast bolus timing. Mediastinum/Nodes: No mediastinal, hilar or axillary lymphadenopathy. Normal visualized thyroid. Thoracic esophageal course is normal. Lungs/Pleura: Airways are patent. No pleural effusion, lobar consolidation, pneumothorax or pulmonary infarction. Upper Abdomen: Contrast bolus timing is not optimized for evaluation of the abdominal organs. The visualized portions of the organs of the upper abdomen are normal. Musculoskeletal: No chest wall abnormality. No bony spinal canal stenosis. Review of the MIP images confirms the above findings. IMPRESSION: No pulmonary embolus or other acute thoracic abnormality. Aortic Atherosclerosis (ICD10-I70.0). Electronically Signed   By: Ulyses Jarred M.D.   On: 12/12/2019 02:06    Subjective: No acute issues or events since admission, denies any further episodes of syncope presyncope or lightheadedness with exertion or position changes.  Denies headache, fever, chills, nausea, vomiting, diarrhea, constipation.   Discharge Exam: Vitals:   12/12/19 2326 12/13/19 0618  BP: (!) 138/95 (!) 161/92  Pulse: 63 65  Resp: 17 16  Temp: 98.4 F (36.9 C) 97.9 F (36.6 C)  SpO2: 98% 98%   Vitals:   12/12/19 1718 12/12/19 1816 12/12/19 2326 12/13/19 0618  BP:  (!) 162/86 (!) 138/95 (!) 161/92  Pulse:  64 63 65  Resp:  18 17 16   Temp:  98.3 F (36.8 C) 98.4 F (36.9 C) 97.9 F (36.6 C)  TempSrc:  Oral Oral Oral  SpO2:  94% 98% 98%  Weight: 58.1 kg     Height: 5\' 10"  (1.778 m)       General:  Pleasantly resting in bed, No acute distress. HEENT:  Normocephalic atraumatic.  Sclerae nonicteric, noninjected.  Extraocular movements intact bilaterally. Neck:  Without mass or deformity.  Trachea is midline. Lungs:  Clear to auscultate bilaterally without rhonchi, wheeze, or rales. Heart:  Regular rate and rhythm.  Without murmurs, rubs, or gallops. Abdomen:  Soft, nontender, nondistended.   Without guarding or rebound. Extremities: Without cyanosis, clubbing, edema, or obvious deformity. Vascular:  Dorsalis pedis and posterior tibial pulses palpable bilaterally. Skin:  Warm and dry, no erythema, no ulcerations.   The results of significant diagnostics from this hospitalization (including imaging, microbiology, ancillary and laboratory) are listed below for reference.     Microbiology: Recent Results (from the past 240 hour(s))  Respiratory Panel by RT PCR (Flu A&B, Covid) - Nasopharyngeal Swab     Status: None   Collection Time: 12/12/19  2:56 AM   Specimen: Nasopharyngeal Swab  Result Value Ref Range Status   SARS Coronavirus 2 by RT PCR NEGATIVE NEGATIVE Final    Comment: (NOTE) SARS-CoV-2 target nucleic acids are NOT DETECTED. The SARS-CoV-2 RNA is generally detectable in upper respiratoy specimens during the acute phase of infection. The lowest concentration of SARS-CoV-2 viral copies this assay can detect  is 131 copies/mL. A negative result does not preclude SARS-Cov-2 infection and should not be used as the sole basis for treatment or other patient management decisions. A negative result may occur with  improper specimen collection/handling, submission of specimen other than nasopharyngeal swab, presence of viral mutation(s) within the areas targeted by this assay, and inadequate number of viral copies (<131 copies/mL). A negative result must be combined with clinical observations, patient history, and epidemiological information. The expected result is Negative. Fact Sheet for Patients:  PinkCheek.be Fact Sheet for Healthcare Providers:  GravelBags.it This test is not yet ap proved or cleared by the [place] FDA and  has been authorized for detection and/or diagnosis of SARS-CoV-2 by FDA under an Emergency Use Authorization (EUA). This EUA will remain  in effect (meaning this test can be used) for  the duration of the COVID-19 declaration under Section 564(b)(1) of the Act, 21 U.S.C. section 360bbb-3(b)(1), unless the authorization is terminated or revoked sooner.    Influenza A by PCR NEGATIVE NEGATIVE Final   Influenza B by PCR NEGATIVE NEGATIVE Final    Comment: (NOTE) The Xpert Xpress SARS-CoV-2/FLU/RSV assay is intended as an aid in  the diagnosis of influenza from Nasopharyngeal swab specimens and  should not be used as a sole basis for treatment. Nasal washings and  aspirates are unacceptable for Xpert Xpress SARS-CoV-2/FLU/RSV  testing. Fact Sheet for Patients: PinkCheek.be Fact Sheet for Healthcare Providers: GravelBags.it This test is not yet approved or cleared by the [place] FDA and  has been authorized for detection and/or diagnosis of SARS-CoV-2 by  FDA under an Emergency Use Authorization (EUA). This EUA will remain  in effect (meaning this test can be used) for the duration of the  Covid-19 declaration under Section 564(b)(1) of the Act, 21  U.S.C. section 360bbb-3(b)(1), unless the authorization is  terminated or revoked. Performed at [facility], [place] [address] [place], [place] [address]      Labs: BNP (last 3 results) Recent Labs    11/06/19 1830  BNP 123456*   Basic Metabolic Panel: Recent Labs  Lab 12/11/19 1640 12/11/19 2118 12/12/19 0542  NA 139  --   --   K 3.4*  --   --   CL 102  --   --   CO2 23  --   --   GLUCOSE 128*  --   --   BUN 7*  --   --   CREATININE 0.66  --  0.58  CALCIUM 8.8*  --   --   MG  --  1.7  --    Liver Function Tests: Recent Labs  Lab 12/11/19 2118  AST 16  ALT 13  ALKPHOS 142*  BILITOT 0.5  PROT 6.8  ALBUMIN 3.6   No results for input(s): LIPASE, AMYLASE in the last 168 hours. No results for input(s): AMMONIA in the last 168 hours. CBC: Recent Labs  Lab 12/11/19 1640 12/12/19 0542  WBC 8.9 6.1  HGB 14.2 16.3*  HCT 44.4 51.1*   MCV 96.1 98.5  PLT 218 235   Cardiac Enzymes: No results for input(s): CKTOTAL, CKMB, CKMBINDEX, TROPONINI in the last 168 hours. BNP: Invalid input(s): POCBNP CBG: Recent Labs  Lab 12/11/19 1653  GLUCAP 126*   D-Dimer Recent Labs    12/12/19 0134  DDIMER 1.41*   Hgb A1c No results for input(s): HGBA1C in the last 72 hours. Lipid Profile No results for input(s): CHOL, HDL, LDLCALC, TRIG, CHOLHDL, LDLDIRECT in  the last 72 hours. Thyroid function studies No results for input(s): TSH, T4TOTAL, T3FREE, THYROIDAB in the last 72 hours.  Invalid input(s): FREET3 Anemia work up No results for input(s): VITAMINB12, FOLATE, FERRITIN, TIBC, IRON, RETICCTPCT in the last 72 hours. Urinalysis    Component Value Date/Time   COLORURINE YELLOW 12/11/2019 2231   APPEARANCEUR CLEAR 12/11/2019 2231   LABSPEC 1.008 12/11/2019 2231   PHURINE 5.0 12/11/2019 2231   GLUCOSEU NEGATIVE 12/11/2019 2231   HGBUR NEGATIVE 12/11/2019 2231   BILIRUBINUR NEGATIVE 12/11/2019 2231   KETONESUR NEGATIVE 12/11/2019 2231   PROTEINUR NEGATIVE 12/11/2019 2231   UROBILINOGEN 0.2 09/11/2014 2309   NITRITE NEGATIVE 12/11/2019 2231   LEUKOCYTESUR SMALL (A) 12/11/2019 2231   Sepsis Labs Invalid input(s): PROCALCITONIN,  WBC,  LACTICIDVEN Microbiology Recent Results (from the past 240 hour(s))  Respiratory Panel by RT PCR (Flu A&B, Covid) - Nasopharyngeal Swab     Status: None   Collection Time: 12/12/19  2:56 AM   Specimen: Nasopharyngeal Swab  Result Value Ref Range Status   SARS Coronavirus 2 by RT PCR NEGATIVE NEGATIVE Final    Comment: (NOTE) SARS-CoV-2 target nucleic acids are NOT DETECTED. The SARS-CoV-2 RNA is generally detectable in upper respiratoy specimens during the acute phase of infection. The lowest concentration of SARS-CoV-2 viral copies this assay can detect is 131 copies/mL. A negative result does not preclude SARS-Cov-2 infection and should not be used as the sole basis for  treatment or other patient management decisions. A negative result may occur with  improper specimen collection/handling, submission of specimen other than nasopharyngeal swab, presence of viral mutation(s) within the areas targeted by this assay, and inadequate number of viral copies (<131 copies/mL). A negative result must be combined with clinical observations, patient history, and epidemiological information. The expected result is Negative. Fact Sheet for Patients:  PinkCheek.be Fact Sheet for Healthcare Providers:  GravelBags.it This test is not yet ap proved or cleared by the [place] FDA and  has been authorized for detection and/or diagnosis of SARS-CoV-2 by FDA under an Emergency Use Authorization (EUA). This EUA will remain  in effect (meaning this test can be used) for the duration of the COVID-19 declaration under Section 564(b)(1) of the Act, 21 U.S.C. section 360bbb-3(b)(1), unless the authorization is terminated or revoked sooner.    Influenza A by PCR NEGATIVE NEGATIVE Final   Influenza B by PCR NEGATIVE NEGATIVE Final    Comment: (NOTE) The Xpert Xpress SARS-CoV-2/FLU/RSV assay is intended as an aid in  the diagnosis of influenza from Nasopharyngeal swab specimens and  should not be used as a sole basis for treatment. Nasal washings and  aspirates are unacceptable for Xpert Xpress SARS-CoV-2/FLU/RSV  testing. Fact Sheet for Patients: PinkCheek.be Fact Sheet for Healthcare Providers: GravelBags.it This test is not yet approved or cleared by the [place] FDA and  has been authorized for detection and/or diagnosis of SARS-CoV-2 by  FDA under an Emergency Use Authorization (EUA). This EUA will remain  in effect (meaning this test can be used) for the duration of the  Covid-19 declaration under Section 564(b)(1) of the Act, 21  U.S.C. section  360bbb-3(b)(1), unless the authorization is  terminated or revoked. Performed at [facility], [place] [address], [place], [place] [address]      Time coordinating discharge: Over 30 minutes  SIGNED:  Little Ishikawa, DO [facility] 12/13/2019, 7:18 AM Pager   If 7PM-7AM, please contact night-coverage [email or web address]

## 2019-12-14 DIAGNOSIS — R55 Syncope and collapse: Secondary | ICD-10-CM | POA: Diagnosis not present

## 2019-12-14 NOTE — Progress Notes (Signed)
PROGRESS NOTE  Brief Narrative: [name] is a 79 y.o. female with a history of alcohol abuse, PAF, tremors, breast CA, and right hip surgery, recently discharged to SNF who peft prior to recommendations due to not wanting to forfeit monthly check who returned to the ED 4/29 due to syncope and fall at home. She was not orthostatic, NSR w/RBBB on ECG, CTA chest negative for PE, CT head unremarkable, CBC unremarkable. Work up suggested UTI though pt had no symptoms and antibiotics were discontinued. Due to medical stability, discharge was pursued (see DC summary 5/1) to SNF based on PT/OT recommendations. Insurance authorization is pending.   Subjective: Feels well, no complaints. Ready to go to SNF.  Objective: BP (!) 154/81 (BP Location: Left Arm)   Pulse 65   Temp 98 F (36.7 C) (Oral)   Resp 17   Ht 5\' 10"  (1.778 m)   Wt 58.1 kg   SpO2 99%   BMI 18.38 kg/m   Gen: No distress Pulm: Clear and nonlabored on room air  CV: RRR, no murmur, no JVD, no edema Neuro: Alert and oriented. No focal deficits.  Assessment & Plan: No change to plan as outlined on DC summary from 5/1. Patient remains hemodynamically and medically stable for discharge. Will need SNF rehabilitation for which insurance authorization remains pending.   Patrecia Pour, MD Pager on amion 12/14/2019, 5:36 PM

## 2019-12-14 NOTE — TOC Progression Note (Signed)
Transition of Care [facility]) - Progression Note    Patient Details  Name: [name] MRN: [id number] Date of Birth: [birth year]  Transition of Care [facility]) CM/SW [place], LCSW Phone Number: 12/14/2019, 10:38 AM  Clinical Narrative: CSW contacted [place] regarding insurance authorization for patient to receive skilled nursing facility care at [facility]. Per [place] the authorization remains under review and they will follow-up with social worker when insurance authorization status changes. CSW continuing to follow for discharge supports.        Expected Discharge Plan: Skilled Nursing Facility Barriers to Discharge: Insurance Authorization  Expected Discharge Plan and Services Expected Discharge Plan: [place]   Discharge Planning Services: CM Consult Post Acute Care Choice: [place] arrangements for the past 2 months: Single Family Home Expected Discharge Date: 12/12/19               DME Arranged: N/A DME Agency: NA       HH Arranged: NA           Social Determinants of Health (SDOH) Interventions    Readmission Risk Interventions No flowsheet data found.

## 2019-12-15 DIAGNOSIS — F1010 Alcohol abuse, uncomplicated: Secondary | ICD-10-CM | POA: Diagnosis not present

## 2019-12-15 DIAGNOSIS — C50411 Malignant neoplasm of upper-outer quadrant of right female breast: Secondary | ICD-10-CM | POA: Diagnosis not present

## 2019-12-15 DIAGNOSIS — Z8673 Personal history of transient ischemic attack (TIA), and cerebral infarction without residual deficits: Secondary | ICD-10-CM | POA: Diagnosis not present

## 2019-12-15 DIAGNOSIS — R55 Syncope and collapse: Secondary | ICD-10-CM | POA: Diagnosis not present

## 2019-12-15 NOTE — Progress Notes (Signed)
RN spoke with Sarina Ser, RN at [facility] and gave her report on the patient. IV has been removed and pt is notified that she will be discharging. [facility] has been called for noon

## 2019-12-15 NOTE — Social Work (Signed)
Clinical Social Worker facilitated patient discharge including contacting patient family and facility to confirm patient discharge plans.  Clinical information faxed to facility and family agreeable with plan.  CSW arranged ambulance transport via [facility] to [place] RN to call [phone]  with report prior to discharge.  Clinical Social Worker will sign off for now as social work intervention is no longer needed. Please consult [place] again if new need arises.  Westley Hummer, MSW, LCSW Clinical Social Worker

## 2019-12-15 NOTE — Progress Notes (Signed)
RN called [employer] and spoke to [employer] receptionist who transferred me to the RN that would be receiving the RN but she did not answer will try again shortly.

## 2019-12-15 NOTE — Discharge Summary (Signed)
Physician Discharge Summary  [name] [id number] DOB: 1941-01-27 DOA: 12/11/2019  PCP: Josetta Huddle, MD  Admit date: 12/11/2019 Discharge date: 12/15/2019  Admitted From: Home Disposition: Home  Recommendations for Outpatient Follow-up:  1. Follow up with PCP in 1-2 weeks 2. Please obtain BMP/CBC in one week  Discharge Condition: Stable CODE STATUS: Full Diet recommendation: As tolerated  Brief/Interim Summary: [name] is a 79 y.o. female with history of paroxysmal atrial fibrillation alcohol abuse tremors history of breast cancer who had a recent right hip surgery and was discharged to rehab eventually being discharged back home was brought to the ER after patient had two syncopal episode at home.  Patient states the first episode happened when patient was walking to the bathroom and coming back at around 4:30 AM.  She felt slightly diaphoretic and passed out.  Did not hit her head at that time.  The second episode happened later and at that time which patient states she may have hit her head.  Denies any incontinence of urine or bowel.  Admits to drinking alcohol usually 2-3 beers a day. Denies any chest pain shortness breath nausea vomiting or diarrhea.  Uses a walker to walk after the surgery. In the ER patient was not orthostatic blood pressure mildly elevated.  EKG shows normal sinus rhythm with RBBB.  Labs show potassium of 3.4 high sensitive troponin was negative D-dimer was mildly elevated CT angiogram of the chest was negative for pulmonary embolism CT head is unremarkable.  CBC unremarkable.  Covid test was negative  Patient admitted as above after multiple episodes of syncope at home, appear to be after standing to walk consistent with orthostatic syncope. Patient admits to very poor p.o. intake since being discharged from rehab previously after recent hip fracture and repair.  We discussed the need for ongoing adequate p.o. intake especially liquids and that she  should stop drinking alcohol which only increases her fall risk which is more elevated than usual given her recent surgery.  She continues to use a walker as discussed. Patient's imaging otherwise is unremarkable, CT angio of the chest unremarkable; CT head no acute findings, labs remain without abnormality other than elevated hemoglobin likely consistent with dehydration given hemoconcentration.  At admission patient was initiated on antibiotics for presumed UTI but patient refutes any symptoms and UA is unremarkable -as such antibiotics were discontinued. Patient evaluated by physical therapy late yesterday, patient currently requesting evaluation for SNF - it is apparent that she left her last SNF stay early due to concerns over payment as "they wanted her check" - PT agrees recommends SNF placement due to ongoing balance issues and weakness.  Case management aware, already working on disposition and approval.  Hopefully patient can discharge to SNF in the next 12 to 24 hours.   Patient COVID-19 swab is negative.   Discharge Diagnoses:  Principal Problem:   Syncope Active Problems:   Alcohol abuse   PAF (paroxysmal atrial fibrillation) (HCC)   H/O: CVA (cerebrovascular accident)   Malignant neoplasm of upper-outer quadrant of right breast in female, estrogen receptor positive [name]  Discharge Instructions    Call MD for:  difficulty breathing, headache or visual disturbances   Complete by: As directed    Call MD for:  extreme fatigue   Complete by: As directed    Call MD for:  hives   Complete by: As directed    Call MD for:  persistant dizziness or light-headedness  Complete by: As directed    Call MD for:  persistant nausea and vomiting   Complete by: As directed    Call MD for:  severe uncontrolled pain   Complete by: As directed    Call MD for:  temperature >100.4   Complete by: As directed    Diet - low sodium heart healthy   Complete by: As  directed    Increase activity slowly   Complete by: As directed      Allergies as of 12/15/2019      Reactions   Vicodin [hydrocodone-acetaminophen]    UNSPECIFIED REACTION    Ambien [zolpidem Tartrate] Other (See Comments)   Causes nightmares      Medication List    STOP taking these medications   enoxaparin 40 MG/0.4ML injection Commonly known as: LOVENOX   oxyCODONE 5 MG immediate release tablet Commonly known as: Oxy IR/ROXICODONE   traMADol 50 MG tablet Commonly known as: ULTRAM     TAKE these medications   acetaminophen 325 MG tablet Commonly known as: TYLENOL Take 325 mg by mouth every 6 (six) hours as needed for moderate pain.   ascorbic acid 500 MG tablet Commonly known as: VITAMIN C Take 1 tablet (500 mg total) by mouth daily.   docusate sodium 100 MG capsule Commonly known as: COLACE Take 1 capsule (100 mg total) by mouth 2 (two) times daily.   ipratropium-albuterol 0.5-2.5 (3) MG/3ML Soln Commonly known as: DUONEB Take 3 mLs by nebulization every 4 (four) hours as needed. What changed: reasons to take this   metoprolol succinate 25 MG 24 hr tablet Commonly known as: TOPROL-XL Take 25 mg by mouth daily.   multivitamin with minerals Tabs tablet Take 1 tablet by mouth daily.   nitroGLYCERIN 0.4 MG SL tablet Commonly known as: NITROSTAT Place 1 tablet under the tongue every 5 (five) minutes as needed for chest pain.   VITAMIN 0000000 IJ Inject 1 application as directed every 30 (thirty) days.   vitamin B-12 1000 MCG tablet Commonly known as: CYANOCOBALAMIN Take 1,000 mcg by mouth daily.   Vitamin D3 25 MCG tablet Commonly known as: Vitamin D Take 2 tablets (2,000 Units total) by mouth 2 (two) times daily.      Contact information for after-discharge care    Destination    [facility] .   Service: Skilled Nursing Contact information: [address] [address] [phone]              Allergies  Allergen Reactions  . Vicodin [Hydrocodone-Acetaminophen]     UNSPECIFIED REACTION   . Ambien [Zolpidem Tartrate] Other (See Comments)    Causes nightmares    Procedures/Studies: CT Head Wo Contrast  Result Date: 12/11/2019 CLINICAL DATA:  Syncope EXAM: CT HEAD WITHOUT CONTRAST TECHNIQUE: Contiguous axial images were obtained from the base of the skull through the vertex without intravenous contrast. COMPARISON:  CT brain 11/04/2019 FINDINGS: Brain: No acute territorial infarction, hemorrhage or intracranial mass. Atrophy and mild chronic small vessel ischemic change of the white matter. Chronic lacunar infarct within the left white matter. Stable ventricle size. Vascular: No hyperdense vessels.  Carotid vascular calcification Skull: Normal. Negative for fracture or focal lesion. Sinuses/Orbits: No acute finding. Other: None IMPRESSION: 1. No CT evidence for acute intracranial abnormality. 2. Atrophy and mild chronic small vessel ischemic change of the white matter Electronically Signed   By: Donavan Foil M.D.   On: 12/11/2019 20:59   CT Angio Chest  PE W and/or Wo Contrast  Result Date: 12/12/2019 CLINICAL DATA:  Syncope EXAM: CT ANGIOGRAPHY CHEST WITH CONTRAST TECHNIQUE: Multidetector CT imaging of the chest was performed using the standard protocol during bolus administration of intravenous contrast. Multiplanar CT image reconstructions and MIPs were obtained to evaluate the vascular anatomy. CONTRAST:  173mL OMNIPAQUE IOHEXOL 350 MG/ML SOLN COMPARISON:  11/06/2019 CTA chest FINDINGS: Cardiovascular: Contrast injection is sufficient to demonstrate satisfactory opacification of the pulmonary arteries to the segmental level. There is no pulmonary embolus or evidence of right heart strain. The size of the main pulmonary artery is normal. Heart size is normal, with no pericardial effusion. The course and caliber of the aorta are normal. There is mild atherosclerotic calcification.  Opacification decreased due to pulmonary arterial phase contrast bolus timing. Mediastinum/Nodes: No mediastinal, hilar or axillary lymphadenopathy. Normal visualized thyroid. Thoracic esophageal course is normal. Lungs/Pleura: Airways are patent. No pleural effusion, lobar consolidation, pneumothorax or pulmonary infarction. Upper Abdomen: Contrast bolus timing is not optimized for evaluation of the abdominal organs. The visualized portions of the organs of the upper abdomen are normal. Musculoskeletal: No chest wall abnormality. No bony spinal canal stenosis. Review of the MIP images confirms the above findings. IMPRESSION: No pulmonary embolus or other acute thoracic abnormality. Aortic Atherosclerosis (ICD10-I70.0). Electronically Signed   By: Ulyses Jarred M.D.   On: 12/12/2019 02:06   Subjective: No new complaints, ready to go to SNF.  Discharge Exam: BP (!) 166/87 (BP Location: Left Arm)   Pulse (!) 53   Temp 97.7 F (36.5 C) (Oral)   Resp 16   Ht 5\' 10"  (1.778 m)   Wt 58.1 kg   SpO2 100%   BMI 18.38 kg/m   Gen: Non toxic, no distress Pulm: Clear and nonlabored on room air  CV: RRR, no murmur, no JVD, no edema GI: Soft, NT, ND, +BS  Neuro: Alert and oriented. No focal deficits. Ext: Warm, no deformities Skin: No rashes, lesions or ulcers on visualized skin  The results of significant diagnostics from this hospitalization (including imaging, microbiology, ancillary and laboratory) are listed below for reference.    Microbiology: Recent Results (from the past 240 hour(s))  Respiratory Panel by RT PCR (Flu A&B, Covid) - Nasopharyngeal Swab     Status: None   Collection Time: 12/12/19  2:56 AM   Specimen: Nasopharyngeal Swab  Result Value Ref Range Status   SARS Coronavirus 2 by RT PCR NEGATIVE NEGATIVE Final    Comment: (NOTE) SARS-CoV-2 target nucleic acids are NOT DETECTED. The SARS-CoV-2 RNA is generally detectable in upper respiratoy specimens during the acute phase of  infection. The lowest concentration of SARS-CoV-2 viral copies this assay can detect is 131 copies/mL. A negative result does not preclude SARS-Cov-2 infection and should not be used as the sole basis for treatment or other patient management decisions. A negative result may occur with  improper specimen collection/handling, submission of specimen other than nasopharyngeal swab, presence of viral mutation(s) within the areas targeted by this assay, and inadequate number of viral copies (<131 copies/mL). A negative result must be combined with clinical observations, patient history, and epidemiological information. The expected result is Negative. Fact Sheet for Patients:  PinkCheek.be Fact Sheet for Healthcare Providers:  GravelBags.it This test is not yet ap proved or cleared by the [place] FDA and  has been authorized for detection and/or diagnosis of SARS-CoV-2 by FDA under an Emergency Use Authorization (EUA). This EUA will remain  in effect (meaning this test can  be used) for the duration of the COVID-19 declaration under Section 564(b)(1) of the Act, 21 U.S.C. section 360bbb-3(b)(1), unless the authorization is terminated or revoked sooner.    Influenza A by PCR NEGATIVE NEGATIVE Final   Influenza B by PCR NEGATIVE NEGATIVE Final    Comment: (NOTE) The Xpert Xpress SARS-CoV-2/FLU/RSV assay is intended as an aid in  the diagnosis of influenza from Nasopharyngeal swab specimens and  should not be used as a sole basis for treatment. Nasal washings and  aspirates are unacceptable for Xpert Xpress SARS-CoV-2/FLU/RSV  testing. Fact Sheet for Patients: PinkCheek.be Fact Sheet for Healthcare Providers: GravelBags.it This test is not yet approved or cleared by the [place] FDA and  has been authorized for detection and/or diagnosis of SARS-CoV-2 by  FDA under  an Emergency Use Authorization (EUA). This EUA will remain  in effect (meaning this test can be used) for the duration of the  Covid-19 declaration under Section 564(b)(1) of the Act, 21  U.S.C. section 360bbb-3(b)(1), unless the authorization is  terminated or revoked. Performed at [facility], [place] [address], [place], [place] [address]      Labs: BNP (last 3 results) Recent Labs    11/06/19 1830  BNP 123456*   Basic Metabolic Panel: Recent Labs  Lab 12/11/19 1640 12/11/19 2118 12/12/19 0542  NA 139  --   --   K 3.4*  --   --   CL 102  --   --   CO2 23  --   --   GLUCOSE 128*  --   --   BUN 7*  --   --   CREATININE 0.66  --  0.58  CALCIUM 8.8*  --   --   MG  --  1.7  --    Liver Function Tests: Recent Labs  Lab 12/11/19 2118  AST 16  ALT 13  ALKPHOS 142*  BILITOT 0.5  PROT 6.8  ALBUMIN 3.6   No results for input(s): LIPASE, AMYLASE in the last 168 hours. No results for input(s): AMMONIA in the last 168 hours. CBC: Recent Labs  Lab 12/11/19 1640 12/12/19 0542  WBC 8.9 6.1  HGB 14.2 16.3*  HCT 44.4 51.1*  MCV 96.1 98.5  PLT 218 235   Cardiac Enzymes: No results for input(s): CKTOTAL, CKMB, CKMBINDEX, TROPONINI in the last 168 hours. BNP: Invalid input(s): POCBNP CBG: Recent Labs  Lab 12/11/19 1653  GLUCAP 126*   D-Dimer No results for input(s): DDIMER in the last 72 hours. Hgb A1c No results for input(s): HGBA1C in the last 72 hours. Lipid Profile No results for input(s): CHOL, HDL, LDLCALC, TRIG, CHOLHDL, LDLDIRECT in the last 72 hours. Thyroid function studies No results for input(s): TSH, T4TOTAL, T3FREE, THYROIDAB in the last 72 hours.  Invalid input(s): FREET3 Anemia work up No results for input(s): VITAMINB12, FOLATE, FERRITIN, TIBC, IRON, RETICCTPCT in the last 72 hours. Urinalysis    Component Value Date/Time   COLORURINE YELLOW 12/11/2019 2231   APPEARANCEUR CLEAR 12/11/2019 2231   LABSPEC 1.008 12/11/2019 2231    PHURINE 5.0 12/11/2019 2231   GLUCOSEU NEGATIVE 12/11/2019 2231   HGBUR NEGATIVE 12/11/2019 2231   BILIRUBINUR NEGATIVE 12/11/2019 2231   KETONESUR NEGATIVE 12/11/2019 2231   PROTEINUR NEGATIVE 12/11/2019 2231   UROBILINOGEN 0.2 09/11/2014 2309   NITRITE NEGATIVE 12/11/2019 2231   LEUKOCYTESUR SMALL (A) 12/11/2019 2231   Sepsis Labs Invalid input(s): PROCALCITONIN,  WBC,  LACTICIDVEN Microbiology Recent Results (from the past 240 hour(s))  Respiratory Panel by RT PCR (Flu A&B, Covid) - Nasopharyngeal Swab     Status: None   Collection Time: 12/12/19  2:56 AM   Specimen: Nasopharyngeal Swab  Result Value Ref Range Status   SARS Coronavirus 2 by RT PCR NEGATIVE NEGATIVE Final    Comment: (NOTE) SARS-CoV-2 target nucleic acids are NOT DETECTED. The SARS-CoV-2 RNA is generally detectable in upper respiratoy specimens during the acute phase of infection. The lowest concentration of SARS-CoV-2 viral copies this assay can detect is 131 copies/mL. A negative result does not preclude SARS-Cov-2 infection and should not be used as the sole basis for treatment or other patient management decisions. A negative result may occur with  improper specimen collection/handling, submission of specimen other than nasopharyngeal swab, presence of viral mutation(s) within the areas targeted by this assay, and inadequate number of viral copies (<131 copies/mL). A negative result must be combined with clinical observations, patient history, and epidemiological information. The expected result is Negative. Fact Sheet for Patients:  PinkCheek.be Fact Sheet for Healthcare Providers:  GravelBags.it This test is not yet ap proved or cleared by the [place] FDA and  has been authorized for detection and/or diagnosis of SARS-CoV-2 by FDA under an Emergency Use Authorization (EUA). This EUA will remain  in effect (meaning this test can be used) for  the duration of the COVID-19 declaration under Section 564(b)(1) of the Act, 21 U.S.C. section 360bbb-3(b)(1), unless the authorization is terminated or revoked sooner.    Influenza A by PCR NEGATIVE NEGATIVE Final   Influenza B by PCR NEGATIVE NEGATIVE Final    Comment: (NOTE) The Xpert Xpress SARS-CoV-2/FLU/RSV assay is intended as an aid in  the diagnosis of influenza from Nasopharyngeal swab specimens and  should not be used as a sole basis for treatment. Nasal washings and  aspirates are unacceptable for Xpert Xpress SARS-CoV-2/FLU/RSV  testing. Fact Sheet for Patients: PinkCheek.be Fact Sheet for Healthcare Providers: GravelBags.it This test is not yet approved or cleared by the [place] FDA and  has been authorized for detection and/or diagnosis of SARS-CoV-2 by  FDA under an Emergency Use Authorization (EUA). This EUA will remain  in effect (meaning this test can be used) for the duration of the  Covid-19 declaration under Section 564(b)(1) of the Act, 21  U.S.C. section 360bbb-3(b)(1), unless the authorization is  terminated or revoked. Performed at [facility], [place] [address] [place], [place] [address]      Time coordinating discharge: Over 30 minutes  SIGNED:  Patrecia Pour, MD [facility] 12/15/2019, 8:48 AM

## 2019-12-15 NOTE — TOC Progression Note (Signed)
Transition of Care [facility]) - Progression Note    Patient Details  Name: [name] MRN: [id number] Date of Birth: [birth year]  [facility]) CM/SW Page,  Phone Number: 12/15/2019, 8:27 AM  Clinical Narrative:    Auth received, approved 5/2-5/5.  [id number]  Information provided to admissions liaison Driscoll. CSW has also sent an update to MD requesting new d/c summary.  Expected Discharge Plan: Skilled Nursing Facility Barriers to Discharge: Insurance Authorization  Expected Discharge Plan and Services Expected Discharge Plan: [place]   Discharge Planning Services: CM Consult Post Acute Care Choice: [place] arrangements for the past 2 months: Single Family Home Expected Discharge Date: 12/15/19               DME Arranged: N/A DME Agency: NA HH Arranged: NA  Readmission Risk Interventions No flowsheet data found.

## 2020-06-23 IMAGING — CR RIGHT FOREARM - 2 VIEW
3 series · 3 of 3 positions shown · non-contrast
Comparison: None.

CLINICAL DATA: Fell.  Right forearm pain.

EXAM:
RIGHT FOREARM - 2 VIEW

[x forearm lat right (1 of 2)]
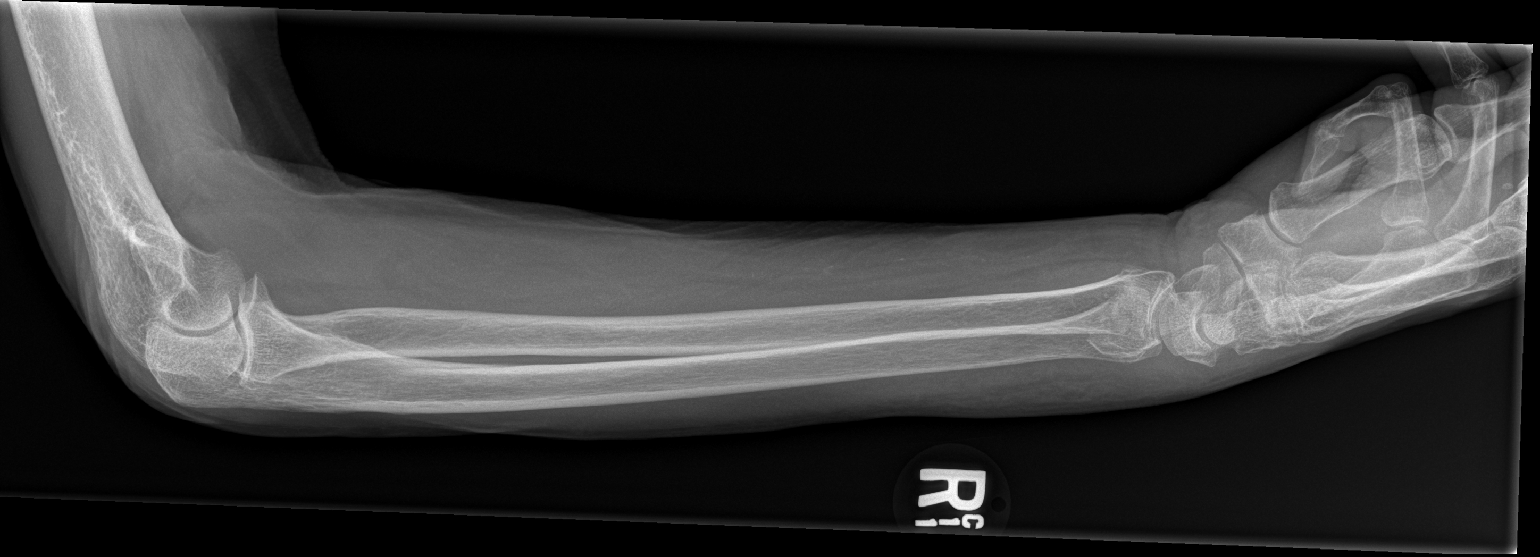

[x forearm ap right]
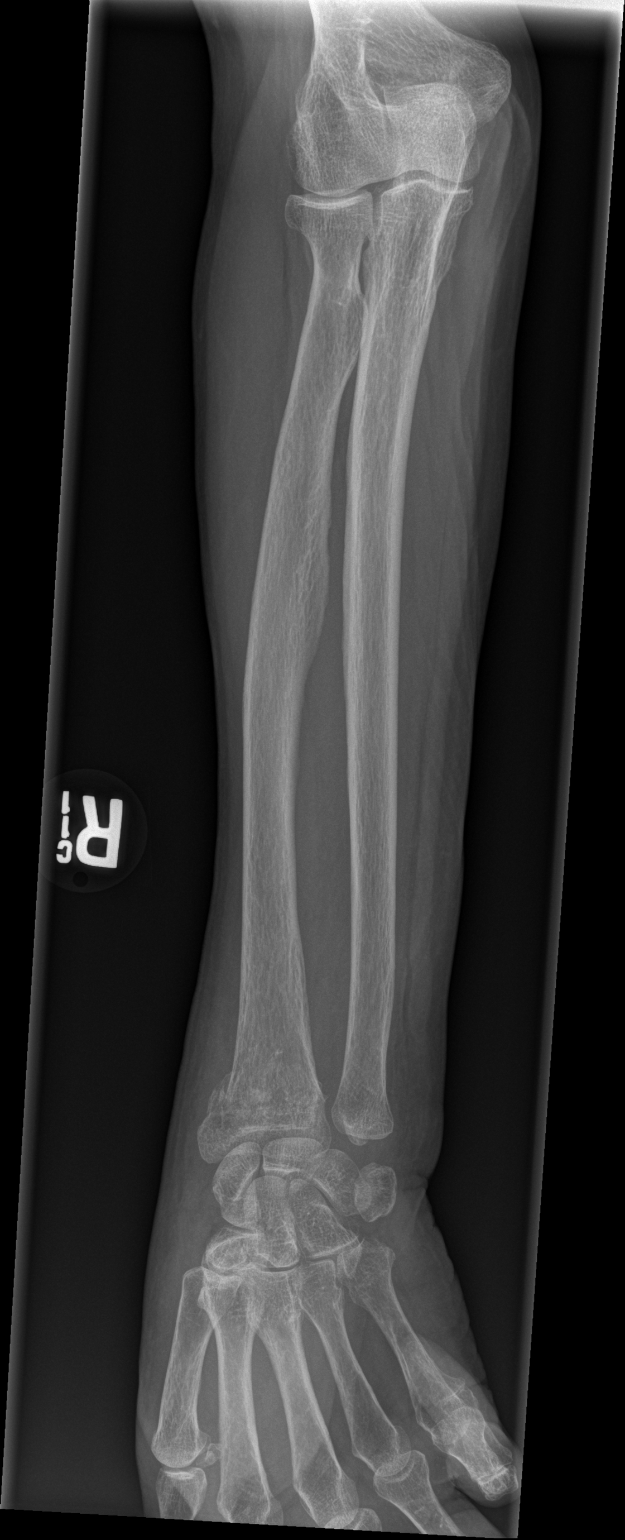

[x forearm lat right (2 of 2)]
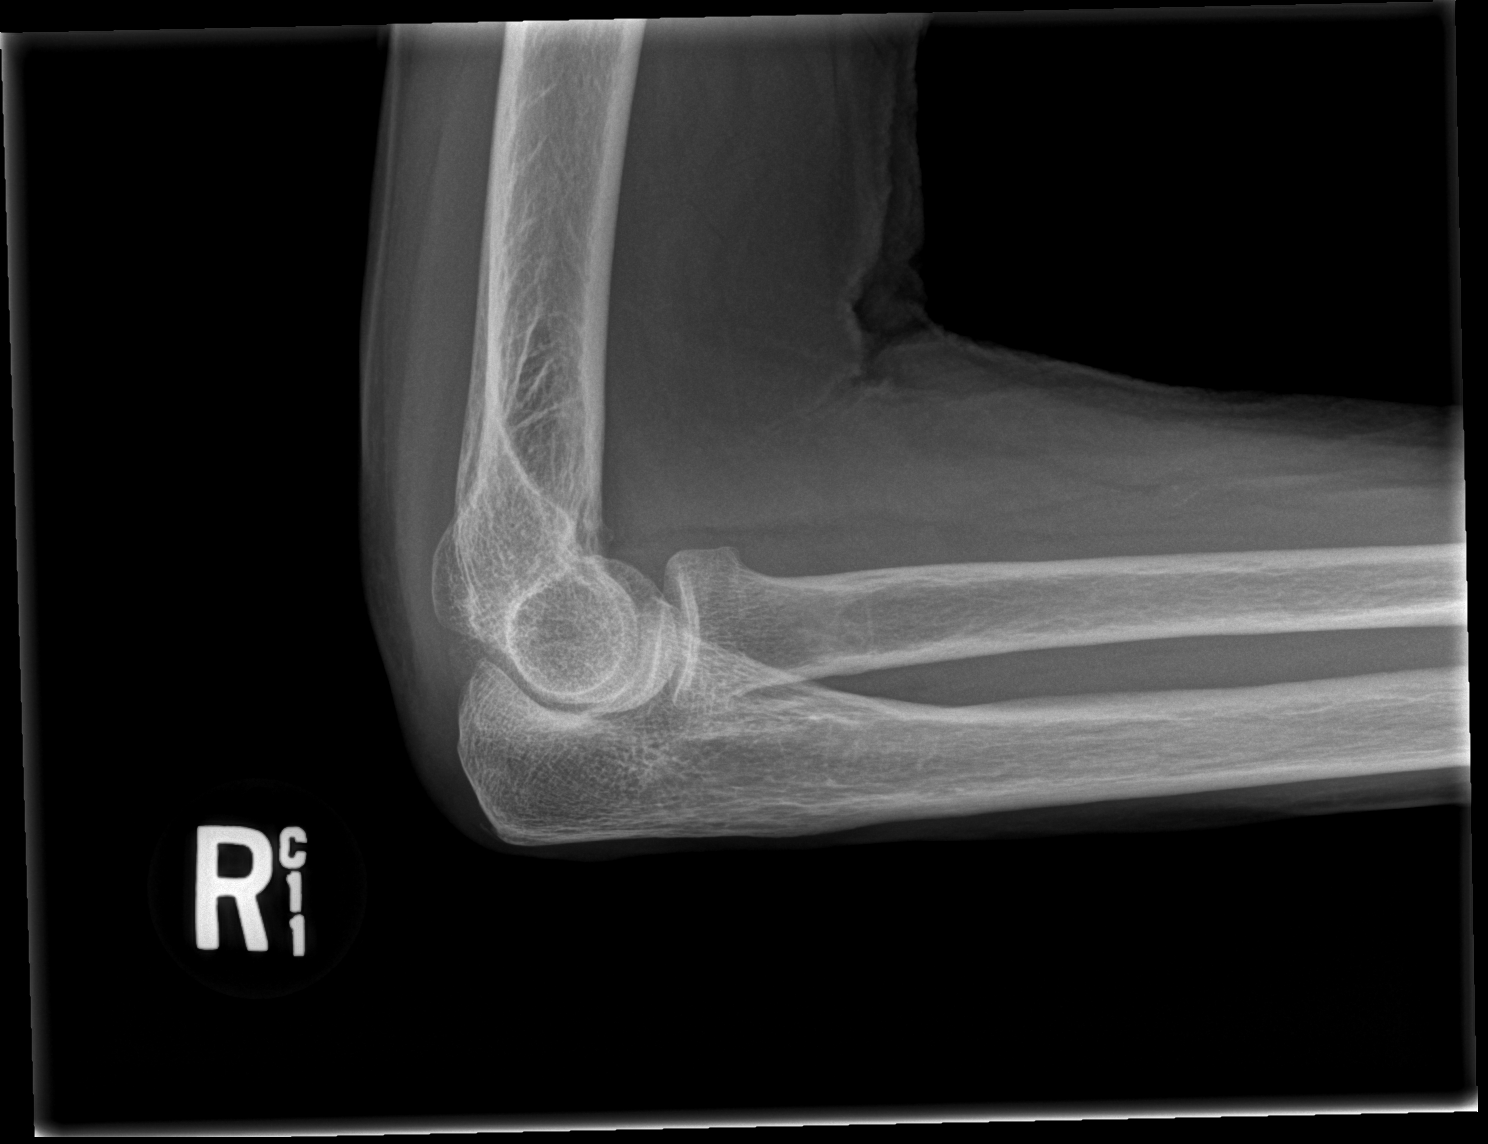

[3 of 3 positions shown; findings below may reference images not displayed]

FINDINGS: Collies fracture noted at the wrist. The elbow joint is maintained.
Mild degenerative changes. No forearm fractures.
IMPRESSION: Distal radius fracture/Colles fracture.

No forearm fractures.

## 2022-12-09 ENCOUNTER — Emergency Department (HOSPITAL_COMMUNITY): Payer: 59

## 2022-12-09 ENCOUNTER — Other Ambulatory Visit: Payer: Self-pay

## 2022-12-09 ENCOUNTER — Encounter (HOSPITAL_COMMUNITY): Payer: Self-pay

## 2022-12-09 ENCOUNTER — Emergency Department (HOSPITAL_COMMUNITY)
Admission: EM | Admit: 2022-12-09 | Discharge: 2022-12-10 | Disposition: A | Discharge status: Home / Self Care | Payer: 59 | Attending: Emergency Medicine | Admitting: Emergency Medicine

## 2022-12-09 DIAGNOSIS — S22080A Wedge compression fracture of T11-T12 vertebra, initial encounter for closed fracture: Secondary | ICD-10-CM | POA: Insufficient documentation

## 2022-12-09 DIAGNOSIS — S32020A Wedge compression fracture of second lumbar vertebra, initial encounter for closed fracture: Secondary | ICD-10-CM | POA: Diagnosis not present

## 2022-12-09 DIAGNOSIS — S32000A Wedge compression fracture of unspecified lumbar vertebra, initial encounter for closed fracture: Secondary | ICD-10-CM

## 2022-12-09 DIAGNOSIS — W19XXXA Unspecified fall, initial encounter: Secondary | ICD-10-CM

## 2022-12-09 DIAGNOSIS — S22000A Wedge compression fracture of unspecified thoracic vertebra, initial encounter for closed fracture: Secondary | ICD-10-CM

## 2022-12-09 DIAGNOSIS — W010XXA Fall on same level from slipping, tripping and stumbling without subsequent striking against object, initial encounter: Secondary | ICD-10-CM | POA: Insufficient documentation

## 2022-12-09 DIAGNOSIS — M25522 Pain in left elbow: Secondary | ICD-10-CM | POA: Insufficient documentation

## 2022-12-09 DIAGNOSIS — S32010A Wedge compression fracture of first lumbar vertebra, initial encounter for closed fracture: Secondary | ICD-10-CM | POA: Insufficient documentation

## 2022-12-09 DIAGNOSIS — M545 Low back pain, unspecified: Secondary | ICD-10-CM | POA: Diagnosis present

## 2022-12-09 MED ORDER — IBUPROFEN 600 MG PO TABS: 600.0000 mg | ORAL_TABLET | Freq: Three times a day (TID) | ORAL | 0 refills | Status: AC | PRN

## 2022-12-09 MED ORDER — IBUPROFEN 200 MG PO TABS
400.0000 mg | ORAL_TABLET | Freq: Once | ORAL | Status: AC
  Administered 2022-12-09: 400 mg via ORAL
  Filled 2022-12-09: qty 2

## 2022-12-09 MED ORDER — TRAMADOL HCL 50 MG PO TABS
50.0000 mg | ORAL_TABLET | Freq: Once | ORAL | Status: AC
  Administered 2022-12-09: 50 mg via ORAL
  Filled 2022-12-09: qty 1

## 2022-12-09 MED ORDER — ACETAMINOPHEN 325 MG PO TABS
650.0000 mg | ORAL_TABLET | Freq: Once | ORAL | Status: AC
  Administered 2022-12-09: 650 mg via ORAL
  Filled 2022-12-09: qty 2

## 2022-12-09 MED ORDER — TRAMADOL HCL 50 MG PO TABS: 50.0000 mg | ORAL_TABLET | Freq: Two times a day (BID) | ORAL | 0 refills | Status: DC | PRN

## 2022-12-09 MED ORDER — ACETAMINOPHEN 325 MG PO TABS: 650.0000 mg | ORAL_TABLET | Freq: Four times a day (QID) | ORAL | 0 refills | Status: AC | PRN

## 2022-12-09 NOTE — ED Provider Notes (Signed)
[place] [facility] AT [facility] Provider Note   CSN: [id number] Arrival date & time: 12/09/22  1432     History  Chief Complaint  Patient presents with   [name] is a 82 y.o. female presented to the ER with a fall and complaint of back pain and left elbow pain.  The patient had a witnessed fall as she lost her balance leaving a restaurant yesterday afternoon.  Her friend was present at the bedside reports that the patient fell onto her left side, predominantly onto her left outstretched arm.  The patient was complaining of pain in her left elbow afterwards, as well as soreness and pain in her lower back.  She said the pain made her feel nauseated and she vomited once yesterday.  The pain is worse with any movement.  She also report some soreness in her left knee.  She has been able to ambulate since then.  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  She only intermittently uses a walker or cane.  She lives at home with her son.  HPI     Home Medications Prior to Admission medications   Medication Sig Start Date End Date Taking? Authorizing Provider  acetaminophen (TYLENOL) 325 MG tablet Take 2 tablets (650 mg total) by mouth every 6 (six) hours as needed for up to 30 doses for moderate pain or mild pain. 12/09/22  Yes Eddie Payette, Kermit Balo, MD  ibuprofen (ADVIL) 600 MG tablet Take 1 tablet (600 mg total) by mouth every 8 (eight) hours as needed for moderate pain or mild pain. 12/09/22 01/08/23 Yes Zeddie Njie, Kermit Balo, MD  traMADol (ULTRAM) 50 MG tablet Take 1 tablet (50 mg total) by mouth every 12 (twelve) hours as needed for up to 10 doses. 12/09/22  Yes Parthena Fergeson, Kermit Balo, MD  acetaminophen (TYLENOL) 325 MG tablet Take 325 mg by mouth every 6 (six) hours as needed for moderate pain.    [provider]  Cyanocobalamin (VITAMIN B-12 IJ) Inject 1 application as directed every 30 (thirty) days.     [provider]  docusate sodium (COLACE) 100 MG capsule Take 1 capsule (100 mg total) by mouth 2 (two) times daily. 11/09/19   Jerald Kief, MD  ipratropium-albuterol (DUONEB) 0.5-2.5 (3) MG/3ML SOLN Take 3 mLs by nebulization every 4 (four) hours as needed. Patient taking differently: Take 3 mLs by nebulization every 4 (four) hours as needed (shortness of breath.).  11/09/19   Jerald Kief, MD  metoprolol succinate (TOPROL-XL) 25 MG 24 hr tablet Take 25 mg by mouth daily.    [provider]  nitroGLYCERIN (NITROSTAT) 0.4 MG SL tablet Place 1 tablet under the tongue every 5 (five) minutes as needed for chest pain.  11/13/17   [provider]  vitamin B-12 (CYANOCOBALAMIN) 1000 MCG tablet Take 1,000 mcg by mouth daily.    [provider]      Allergies    Vicodin [hydrocodone-acetaminophen] and Ambien [zolpidem tartrate]    Review of Systems   Review of Systems  Physical Exam Updated Vital Signs BP (!) 185/93   Pulse 64   Temp 98.3 F (36.8 C)   Resp 16   Ht 5\' 10"  (1.778 m)   Wt 55.3 kg   SpO2 98%   BMI 17.51 kg/m  Physical Exam Constitutional:      General: She is not in acute distress.  Comments: Thin, frail  HENT:     Head: Normocephalic and atraumatic.  Eyes:     Conjunctiva/sclera: Conjunctivae normal.     Pupils: Pupils are equal, round, and reactive to light.  Cardiovascular:     Rate and Rhythm: Normal rate and regular rhythm.  Pulmonary:     Effort: Pulmonary effort is normal. No respiratory distress.  Abdominal:     General: There is no distension.     Tenderness: There is no abdominal tenderness. There is no guarding.  Musculoskeletal:     Comments: Lumbar spinal midline tenderness, no thoracic or cervical spinal midline tenderness No instability of the pelvis, full range of motion of the bilateral hips, no focal isolated tenderness of the patellar head or the distal tibia. Patient has full range of motion of the  bilateral shoulders, no visible deformities.  Mild tenderness of the distal left humerus, full range of motion of the left elbow.  Skin:    General: Skin is warm and dry.  Neurological:     General: No focal deficit present.     Mental Status: She is alert. Mental status is at baseline.  Psychiatric:        Mood and Affect: Mood normal.        Behavior: Behavior normal.     ED Results / Procedures / Treatments   Labs (all labs ordered are listed, but only abnormal results are displayed) Labs Reviewed - No data to display  EKG None  Radiology CT Lumbar Spine Wo Contrast  Result Date: 12/09/2022 CLINICAL DATA:  Recent fall with low back pain, initial encounter EXAM: CT LUMBAR SPINE WITHOUT CONTRAST TECHNIQUE: Multidetector CT imaging of the lumbar spine was performed without intravenous contrast administration. Multiplanar CT image reconstructions were also generated. RADIATION DOSE REDUCTION: This exam was performed according to the departmental dose-optimization program which includes automated exposure control, adjustment of the mA and/or kV according to patient size and/or use of iterative reconstruction technique. COMPARISON:  12/12/2019 FINDINGS: Segmentation: 5 lumbar type vertebral bodies are well visualized. Alignment: No significant anterolisthesis is seen. Vertebrae: There are chronic compression deformities involving L3 and L1. T12 compression deformity is noted new from the prior exam from 2021 but appears chronic with increased sclerosis along superior endplate. Multilevel osteophytic changes are noted. Additionally facet hypertrophic changes are noted throughout the lumbar spine. Mild scoliosis concave to the right is noted centered at L2-3. Paraspinal and other soft tissues: Sliding-type hiatal hernia is noted. Diffuse atherosclerotic calcifications of the abdominal aorta are noted. Nonobstructing renal calculi are noted bilaterally. The largest of these lies on the left  measuring 6 mm. No obstructive changes are noted. No paraspinal hemorrhage is identified. Disc levels: Disc space narrowing is noted L2-3 with mild diffuse disc bulging differentially. No other significant disc pathology is noted. IMPRESSION: Chronic compression deformities involving T12, L1 and L3. The T12 fracture is new from a prior CT examination from 2021. Multilevel degenerative change with mild disc bulging at L2-3 Hiatal hernia. Bilateral nonobstructing renal calculi. Electronically Signed   By: Alcide Clever M.D.   On: 12/09/2022 20:19   DG Humerus Left  Result Date: 12/09/2022 CLINICAL DATA:  fx eval, distal humerus ttp after fall EXAM: LEFT HUMERUS - 2+ VIEW COMPARISON:  12/15/2012 FINDINGS: There is no evidence of fracture or other focal bone lesions. 3 mm metallic density projects within or on the medial soft tissues at the level of the mid humeral shaft. IMPRESSION: 1. No fracture or dislocation. 2. 3  mm metallic density within or on the medial soft tissues as above. Electronically Signed   By: Corlis Leak M.D.   On: 12/09/2022 17:35    Procedures Procedures    Medications Ordered in ED Medications  acetaminophen (TYLENOL) tablet 650 mg (650 mg Oral Given 12/09/22 1726)  ibuprofen (ADVIL) tablet 400 mg (400 mg Oral Given 12/09/22 1726)  ibuprofen (ADVIL) tablet 400 mg (400 mg Oral Given 12/09/22 2009)  traMADol (ULTRAM) tablet 50 mg (50 mg Oral Given 12/09/22 2055)    ED Course/ Medical Decision Making/ A&P                             Medical Decision Making Amount and/or Complexity of Data Reviewed Radiology: ordered.  Risk OTC drugs. Prescription drug management.   Patient is here after mechanical fall with low back pain as well as elbow pain.  X-rays of the elbow and CT of the lumbar spine have been ordered.  She does not have red flags for cauda equina syndrome and no emergent indication for MRI imaging.  She is able to ambulate.  I have a low suspicion for lower extremity  fracture or any fracture based on her physical exam.  Low suspicion for intra-abdominal organ injury, no abdominal tenderness on exam.  She reported some nausea which may be due to pain.  She has only taken Aleve for her pain at home, we can try Tylenol and Motrin here.  Supplemental history is provided by a friend at the bedside, with the patient's permission to remain in the room for the exam.  I personally viewed the patient's imaging, notable for concern for chronic compression fracture of the thoracic and lumbar spine.  The patient was not aware of these fractures, but given her clinical exam, significant pain with movement in bed, I suspect these are in fact 1 at least acute or worsening compression fracture.  Will place her in a TLSO brace.  Tramadol was given for pain.  I contacted her family to come pick her up.  I would like her fitted with a TLSO brace prior to discharge, she will need follow-up with neurosurgery.        Final Clinical Impression(s) / ED Diagnoses Final diagnoses:  Compression fracture of lumbar vertebra, unspecified lumbar vertebral level, initial encounter (HCC)  Compression fracture of thoracic vertebra, unspecified thoracic vertebral level, initial encounter (HCC)  Fall, initial encounter    Rx / DC Orders ED Discharge Orders          Ordered    traMADol (ULTRAM) 50 MG tablet  Every 12 hours PRN        12/09/22 2046    ibuprofen (ADVIL) 600 MG tablet  Every 8 hours PRN        12/09/22 2046    acetaminophen (TYLENOL) 325 MG tablet  Every 6 hours PRN        12/09/22 2046              Terald Sleeper, MD 12/09/22 2203

## 2022-12-09 NOTE — Discharge Instructions (Addendum)
You should try to wear the brace at all times, you can carefully take it off while showering but then put it back on.  Please consider purchasing a walker at home for extra stability when getting up and moving around.

## 2022-12-09 NOTE — Progress Notes (Addendum)
Orthopedic Tech Progress Note Patient Details:  [name] 1940-08-27 [id number]  Patient ID: [name], female   DOB: 10-28-1940, 82 y.o.   MRN: [id number] I made sure with the rn that the patient was going home. They said they were so I called order into [facility]. Trinna Post 12/09/2022, 9:46 PM

## 2022-12-09 NOTE — ED Triage Notes (Signed)
Pt to er, pt states that she was leaving a restaurant yesterday and the side walk was uneven and she fell.  Pt states that she has back pain, and L arm pain.  Pt pt states that she didn't hit her head or have a loc

## 2022-12-13 ENCOUNTER — Emergency Department (HOSPITAL_COMMUNITY): Payer: 59

## 2022-12-13 ENCOUNTER — Other Ambulatory Visit: Payer: Self-pay

## 2022-12-13 ENCOUNTER — Emergency Department (HOSPITAL_COMMUNITY)
Admission: EM | Admit: 2022-12-13 | Discharge: 2022-12-13 | Disposition: A | Discharge status: Home / Self Care | Payer: 59 | Attending: Emergency Medicine | Admitting: Emergency Medicine

## 2022-12-13 ENCOUNTER — Encounter (HOSPITAL_COMMUNITY): Payer: Self-pay | Admitting: Pharmacy Technician

## 2022-12-13 DIAGNOSIS — R1032 Left lower quadrant pain: Secondary | ICD-10-CM | POA: Insufficient documentation

## 2022-12-13 DIAGNOSIS — E876 Hypokalemia: Secondary | ICD-10-CM | POA: Diagnosis not present

## 2022-12-13 DIAGNOSIS — R1031 Right lower quadrant pain: Secondary | ICD-10-CM | POA: Diagnosis not present

## 2022-12-13 DIAGNOSIS — N390 Urinary tract infection, site not specified: Secondary | ICD-10-CM

## 2022-12-13 DIAGNOSIS — R1030 Lower abdominal pain, unspecified: Secondary | ICD-10-CM | POA: Diagnosis present

## 2022-12-13 LAB — CBC WITH DIFFERENTIAL/PLATELET
Abs Immature Granulocytes: 0.02 10*3/uL (ref 0.00–0.07)
Basophils Absolute: 0.1 10*3/uL (ref 0.0–0.1)
Basophils Relative: 2 %
Eosinophils Absolute: 0.5 10*3/uL (ref 0.0–0.5)
Eosinophils Relative: 8 %
HCT: 47.3 % — ABNORMAL HIGH (ref 36.0–46.0)
Hemoglobin: 15.5 g/dL — ABNORMAL HIGH (ref 12.0–15.0)
Immature Granulocytes: 0 %
Lymphocytes Relative: 31 %
Lymphs Abs: 1.9 10*3/uL (ref 0.7–4.0)
MCH: 32.7 pg (ref 26.0–34.0)
MCHC: 32.8 g/dL (ref 30.0–36.0)
MCV: 99.8 fL (ref 80.0–100.0)
Monocytes Absolute: 0.4 10*3/uL (ref 0.1–1.0)
Monocytes Relative: 7 %
Neutro Abs: 3.3 10*3/uL (ref 1.7–7.7)
Neutrophils Relative %: 52 %
Platelets: 228 10*3/uL (ref 150–400)
RBC: 4.74 MIL/uL (ref 3.87–5.11)
RDW: 13.4 % (ref 11.5–15.5)
WBC: 6.3 10*3/uL (ref 4.0–10.5)
nRBC: 0 % (ref 0.0–0.2)

## 2022-12-13 LAB — COMPREHENSIVE METABOLIC PANEL
ALT: 14 U/L (ref 0–44)
AST: 24 U/L (ref 15–41)
Albumin: 2.8 g/dL — ABNORMAL LOW (ref 3.5–5.0)
Alkaline Phosphatase: 100 U/L (ref 38–126)
Anion gap: 12 (ref 5–15)
BUN: 6 mg/dL — ABNORMAL LOW (ref 8–23)
CO2: 25 mmol/L (ref 22–32)
Calcium: 8 mg/dL — ABNORMAL LOW (ref 8.9–10.3)
Chloride: 97 mmol/L — ABNORMAL LOW (ref 98–111)
Creatinine, Ser: 0.76 mg/dL (ref 0.44–1.00)
GFR, Estimated: >60 mL/min (ref >60)
Glucose, Bld: 113 mg/dL — ABNORMAL HIGH (ref 70–99)
Potassium: 2.7 mmol/L — CL (ref 3.5–5.1)
Sodium: 134 mmol/L — ABNORMAL LOW (ref 135–145)
Total Bilirubin: 0.7 mg/dL (ref 0.3–1.2)
Total Protein: 5.9 g/dL — ABNORMAL LOW (ref 6.5–8.1)

## 2022-12-13 LAB — URINALYSIS, ROUTINE W REFLEX MICROSCOPIC
Bilirubin Urine: NEGATIVE
Glucose, UA: NEGATIVE mg/dL
Ketones, ur: NEGATIVE mg/dL
Nitrite: NEGATIVE
Protein, ur: NEGATIVE mg/dL
Specific Gravity, Urine: 1.011 (ref 1.005–1.030)
pH: 6 (ref 5.0–8.0)

## 2022-12-13 LAB — LIPASE, BLOOD: Lipase: 31 U/L (ref 11–51)

## 2022-12-13 MED ORDER — SODIUM CHLORIDE (PF) 0.9 % IJ SOLN
INTRAMUSCULAR | Status: AC
  Filled 2022-12-13: qty 50

## 2022-12-13 MED ORDER — LACTATED RINGERS IV SOLN: INTRAVENOUS | Status: DC

## 2022-12-13 MED ORDER — OXYCODONE-ACETAMINOPHEN 5-325 MG PO TABS: 1.0000 | ORAL_TABLET | Freq: Four times a day (QID) | ORAL | 0 refills | Status: AC | PRN

## 2022-12-13 MED ORDER — POTASSIUM CHLORIDE CRYS ER 20 MEQ PO TBCR
60.0000 meq | EXTENDED_RELEASE_TABLET | Freq: Once | ORAL | Status: AC
  Administered 2022-12-13: 60 meq via ORAL
  Filled 2022-12-13: qty 3

## 2022-12-13 MED ORDER — LIDOCAINE 5 % EX PTCH: 1.0000 | MEDICATED_PATCH | CUTANEOUS | 0 refills | Status: AC

## 2022-12-13 MED ORDER — METHOCARBAMOL 500 MG PO TABS: 500.0000 mg | ORAL_TABLET | Freq: Two times a day (BID) | ORAL | 0 refills | Status: AC

## 2022-12-13 MED ORDER — IOHEXOL 300 MG/ML  SOLN
100.0000 mL | Freq: Once | INTRAMUSCULAR | Status: AC | PRN
  Administered 2022-12-13: 100 mL via INTRAVENOUS

## 2022-12-13 MED ORDER — MORPHINE SULFATE (PF) 4 MG/ML IV SOLN
4.0000 mg | Freq: Once | INTRAVENOUS | Status: AC
  Administered 2022-12-13: 4 mg via INTRAVENOUS
  Filled 2022-12-13: qty 1

## 2022-12-13 NOTE — ED Provider Notes (Signed)
[place] EMERGENCY DEPARTMENT AT [facility] Provider Note   CSN: [id number] Arrival date & time: 12/13/22  4098     History  Chief Complaint  Patient presents with   Back Pain   Abdominal Pain    Nysa Sarin is a 82 y.o. female.  82 year old female presents with lower abdominal pain times several days after a fall.  Seen here at the time of the fall and x-ray showed chronic compression fractures.  She was fitted with a thoracic and lumbar brace.  Has been wearing that.  States she has had no recent falls.  Her back pain is unchanged.  Denies any new weakness to her lower extremities.  Has had lower abdominal discomfort that has been persistent.    Pain is characterized as sharp and worse with movement.  Does not radiate down her legs.  She has not had any emesis or fever.       Home Medications Prior to Admission medications   Medication Sig Start Date End Date Taking? Authorizing Provider  acetaminophen (TYLENOL) 325 MG tablet Take 325 mg by mouth every 6 (six) hours as needed for moderate pain.    [provider]  acetaminophen (TYLENOL) 325 MG tablet Take 2 tablets (650 mg total) by mouth every 6 (six) hours as needed for up to 30 doses for moderate pain or mild pain. 12/09/22   Terald Sleeper, MD  Cyanocobalamin (VITAMIN B-12 IJ) Inject 1 application as directed every 30 (thirty) days.    [provider]  docusate sodium (COLACE) 100 MG capsule Take 1 capsule (100 mg total) by mouth 2 (two) times daily. 11/09/19   Jerald Kief, MD  ibuprofen (ADVIL) 600 MG tablet Take 1 tablet (600 mg total) by mouth every 8 (eight) hours as needed for moderate pain or mild pain. 12/09/22 01/08/23  Terald Sleeper, MD  ipratropium-albuterol (DUONEB) 0.5-2.5 (3) MG/3ML SOLN Take 3 mLs by nebulization every 4 (four) hours as needed. Patient taking differently: Take 3 mLs by nebulization every 4 (four) hours as needed (shortness of breath.).  11/09/19   Jerald Kief, MD  metoprolol succinate (TOPROL-XL) 25 MG 24 hr tablet Take 25 mg by mouth daily.    [provider]  nitroGLYCERIN (NITROSTAT) 0.4 MG SL tablet Place 1 tablet under the tongue every 5 (five) minutes as needed for chest pain.  11/13/17   [provider]  traMADol (ULTRAM) 50 MG tablet Take 1 tablet (50 mg total) by mouth every 12 (twelve) hours as needed for up to 10 doses. 12/09/22   Terald Sleeper, MD  vitamin B-12 (CYANOCOBALAMIN) 1000 MCG tablet Take 1,000 mcg by mouth daily.    [provider]      Allergies    Vicodin [hydrocodone-acetaminophen] and Ambien [zolpidem tartrate]    Review of Systems   Review of Systems  All other systems reviewed and are negative.   Physical Exam Updated Vital Signs BP (!) 140/76 (BP Location: Left Arm)   Pulse (!) 58   Temp (!) 97.5 F (36.4 C) (Oral)   Resp 10  Physical Exam Vitals and nursing note reviewed.  Constitutional:      General: She is not in acute distress.    Appearance: Normal appearance. She is well-developed. She is not toxic-appearing.  HENT:     Head: Normocephalic and atraumatic.  Eyes:     General: Lids are normal.     Conjunctiva/sclera: Conjunctivae normal.  Pupils: Pupils are equal, round, and reactive to light.  Neck:     Thyroid: No thyroid mass.     Trachea: No tracheal deviation.  Cardiovascular:     Rate and Rhythm: Normal rate and regular rhythm.     Heart sounds: Normal heart sounds. No murmur heard.    No gallop.  Pulmonary:     Effort: Pulmonary effort is normal. No respiratory distress.     Breath sounds: Normal breath sounds. No stridor. No decreased breath sounds, wheezing, rhonchi or rales.  Abdominal:     General: There is no distension.     Palpations: Abdomen is soft.     Tenderness: There is abdominal tenderness in the right lower quadrant and left lower quadrant. There is no rebound.    Musculoskeletal:        General: No tenderness. Normal range  of motion.     Cervical back: Normal range of motion and neck supple.  Skin:    General: Skin is warm and dry.     Findings: No abrasion or rash.  Neurological:     Mental Status: She is alert and oriented to person, place, and time. Mental status is at baseline.     GCS: GCS eye subscore is 4. GCS verbal subscore is 5. GCS motor subscore is 6.     Cranial Nerves: No cranial nerve deficit.     Sensory: No sensory deficit.     Motor: Motor function is intact.     Comments: Strength is 5 out of 5 in bilateral lower extremities  Psychiatric:        Attention and Perception: Attention normal.        Speech: Speech normal.        Behavior: Behavior normal.     ED Results / Procedures / Treatments   Labs (all labs ordered are listed, but only abnormal results are displayed) Labs Reviewed - No data to display  EKG None  Radiology No results found.  Procedures Procedures    Medications Ordered in ED Medications  lactated ringers infusion (has no administration in time range)  morphine (PF) 4 MG/ML injection 4 mg (has no administration in time range)    ED Course/ Medical Decision Making/ A&P                             Medical Decision Making Amount and/or Complexity of Data Reviewed Labs: ordered. Radiology: ordered.  Risk Prescription drug management.   Patient is here with abdominal pain after recent trauma several days ago.  Concern for possible intra-abdominal process patient abdominal CT which did not show any acute findings.  Was medicated with morphine for pain prior to events.  Patient's neurological assessment is stable.  Do not feel the patient needs to have any further imaging of her spine.  Urinalysis does show some infection.  Will place on antibiotics.  Patient with mild hypokalemia of 2.7 was given 60 mill equivalents of oral potassium.  Will adjust patient's pain medication for her known spinal fractures.  Also give referral for kyphoplasty.  Patient  stable for discharge at this time        Final Clinical Impression(s) / ED Diagnoses Final diagnoses:  None    Rx / DC Orders ED Discharge Orders     None         Lorre Nick, MD 12/13/22 1139

## 2022-12-13 NOTE — ED Notes (Signed)
Patient Alert and oriented to baseline. Stable and ambulatory to baseline. Patient verbalized understanding of the discharge instructions.  Patient belongings were taken by the patient.   

## 2022-12-13 NOTE — Discharge Instructions (Addendum)
Your potassium today was 2.7 and this needs to be repeated by your doctor next week.  Call the referral number for the radiologist to evaluate you for possible kyphoplasty which is glue may help help stabilize your spinal compression fractures and improve your discomfort.  Continue to wear your back brace.

## 2022-12-13 NOTE — ED Triage Notes (Signed)
Pt bib ems from home with lower back pain after falling on Friday and being dx with several compression fx. Pt states pain suddenly worsened yesterday morning. Denies any new injury/trauma. Denies any numbness or incontinence. Pt also complains of bilateral lower abdominal pain onset after taking pain medication. LBM Friday.

## 2022-12-13 NOTE — ED Notes (Signed)
MD notified of K+
